# Patient Record
Sex: Male | Born: 1990 | Race: Black or African American | Hispanic: No | Marital: Single | Smoking: Current every day smoker
Health system: Southern US, Community
[De-identification: ages and names within clinical notes are randomized; demographics above are authoritative.]

## PROBLEM LIST (undated history)

## (undated) DIAGNOSIS — B019 Varicella without complication: Secondary | ICD-10-CM

## (undated) DIAGNOSIS — B20 Human immunodeficiency virus [HIV] disease: Secondary | ICD-10-CM

## (undated) DIAGNOSIS — A539 Syphilis, unspecified: Secondary | ICD-10-CM

## (undated) DIAGNOSIS — K603 Anal fistula, unspecified: Secondary | ICD-10-CM

## (undated) DIAGNOSIS — J45909 Unspecified asthma, uncomplicated: Secondary | ICD-10-CM

## (undated) DIAGNOSIS — K649 Unspecified hemorrhoids: Secondary | ICD-10-CM

## (undated) DIAGNOSIS — R040 Epistaxis: Secondary | ICD-10-CM

## (undated) DIAGNOSIS — Z21 Asymptomatic human immunodeficiency virus [HIV] infection status: Secondary | ICD-10-CM

## (undated) DIAGNOSIS — F411 Generalized anxiety disorder: Secondary | ICD-10-CM

## (undated) DIAGNOSIS — K602 Anal fissure, unspecified: Secondary | ICD-10-CM

## (undated) DIAGNOSIS — K219 Gastro-esophageal reflux disease without esophagitis: Secondary | ICD-10-CM

## (undated) DIAGNOSIS — I10 Essential (primary) hypertension: Secondary | ICD-10-CM

## (undated) DIAGNOSIS — K626 Ulcer of anus and rectum: Secondary | ICD-10-CM

## (undated) HISTORY — DX: Gastro-esophageal reflux disease without esophagitis: K21.9

## (undated) HISTORY — DX: Varicella without complication: B01.9

## (undated) HISTORY — DX: Epistaxis: R04.0

## (undated) HISTORY — DX: Human immunodeficiency virus (HIV) disease: B20

## (undated) HISTORY — DX: Essential (primary) hypertension: I10

## (undated) HISTORY — DX: Ulcer of anus and rectum: K62.6

## (undated) HISTORY — DX: Anal fistula, unspecified: K60.30

## (undated) HISTORY — DX: Unspecified asthma, uncomplicated: J45.909

## (undated) HISTORY — DX: Generalized anxiety disorder: F41.1

## (undated) HISTORY — DX: Anal fistula: K60.3

## (undated) HISTORY — DX: Asymptomatic human immunodeficiency virus (hiv) infection status: Z21

## (undated) HISTORY — DX: Anal fissure, unspecified: K60.2

## (undated) HISTORY — DX: Unspecified hemorrhoids: K64.9

## (undated) HISTORY — DX: Syphilis, unspecified: A53.9

---
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
## 2011-04-09 ENCOUNTER — Ambulatory Visit (INDEPENDENT_AMBULATORY_CARE_PROVIDER_SITE_OTHER): Payer: 59

## 2011-04-09 DIAGNOSIS — Z23 Encounter for immunization: Secondary | ICD-10-CM

## 2011-12-30 ENCOUNTER — Ambulatory Visit: Payer: 59 | Admitting: Internal Medicine

## 2011-12-30 DIAGNOSIS — Z0289 Encounter for other administrative examinations: Secondary | ICD-10-CM

## 2012-01-08 ENCOUNTER — Ambulatory Visit: Payer: 59 | Admitting: Internal Medicine

## 2012-01-08 DIAGNOSIS — Z0289 Encounter for other administrative examinations: Secondary | ICD-10-CM

## 2012-03-10 ENCOUNTER — Telehealth: Payer: Self-pay | Admitting: Internal Medicine

## 2012-03-10 ENCOUNTER — Encounter: Payer: Self-pay | Admitting: Internal Medicine

## 2012-03-10 ENCOUNTER — Ambulatory Visit (INDEPENDENT_AMBULATORY_CARE_PROVIDER_SITE_OTHER): Payer: 59 | Admitting: Internal Medicine

## 2012-03-10 VITALS — BP 170/100 | HR 155 | Temp 97.8°F | Ht 70.0 in | Wt 154.5 lb

## 2012-03-10 DIAGNOSIS — F411 Generalized anxiety disorder: Secondary | ICD-10-CM

## 2012-03-10 MED ORDER — SERTRALINE HCL 50 MG PO TABS: 50.0000 mg | ORAL_TABLET | Freq: Every day | ORAL | Status: DC

## 2012-03-10 NOTE — Telephone Encounter (Signed)
Caller Vernona Rieger for [employer]; DOB 15-Nov-1990;  PCP Illene Regulus;  Callback number [phone]; Onset of anxiety about 6-12 months ago and progressively getting worse. Patient states he gets physical symptoms of shortness of breath, chest pain, and dizziness.  Triggers include being in a car for a long time, in traffic, loud sounds, being late to something or rushed.  Is having some life stressors now.  States he is able to work his job and attend school.  Triaged using Anxiety: Panic with a disposition  to see provider within 24 hours due to experiencing multiple daily episodes of severe anxiety or panic.  Care advice given-appointment made with Dr. Debby Bud at 16:15 on 03/10/12.  Patient stated his understanding.

## 2012-03-10 NOTE — Patient Instructions (Addendum)
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 PLan Drugs - will start sertraline 50 mg once a day  Counseling - looking for triggers, causes and solutions for the long term. Call [phone] to see Dr. Dellia Cloud with whom you are established for some short term counseling.

## 2012-03-12 DIAGNOSIS — F411 Generalized anxiety disorder: Secondary | ICD-10-CM | POA: Insufficient documentation

## 2012-03-12 NOTE — Progress Notes (Signed)
  Subjective:    Patient ID: Harold Flores, male    DOB: January 18, 1991, 21 y.o.   MRN: [id number]  HPI [name] was last seen approximately two years ago. In the interval he has been in college and has been living independently. He presents today for progressive anxiety and progressively more frequent panic attacks- up to several times a day. He has classic symptoms: sweats and feeling hot, sense of impending doom. He can calm himself with deep breathing but not knowing when the next attack will occur is unnerving.  Past Med Hx, Fm Hx, Soc Hx reviewed in centricity.  Current Outpatient Prescriptions on File Prior to Visit  Medication Sig Dispense Refill  . sertraline (ZOLOFT) 50 MG tablet Take 1 tablet (50 mg total) by mouth daily.  30 tablet  3     Review of Systems System review is negative for any constitutional, cardiac, pulmonary, GI or neuro symptoms or complaints other than as described in the HPI.     Objective:   Physical Exam Filed Vitals:   03/10/12 1656  BP: 170/100  Pulse: 155  Temp: 97.8 F (36.6 C)   Gen'l- WNWD AA man in no distress but anxiou HEENT- C&S clear PERRLA Cor- RRR Pulm - normal respirations Psych- at exam he is oriented, calm, conversant.       Assessment & Plan:

## 2012-03-12 NOTE — Assessment & Plan Note (Signed)
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 PLan Drugs - will start sertraline 50 mg once a day  Counseling - looking for triggers, causes and solutions for the long term. Call [phone] to see Dr. Dellia Cloud with whom he is established for some short term counseling.   ROV 1 month for medication monitoring

## 2012-03-18 ENCOUNTER — Telehealth: Payer: Self-pay | Admitting: Internal Medicine

## 2012-03-18 NOTE — Telephone Encounter (Signed)
Caller: [name]/Patient; Patient Name: Harold Flores; PCP: Illene Regulus (Adults only); Best Callback Phone Number: [phone]; Reason for call: Patient states he came in last week 03/10/12 to see Dr. Debby Bud.   He was diagnosed with Generalized Anxiety and placed on Zoloft. He states in addition to being diagnosed , he has stopped smoking . No cigarette in the last 4 days.  He states he does feel better but the anxiety went from his arm pain a  "heart atttack" feeling and has now moved to his head . He feels like he has pressure in his head and fuzziness. Voice is clear, calm and purposeful. Emergent s/sx ruled out per Anxiety Protocol with excpetion to "Increasing physical Symptoms related to Anxiety" . See provider in 72 hours. Care advice and Guidelines reviewed with patient. Advised I would forward concerns to Dr. Debby Bud. Patient may need medication adjustment or change . Advised I would have office follow up . Understanding expressed. Encouraged to call back for questions, changes or concerns. DOES DR. Alvera Novel  WANT TO ADJUST MEDICATION OR SEE PATIENT???

## 2012-03-19 ENCOUNTER — Encounter: Payer: Self-pay | Admitting: Internal Medicine

## 2012-03-19 ENCOUNTER — Ambulatory Visit (INDEPENDENT_AMBULATORY_CARE_PROVIDER_SITE_OTHER): Payer: 59 | Admitting: Internal Medicine

## 2012-03-19 VITALS — BP 134/86 | HR 93 | Temp 98.9°F | Resp 16 | Wt 153.0 lb

## 2012-03-19 DIAGNOSIS — F411 Generalized anxiety disorder: Secondary | ICD-10-CM

## 2012-03-19 DIAGNOSIS — Z23 Encounter for immunization: Secondary | ICD-10-CM

## 2012-03-19 NOTE — Telephone Encounter (Signed)
Pt scheduled, thanks! 

## 2012-03-19 NOTE — Telephone Encounter (Signed)
May add on this PM 

## 2012-03-20 ENCOUNTER — Encounter: Payer: Self-pay | Admitting: *Deleted

## 2012-03-23 NOTE — Assessment & Plan Note (Signed)
Doing better on Zoloft. No evidence of any drug reaction.  Plan  Increase Zoloft to 100 mg daily.

## 2012-03-23 NOTE — Progress Notes (Signed)
  Subjective:    Patient ID: Harold Flores, male    DOB: 05/22/1991, 21 y.o.   MRN: [id number]  HPI [name] was recently seen for anxiety with panic and started on Zoloft 50 mg once a day. He reports that he has fewer panic attacks but does have a pressure feeling in his head associated with anxiety. He was asked to come in to be sure he was not having a drug reaction.  PMH, FamHx and SocHx reviewed for any changes and relevance.  Medication - reviewed   Review of Systems System review is negative for any constitutional, cardiac, pulmonary, GI or neuro symptoms or complaints other than as described in the HPI.     Objective:   Physical Exam Filed Vitals:   03/19/12 1713  BP: 134/86  Pulse: 93  Temp: 98.9 F (37.2 C)  Resp: 16   Cor- RRR Pulm - normal Neuro - non-focal       Assessment & Plan:

## 2012-03-30 ENCOUNTER — Other Ambulatory Visit: Payer: Self-pay | Admitting: *Deleted

## 2012-03-30 MED ORDER — SERTRALINE HCL 100 MG PO TABS: 100.0000 mg | ORAL_TABLET | Freq: Every day | ORAL | Status: DC

## 2012-03-30 NOTE — Telephone Encounter (Signed)
Medication change to Sertraline 100mg  daily sent to [facility]

## 2012-04-09 ENCOUNTER — Ambulatory Visit (INDEPENDENT_AMBULATORY_CARE_PROVIDER_SITE_OTHER): Payer: 59 | Admitting: Psychology

## 2012-04-09 DIAGNOSIS — F4320 Adjustment disorder, unspecified: Secondary | ICD-10-CM

## 2012-04-23 ENCOUNTER — Ambulatory Visit: Payer: Self-pay | Admitting: Psychology

## 2012-05-06 ENCOUNTER — Ambulatory Visit (INDEPENDENT_AMBULATORY_CARE_PROVIDER_SITE_OTHER): Payer: 59 | Admitting: Psychology

## 2012-05-06 DIAGNOSIS — F4320 Adjustment disorder, unspecified: Secondary | ICD-10-CM

## 2012-05-22 ENCOUNTER — Ambulatory Visit: Payer: Self-pay | Admitting: Psychology

## 2012-09-24 ENCOUNTER — Other Ambulatory Visit: Payer: Self-pay | Admitting: Internal Medicine

## 2012-09-24 NOTE — Telephone Encounter (Signed)
Patient states he has been using Prevadid Solutabs and was wondering if there is a RX that would be stronger and offer more relief.

## 2012-09-24 NOTE — Telephone Encounter (Signed)
OK Preacid Rx x 1 mo OV w/any provider next wk Thx

## 2012-09-25 NOTE — Telephone Encounter (Signed)
Patient advised that Rx for Prevacid 30mg  was sent to [facility].  He will call back next week for appointment.

## 2012-09-30 ENCOUNTER — Telehealth: Payer: Self-pay

## 2012-09-30 NOTE — Telephone Encounter (Signed)
Phone call to pt letting him know I spoke with [facility] giving the okay for the rx for Prevacid 30 mg capsules.

## 2012-10-08 ENCOUNTER — Encounter: Payer: Self-pay | Admitting: Internal Medicine

## 2012-10-08 ENCOUNTER — Other Ambulatory Visit (INDEPENDENT_AMBULATORY_CARE_PROVIDER_SITE_OTHER): Payer: 59

## 2012-10-08 ENCOUNTER — Ambulatory Visit (INDEPENDENT_AMBULATORY_CARE_PROVIDER_SITE_OTHER): Payer: 59 | Admitting: Internal Medicine

## 2012-10-08 VITALS — BP 150/86 | HR 98 | Temp 97.7°F | Resp 16 | Ht 70.0 in | Wt 165.0 lb

## 2012-10-08 DIAGNOSIS — R599 Enlarged lymph nodes, unspecified: Secondary | ICD-10-CM

## 2012-10-08 DIAGNOSIS — I10 Essential (primary) hypertension: Secondary | ICD-10-CM | POA: Insufficient documentation

## 2012-10-08 DIAGNOSIS — A630 Anogenital (venereal) warts: Secondary | ICD-10-CM

## 2012-10-08 DIAGNOSIS — Z23 Encounter for immunization: Secondary | ICD-10-CM

## 2012-10-08 DIAGNOSIS — F411 Generalized anxiety disorder: Secondary | ICD-10-CM

## 2012-10-08 DIAGNOSIS — A5139 Other secondary syphilis of skin: Secondary | ICD-10-CM

## 2012-10-08 DIAGNOSIS — J4530 Mild persistent asthma, uncomplicated: Secondary | ICD-10-CM

## 2012-10-08 DIAGNOSIS — Z0001 Encounter for general adult medical examination with abnormal findings: Secondary | ICD-10-CM | POA: Insufficient documentation

## 2012-10-08 DIAGNOSIS — Z0000 Encounter for general adult medical examination without abnormal findings: Secondary | ICD-10-CM | POA: Insufficient documentation

## 2012-10-08 DIAGNOSIS — R590 Localized enlarged lymph nodes: Secondary | ICD-10-CM

## 2012-10-08 DIAGNOSIS — J45909 Unspecified asthma, uncomplicated: Secondary | ICD-10-CM

## 2012-10-08 LAB — URINALYSIS, ROUTINE W REFLEX MICROSCOPIC
Bilirubin Urine: NEGATIVE
Hgb urine dipstick: NEGATIVE
Ketones, ur: NEGATIVE
Leukocytes, UA: NEGATIVE
Nitrite: NEGATIVE
Specific Gravity, Urine: 1.015 (ref 1.000–1.030)
Urine Glucose: NEGATIVE
Urobilinogen, UA: 1 (ref 0.0–1.0)
pH: 7.5 (ref 5.0–8.0)

## 2012-10-08 LAB — CBC WITH DIFFERENTIAL/PLATELET
Basophils Absolute: 0 10*3/uL (ref 0.0–0.1)
Basophils Relative: 0.5 % (ref 0.0–3.0)
Eosinophils Absolute: 0.2 10*3/uL (ref 0.0–0.7)
Eosinophils Relative: 2.7 % (ref 0.0–5.0)
HCT: 41.8 % (ref 39.0–52.0)
Hemoglobin: 14.1 g/dL (ref 13.0–17.0)
Lymphocytes Relative: 35.6 % (ref 12.0–46.0)
Lymphs Abs: 2.6 10*3/uL (ref 0.7–4.0)
MCHC: 33.6 g/dL (ref 30.0–36.0)
MCV: 77.8 fl — ABNORMAL LOW (ref 78.0–100.0)
Monocytes Absolute: 1.1 10*3/uL — ABNORMAL HIGH (ref 0.1–1.0)
Monocytes Relative: 14.7 % — ABNORMAL HIGH (ref 3.0–12.0)
Neutro Abs: 3.4 10*3/uL (ref 1.4–7.7)
Neutrophils Relative %: 46.5 % (ref 43.0–77.0)
Platelets: 295 10*3/uL (ref 150.0–400.0)
RBC: 5.37 Mil/uL (ref 4.22–5.81)
RDW: 14.6 % (ref 11.5–14.6)
WBC: 7.2 10*3/uL (ref 4.5–10.5)

## 2012-10-08 LAB — COMPREHENSIVE METABOLIC PANEL
ALT: 13 U/L (ref 0–53)
AST: 16 U/L (ref 0–37)
Albumin: 4.2 g/dL (ref 3.5–5.2)
Alkaline Phosphatase: 106 U/L (ref 39–117)
BUN: 8 mg/dL (ref 6–23)
CO2: 29 mEq/L (ref 19–32)
Calcium: 9.3 mg/dL (ref 8.4–10.5)
Chloride: 102 mEq/L (ref 96–112)
Creatinine, Ser: 0.8 mg/dL (ref 0.4–1.5)
GFR: 154.37 mL/min (ref >60.00)
Glucose, Bld: 96 mg/dL (ref 70–99)
Potassium: 3.9 mEq/L (ref 3.5–5.1)
Sodium: 136 mEq/L (ref 135–145)
Total Bilirubin: 1.1 mg/dL (ref 0.3–1.2)
Total Protein: 7.5 g/dL (ref 6.0–8.3)

## 2012-10-08 LAB — LIPID PANEL
Cholesterol: 116 mg/dL (ref 0–200)
HDL: 33.1 mg/dL — ABNORMAL LOW (ref >39.00)
LDL Cholesterol: 76 mg/dL (ref 0–99)
Total CHOL/HDL Ratio: 4
Triglycerides: 37 mg/dL (ref 0.0–149.0)
VLDL: 7.4 mg/dL (ref 0.0–40.0)

## 2012-10-08 LAB — RPR TITER: RPR Titer: 1:16 {titer} — AB

## 2012-10-08 LAB — TSH: TSH: 1.36 u[IU]/mL (ref 0.35–5.50)

## 2012-10-08 LAB — HIV ANTIBODY (ROUTINE TESTING W REFLEX): HIV: NONREACTIVE

## 2012-10-08 LAB — RPR: RPR Ser Ql: REACTIVE — AB

## 2012-10-08 MED ORDER — BECLOMETHASONE DIPROPIONATE 80 MCG/ACT IN AERS: 1.0000 | INHALATION_SPRAY | Freq: Two times a day (BID) | RESPIRATORY_TRACT | Status: DC

## 2012-10-08 MED ORDER — NEBIVOLOL HCL 5 MG PO TABS: 5.0000 mg | ORAL_TABLET | Freq: Every day | ORAL | Status: DC

## 2012-10-08 NOTE — Patient Instructions (Signed)
Hypertension As your heart beats, it forces blood through your arteries. This force is your blood pressure. If the pressure is too high, it is called hypertension (HTN) or high blood pressure. HTN is dangerous because you may have it and not know it. High blood pressure may mean that your heart has to work harder to pump blood. Your arteries may be narrow or stiff. The extra work puts you at risk for heart disease, stroke, and other problems.  Blood pressure consists of two numbers, a higher number over a lower, 110/72, for example. It is stated as "110 over 72." The ideal is below 120 for the top number (systolic) and under 80 for the bottom (diastolic). Write down your blood pressure today. You should pay close attention to your blood pressure if you have certain conditions such as:  Heart failure.  Prior heart attack.  Diabetes  Chronic kidney disease.  Prior stroke.  Multiple risk factors for heart disease. To see if you have HTN, your blood pressure should be measured while you are seated with your arm held at the level of the heart. It should be measured at least twice. A one-time elevated blood pressure reading (especially in the Emergency Department) does not mean that you need treatment. There may be conditions in which the blood pressure is different between your right and left arms. It is important to see your caregiver soon for a recheck. Most people have essential hypertension which means that there is not a specific cause. This type of high blood pressure may be lowered by changing lifestyle factors such as:  Stress.  Smoking.  Lack of exercise.  Excessive weight.  Drug/tobacco/alcohol use.  Eating less salt. Most people do not have symptoms from high blood pressure until it has caused damage to the body. Effective treatment can often prevent, delay or reduce that damage. TREATMENT  When a cause has been identified, treatment for high blood pressure is directed at the  cause. There are a large number of medications to treat HTN. These fall into several categories, and your caregiver will help you select the medicines that are best for you. Medications may have side effects. You should review side effects with your caregiver. If your blood pressure stays high after you have made lifestyle changes or started on medicines,   Your medication(s) may need to be changed.  Other problems may need to be addressed.  Be certain you understand your prescriptions, and know how and when to take your medicine.  Be sure to follow up with your caregiver within the time frame advised (usually within two weeks) to have your blood pressure rechecked and to review your medications.  If you are taking more than one medicine to lower your blood pressure, make sure you know how and at what times they should be taken. Taking two medicines at the same time can result in blood pressure that is too low. SEEK IMMEDIATE MEDICAL CARE IF:  You develop a severe headache, blurred or changing vision, or confusion.  You have unusual weakness or numbness, or a faint feeling.  You have severe chest or abdominal pain, vomiting, or breathing problems. MAKE SURE YOU:   Understand these instructions.  Will watch your condition.  Will get help right away if you are not doing well or get worse. Document Released: 05/27/2005 Document Revised: 08/19/2011 Document Reviewed: 01/15/2008 [employer] Patient Information 2013 [employer]. Health Maintenance, Males A healthy lifestyle and preventative care can promote health and wellness.  Maintain   regular health, dental, and eye exams.  Eat a healthy diet. Foods like vegetables, fruits, whole grains, low-fat dairy products, and lean protein foods contain the nutrients you need without too many calories. Decrease your intake of foods high in solid fats, added sugars, and salt. Get information about a proper diet from your caregiver, if  necessary.  Regular physical exercise is one of the most important things you can do for your health. Most adults should get at least 150 minutes of moderate-intensity exercise (any activity that increases your heart rate and causes you to sweat) each week. In addition, most adults need muscle-strengthening exercises on 2 or more days a week.   Maintain a healthy weight. The body mass index (BMI) is a screening tool to identify possible weight problems. It provides an estimate of body fat based on height and weight. Your caregiver can help determine your BMI, and can help you achieve or maintain a healthy weight. For adults 20 years and older:  A BMI below 18.5 is considered underweight.  A BMI of 18.5 to 24.9 is normal.  A BMI of 25 to 29.9 is considered overweight.  A BMI of 30 and above is considered obese.  Maintain normal blood lipids and cholesterol by exercising and minimizing your intake of saturated fat. Eat a balanced diet with plenty of fruits and vegetables. Blood tests for lipids and cholesterol should begin at age 20 and be repeated every 5 years. If your lipid or cholesterol levels are high, you are over 50, or you are a high risk for heart disease, you may need your cholesterol levels checked more frequently.Ongoing high lipid and cholesterol levels should be treated with medicines, if diet and exercise are not effective.  If you smoke, find out from your caregiver how to quit. If you do not use tobacco, do not start.  If you choose to drink alcohol, do not exceed 2 drinks per day. One drink is considered to be 12 ounces (355 mL) of beer, 5 ounces (148 mL) of wine, or 1.5 ounces (44 mL) of liquor.  Avoid use of street drugs. Do not share needles with anyone. Ask for help if you need support or instructions about stopping the use of drugs.  High blood pressure causes heart disease and increases the risk of stroke. Blood pressure should be checked at least every 1 to 2 years.  Ongoing high blood pressure should be treated with medicines if weight loss and exercise are not effective.  If you are 45 to 21 years old, ask your caregiver if you should take aspirin to prevent heart disease.  Diabetes screening involves taking a blood sample to check your fasting blood sugar level. This should be done once every 3 years, after age 45, if you are within normal weight and without risk factors for diabetes. Testing should be considered at a younger age or be carried out more frequently if you are overweight and have at least 1 risk factor for diabetes.  Colorectal cancer can be detected and often prevented. Most routine colorectal cancer screening begins at the age of 50 and continues through age 75. However, your caregiver may recommend screening at an earlier age if you have risk factors for colon cancer. On a yearly basis, your caregiver may provide home test kits to check for hidden blood in the stool. Use of a small camera at the end of a tube, to directly examine the colon (sigmoidoscopy or colonoscopy), can detect the earliest forms of colorectal   cancer. Talk to your caregiver about this at age 50, when routine screening begins. Direct examination of the colon should be repeated every 5 to 10 years through age 75, unless early forms of pre-cancerous polyps or small growths are found.  Hepatitis C blood testing is recommended for all people born from 1945 through 1965 and any individual with known risks for hepatitis C.  Healthy men should no longer receive prostate-specific antigen (PSA) blood tests as part of routine cancer screening. Consult with your caregiver about prostate cancer screening.  Testicular cancer screening is not recommended for adolescents or adult males who have no symptoms. Screening includes self-exam, caregiver exam, and other screening tests. Consult with your caregiver about any symptoms you have or any concerns you have about testicular  cancer.  Practice safe sex. Use condoms and avoid high-risk sexual practices to reduce the spread of sexually transmitted infections (STIs).  Use sunscreen with a sun protection factor (SPF) of 30 or greater. Apply sunscreen liberally and repeatedly throughout the day. You should seek shade when your shadow is shorter than you. Protect yourself by wearing long sleeves, pants, a wide-brimmed hat, and sunglasses year round, whenever you are outdoors.  Notify your caregiver of new moles or changes in moles, especially if there is a change in shape or color. Also notify your caregiver if a mole is larger than the size of a pencil eraser.  A one-time screening for abdominal aortic aneurysm (AAA) and surgical repair of large AAAs by sound wave imaging (ultrasonography) is recommended for ages 65 to 75 years who are current or former smokers.  Stay current with your immunizations. Document Released: 11/23/2007 Document Revised: 08/19/2011 Document Reviewed: 10/22/2010 [employer] Patient Information 2013 [employer].  

## 2012-10-08 NOTE — Progress Notes (Signed)
Subjective:    Patient ID: Harold Flores, male    DOB: 11/02/1990, 22 y.o.   MRN: [id number]  Hypertension This is a new problem. The problem is unchanged. Associated symptoms include anxiety. Pertinent negatives include no blurred vision, chest pain, headaches, malaise/fatigue, neck pain, orthopnea, palpitations, peripheral edema, PND, shortness of breath or sweats. There are no associated agents to hypertension. Past treatments include nothing.  Asthma He complains of chest tightness and wheezing. There is no cough, difficulty breathing, frequent throat clearing, hemoptysis, hoarse voice, shortness of breath or sputum production. This is a recurrent problem. The current episode started more than 1 year ago. The problem occurs intermittently. The problem has been unchanged. Associated symptoms include heartburn. Pertinent negatives include no appetite change, chest pain, dyspnea on exertion, ear congestion, ear pain, fever, headaches, malaise/fatigue, myalgias, nasal congestion, orthopnea, PND, postnasal drip, rhinorrhea, sneezing, sore throat, sweats, trouble swallowing or weight loss. His symptoms are alleviated by beta-agonist. He reports significant improvement on treatment. His past medical history is significant for asthma.      Review of Systems  Constitutional: Negative.  Negative for fever, chills, weight loss, malaise/fatigue, diaphoresis, activity change, appetite change, fatigue and unexpected weight change.  HENT: Negative.  Negative for ear pain, sore throat, hoarse voice, rhinorrhea, sneezing, trouble swallowing, neck pain and postnasal drip.   Eyes: Negative.  Negative for blurred vision.  Respiratory: Positive for wheezing. Negative for apnea, cough, hemoptysis, sputum production, choking, chest tightness, shortness of breath and stridor.   Cardiovascular: Negative for chest pain, dyspnea on exertion, palpitations, orthopnea, leg swelling and PND.  Gastrointestinal: Positive  for heartburn. Negative for nausea, vomiting, abdominal pain, diarrhea and constipation.  Endocrine: Negative.   Genitourinary: Negative.  Negative for dysuria, urgency, decreased urine volume, scrotal swelling, difficulty urinating and testicular pain.  Musculoskeletal: Negative.  Negative for myalgias, back pain, joint swelling, arthralgias and gait problem.  Skin: Negative.   Allergic/Immunologic: Negative.  Negative for environmental allergies, food allergies and immunocompromised state.  Neurological: Negative.  Negative for headaches.  Hematological: Negative.  Negative for adenopathy. Does not bruise/bleed easily.  Psychiatric/Behavioral: Negative for suicidal ideas, hallucinations, behavioral problems, confusion, sleep disturbance, self-injury, dysphoric mood, decreased concentration and agitation. The patient is nervous/anxious. The patient is not hyperactive.        Objective:   Physical Exam  Vitals reviewed. Constitutional: He is oriented to person, place, and time. He appears well-developed and well-nourished. No distress.  HENT:  Head: Normocephalic and atraumatic.  Mouth/Throat: Oropharynx is clear and moist. No oropharyngeal exudate.  Eyes: Conjunctivae are normal. Right eye exhibits no discharge. Left eye exhibits no discharge. No scleral icterus.  Neck: Normal range of motion. Neck supple. No JVD present. No tracheal deviation present. No thyromegaly present.  Cardiovascular: Normal rate, regular rhythm, normal heart sounds and intact distal pulses.  Exam reveals no gallop and no friction rub.   No murmur heard. Pulmonary/Chest: Effort normal and breath sounds normal. No stridor. No respiratory distress. He has no wheezes. He has no rales. He exhibits no tenderness.  Abdominal: Soft. Bowel sounds are normal. He exhibits no distension and no mass. There is no tenderness. There is no rebound and no guarding. Hernia confirmed negative in the right inguinal area and confirmed  negative in the left inguinal area.  Genitourinary: Penis normal. Right testis shows no mass, no swelling and no tenderness. Right testis is descended. Left testis shows no mass, no swelling and no tenderness. Left testis is descended. Circumcised. No penile  erythema or penile tenderness. No discharge found.  Musculoskeletal: Normal range of motion. He exhibits no edema and no tenderness.  Lymphadenopathy:    He has no cervical adenopathy.       Right: Inguinal (shoddy, insignificant) adenopathy present.       Left: Inguinal (shoddy,insignificant) adenopathy present.  Neurological: He is oriented to person, place, and time.  Skin: Skin is warm and dry. Rash (macules on his palms) noted. He is not diaphoretic. No erythema. No pallor.  Psychiatric: He has a normal mood and affect. His behavior is normal. Judgment and thought content normal.     Lab Results  Component Value Date   WBC 23.6 Repeated and verified X2. K/uL* 03/08/2010   HGB 12.8* 03/08/2010   HCT 38.2* 03/08/2010   PLT 347.0 03/08/2010   GLUCOSE 112* 03/08/2010   NA 138 03/08/2010   K 5.6* 03/08/2010   CL 97 03/08/2010   CREATININE 1.0 03/08/2010   BUN 9 03/08/2010   CO2 31 03/08/2010   HGBA1C 5.6 06/12/2009       Assessment & Plan:

## 2012-10-09 ENCOUNTER — Telehealth: Payer: Self-pay

## 2012-10-09 ENCOUNTER — Ambulatory Visit: Payer: 59

## 2012-10-09 ENCOUNTER — Ambulatory Visit (INDEPENDENT_AMBULATORY_CARE_PROVIDER_SITE_OTHER): Payer: 59 | Admitting: Internal Medicine

## 2012-10-09 ENCOUNTER — Encounter: Payer: Self-pay | Admitting: Internal Medicine

## 2012-10-09 VITALS — BP 120/80 | HR 64 | Temp 98.4°F | Resp 16 | Wt 165.0 lb

## 2012-10-09 DIAGNOSIS — A515 Early syphilis, latent: Secondary | ICD-10-CM

## 2012-10-09 DIAGNOSIS — A530 Latent syphilis, unspecified as early or late: Secondary | ICD-10-CM | POA: Insufficient documentation

## 2012-10-09 LAB — T.PALLIDUM AB, TOTAL: T pallidum Antibodies (TP-PA): >8 S/CO — ABNORMAL HIGH (ref <0.90)

## 2012-10-09 LAB — GC/CHLAMYDIA PROBE AMP, URINE
Chlamydia, Swab/Urine, PCR: NEGATIVE
GC Probe Amp, Urine: NEGATIVE

## 2012-10-09 MED ORDER — PENICILLIN G BENZATHINE 2400000 UNIT/4ML IM SUSP
2.4000 [IU] | Freq: Once | INTRAMUSCULAR | Status: AC
  Administered 2012-10-09: 2.4 [IU] via INTRAMUSCULAR

## 2012-10-09 NOTE — Assessment & Plan Note (Signed)
Exam done Vaccines were updated Labs ordered Pt ed material was given 

## 2012-10-09 NOTE — Assessment & Plan Note (Signed)
Labs ordered.

## 2012-10-09 NOTE — Assessment & Plan Note (Signed)
Start QVAR

## 2012-10-09 NOTE — Assessment & Plan Note (Signed)
I will check his labs today to look for secondary causes and end organ damage He will start bystolic to lower the BP

## 2012-10-09 NOTE — Progress Notes (Signed)
  Subjective:    Patient ID: Harold Flores, male    DOB: 12-12-1990, 22 y.o.   MRN: [id number]  Rash This is a new problem. The current episode started in the past 7 days. The problem is unchanged. Pain location: palms. The rash is characterized by redness. Pertinent negatives include no anorexia, congestion, cough, diarrhea, eye pain, facial edema, fatigue, fever, joint pain, nail changes, rhinorrhea, shortness of breath, sore throat or vomiting. Past treatments include nothing.      Review of Systems  Constitutional: Negative.  Negative for fever, chills and fatigue.  HENT: Negative.  Negative for congestion, sore throat and rhinorrhea.   Eyes: Negative.  Negative for pain.  Respiratory: Negative for cough, choking, shortness of breath, wheezing and stridor.   Cardiovascular: Negative.  Negative for chest pain, palpitations and leg swelling.  Gastrointestinal: Negative.  Negative for vomiting, diarrhea and anorexia.  Endocrine: Negative.   Genitourinary: Negative.   Musculoskeletal: Negative.  Negative for joint pain.  Skin: Positive for rash. Negative for nail changes.  Allergic/Immunologic: Negative.   Neurological: Negative.   Hematological: Negative for adenopathy. Does not bruise/bleed easily.  Psychiatric/Behavioral: Negative.        Objective:   Physical Exam  Vitals reviewed. Constitutional: He is oriented to person, place, and time. He appears well-developed and well-nourished. No distress.  HENT:  Head: Normocephalic and atraumatic.  Mouth/Throat: Oropharynx is clear and moist. No oropharyngeal exudate.  Eyes: Conjunctivae are normal. Right eye exhibits no discharge. Left eye exhibits no discharge. No scleral icterus.  Neck: Normal range of motion. Neck supple. No JVD present. No tracheal deviation present. No thyromegaly present.  Cardiovascular: Normal rate, regular rhythm, normal heart sounds and intact distal pulses.  Exam reveals no gallop and no friction rub.    No murmur heard. Pulmonary/Chest: Effort normal and breath sounds normal. No stridor. No respiratory distress. He has no wheezes. He has no rales. He exhibits no tenderness.  Abdominal: Soft. Bowel sounds are normal. He exhibits no distension and no mass. There is no tenderness. There is no rebound and no guarding.  Musculoskeletal: Normal range of motion. He exhibits no tenderness.  Lymphadenopathy:    He has no cervical adenopathy.  Neurological: He is oriented to person, place, and time.  Skin: Skin is warm, dry and intact. Rash noted. No purpura noted. Rash is macular. Rash is not papular, not maculopapular, not nodular, not pustular, not vesicular and not urticarial. He is not diaphoretic. No pallor.  He has hyperpigmented macules on his palms  Psychiatric: He has a normal mood and affect. His behavior is normal. Judgment and thought content normal.     Lab Results  Component Value Date   WBC 7.2 10/08/2012   HGB 14.1 10/08/2012   HCT 41.8 10/08/2012   PLT 295.0 10/08/2012   GLUCOSE 96 10/08/2012   CHOL 116 10/08/2012   TRIG 37.0 10/08/2012   HDL 33.10* 10/08/2012   LDLCALC 76 10/08/2012   ALT 13 10/08/2012   AST 16 10/08/2012   NA 136 10/08/2012   K 3.9 10/08/2012   CL 102 10/08/2012   CREATININE 0.8 10/08/2012   BUN 8 10/08/2012   CO2 29 10/08/2012   TSH 1.36 10/08/2012   HGBA1C 5.6 06/12/2009       Assessment & Plan:

## 2012-10-09 NOTE — Assessment & Plan Note (Signed)
Will start treating today with Bicillin LA He will receive 2.4 million units IM q week for 3 weeks

## 2012-10-09 NOTE — Telephone Encounter (Signed)
Opened in error

## 2012-10-09 NOTE — Assessment & Plan Note (Signed)
He was notified and will RTC to start Bicillin

## 2012-10-09 NOTE — Assessment & Plan Note (Signed)
-  Continue zoloft  

## 2012-10-09 NOTE — Patient Instructions (Signed)
Syphilis, Females and Males Syphilis is an infection. Syphilis can be treated with medicines that kill germs (antibiotics). It is necessary that all your sexual partners also be tested for infection and possibly be treated. CAUSES  Syphilis is caused by a germ (bacteria) called Treponema pallidum. This infection is most commonly spread by sexual contact. The bacteria from an infected person is passed to another person through minor cuts or scrapes in the skin or mucous membranes. This infection may also be passed to a fetus through the blood of the pregnant and infected mother. This is very serious. It can cause deformities and may be life-threatening to the newborn. SYMPTOMS  Syphilis has 3 symptom stages:  Primary syphilis. The first stage involves painless sores (chancres) in and around the genital organs and mouth. Lymph nodes near the sores may also be swollen. Both the sores and swollen lymph nodes can disappear even if you are not treated. However, you will still have the infection in your body and can pass it to another person.  Secondary syphilis. The second stage involves a rash or sores over any portion of the body, including the palms of the hands and soles of the feet. Other symptoms commonly occur, including fever, new sores in the mouth or on the genitals, feeling generally ill, and having pain in the joints. These symptoms can also disappear with no treatment. However, you will still have the infection and can pass it to another person.  Tertiary syphilis. The third stage of this illness involves major and severe damage to different organs in the body. This includes damage to the brain leading to dementia and damage to the spinal cord leading to difficulty walking. It also includes damage to the heart and aorta. This may cause heart failure, fainting, or development of an enlargement (aneurysm) of the aorta. At any time after infection, it is possible to have no symptoms or signs of this  disease. This is called latent syphilis. The only way to prove the presence of latent syphilis is by testing your blood for the infection. DIAGNOSIS  Depending on the stage of illness, an exam may show nothing or it may show the various symptoms associated with each stage.  During all stages of this infection, blood tests are used to confirm the diagnosis.  In the first and second stages, a fluid (drainage) sample from a sore or rash can be examined under a microscope to detect the bacteria.  Sometimes, fluid around the spine needs to be examined to detect brain damage or inflammation of the brain lining (meningitis).  In the third stage, X-rays, computerized X-ray scans (CT or CAT scans), computerized magnetic scans (MRIs), or ultrasounds may also be done to detect disease of the heart, aorta, or brain. TREATMENT   Antibiotics are used to treat all stages of syphilis. During the first day of your treatment you may experience fever, chills, headache, nausea, or aching all over your body. This is a normal reaction to the medicine.  Syphilis can be cured if the diagnosis is made early and the correct antibiotic is given.  Notify your recent sexual partners so they will go for testing as well as possible treatment.  Do not engage in sexual activity until your caregiver tells you the infection has been cured.  Testing and treatment for other sexually transmitted diseases (STDs) may be done when you are diagnosed with syphilis. Syphilis is an STD. You are also at risk for other STDs, which are often transmitted around  the same time as syphilis. These include:  Chlamydia.  Gonorrhea.  Trichomonas.  Human papilloma virus (HPV).  Human immunodeficiency virus (HIV).  If you are pregnant, have syphilis, and are not treated, it can cause deformities and can be life-threatening to the baby. HOME CARE INSTRUCTIONS   Finish all medicine as prescribed. Incomplete treatment will put you at risk  for continued infection and could be life-threatening.  Only take over-the-counter or prescription medicines for pain, discomfort, or fever as directed by your caregiver.  Do not have sex until treatment is completed, or as instructed by your caregiver.  Follow up with your caregiver as directed.  If your diagnosis of syphilis is confirmed, inform your recent sexual partners. They need to seek care and treatment, even if they have no symptoms. Finding out the results of your test Not all test results are available during your visit. If your test results are not back during the visit, make an appointment with your caregiver to find out the results. Do not assume everything is normal if you have not heard from your caregiver or the medical facility. It is important for you to follow up on all of your test results. SEEK MEDICAL CARE IF:   You develop any bad reaction to the medicine you were prescribed. This may include:  An oral temperature above 102 F (38.9 C).  Chills.  Headache.  Feeling lightheaded.  A new rash. MAKE SURE YOU:   Understand these instructions.  Will watch your condition.  Will get help right away if you are not doing well or get worse. Document Released: 11/20/2000 Document Revised: 08/19/2011 Document Reviewed: 09/27/2009 [facility] Patient Information 2013 [place], [place].

## 2012-10-15 ENCOUNTER — Telehealth: Payer: Self-pay | Admitting: *Deleted

## 2012-10-15 NOTE — Telephone Encounter (Signed)
Please work him in to my schedule tomorrow

## 2012-10-15 NOTE — Telephone Encounter (Signed)
Left msg on triage stating saw md last week. Was given injection and was told he need to have another one. Wanting to know when he need to come back in...Raechel Chute

## 2012-10-15 NOTE — Telephone Encounter (Signed)
Notified pt with md response. Transferred to schedulers to make appt for tomorrow,,,lmb

## 2012-10-16 ENCOUNTER — Ambulatory Visit (INDEPENDENT_AMBULATORY_CARE_PROVIDER_SITE_OTHER): Payer: 59 | Admitting: Internal Medicine

## 2012-10-16 ENCOUNTER — Other Ambulatory Visit: Payer: Self-pay | Admitting: Internal Medicine

## 2012-10-16 ENCOUNTER — Encounter: Payer: Self-pay | Admitting: Internal Medicine

## 2012-10-16 VITALS — BP 138/84 | HR 61 | Temp 98.9°F | Resp 16 | Wt 170.2 lb

## 2012-10-16 DIAGNOSIS — A5139 Other secondary syphilis of skin: Secondary | ICD-10-CM

## 2012-10-16 MED ORDER — PENICILLIN G BENZATHINE 1200000 UNIT/2ML IM SUSP
2.4000 10*6.[IU] | Freq: Once | INTRAMUSCULAR | Status: AC
  Administered 2012-10-16: 2.4 10*6.[IU] via INTRAMUSCULAR

## 2012-10-16 NOTE — Patient Instructions (Signed)
Syphilis, Females and Males Syphilis is an infection. Syphilis can be treated with medicines that kill germs (antibiotics). It is necessary that all your sexual partners also be tested for infection and possibly be treated. CAUSES  Syphilis is caused by a germ (bacteria) called Treponema pallidum. This infection is most commonly spread by sexual contact. The bacteria from an infected person is passed to another person through minor cuts or scrapes in the skin or mucous membranes. This infection may also be passed to a fetus through the blood of the pregnant and infected mother. This is very serious. It can cause deformities and may be life-threatening to the newborn. SYMPTOMS  Syphilis has 3 symptom stages:  Primary syphilis. The first stage involves painless sores (chancres) in and around the genital organs and mouth. Lymph nodes near the sores may also be swollen. Both the sores and swollen lymph nodes can disappear even if you are not treated. However, you will still have the infection in your body and can pass it to another person.  Secondary syphilis. The second stage involves a rash or sores over any portion of the body, including the palms of the hands and soles of the feet. Other symptoms commonly occur, including fever, new sores in the mouth or on the genitals, feeling generally ill, and having pain in the joints. These symptoms can also disappear with no treatment. However, you will still have the infection and can pass it to another person.  Tertiary syphilis. The third stage of this illness involves major and severe damage to different organs in the body. This includes damage to the brain leading to dementia and damage to the spinal cord leading to difficulty walking. It also includes damage to the heart and aorta. This may cause heart failure, fainting, or development of an enlargement (aneurysm) of the aorta. At any time after infection, it is possible to have no symptoms or signs of this  disease. This is called latent syphilis. The only way to prove the presence of latent syphilis is by testing your blood for the infection. DIAGNOSIS  Depending on the stage of illness, an exam may show nothing or it may show the various symptoms associated with each stage.  During all stages of this infection, blood tests are used to confirm the diagnosis.  In the first and second stages, a fluid (drainage) sample from a sore or rash can be examined under a microscope to detect the bacteria.  Sometimes, fluid around the spine needs to be examined to detect brain damage or inflammation of the brain lining (meningitis).  In the third stage, X-rays, computerized X-ray scans (CT or CAT scans), computerized magnetic scans (MRIs), or ultrasounds may also be done to detect disease of the heart, aorta, or brain. TREATMENT   Antibiotics are used to treat all stages of syphilis. During the first day of your treatment you may experience fever, chills, headache, nausea, or aching all over your body. This is a normal reaction to the medicine.  Syphilis can be cured if the diagnosis is made early and the correct antibiotic is given.  Notify your recent sexual partners so they will go for testing as well as possible treatment.  Do not engage in sexual activity until your caregiver tells you the infection has been cured.  Testing and treatment for other sexually transmitted diseases (STDs) may be done when you are diagnosed with syphilis. Syphilis is an STD. You are also at risk for other STDs, which are often transmitted around   the same time as syphilis. These include:  Chlamydia.  Gonorrhea.  Trichomonas.  Human papilloma virus (HPV).  Human immunodeficiency virus (HIV).  If you are pregnant, have syphilis, and are not treated, it can cause deformities and can be life-threatening to the baby. HOME CARE INSTRUCTIONS   Finish all medicine as prescribed. Incomplete treatment will put you at risk  for continued infection and could be life-threatening.  Only take over-the-counter or prescription medicines for pain, discomfort, or fever as directed by your caregiver.  Do not have sex until treatment is completed, or as instructed by your caregiver.  Follow up with your caregiver as directed.  If your diagnosis of syphilis is confirmed, inform your recent sexual partners. They need to seek care and treatment, even if they have no symptoms. Finding out the results of your test Not all test results are available during your visit. If your test results are not back during the visit, make an appointment with your caregiver to find out the results. Do not assume everything is normal if you have not heard from your caregiver or the medical facility. It is important for you to follow up on all of your test results. SEEK MEDICAL CARE IF:   You develop any bad reaction to the medicine you were prescribed. This may include:  An oral temperature above 102 F (38.9 C).  Chills.  Headache.  Feeling lightheaded.  A new rash. MAKE SURE YOU:   Understand these instructions.  Will watch your condition.  Will get help right away if you are not doing well or get worse. Document Released: 11/20/2000 Document Revised: 08/19/2011 Document Reviewed: 09/27/2009 Exit[employer] Patient Information 2013 [employer].  

## 2012-10-18 ENCOUNTER — Encounter: Payer: Self-pay | Admitting: Internal Medicine

## 2012-10-18 NOTE — Progress Notes (Signed)
  Subjective:    Patient ID: Harold Flores, male    DOB: Mar 05, 1991, 22 y.o.   MRN: [id number]  HPI  He returns for Bicillin injection #2, he feels well today and tells me that the rash on his hands is resolving.  Review of Systems  Constitutional: Negative.  Negative for fever, chills, diaphoresis, appetite change and fatigue.  HENT: Negative.   Eyes: Negative.   Respiratory: Negative.  Negative for cough.   Cardiovascular: Negative.   Gastrointestinal: Negative.  Negative for nausea, abdominal pain and diarrhea.  Endocrine: Negative.   Genitourinary: Negative.   Musculoskeletal: Negative.  Negative for myalgias and arthralgias.  Skin: Positive for rash. Negative for color change and wound.  Allergic/Immunologic: Negative.   Neurological: Negative.  Negative for dizziness, tremors, weakness and light-headedness.  Hematological: Negative.   Psychiatric/Behavioral: Negative.        Objective:   Physical Exam  Vitals reviewed. Constitutional: He is oriented to person, place, and time. He appears well-developed and well-nourished. No distress.  HENT:  Head: Normocephalic and atraumatic.  Mouth/Throat: Oropharynx is clear and moist. No oropharyngeal exudate.  Eyes: Conjunctivae are normal. Right eye exhibits no discharge. Left eye exhibits no discharge. No scleral icterus.  Neck: Normal range of motion. Neck supple. No JVD present. No tracheal deviation present. No thyromegaly present.  Cardiovascular: Normal rate, regular rhythm, normal heart sounds and intact distal pulses.  Exam reveals no gallop and no friction rub.   No murmur heard. Pulmonary/Chest: Effort normal and breath sounds normal. No stridor. No respiratory distress. He has no wheezes. He has no rales. He exhibits no tenderness.  Abdominal: Soft. Bowel sounds are normal. He exhibits no distension and no mass. There is no tenderness. There is no rebound and no guarding.  Lymphadenopathy:    He has no cervical  adenopathy.  Neurological: He is oriented to person, place, and time.  Skin: Skin is warm, dry and intact. Rash noted. No purpura noted. Rash is macular. Rash is not papular, not maculopapular, not nodular, not pustular, not vesicular and not urticarial. He is not diaphoretic. No pallor.  Hyperpigmented palmar lesions noted          Assessment & Plan:

## 2012-10-18 NOTE — Assessment & Plan Note (Signed)
RTC in one week for dose #3

## 2012-10-23 ENCOUNTER — Ambulatory Visit (INDEPENDENT_AMBULATORY_CARE_PROVIDER_SITE_OTHER): Payer: 59 | Admitting: *Deleted

## 2012-10-23 DIAGNOSIS — A5139 Other secondary syphilis of skin: Secondary | ICD-10-CM

## 2012-10-23 MED ORDER — PENICILLIN G BENZATHINE 1200000 UNIT/2ML IM SUSP
2.4000 10*6.[IU] | Freq: Once | INTRAMUSCULAR | Status: AC
  Administered 2012-10-23: 2.4 10*6.[IU] via INTRAMUSCULAR

## 2012-10-23 MED ORDER — PENICILLIN G BENZATHINE 1200000 UNIT/2ML IM SUSP: 2.4000 10*6.[IU] | Freq: Once | INTRAMUSCULAR | Status: DC

## 2012-11-04 ENCOUNTER — Other Ambulatory Visit: Payer: Self-pay | Admitting: Internal Medicine

## 2013-02-27 ENCOUNTER — Other Ambulatory Visit: Payer: Self-pay | Admitting: Internal Medicine

## 2013-03-02 NOTE — Telephone Encounter (Deleted)
Please forward to Dr. Roena Malady

## 2013-08-21 ENCOUNTER — Other Ambulatory Visit: Payer: Self-pay | Admitting: Internal Medicine

## 2014-03-05 ENCOUNTER — Encounter (HOSPITAL_COMMUNITY): Payer: Self-pay | Admitting: Emergency Medicine

## 2014-03-05 ENCOUNTER — Emergency Department (HOSPITAL_COMMUNITY)
Admission: EM | Admit: 2014-03-05 | Discharge: 2014-03-05 | Disposition: A | Discharge status: Home / Self Care | Payer: 59 | Attending: Emergency Medicine | Admitting: Emergency Medicine

## 2014-03-05 DIAGNOSIS — J45909 Unspecified asthma, uncomplicated: Secondary | ICD-10-CM | POA: Insufficient documentation

## 2014-03-05 DIAGNOSIS — R369 Urethral discharge, unspecified: Secondary | ICD-10-CM | POA: Insufficient documentation

## 2014-03-05 DIAGNOSIS — Z87891 Personal history of nicotine dependence: Secondary | ICD-10-CM | POA: Insufficient documentation

## 2014-03-05 DIAGNOSIS — K219 Gastro-esophageal reflux disease without esophagitis: Secondary | ICD-10-CM | POA: Insufficient documentation

## 2014-03-05 DIAGNOSIS — N390 Urinary tract infection, site not specified: Secondary | ICD-10-CM | POA: Insufficient documentation

## 2014-03-05 DIAGNOSIS — IMO0002 Reserved for concepts with insufficient information to code with codable children: Secondary | ICD-10-CM | POA: Insufficient documentation

## 2014-03-05 DIAGNOSIS — Z8619 Personal history of other infectious and parasitic diseases: Secondary | ICD-10-CM | POA: Insufficient documentation

## 2014-03-05 DIAGNOSIS — Z79899 Other long term (current) drug therapy: Secondary | ICD-10-CM | POA: Insufficient documentation

## 2014-03-05 LAB — URINALYSIS, ROUTINE W REFLEX MICROSCOPIC
Glucose, UA: NEGATIVE mg/dL
Ketones, ur: 40 mg/dL — AB
Nitrite: POSITIVE — AB
Protein, ur: >300 mg/dL — AB
Specific Gravity, Urine: 1.017 (ref 1.005–1.030)
Urobilinogen, UA: 1 mg/dL (ref 0.0–1.0)
pH: 5.5 (ref 5.0–8.0)

## 2014-03-05 LAB — I-STAT CHEM 8, ED
BUN: 3 mg/dL — ABNORMAL LOW (ref 6–23)
Calcium, Ion: 1.06 mmol/L — ABNORMAL LOW (ref 1.12–1.23)
Chloride: 103 mEq/L (ref 96–112)
Creatinine, Ser: 0.9 mg/dL (ref 0.50–1.35)
Glucose, Bld: 116 mg/dL — ABNORMAL HIGH (ref 70–99)
HCT: 45 % (ref 39.0–52.0)
Hemoglobin: 15.3 g/dL (ref 13.0–17.0)
Potassium: 3 mEq/L — ABNORMAL LOW (ref 3.7–5.3)
Sodium: 141 mEq/L (ref 137–147)
TCO2: 25 mmol/L (ref 0–100)

## 2014-03-05 LAB — URINE MICROSCOPIC-ADD ON

## 2014-03-05 MED ORDER — CEPHALEXIN 500 MG PO CAPS: 500.0000 mg | ORAL_CAPSULE | Freq: Four times a day (QID) | ORAL | Status: DC

## 2014-03-05 MED ORDER — POTASSIUM CHLORIDE CRYS ER 20 MEQ PO TBCR
60.0000 meq | EXTENDED_RELEASE_TABLET | Freq: Once | ORAL | Status: AC
  Administered 2014-03-05: 60 meq via ORAL
  Filled 2014-03-05: qty 3

## 2014-03-05 MED ORDER — SODIUM CHLORIDE 0.9 % IV BOLUS (SEPSIS)
1000.0000 mL | Freq: Once | INTRAVENOUS | Status: AC
  Administered 2014-03-05: 1000 mL via INTRAVENOUS

## 2014-03-05 MED ORDER — METRONIDAZOLE 500 MG PO TABS
2000.0000 mg | ORAL_TABLET | Freq: Once | ORAL | Status: AC
  Administered 2014-03-05: 2000 mg via ORAL
  Filled 2014-03-05: qty 4

## 2014-03-05 MED ORDER — CEFIXIME 400 MG PO CAPS
400.0000 mg | ORAL_CAPSULE | Freq: Once | ORAL | Status: AC
  Administered 2014-03-05: 400 mg via ORAL
  Filled 2014-03-05: qty 1

## 2014-03-05 MED ORDER — AZITHROMYCIN 250 MG PO TABS
1000.0000 mg | ORAL_TABLET | Freq: Once | ORAL | Status: AC
  Administered 2014-03-05: 1000 mg via ORAL
  Filled 2014-03-05: qty 4

## 2014-03-05 MED ORDER — CEFIXIME 400 MG PO TABS
400.0000 mg | ORAL_TABLET | Freq: Once | ORAL | Status: DC
  Filled 2014-03-05: qty 1

## 2014-03-05 MED ORDER — LORAZEPAM 1 MG PO TABS
1.0000 mg | ORAL_TABLET | Freq: Once | ORAL | Status: AC
Start: 2014-03-05 — End: 2014-03-05
  Administered 2014-03-05: 1 mg via ORAL
  Filled 2014-03-05: qty 1

## 2014-03-05 NOTE — Discharge Instructions (Signed)

## 2014-03-05 NOTE — ED Notes (Signed)
Removed Piv#20 from right forearm. Sight clean and no infiltrations.

## 2014-03-05 NOTE — ED Provider Notes (Signed)
Medical screening examination/treatment/procedure(s) were conducted as a shared visit with non-physician practitioner(s) and myself.  I personally evaluated the patient during the encounter.   EKG Interpretation None     23yM with penile discharge/hematuria. Treated for possible STD. UA consistent with UTI. Will tx for this as well. Significant sinus tachycardia. Pt says he does feel very anxious. Feels warm to touch. May potentially have fever and, if so, contributing to elevated HR as well. Rectal temp deferred. Will put in an IV. IVF. Dose NSAIDs. Nontoxic. No hypotension. Would like to see HR consistently a little lower prior to DC. Ultimately plan DC. Will need outpt re-check of BP.   Raeford Razor, MD 03/10/14 1259

## 2014-03-05 NOTE — ED Notes (Addendum)
Pt states he has been having pain, blood, and abnormal discharge for about a week. Pt stated the amount was minimal and urinehas been darkening more since then. Pt rates pain urinating at a 4/10. Pt denies taking any OTC medications for the complaints. Pt denies any injury/falls. Pt's HR on arrival was 138. Pt denies SOB and chest pain. Pt states he is very anxious and has an anxiety disorder. Pt is calm and mother is present at this time.

## 2014-03-05 NOTE — ED Provider Notes (Signed)
CSN: [id number]     Arrival date & time 03/05/14  1404 History  This chart was scribed for a non-physician practitioner, Carlyle Dolly, PA-C working with Mirian Mo, MD by [place] Peace, ED Scribe. The patient was seen in [facility]. The patient's care was started at 2:30 PM.    Chief Complaint  Patient presents with  . Hematuria  . Penile Discharge  . Dysuria      Patient is a 23 y.o. male presenting with hematuria, penile discharge, and dysuria. The history is provided by the patient. No language interpreter was used.  Hematuria This is a new problem. The current episode started more than 1 week ago. The problem has been gradually worsening. Pertinent negatives include no headaches and no shortness of breath. He has tried water for the symptoms.  Penile Discharge This is a new problem. The current episode started more than 1 week ago. Pertinent negatives include no headaches and no shortness of breath. He has tried water for the symptoms.  Dysuria This is a new problem. The current episode started more than 1 week ago. Pertinent negatives include no headaches and no shortness of breath. He has tried water for the symptoms.   HPI Comments: Harold Flores is a 23 y.o. male who presents to the Emergency Department complaining of hematuria accompanied by dysuria and "whitish" penile discharge onset 1 week ago. Pt describes his urine as having a very dark appearance and reports that it is progressively worsening. He adds that initially, if he was to drink a lot of water, then it would provide him with brief relief but currently, no matter what he drinks, he doesn't see any improvement in symptoms. Pt rates pain upon urinating as 4/10. He further denies taking any OTC medication or experiencing any injuries/falls that may be responsible for current complaints. Pt is former smoker (states he quit 5 days ago).   Past Medical History  Diagnosis Date  . Asthma   . Varicella   .  Epistaxis   . GERD (gastroesophageal reflux disease)    Past Surgical History  Procedure Laterality Date  . 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 Diabetes Mother   . Hyperlipidemia Mother   . Diabetes Maternal Grandmother   . Heart disease Paternal Grandfather   . Cancer Neg Hx    History  Substance Use Topics  . Smoking status: Former Games developer  . Smokeless tobacco: Never Used     Comment: QUIT 5 DAYS AGO  . Alcohol Use: Yes     Comment: Occassional     Review of Systems  Respiratory: Negative for shortness of breath.   Genitourinary: Positive for dysuria, hematuria, discharge and penile pain.  Neurological: Negative for headaches.  All other systems reviewed and are negative.     Allergies  Review of patient's allergies indicates no known allergies.  Home Medications   Prior to Admission medications   Medication Sig Start Date End Date Taking? Authorizing Provider  beclomethasone (QVAR) 80 MCG/ACT inhaler Inhale 1 puff into the lungs 2 (two) times daily. 10/08/12   Etta Grandchild, MD  lansoprazole (PREVACID) 30 MG capsule Take 30 mg by mouth daily.    Historical Provider, MD  lansoprazole (PREVACID) 30 MG capsule TAKE ONE CAPSULE BY MOUTH EVERY DAY 11/04/12   Etta Grandchild, MD  nebivolol (BYSTOLIC) 5 MG tablet Take 1 tablet (5 mg total) by mouth daily.  10/08/12   Etta Grandchild, MD  sertraline (ZOLOFT) 100 MG tablet TAKE 1 TABLET (100 MG TOTAL) BY MOUTH DAILY. 08/21/13   Jacques Navy, MD   BP 147/85  Pulse 138  Temp(Src) 99.4 F (37.4 C) (Oral)  Resp 20  Ht  (1.753 m)  Wt 160 lb (72.576 kg)  BMI 23.62 kg/m2  SpO2 100% Physical Exam  Nursing note and vitals reviewed. Constitutional: He is oriented to person, place, and time. He appears well-developed and well-nourished. No distress.  HENT:  Head: Normocephalic and atraumatic.  Eyes: Conjunctivae and EOM are normal.  Neck: Neck supple. No tracheal  deviation present.  Cardiovascular: Normal rate.   Pulmonary/Chest: Effort normal. No respiratory distress.  Genitourinary: Discharge found.  Positive penile discharge.   Musculoskeletal: Normal range of motion.  Neurological: He is alert and oriented to person, place, and time.  Skin: Skin is warm and dry.  Psychiatric: He has a normal mood and affect. His behavior is normal.    ED Course  Procedures (including critical care time) Labs Review Labs Reviewed - No data to display  Results for orders placed in visit on 10/08/12  TSH      Result Value Ref Range   TSH 1.36  0.35 - 5.50 uIU/mL  URINALYSIS, ROUTINE W REFLEX MICROSCOPIC      Result Value Ref Range   Color, Urine YELLOW  Yellow;Lt. Yellow   APPearance CLEAR  Clear   Specific Gravity, Urine 1.015  1.000-1.030   pH 7.5  5.0 - 8.0   Total Protein, Urine TRACE  Negative   Urine Glucose NEGATIVE  Negative   Ketones, ur NEGATIVE  Negative   Bilirubin Urine NEGATIVE  Negative   Hgb urine dipstick NEGATIVE  Negative   Urobilinogen, UA 1.0  0.0 - 1.0   Leukocytes, UA NEGATIVE  Negative   Nitrite NEGATIVE  Negative   WBC, UA 0-2/hpf  0-2/hpf   RBC / HPF 0-2/hpf  0-2/hpf   Mucus, UA Presence of  None   Renal Epithel, UA Rare(0-4/hpf)  None   Sperm, UA Presence of  None  LIPID PANEL      Result Value Ref Range   Cholesterol 116  0 - 200 mg/dL   Triglycerides 40.9  0.0 - 149.0 mg/dL   HDL 81.19 (*) >14.78 mg/dL   VLDL 7.4  0.0 - 29.5 mg/dL   LDL Cholesterol 76  0 - 99 mg/dL   Total CHOL/HDL Ratio 4    COMPREHENSIVE METABOLIC PANEL      Result Value Ref Range   Sodium 136  135 - 145 mEq/L   Potassium 3.9  3.5 - 5.1 mEq/L   Chloride 102  96 - 112 mEq/L   CO2 29  19 - 32 mEq/L   Glucose, Bld 96  70 - 99 mg/dL   BUN 8  6 - 23 mg/dL   Creatinine, Ser 0.8  0.4 - 1.5 mg/dL   Total Bilirubin 1.1  0.3 - 1.2 mg/dL   Alkaline Phosphatase 106  39 - 117 U/L   AST 16  0 - 37 U/L   ALT 13  0 - 53 U/L   Total Protein 7.5  6.0 -  8.3 g/dL   Albumin 4.2  3.5 - 5.2 g/dL   Calcium 9.3  8.4 - 62.1 mg/dL   GFR 308.65  >78.46 mL/min  CBC WITH DIFFERENTIAL      Result Value Ref Range   WBC 7.2  4.5 - 10.5  K/uL   RBC 5.37  4.22 - 5.81 Mil/uL   Hemoglobin 14.1  13.0 - 17.0 g/dL   HCT 04.5  40.9 - 81.1 %   MCV 77.8 (*) 78.0 - 100.0 fl   MCHC 33.6  30.0 - 36.0 g/dL   RDW 91.4  78.2 - 95.6 %   Platelets 295.0  150.0 - 400.0 K/uL   Neutrophils Relative % 46.5  43.0 - 77.0 %   Lymphocytes Relative 35.6  12.0 - 46.0 %   Monocytes Relative 14.7 (*) 3.0 - 12.0 %   Eosinophils Relative 2.7  0.0 - 5.0 %   Basophils Relative 0.5  0.0 - 3.0 %   Neutro Abs 3.4  1.4 - 7.7 K/uL   Lymphs Abs 2.6  0.7 - 4.0 K/uL   Monocytes Absolute 1.1 (*) 0.1 - 1.0 K/uL   Eosinophils Absolute 0.2  0.0 - 0.7 K/uL   Basophils Absolute 0.0  0.0 - 0.1 K/uL  GC/CHLAMYDIA PROBE AMP, URINE      Result Value Ref Range   Chlamydia, Swab/Urine, PCR NEGATIVE  NEGATIVE   GC Probe Amp, Urine NEGATIVE  NEGATIVE  RPR      Result Value Ref Range   RPR REACTIVE (*) NON REAC  HIV ANTIBODY (ROUTINE TESTING)      Result Value Ref Range   HIV NON REACTIVE  NON REACTIVE  RPR TITER      Result Value Ref Range   RPR Titer 1:16 (*)   T.PALLIDUM AB, TOTAL      Result Value Ref Range   T pallidum Antibodies (TP-PA) >8.00 (*) <0.90 S/CO     Patient appears very, anxious.  He'll be treated for STDs, based on the fact that he is active penile discharge.  Patient is thus a plan and all questions were answered   2:36 PM- Treatment plan was discussed with patient who verbalizes understanding and agrees.      I personally performed the services described in this documentation, which was scribed in my presence. The recorded information has been reviewed and is accurate.   Carlyle Dolly, PA-C 03/08/14 0128  Carlyle Dolly, PA-C 03/08/14 [phone]

## 2014-03-07 LAB — URINE CULTURE
Colony Count: NO GROWTH
Culture: NO GROWTH

## 2014-03-07 LAB — GC/CHLAMYDIA PROBE AMP
CT Probe RNA: NEGATIVE
GC Probe RNA: POSITIVE — AB

## 2014-03-08 ENCOUNTER — Telehealth (HOSPITAL_COMMUNITY): Payer: Self-pay

## 2014-03-10 NOTE — ED Provider Notes (Signed)
Medical screening examination/treatment/procedure(s) were performed by non-physician practitioner and as supervising physician I was immediately available for consultation/collaboration.   EKG Interpretation None       Shamus Desantis, MD 03/10/14 1343 

## 2014-05-22 ENCOUNTER — Encounter (HOSPITAL_COMMUNITY): Payer: Self-pay | Admitting: *Deleted

## 2014-05-22 ENCOUNTER — Emergency Department (HOSPITAL_COMMUNITY)
Admission: EM | Admit: 2014-05-22 | Discharge: 2014-05-22 | Disposition: A | Discharge status: Home / Self Care | Payer: 59 | Attending: Emergency Medicine | Admitting: Emergency Medicine

## 2014-05-22 DIAGNOSIS — Z87891 Personal history of nicotine dependence: Secondary | ICD-10-CM | POA: Insufficient documentation

## 2014-05-22 DIAGNOSIS — R000 Tachycardia, unspecified: Secondary | ICD-10-CM | POA: Insufficient documentation

## 2014-05-22 DIAGNOSIS — K625 Hemorrhage of anus and rectum: Secondary | ICD-10-CM | POA: Insufficient documentation

## 2014-05-22 DIAGNOSIS — Z79899 Other long term (current) drug therapy: Secondary | ICD-10-CM | POA: Insufficient documentation

## 2014-05-22 DIAGNOSIS — E876 Hypokalemia: Secondary | ICD-10-CM | POA: Insufficient documentation

## 2014-05-22 DIAGNOSIS — Z792 Long term (current) use of antibiotics: Secondary | ICD-10-CM | POA: Insufficient documentation

## 2014-05-22 DIAGNOSIS — A084 Viral intestinal infection, unspecified: Secondary | ICD-10-CM

## 2014-05-22 DIAGNOSIS — K219 Gastro-esophageal reflux disease without esophagitis: Secondary | ICD-10-CM | POA: Insufficient documentation

## 2014-05-22 DIAGNOSIS — J45909 Unspecified asthma, uncomplicated: Secondary | ICD-10-CM | POA: Insufficient documentation

## 2014-05-22 DIAGNOSIS — E860 Dehydration: Secondary | ICD-10-CM | POA: Insufficient documentation

## 2014-05-22 LAB — COMPREHENSIVE METABOLIC PANEL
ALT: 6 U/L (ref 0–53)
AST: 10 U/L (ref 0–37)
Albumin: 3.1 g/dL — ABNORMAL LOW (ref 3.5–5.2)
Alkaline Phosphatase: 68 U/L (ref 39–117)
Anion gap: 17 — ABNORMAL HIGH (ref 5–15)
BUN: 7 mg/dL (ref 6–23)
CO2: 24 mEq/L (ref 19–32)
Calcium: 8.6 mg/dL (ref 8.4–10.5)
Chloride: 93 mEq/L — ABNORMAL LOW (ref 96–112)
Creatinine, Ser: 0.89 mg/dL (ref 0.50–1.35)
GFR calc Af Amer: >90 mL/min (ref >90)
GFR calc non Af Amer: >90 mL/min (ref >90)
Glucose, Bld: 101 mg/dL — ABNORMAL HIGH (ref 70–99)
Potassium: 2.9 mEq/L — CL (ref 3.7–5.3)
Sodium: 134 mEq/L — ABNORMAL LOW (ref 137–147)
Total Bilirubin: 0.7 mg/dL (ref 0.3–1.2)
Total Protein: 6.9 g/dL (ref 6.0–8.3)

## 2014-05-22 LAB — CBC WITH DIFFERENTIAL/PLATELET
Basophils Absolute: 0 10*3/uL (ref 0.0–0.1)
Basophils Relative: 0 % (ref 0–1)
Eosinophils Absolute: 0 10*3/uL (ref 0.0–0.7)
Eosinophils Relative: 0 % (ref 0–5)
HCT: 38.7 % — ABNORMAL LOW (ref 39.0–52.0)
Hemoglobin: 13.4 g/dL (ref 13.0–17.0)
Lymphocytes Relative: 18 % (ref 12–46)
Lymphs Abs: 1.6 10*3/uL (ref 0.7–4.0)
MCH: 26.5 pg (ref 26.0–34.0)
MCHC: 34.6 g/dL (ref 30.0–36.0)
MCV: 76.6 fL — ABNORMAL LOW (ref 78.0–100.0)
Monocytes Absolute: 1.9 10*3/uL — ABNORMAL HIGH (ref 0.1–1.0)
Monocytes Relative: 21 % — ABNORMAL HIGH (ref 3–12)
Neutro Abs: 5.5 10*3/uL (ref 1.7–7.7)
Neutrophils Relative %: 61 % (ref 43–77)
Platelets: 175 10*3/uL (ref 150–400)
RBC: 5.05 MIL/uL (ref 4.22–5.81)
RDW: 13.8 % (ref 11.5–15.5)
WBC: 9 10*3/uL (ref 4.0–10.5)

## 2014-05-22 LAB — LIPASE, BLOOD: Lipase: 11 U/L (ref 11–59)

## 2014-05-22 MED ORDER — ONDANSETRON HCL 4 MG/2ML IJ SOLN
4.0000 mg | Freq: Once | INTRAMUSCULAR | Status: AC
  Administered 2014-05-22: 4 mg via INTRAVENOUS
  Filled 2014-05-22: qty 2

## 2014-05-22 MED ORDER — ACETAMINOPHEN 325 MG PO TABS
650.0000 mg | ORAL_TABLET | Freq: Once | ORAL | Status: AC
  Administered 2014-05-22: 650 mg via ORAL
  Filled 2014-05-22: qty 2

## 2014-05-22 MED ORDER — SODIUM CHLORIDE 0.9 % IV BOLUS (SEPSIS)
1000.0000 mL | Freq: Once | INTRAVENOUS | Status: AC
  Administered 2014-05-22: 1000 mL via INTRAVENOUS

## 2014-05-22 MED ORDER — KETOROLAC TROMETHAMINE 30 MG/ML IJ SOLN
30.0000 mg | Freq: Once | INTRAMUSCULAR | Status: AC
  Administered 2014-05-22: 30 mg via INTRAVENOUS
  Filled 2014-05-22: qty 1

## 2014-05-22 MED ORDER — POTASSIUM CHLORIDE CRYS ER 20 MEQ PO TBCR
40.0000 meq | EXTENDED_RELEASE_TABLET | Freq: Once | ORAL | Status: AC
  Administered 2014-05-22: 40 meq via ORAL
  Filled 2014-05-22: qty 2

## 2014-05-22 MED ORDER — ONDANSETRON HCL 8 MG PO TABS: 8.0000 mg | ORAL_TABLET | ORAL | Status: DC | PRN

## 2014-05-22 MED ORDER — LOPERAMIDE HCL 2 MG PO CAPS: 2.0000 mg | ORAL_CAPSULE | Freq: Four times a day (QID) | ORAL | Status: DC | PRN

## 2014-05-22 MED ORDER — IBUPROFEN 800 MG PO TABS: 800.0000 mg | ORAL_TABLET | Freq: Three times a day (TID) | ORAL | Status: DC

## 2014-05-22 NOTE — ED Provider Notes (Signed)
CSN: [id number]     Arrival date & time 05/22/14  1053 History   First MD Initiated Contact with Patient 05/22/14 1220     Chief Complaint  Patient presents with  . Emesis  . Diarrhea   HPI   Patient is a 23 year old male who presents emergency room for evaluation of diarrhea, nausea, and vomiting. Patient states that for the past 4 days he has had a fever with a maximum temperature of 103, diarrhea, nausea, and one episode of vomiting. He states that he is having many episodes of diarrhea per day. He describes them as loose and watery. He has noticed blood on his tissue only once. He feels that the skin around the rectum and is irritated, but denies any rectal pain. He has been unable to keep down much food other than Gatorade and half a banana. He has been taking Tylenol and ibuprofen with relief of his fevers. Denies any recent travel, suspicious food intake, or abdominal trauma. He does state that one of his coworkers have similar symptoms. He does work in a [employer]. He is not sure whether his customers have been ill. He has a history of GERD which he takes medication for.  Past Medical History  Diagnosis Date  . Asthma   . Varicella   . Epistaxis   . GERD (gastroesophageal reflux disease)    Past Surgical History  Procedure Laterality Date  . 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 Diabetes Mother   . Hyperlipidemia Mother   . Diabetes Maternal Grandmother   . Heart disease Paternal Grandfather   . Cancer Neg Hx    History  Substance Use Topics  . Smoking status: Former Games developermoker  . Smokeless tobacco: Current User     Comment: QUIT 5 DAYS AGO  . Alcohol Use: Yes     Comment: Occassional     Review of Systems  Constitutional: Positive for fever and fatigue. Negative for chills.  Respiratory: Negative for chest tightness and shortness of breath.   Cardiovascular: Negative for chest pain, palpitations and leg  swelling.  Gastrointestinal: Positive for nausea, vomiting, diarrhea and anal bleeding. Negative for abdominal pain, constipation and blood in stool.  Genitourinary: Negative for dysuria, urgency, frequency, hematuria and difficulty urinating.  Skin: Negative for rash.  All other systems reviewed and are negative.     Allergies  Review of patient's allergies indicates no known allergies.  Home Medications   Prior to Admission medications   Medication Sig Start Date End Date Taking? Authorizing Provider  ranitidine (ZANTAC) 150 MG tablet Take 300 mg by mouth daily.   Yes Historical Provider, MD  cephALEXin (KEFLEX) 500 MG capsule Take 1 capsule (500 mg total) by mouth 4 (four) times daily. Patient not taking: Reported on 05/22/2014 03/05/14   Raeford RazorStephen Kohut, MD  ibuprofen (ADVIL,MOTRIN) 800 MG tablet Take 1 tablet (800 mg total) by mouth 3 (three) times daily. 05/22/14   Deriyah Kunath A Forcucci, PA-C  loperamide (IMODIUM) 2 MG capsule Take 1 capsule (2 mg total) by mouth 4 (four) times daily as needed for diarrhea or loose stools. 05/22/14   [place] Silveria A Forcucci, PA-C  ondansetron (ZOFRAN) 8 MG tablet Take 1 tablet (8 mg total) by mouth every 4 (four) hours as needed for nausea. 05/22/14   Myranda Pavone A Forcucci, PA-C   BP 145/81 mmHg  Pulse 91  Temp(Src) 98.5 F (36.9 C) (Oral)  Resp 18  Ht 5\' 10"  (1.778 m)  Wt 170 lb (77.111 kg)  BMI 24.39 kg/m2  SpO2 100% Physical Exam  Constitutional: He is oriented to person, place, and time. He appears well-developed and well-nourished. No distress.  HENT:  Head: Normocephalic and atraumatic.  Mouth/Throat: Oropharynx is clear and moist. No oropharyngeal exudate.  Eyes: Conjunctivae and EOM are normal. Pupils are equal, round, and reactive to light. No scleral icterus.  Neck: Normal range of motion. Neck supple. No JVD present. No thyromegaly present.  Cardiovascular: Regular rhythm, normal heart sounds and intact distal pulses.  Exam reveals no  gallop and no friction rub.   No murmur heard. Tachycardic   Pulmonary/Chest: Effort normal and breath sounds normal. No respiratory distress. He has no wheezes. He has no rales. He exhibits no tenderness.  Abdominal: Soft. Bowel sounds are normal. He exhibits no distension and no mass. There is no tenderness. There is no rebound and no guarding.  Musculoskeletal: Normal range of motion.  Lymphadenopathy:    He has no cervical adenopathy.  Neurological: He is alert and oriented to person, place, and time.  Skin: Skin is warm and dry. He is not diaphoretic.  Psychiatric: He has a normal mood and affect. His behavior is normal. Judgment and thought content normal.  Nursing note and vitals reviewed.   ED Course  Procedures (including critical care time) Labs Review Labs Reviewed  CBC WITH DIFFERENTIAL - Abnormal; Notable for the following:    HCT 38.7 (*)    MCV 76.6 (*)    Monocytes Relative 21 (*)    Monocytes Absolute 1.9 (*)    All other components within normal limits  COMPREHENSIVE METABOLIC PANEL - Abnormal; Notable for the following:    Sodium 134 (*)    Potassium 2.9 (*)    Chloride 93 (*)    Glucose, Bld 101 (*)    Albumin 3.1 (*)    Anion gap 17 (*)    All other components within normal limits  LIPASE, BLOOD  URINALYSIS, ROUTINE W REFLEX MICROSCOPIC    Imaging Review No results found.   EKG Interpretation None      MDM   Final diagnoses:  Viral gastroenteritis  Hypokalemia  Dehydration   Patient is a 23 year old male who presents emergency room for evaluation of diarrhea, nausea, vomiting and fever. Physical exam reveals an alert and nontoxic-appearing male who is febrile with no tenderness to the abdomen. There are no peritoneal signs on exam. CBC is unremarkable. CMP reveals dehydration with a small anion gap. Suspect anion gap is likely due to dehydration. She also has hypokalemia. Patient given 40 mEq of potassium here. Patient has had good relief of  symptoms with 2 L normal saline bolus, Zofran, Tylenol and Toradol. Patient was tachycardic on arrival. Tachycardia has resolved with fluids. Suspect that this is likely a viral gastrointestinal illness. Will treat symptomatically with Zofran, and alternating Tylenol or Motrin as needed, and Imodium for diarrhea. Patient to return for intractable fevers, dehydration signs which we discussed, or intractable abdominal pain. Patient states understanding and agreement at this time. Patient to follow-up with his PCP office this week.    Eben Burowourtney A Forcucci, PA-C 05/22/14 1601  Linwood DibblesJon Knapp, MD 05/23/14 [phone]

## 2014-05-22 NOTE — ED Notes (Signed)
Pt report N/V/D since thursday wit some high fevers  (up to 103).  Mom gave pt phenergan 25 mg tab and that seemed to help.   Pt is tired and run down.

## 2014-05-22 NOTE — Discharge Instructions (Signed)
Viral Gastroenteritis Viral gastroenteritis is also known as stomach flu. This condition affects the stomach and intestinal tract. It can cause sudden diarrhea and vomiting. The illness typically lasts 3 to 8 days. Most people develop an immune response that eventually gets rid of the virus. While this natural response develops, the virus can make you quite ill. CAUSES  Many different viruses can cause gastroenteritis, such as rotavirus or noroviruses. You can catch one of these viruses by consuming contaminated food or water. You may also catch a virus by sharing utensils or other personal items with an infected person or by touching a contaminated surface. SYMPTOMS  The most common symptoms are diarrhea and vomiting. These problems can cause a severe loss of body fluids (dehydration) and a body salt (electrolyte) imbalance. Other symptoms may include:  Fever.  Headache.  Fatigue.  Abdominal pain. DIAGNOSIS  Your caregiver can usually diagnose viral gastroenteritis based on your symptoms and a physical exam. A stool sample may also be taken to test for the presence of viruses or other infections. TREATMENT  This illness typically goes away on its own. Treatments are aimed at rehydration. The most serious cases of viral gastroenteritis involve vomiting so severely that you are not able to keep fluids down. In these cases, fluids must be given through an intravenous line (IV). HOME CARE INSTRUCTIONS   Drink enough fluids to keep your urine clear or pale yellow. Drink small amounts of fluids frequently and increase the amounts as tolerated.  Ask your caregiver for specific rehydration instructions.  Avoid:  Foods high in sugar.  Alcohol.  Carbonated drinks.  Tobacco.  Juice.  Caffeine drinks.  Extremely hot or cold fluids.  Fatty, greasy foods.  Too much intake of anything at one time.  Dairy products until 24 to 48 hours after diarrhea stops.  You may consume probiotics.  Probiotics are active cultures of beneficial bacteria. They may lessen the amount and number of diarrheal stools in adults. Probiotics can be found in yogurt with active cultures and in supplements.  Wash your hands well to avoid spreading the virus.  Only take over-the-counter or prescription medicines for pain, discomfort, or fever as directed by your caregiver. Do not give aspirin to children. Antidiarrheal medicines are not recommended.  Ask your caregiver if you should continue to take your regular prescribed and over-the-counter medicines.  Keep all follow-up appointments as directed by your caregiver. SEEK IMMEDIATE MEDICAL CARE IF:   You are unable to keep fluids down.  You do not urinate at least once every 6 to 8 hours.  You develop shortness of breath.  You notice blood in your stool or vomit. This may look like coffee grounds.  You have abdominal pain that increases or is concentrated in one small area (localized).  You have persistent vomiting or diarrhea.  You have a fever.  The patient is a child younger than 3 months, and he or she has a fever.  The patient is a child older than 3 months, and he or she has a fever and persistent symptoms.  The patient is a child older than 3 months, and he or she has a fever and symptoms suddenly get worse.  The patient is a baby, and he or she has no tears when crying. MAKE SURE YOU:   Understand these instructions.  Will watch your condition.  Will get help right away if you are not doing well or get worse. Document Released: 05/27/2005 Document Revised: 08/19/2011 Document Reviewed: 03/13/2011   ExitCare Patient Information 2015 [employer]. This information is not intended to replace advice given to you by your health care provider. Make sure you discuss any questions you have with your health care provider.   Food Choices to Help Relieve Diarrhea When you have diarrhea, the foods you eat and your eating habits are  very important. Choosing the right foods and drinks can help relieve diarrhea. Also, because diarrhea can last up to 7 days, you need to replace lost fluids and electrolytes (such as sodium, potassium, and chloride) in order to help prevent dehydration.  WHAT GENERAL GUIDELINES DO I NEED TO FOLLOW?  Slowly drink 1 cup (8 oz) of fluid for each episode of diarrhea. If you are getting enough fluid, your urine will be clear or pale yellow.  Eat starchy foods. Some good choices include white rice, white toast, pasta, low-fiber cereal, baked potatoes (without the skin), saltine crackers, and bagels.  Avoid large servings of any cooked vegetables.  Limit fruit to two servings per day. A serving is  cup or 1 small piece.  Choose foods with less than 2 g of fiber per serving.  Limit fats to less than 8 tsp (38 g) per day.  Avoid fried foods.  Eat foods that have probiotics in them. Probiotics can be found in certain dairy products.  Avoid foods and beverages that may increase the speed at which food moves through the stomach and intestines (gastrointestinal tract). Things to avoid include:  High-fiber foods, such as dried fruit, raw fruits and vegetables, nuts, seeds, and whole grain foods.  Spicy foods and high-fat foods.  Foods and beverages sweetened with high-fructose corn syrup, honey, or sugar alcohols such as xylitol, sorbitol, and mannitol. WHAT FOODS ARE RECOMMENDED? Grains White rice. White, French, or pita breads (fresh or toasted), including plain rolls, buns, or bagels. White pasta. Saltine, soda, or graham crackers. Pretzels. Low-fiber cereal. Cooked cereals made with water (such as cornmeal, farina, or cream cereals). Plain muffins. Matzo. Melba toast. Zwieback.  Vegetables Potatoes (without the skin). Strained tomato and vegetable juices. Most well-cooked and canned vegetables without seeds. Tender lettuce. Fruits Cooked or canned applesauce, apricots, cherries, fruit  cocktail, grapefruit, peaches, pears, or plums. Fresh bananas, apples without skin, cherries, grapes, cantaloupe, grapefruit, peaches, oranges, or plums.  Meat and Other Protein Products Baked or boiled chicken. Eggs. Tofu. Fish. Seafood. Smooth peanut butter. Ground or well-cooked tender beef, ham, veal, lamb, pork, or poultry.  Dairy Plain yogurt, kefir, and unsweetened liquid yogurt. Lactose-free milk, buttermilk, or soy milk. Plain hard cheese. Beverages Sport drinks. Clear broths. Diluted fruit juices (except prune). Regular, caffeine-free sodas such as ginger ale. Water. Decaffeinated teas. Oral rehydration solutions. Sugar-free beverages not sweetened with sugar alcohols. Other Bouillon, broth, or soups made from recommended foods.  The items listed above may not be a complete list of recommended foods or beverages. Contact your dietitian for more options. WHAT FOODS ARE NOT RECOMMENDED? Grains Whole grain, whole wheat, bran, or rye breads, rolls, pastas, crackers, and cereals. Wild or brown rice. Cereals that contain more than 2 g of fiber per serving. Corn tortillas or taco shells. Cooked or dry oatmeal. Granola. Popcorn. Vegetables Raw vegetables. Cabbage, broccoli, Brussels sprouts, artichokes, baked beans, beet greens, corn, kale, legumes, peas, sweet potatoes, and yams. Potato skins. Cooked spinach and cabbage. Fruits Dried fruit, including raisins and dates. Raw fruits. Stewed or dried prunes. Fresh apples with skin, apricots, mangoes, pears, raspberries, and strawberries.  Meat and Other Protein Products Chunky   peanut butter. Nuts and seeds. Beans and lentils. Bacon.  Dairy High-fat cheeses. Milk, chocolate milk, and beverages made with milk, such as milk shakes. Cream. Ice cream. Sweets and Desserts Sweet rolls, doughnuts, and sweet breads. Pancakes and waffles. Fats and Oils Butter. Cream sauces. Margarine. Salad oils. Plain salad dressings. Olives. Avocados.   Beverages Caffeinated beverages (such as coffee, tea, soda, or energy drinks). Alcoholic beverages. Fruit juices with pulp. Prune juice. Soft drinks sweetened with high-fructose corn syrup or sugar alcohols. Other Coconut. Hot sauce. Chili powder. Mayonnaise. Gravy. Cream-based or milk-based soups.  The items listed above may not be a complete list of foods and beverages to avoid. Contact your dietitian for more information. WHAT SHOULD I DO IF I BECOME DEHYDRATED? Diarrhea can sometimes lead to dehydration. Signs of dehydration include dark urine and dry mouth and skin. If you think you are dehydrated, you should rehydrate with an oral rehydration solution. These solutions can be purchased at pharmacies, retail stores, or online.  Drink -1 cup (120-240 mL) of oral rehydration solution each time you have an episode of diarrhea. If drinking this amount makes your diarrhea worse, try drinking smaller amounts more often. For example, drink 1-3 tsp (5-15 mL) every 5-10 minutes.  A general rule for staying hydrated is to drink 1-2 L of fluid per day. Talk to your health care provider about the specific amount you should be drinking each day. Drink enough fluids to keep your urine clear or pale yellow. Document Released: 08/17/2003 Document Revised: 06/01/2013 Document Reviewed: 04/19/2013 Exit[employer] Patient Information 2015 [employer]. This information is not intended to replace advice given to you by your health care provider. Make sure you discuss any questions you have with your health care provider.  

## 2014-05-22 NOTE — ED Notes (Signed)
Patient states he has had N/V/D, lethargy and fever (as high as 103 F) since Thursday past.  Patient also c/o lower abdominal cramping.  Patient denies SOB and chest pain.  Patient has been taking Gatorade and Tylenol to treat sx with some relief.  Patient's symptoms have primarily been diarrhea, he has vomited once and that was last night after drinking room temperature water.  Patient has been able to tolerate Gatorade.

## 2014-05-22 NOTE — ED Notes (Signed)
Pt resting conformably with family at bedside.  Courtney PA states we dont need the urine sample.

## 2014-08-25 ENCOUNTER — Encounter: Payer: Self-pay | Admitting: Family

## 2014-08-25 ENCOUNTER — Ambulatory Visit (INDEPENDENT_AMBULATORY_CARE_PROVIDER_SITE_OTHER): Payer: No Typology Code available for payment source | Admitting: Family

## 2014-08-25 VITALS — BP 180/100 | HR 157 | Temp 99.1°F | Resp 18 | Ht 70.0 in | Wt 166.0 lb

## 2014-08-25 DIAGNOSIS — S31829D Unspecified open wound of left buttock, subsequent encounter: Secondary | ICD-10-CM

## 2014-08-25 DIAGNOSIS — S31809A Unspecified open wound of unspecified buttock, initial encounter: Secondary | ICD-10-CM | POA: Insufficient documentation

## 2014-08-25 DIAGNOSIS — Z23 Encounter for immunization: Secondary | ICD-10-CM

## 2014-08-25 MED ORDER — SULFAMETHOXAZOLE-TRIMETHOPRIM 800-160 MG PO TABS: 1.0000 | ORAL_TABLET | Freq: Two times a day (BID) | ORAL | Status: DC

## 2014-08-25 NOTE — Patient Instructions (Signed)
Thank you for choosing [facility].  Summary/Instructions:  Your prescription(s) have been submitted to your pharmacy or been printed and provided for you. Please take as directed and contact our office if you believe you are having problem(s) with the medication(s) or have any questions.  If your symptoms worsen or fail to improve, please contact our office for further instruction, or in case of emergency go directly to the emergency room at the closest medical facility.     Recommend Silver sulfadiazine application and keeping it covered to protect it from the stool.

## 2014-08-25 NOTE — Assessment & Plan Note (Signed)
Open wound appears as ulceration question possible rupture of previous boil. Discussed wound care with patient and dressing to protect from stool. Given location, start Bactrim to cover for any infections. Follow-up in 2 weeks or sooner if needed.

## 2014-08-25 NOTE — Progress Notes (Signed)
   Subjective:    Patient ID: Harold Flores, male    DOB: 02/26/1991, 24 y.o.   MRN: [id number]  Chief Complaint  Patient presents with  . Establish Care    had a hemerroid in january and said that it drained and he has still had pain going to the bathroom    HPI:  Harold Flores is a 24 y.o. male who presents today to establish care with this provider and discuss a hemorrhoid.  This is worsening problem. Associated symptom of a hemorrhoid located on his rectum has been going on for . Notes that he had diarrhea and noted hemorrhoid development and states that it "popped". Contextually worse with bowel movements. Notes there is some bright red blood occasionally. Describes symptoms of burning and itching following a bowel movement. Modifying factors include hemorrhoid wipes.  No Known Allergies  Current Outpatient Prescriptions on File Prior to Visit  Medication Sig Dispense Refill  . ranitidine (ZANTAC) 150 MG tablet Take 300 mg by mouth daily.     No current facility-administered medications on file prior to visit.    Review of Systems  Constitutional: Negative for fever and chills.  Gastrointestinal: Positive for anal bleeding. Negative for nausea, vomiting, abdominal pain and constipation.      Objective:    BP 180/100 mmHg  Pulse 157  Temp(Src) 99.1 F (37.3 C) (Oral)  Resp 18  Ht 5\' 10"  (1.778 m)  Wt 166 lb (75.297 kg)  BMI 23.82 kg/m2  SpO2 99% Nursing note and vital signs reviewed.  Physical Exam  Constitutional: He is oriented to person, place, and time. He appears well-developed and well-nourished. No distress.  Cardiovascular: Normal rate, regular rhythm, normal heart sounds and intact distal pulses.   Pulmonary/Chest: Effort normal and breath sounds normal.  Genitourinary: Rectal exam shows no external hemorrhoid and no fissure.     Neurological: He is alert and oriented to person, place, and time.  Skin: Skin is warm and dry.  Psychiatric: He  has a normal mood and affect. His behavior is normal. Judgment and thought content normal.       Assessment & Plan:

## 2014-08-25 NOTE — Progress Notes (Signed)
Pre visit review using our clinic review tool, if applicable. No additional management support is needed unless otherwise documented below in the visit note. 

## 2014-08-26 ENCOUNTER — Telehealth: Payer: Self-pay | Admitting: Internal Medicine

## 2014-08-26 NOTE — Telephone Encounter (Signed)
emmi mailed  °

## 2014-09-08 ENCOUNTER — Ambulatory Visit: Payer: Self-pay | Admitting: Family

## 2014-09-09 ENCOUNTER — Encounter: Payer: Self-pay | Admitting: Family

## 2014-09-09 ENCOUNTER — Ambulatory Visit (INDEPENDENT_AMBULATORY_CARE_PROVIDER_SITE_OTHER): Payer: No Typology Code available for payment source | Admitting: Family

## 2014-09-09 VITALS — BP 170/100 | HR 120 | Temp 98.0°F | Ht 70.0 in | Wt 170.8 lb

## 2014-09-09 DIAGNOSIS — S31829D Unspecified open wound of left buttock, subsequent encounter: Secondary | ICD-10-CM

## 2014-09-09 NOTE — Patient Instructions (Signed)
Thank you for choosing [facility].  Summary/Instructions:  Your prescription(s) have been submitted to your pharmacy or been printed and provided for you. Please take as directed and contact our office if you believe you are having problem(s) with the medication(s) or have any questions.  If your symptoms worsen or fail to improve, please contact our office for further instruction, or in case of emergency go directly to the emergency room at the closest medical facility.   Try the silver sufazidine. Try non-stick gauze.   If you see no improvement we can consult wound therapy.

## 2014-09-09 NOTE — Assessment & Plan Note (Signed)
Wound appears to be healing slowly. Start silver sulfazadine cream and non-stick gauze. If no improvement is noted in about 2 weeks will consult wound management for further evaluation.

## 2014-09-09 NOTE — Progress Notes (Signed)
Pre visit review using our clinic review tool, if applicable. No additional management support is needed unless otherwise documented below in the visit note. 

## 2014-09-09 NOTE — Progress Notes (Signed)
   Subjective:    Patient ID: Harold Flores, male    DOB: 07/05/1990, 24 y.o.   MRN: [id number]  Chief Complaint  Patient presents with  . Follow-up    hemorrhoid that ruptured    HPI:  Harold Pocheaylor A Velis is a 24 y.o. male who presents today for follow up.   Previously noted to have an open wound on his left gluteal. He reports some improvement since last visit and completion of the antibiotic. Indicates he tries to keep it covered and has been using vasoline and neosporin.  Continues to experience the associated symptom of some pain with bowel movements.    No Known Allergies  Current Outpatient Prescriptions on File Prior to Visit  Medication Sig Dispense Refill  . ranitidine (ZANTAC) 150 MG tablet Take 300 mg by mouth daily.     No current facility-administered medications on file prior to visit.    Review of Systems  Constitutional: Negative for fever and chills.  Skin: Positive for wound.      Objective:    BP 170/100 mmHg  Pulse 120  Temp(Src) 98 F (36.7 C) (Oral)  Ht 5\' 10"  (1.778 m)  Wt 170 lb 12 oz (77.452 kg)  BMI 24.50 kg/m2  SpO2 99% Nursing note and vital signs reviewed.  Physical Exam  Constitutional: He is oriented to person, place, and time. He appears well-developed and well-nourished. No distress.  Cardiovascular: Normal rate, regular rhythm, normal heart sounds and intact distal pulses.   Pulmonary/Chest: Effort normal and breath sounds normal.  Genitourinary:  Wound appears slightly smaller with mild clear discharge. No evidence of infection noted.   Neurological: He is alert and oriented to person, place, and time.  Skin: Skin is warm and dry.  Psychiatric: He has a normal mood and affect. His behavior is normal. Judgment and thought content normal.       Assessment & Plan:

## 2014-12-05 ENCOUNTER — Telehealth: Payer: Self-pay | Admitting: Family

## 2014-12-05 NOTE — Telephone Encounter (Signed)
   I reviewed Harold Flores's notes; the gentleman has a slowly healing wound of the gluteus maximus. Appropriate would be referral to the wound center; this is not my area of expertise.  I'll be happy to place that referral. I will also order some tramadol if he wanted.

## 2014-12-05 NOTE — Telephone Encounter (Signed)
Patient states he is no better.  Was dissatisfied that he was only given something over the counter.  Wants to see another provider.  Will you see him?

## 2014-12-06 ENCOUNTER — Other Ambulatory Visit: Payer: Self-pay | Admitting: Emergency Medicine

## 2014-12-06 MED ORDER — TRAMADOL HCL 50 MG PO TABS: 50.0000 mg | ORAL_TABLET | Freq: Three times a day (TID) | ORAL | Status: DC | PRN

## 2014-12-06 NOTE — Telephone Encounter (Signed)
RX for Tramadol was sent into pharm.

## 2014-12-06 NOTE — Telephone Encounter (Signed)
i called the patient. He understood. He does wish to have a referral to wound care and also receive the RX for tramadol. I verified that pharmacy on file is correct. Mobile # is the best # as well.

## 2014-12-07 ENCOUNTER — Other Ambulatory Visit: Payer: Self-pay | Admitting: Emergency Medicine

## 2014-12-07 ENCOUNTER — Other Ambulatory Visit: Payer: Self-pay | Admitting: Internal Medicine

## 2014-12-07 DIAGNOSIS — S31829D Unspecified open wound of left buttock, subsequent encounter: Secondary | ICD-10-CM

## 2014-12-07 MED ORDER — TRAMADOL HCL 50 MG PO TABS: 50.0000 mg | ORAL_TABLET | Freq: Three times a day (TID) | ORAL | Status: DC | PRN

## 2014-12-07 NOTE — Telephone Encounter (Signed)
Spoke with pt, informed him that Tramadol was sent to pharm and that a referral for wound care will be put in place.

## 2014-12-15 ENCOUNTER — Encounter: Payer: Self-pay | Admitting: Internal Medicine

## 2014-12-15 ENCOUNTER — Other Ambulatory Visit: Payer: Self-pay | Admitting: Family

## 2014-12-15 DIAGNOSIS — K603 Anal fistula: Secondary | ICD-10-CM

## 2014-12-21 ENCOUNTER — Ambulatory Visit (INDEPENDENT_AMBULATORY_CARE_PROVIDER_SITE_OTHER): Payer: No Typology Code available for payment source | Admitting: Internal Medicine

## 2014-12-21 ENCOUNTER — Other Ambulatory Visit (INDEPENDENT_AMBULATORY_CARE_PROVIDER_SITE_OTHER): Payer: No Typology Code available for payment source

## 2014-12-21 ENCOUNTER — Encounter: Payer: Self-pay | Admitting: Internal Medicine

## 2014-12-21 VITALS — BP 160/88 | HR 100 | Temp 98.7°F | Resp 16 | Ht 70.0 in | Wt 166.0 lb

## 2014-12-21 DIAGNOSIS — R000 Tachycardia, unspecified: Secondary | ICD-10-CM

## 2014-12-21 DIAGNOSIS — I10 Essential (primary) hypertension: Secondary | ICD-10-CM | POA: Diagnosis not present

## 2014-12-21 DIAGNOSIS — K602 Anal fissure, unspecified: Secondary | ICD-10-CM | POA: Diagnosis not present

## 2014-12-21 DIAGNOSIS — K626 Ulcer of anus and rectum: Secondary | ICD-10-CM

## 2014-12-21 DIAGNOSIS — F411 Generalized anxiety disorder: Secondary | ICD-10-CM

## 2014-12-21 DIAGNOSIS — A5139 Other secondary syphilis of skin: Secondary | ICD-10-CM

## 2014-12-21 LAB — COMPREHENSIVE METABOLIC PANEL
ALT: 8 U/L (ref 0–53)
AST: 13 U/L (ref 0–37)
Albumin: 4.4 g/dL (ref 3.5–5.2)
Alkaline Phosphatase: 94 U/L (ref 39–117)
BUN: 7 mg/dL (ref 6–23)
CO2: 29 mEq/L (ref 19–32)
Calcium: 9.6 mg/dL (ref 8.4–10.5)
Chloride: 104 mEq/L (ref 96–112)
Creatinine, Ser: 0.89 mg/dL (ref 0.40–1.50)
GFR: 135.72 mL/min (ref >60.00)
Glucose, Bld: 114 mg/dL — ABNORMAL HIGH (ref 70–99)
Potassium: 3.4 mEq/L — ABNORMAL LOW (ref 3.5–5.1)
Sodium: 141 mEq/L (ref 135–145)
Total Bilirubin: 1.1 mg/dL (ref 0.2–1.2)
Total Protein: 7.9 g/dL (ref 6.0–8.3)

## 2014-12-21 LAB — CBC WITH DIFFERENTIAL/PLATELET
Basophils Absolute: 0 10*3/uL (ref 0.0–0.1)
Basophils Relative: 0.5 % (ref 0.0–3.0)
Eosinophils Absolute: 0.2 10*3/uL (ref 0.0–0.7)
Eosinophils Relative: 2.8 % (ref 0.0–5.0)
HCT: 42.4 % (ref 39.0–52.0)
Hemoglobin: 13.8 g/dL (ref 13.0–17.0)
Lymphocytes Relative: 40.8 % (ref 12.0–46.0)
Lymphs Abs: 2.8 10*3/uL (ref 0.7–4.0)
MCHC: 32.6 g/dL (ref 30.0–36.0)
MCV: 78.5 fl (ref 78.0–100.0)
Monocytes Absolute: 1 10*3/uL (ref 0.1–1.0)
Monocytes Relative: 14.2 % — ABNORMAL HIGH (ref 3.0–12.0)
Neutro Abs: 2.9 10*3/uL (ref 1.4–7.7)
Neutrophils Relative %: 41.7 % — ABNORMAL LOW (ref 43.0–77.0)
Platelets: 226 10*3/uL (ref 150.0–400.0)
RBC: 5.41 Mil/uL (ref 4.22–5.81)
RDW: 15.5 % (ref 11.5–15.5)
WBC: 6.9 10*3/uL (ref 4.0–10.5)

## 2014-12-21 LAB — URINALYSIS, ROUTINE W REFLEX MICROSCOPIC
Bilirubin Urine: NEGATIVE
Hgb urine dipstick: NEGATIVE
Leukocytes, UA: NEGATIVE
Nitrite: NEGATIVE
Specific Gravity, Urine: 1.025 (ref 1.000–1.030)
Total Protein, Urine: NEGATIVE
Urine Glucose: NEGATIVE
Urobilinogen, UA: 0.2 (ref 0.0–1.0)
pH: 6 (ref 5.0–8.0)

## 2014-12-21 LAB — BASIC METABOLIC PANEL
BUN: 7 mg/dL (ref 6–23)
CO2: 29 mEq/L (ref 19–32)
Calcium: 9.6 mg/dL (ref 8.4–10.5)
Chloride: 104 mEq/L (ref 96–112)
Creatinine, Ser: 0.89 mg/dL (ref 0.40–1.50)
GFR: 135.72 mL/min (ref >60.00)
Glucose, Bld: 114 mg/dL — ABNORMAL HIGH (ref 70–99)
Potassium: 3.4 mEq/L — ABNORMAL LOW (ref 3.5–5.1)
Sodium: 141 mEq/L (ref 135–145)

## 2014-12-21 LAB — RPR: RPR Ser Ql: REACTIVE — AB

## 2014-12-21 LAB — RPR TITER: RPR Titer: 1:1 {titer}

## 2014-12-21 MED ORDER — SERTRALINE HCL 50 MG PO TABS: 50.0000 mg | ORAL_TABLET | Freq: Every day | ORAL | Status: DC

## 2014-12-21 MED ORDER — CARVEDILOL 6.25 MG PO TABS: 6.2500 mg | ORAL_TABLET | Freq: Two times a day (BID) | ORAL | Status: DC

## 2014-12-21 NOTE — Progress Notes (Signed)
Subjective:  Patient ID: Harold Flores, male    DOB: 1991-03-08  Age: 24 y.o. MRN: [id number]  CC: Hypertension   HPI Harold Flores presents for concerns about rectal discomfort. For several months now he has been having painful bowel movements and feels like he has an intermittent discharge from his anus. The discharge is mostly clear but it is occasionally bloody. He has pain with bowel movements. He also has sores and lesions around his perianal region. He has an area on his right buttocks over the initial tuberosity that is being treated as a chronic ulcer.  He also complains of anxiety and panic and wants to restart taking sertraline. He has a history of high blood pressure but is not currently treating it.  Outpatient Prescriptions Prior to Visit  Medication Sig Dispense Refill  . Multiple Vitamins-Minerals (MENS MULTIVITAMIN PLUS) TABS Take by mouth.    . ranitidine (ZANTAC) 150 MG tablet Take 300 mg by mouth daily.    . traMADol (ULTRAM) 50 MG tablet Take 1 tablet (50 mg total) by mouth every 8 (eight) hours as needed. 30 tablet 0   No facility-administered medications prior to visit.    ROS Review of Systems  Constitutional: Negative.  Negative for fever, chills, diaphoresis, activity change, appetite change, fatigue and unexpected weight change.  HENT: Negative.   Eyes: Negative.   Respiratory: Negative.  Negative for cough, choking, chest tightness, shortness of breath and stridor.   Cardiovascular: Negative.  Negative for chest pain, palpitations and leg swelling.  Gastrointestinal: Positive for blood in stool, anal bleeding and rectal pain. Negative for nausea, abdominal pain, diarrhea and constipation.  Endocrine: Negative.   Genitourinary: Negative.  Negative for dysuria, urgency, frequency, hematuria, flank pain, decreased urine volume, discharge, penile swelling, scrotal swelling, enuresis, difficulty urinating, genital sores, penile pain and testicular pain.    Musculoskeletal: Negative.   Skin: Positive for rash.  Allergic/Immunologic: Negative.   Neurological: Negative.  Negative for dizziness.  Hematological: Negative.  Negative for adenopathy. Does not bruise/bleed easily.  Psychiatric/Behavioral: Positive for sleep disturbance. The patient is nervous/anxious.     Objective:  BP 160/88 mmHg  Pulse 100  Temp(Src) 98.7 F (37.1 C) (Oral)  Resp 16  Ht  (1.778 m)  Wt 166 lb (75.297 kg)  BMI 23.82 kg/m2  SpO2 98%  BP Readings from Last 3 Encounters:  12/21/14 160/88  09/09/14 170/100  08/25/14 180/100    Wt Readings from Last 3 Encounters:  12/21/14 166 lb (75.297 kg)  09/09/14 170 lb 12 oz (77.452 kg)  08/25/14 166 lb (75.297 kg)    Physical Exam  Constitutional: He is oriented to person, place, and time. He appears well-developed and well-nourished. No distress.  HENT:  Mouth/Throat: Oropharynx is clear and moist. No oropharyngeal exudate.  Eyes: Conjunctivae are normal. Right eye exhibits no discharge. Left eye exhibits no discharge. No scleral icterus.  Neck: Normal range of motion. Neck supple. No JVD present. No tracheal deviation present. No thyromegaly present.  Cardiovascular: Regular rhythm, S1 normal, S2 normal and normal heart sounds.  Tachycardia present.  Exam reveals no gallop.   No murmur heard. His heart is pounding  Pulmonary/Chest: Effort normal and breath sounds normal. No respiratory distress. He has no wheezes. He has no rales. He exhibits no tenderness.  Abdominal: Soft. Bowel sounds are normal. He exhibits no distension. There is no tenderness. There is no guarding. Hernia confirmed negative in the right inguinal area and confirmed negative in the  left inguinal area.  Genitourinary: Prostate normal, testes normal and penis normal. Rectal exam shows fissure and tenderness. Rectal exam shows no external hemorrhoid, no internal hemorrhoid, no mass and anal tone normal. Guaiac negative stool. Prostate is  not enlarged and not tender. Right testis shows no mass, no swelling and no tenderness. Right testis is descended. Left testis shows no mass, no swelling and no tenderness. Left testis is descended. Circumcised. No penile erythema or penile tenderness. No discharge found.     The perianal region is diffusely tender to palpation. Over the left lateral margin there is a linear indurated tract. I do not detect any masses, internal and external anal hemorrhoids. I suspect that there is a fish fissure.  Musculoskeletal: Normal range of motion. He exhibits no edema or tenderness.  Lymphadenopathy:    He has no cervical adenopathy.       Right: No inguinal adenopathy present.       Left: No inguinal adenopathy present.  Neurological: He is oriented to person, place, and time.  Skin: Skin is warm and dry. No rash noted. He is not diaphoretic. No erythema. No pallor.  Psychiatric: His behavior is normal. Judgment and thought content normal. His mood appears anxious. His affect is not angry, not blunt, not labile and not inappropriate. His speech is not rapid and/or pressured. He is not agitated. Thought content is not paranoid. Cognition and memory are normal. He does not exhibit a depressed mood. He expresses no homicidal and no suicidal ideation. He expresses no suicidal plans and no homicidal plans.  Vitals reviewed.   Lab Results  Component Value Date   WBC 6.9 12/21/2014   HGB 13.8 12/21/2014   HCT 42.4 12/21/2014   PLT 226.0 12/21/2014   GLUCOSE 114* 12/21/2014   GLUCOSE 114* 12/21/2014   CHOL 116 10/08/2012   TRIG 37.0 10/08/2012   HDL 33.10* 10/08/2012   LDLCALC 76 10/08/2012   ALT 8 12/21/2014   AST 13 12/21/2014   NA 141 12/21/2014   NA 141 12/21/2014   K 3.4* 12/21/2014   K 3.4* 12/21/2014   CL 104 12/21/2014   CL 104 12/21/2014   CREATININE 0.89 12/21/2014   CREATININE 0.89 12/21/2014   BUN 7 12/21/2014   BUN 7 12/21/2014   CO2 29 12/21/2014   CO2 29 12/21/2014   TSH 1.36  10/08/2012   HGBA1C 5.6 06/12/2009    No results found.  Assessment & Plan:   Ladona Ridgelaylor was seen today for hypertension.  Diagnoses and all orders for this visit:  Essential hypertension, benign- his blood pressure is not well controlled. Will start carvedilol. Will check his labs to screen for secondary causes and end organ damage. Orders: -     Basic metabolic panel; Future -     CBC with Differential/Platelet; Future -     Comprehensive metabolic panel; Future -     Cancel: CBC with Differential/Platelet; Future -     Urinalysis, Routine w reflex microscopic (not at [facility]); Future -     carvedilol (COREG) 6.25 MG tablet; Take 1 tablet (6.25 mg total) by mouth 2 (two) times daily with a meal.  Tachycardia- EKG shows sinus tachycardia with no acute changes. This may be related to anxiety. Will check a urine drug screen to see if there is substance abuse. Will also check his labs for other secondary causes as well. Orders: -     Drugs of abuse screen w/o alc, rtn urine-sln; Future -  EKG 12-Lead  Anal fissure Orders: -     Ambulatory referral to General Surgery  Anal ulcer- we'll screen for herpes, HIV and syphilis. Orders: -     HSV(herpes smplx)abs-1+2(IgG+IgM)-bld; Future -     HIV antibody; Future  Syphilis, secondary, skin- will recheck his RPR titer and if it is not going down will consider retreatment. Orders: -     Cancel: CBC with Differential/Platelet; Future -     HIV antibody; Future -     RPR; Future  GAD (generalized anxiety disorder) Orders: -     Drugs of abuse screen w/o alc, rtn urine-sln; Future -     sertraline (ZOLOFT) 50 MG tablet; Take 1 tablet (50 mg total) by mouth daily.  I have discontinued Mr. Kerner traMADol. I am also having him start on carvedilol and sertraline. Additionally, I am having him maintain his ranitidine and MENS MULTIVITAMIN PLUS.  Meds ordered this encounter  Medications  . carvedilol (COREG) 6.25 MG tablet    Sig:  Take 1 tablet (6.25 mg total) by mouth 2 (two) times daily with a meal.    Dispense:  60 tablet    Refill:  3  . sertraline (ZOLOFT) 50 MG tablet    Sig: Take 1 tablet (50 mg total) by mouth daily.    Dispense:  30 tablet    Refill:  5     Follow-up: Return in about 3 weeks (around 01/11/2015).  Sanda Linger, MD

## 2014-12-21 NOTE — Progress Notes (Signed)
Pre visit review using our clinic review tool, if applicable. No additional management support is needed unless otherwise documented below in the visit note. 

## 2014-12-21 NOTE — Patient Instructions (Signed)

## 2014-12-22 LAB — DRUGS OF ABUSE SCREEN W/O ALC, ROUTINE URINE
Amphetamine Screen, Ur: NEGATIVE
Barbiturate Quant, Ur: NEGATIVE
Benzodiazepines.: POSITIVE — AB
Cocaine Metabolites: NEGATIVE
Creatinine,U: 412.4 mg/dL
Marijuana Metabolite: POSITIVE — AB
Methadone: NEGATIVE
Opiate Screen, Urine: NEGATIVE
Phencyclidine (PCP): NEGATIVE
Propoxyphene: NEGATIVE

## 2014-12-22 LAB — FLUORESCENT TREPONEMAL AB(FTA)-IGG-BLD: Fluorescent Treponemal ABS: REACTIVE — AB

## 2014-12-22 LAB — HIV ANTIBODY (ROUTINE TESTING W REFLEX)

## 2014-12-23 ENCOUNTER — Encounter: Payer: Self-pay | Admitting: Internal Medicine

## 2014-12-23 LAB — HSV(HERPES SMPLX)ABS-I+II(IGG+IGM)-BLD
HSV 1 Glycoprotein G Ab, IgG: 0.13 IV
HSV 2 Glycoprotein G Ab, IgG: 1.8 IV — ABNORMAL HIGH
Herpes Simplex Vrs I&II-IgM Ab (EIA): 0.38 INDEX

## 2014-12-26 LAB — BENZODIAZEPINES (GC/LC/MS), URINE
Alprazolam metabolite (GC/LC/MS), ur confirm: NEGATIVE ng/mL (ref <25)
Clonazepam metabolite (GC/LC/MS), ur confirm: NEGATIVE ng/mL (ref <25)
Flurazepam metabolite (GC/LC/MS), ur confirm: NEGATIVE ng/mL (ref <50)
Lorazepam (GC/LC/MS), ur confirm: NEGATIVE ng/mL (ref <50)
Midazolam (GC/LC/MS), ur confirm: NEGATIVE ng/mL (ref <50)
Nordiazepam (GC/LC/MS), ur confirm: 323 ng/mL — ABNORMAL HIGH (ref <50)
Oxazepam (GC/LC/MS), ur confirm: 983 ng/mL — ABNORMAL HIGH (ref <50)
Temazepam (GC/LC/MS), ur confirm: 620 ng/mL — ABNORMAL HIGH (ref <50)
Triazolam metabolite (GC/LC/MS), ur confirm: NEGATIVE ng/mL (ref <50)

## 2014-12-26 LAB — CANNABANOIDS (GC/LC/MS), URINE: THC-COOH (GC/LC/MS), ur confirm: 397 ng/mL — ABNORMAL HIGH (ref <5)

## 2014-12-28 ENCOUNTER — Ambulatory Visit: Payer: No Typology Code available for payment source | Admitting: Internal Medicine

## 2015-01-04 ENCOUNTER — Ambulatory Visit (INDEPENDENT_AMBULATORY_CARE_PROVIDER_SITE_OTHER): Payer: No Typology Code available for payment source | Admitting: Internal Medicine

## 2015-01-04 ENCOUNTER — Other Ambulatory Visit: Payer: No Typology Code available for payment source

## 2015-01-04 ENCOUNTER — Encounter: Payer: Self-pay | Admitting: Internal Medicine

## 2015-01-04 VITALS — BP 138/86 | HR 74 | Temp 98.4°F | Resp 16 | Ht 70.0 in | Wt 167.0 lb

## 2015-01-04 DIAGNOSIS — A609 Anogenital herpesviral infection, unspecified: Secondary | ICD-10-CM | POA: Diagnosis not present

## 2015-01-04 DIAGNOSIS — A6002 Herpesviral infection of other male genital organs: Secondary | ICD-10-CM

## 2015-01-04 DIAGNOSIS — A530 Latent syphilis, unspecified as early or late: Secondary | ICD-10-CM | POA: Diagnosis not present

## 2015-01-04 DIAGNOSIS — I10 Essential (primary) hypertension: Secondary | ICD-10-CM | POA: Diagnosis not present

## 2015-01-04 DIAGNOSIS — K219 Gastro-esophageal reflux disease without esophagitis: Secondary | ICD-10-CM | POA: Insufficient documentation

## 2015-01-04 MED ORDER — LANSOPRAZOLE 30 MG PO CPDR: 30.0000 mg | DELAYED_RELEASE_CAPSULE | Freq: Every day | ORAL | Status: DC

## 2015-01-04 MED ORDER — VALACYCLOVIR HCL 1 G PO TABS: 1000.0000 mg | ORAL_TABLET | Freq: Two times a day (BID) | ORAL | Status: DC

## 2015-01-04 NOTE — Progress Notes (Signed)
Subjective:  Patient ID: Harold Flores, male    DOB: 08/26/1990  Age: 24 y.o. MRN: [id number]  CC: Rash; Hypertension; and Gastrophageal Reflux   HPI Harold Flores presents for a follow up on persistent rash/"sores" on his butt and persistent heartburn that is not controlled by zantac, he wants to start prevacid again. His BP has been well controlled and he feels much better today. His recent labs shows that his RPR titer is down quite a bit. 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 The HIV test was not done because there wasn't enough specimen for it.  Outpatient Prescriptions Prior to Visit  Medication Sig Dispense Refill  . carvedilol (COREG) 6.25 MG tablet Take 1 tablet (6.25 mg total) by mouth 2 (two) times daily with a meal. 60 tablet 3  . Multiple Vitamins-Minerals (MENS MULTIVITAMIN PLUS) TABS Take by mouth.    . sertraline (ZOLOFT) 50 MG tablet Take 1 tablet (50 mg total) by mouth daily. 30 tablet 5  . ranitidine (ZANTAC) 150 MG tablet Take 300 mg by mouth daily.     No facility-administered medications prior to visit.    ROS Review of Systems  Constitutional: Negative.  Negative for fever, chills, diaphoresis, appetite change and fatigue.  HENT: Negative.  Negative for sore throat and voice change.   Eyes: Negative.   Respiratory: Negative.   Cardiovascular: Negative.  Negative for chest pain, palpitations and leg swelling.  Gastrointestinal: Negative.  Negative for nausea, vomiting, abdominal pain, diarrhea, constipation and blood in stool.  Endocrine: Negative.   Genitourinary: Negative.  Negative for decreased urine volume, difficulty urinating and genital sores.  Musculoskeletal: Negative.   Skin: Positive for rash.  Allergic/Immunologic: Negative.   Neurological: Negative.   Hematological: Negative.  Negative for adenopathy. Does not bruise/bleed easily.  Psychiatric/Behavioral: Negative.  Negative for sleep disturbance, dysphoric mood, decreased concentration  and agitation. The patient is not nervous/anxious and is not hyperactive.     Objective:  BP 138/86 mmHg  Pulse 74  Temp(Src) 98.4 F (36.9 C) (Oral)  Resp 16  Ht  (1.778 m)  Wt 167 lb (75.751 kg)  BMI 23.96 kg/m2  SpO2 99%  BP Readings from Last 3 Encounters:  01/04/15 138/86  12/21/14 160/88  09/09/14 170/100    Wt Readings from Last 3 Encounters:  01/04/15 167 lb (75.751 kg)  12/21/14 166 lb (75.297 kg)  09/09/14 170 lb 12 oz (77.452 kg)    Physical Exam  Constitutional: He is oriented to person, place, and time. No distress.  HENT:  Mouth/Throat: Oropharynx is clear and moist. No oropharyngeal exudate.  Eyes: Conjunctivae are normal. Right eye exhibits no discharge. Left eye exhibits no discharge. No scleral icterus.  Neck: Normal range of motion. Neck supple. No JVD present. No tracheal deviation present. No thyromegaly present.  Cardiovascular: Normal rate, regular rhythm, normal heart sounds and intact distal pulses.  Exam reveals no gallop and no friction rub.   No murmur heard. Pulmonary/Chest: Effort normal and breath sounds normal. No stridor. No respiratory distress. He has no wheezes. He has no rales. He exhibits no tenderness.  Abdominal: Soft. Bowel sounds are normal. He exhibits no distension and no mass. There is no tenderness. There is no rebound and no guarding.  Musculoskeletal: Normal range of motion. He exhibits no edema or tenderness.  Lymphadenopathy:    He has no cervical adenopathy.  Neurological: He is oriented to person, place, and time.  Skin: Skin is warm and dry. Rash  noted. He is not diaphoretic. No erythema. No pallor.  Psychiatric: He has a normal mood and affect. His behavior is normal. Judgment and thought content normal.    Lab Results  Component Value Date   WBC 6.9 12/21/2014   HGB 13.8 12/21/2014   HCT 42.4 12/21/2014   PLT 226.0 12/21/2014   GLUCOSE 114* 12/21/2014   GLUCOSE 114* 12/21/2014   CHOL 116 10/08/2012    TRIG 37.0 10/08/2012   HDL 33.10* 10/08/2012   LDLCALC 76 10/08/2012   ALT 8 12/21/2014   AST 13 12/21/2014   NA 141 12/21/2014   NA 141 12/21/2014   K 3.4* 12/21/2014   K 3.4* 12/21/2014   CL 104 12/21/2014   CL 104 12/21/2014   CREATININE 0.89 12/21/2014   CREATININE 0.89 12/21/2014   BUN 7 12/21/2014   BUN 7 12/21/2014   CO2 29 12/21/2014   CO2 29 12/21/2014   TSH 1.36 10/08/2012   HGBA1C 5.6 06/12/2009                                                                                                                                                 Ref Range 2wk ago  66yr ago  40yr ago     RPR Ser Ql NON REAC  REACTIVE (A) REACTIVE (A)CM NON REAC   Comments: ** CONFIRMATORY TESTING TO FOLLOW **   Resulting Agency  [facility]    Narrative     Performed at: [facility]        [address]        [place], [place] [address]       Specimen Collected: 12/21/14 4:11 PM Last Resulted: 12/21/14 10:42 PM             CM=Additional comments                                                                                                                         Frequency Duration Priority Order Class    None None Routine [facility]      Acc#    [id number]  Collected: 12/21/2014 4:11 PM   Resulting Agency: [facility]                 No results found.  Assessment & Plan:   Juan was seen today for rash, hypertension and gastrophageal reflux.  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 Orders: -     HIV  antibody; Future -     valACYclovir (VALTREX) 1000 MG tablet; Take 1 tablet (1,000 mg total) by mouth 2 (two) times daily.  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 Orders: -     HIV antibody; Future  Essential hypertension, benign- there is marked improvement in his blood pressure with the initiation of carvedilol  Gastroesophageal reflux disease without esophagitis- we'll change to Prevacid at his request. Orders: -     lansoprazole (PREVACID) 30 MG capsule; Take 1 capsule (30 mg total) by mouth daily at 12 noon.  I have discontinued Mr. Matton ranitidine. I am also having him start on lansoprazole and valACYclovir. Additionally, I am having him maintain his MENS MULTIVITAMIN PLUS, carvedilol, and sertraline.  Meds ordered this encounter  Medications  . lansoprazole (PREVACID) 30 MG capsule    Sig: Take 1 capsule (30 mg total) by mouth daily at 12 noon.    Dispense:  90 capsule    Refill:  3  . valACYclovir (VALTREX) 1000 MG tablet    Sig: Take 1 tablet (1,000 mg total) by mouth 2 (two) times daily.    Dispense:  20 tablet    Refill:  2     Follow-up: Return in about 4 months (around 05/07/2015).  Sanda Linger, MD

## 2015-01-04 NOTE — Progress Notes (Signed)
Pre visit review using our clinic review tool, if applicable. No additional management support is needed unless otherwise documented below in the visit note. 

## 2015-01-04 NOTE — Patient Instructions (Signed)
Genital Herpes °Genital herpes is a sexually transmitted disease. This means that it is a disease passed by having sex with an infected person. There is no cure for genital herpes. The time between attacks can be months to years. The virus may live in a person but produce no problems (symptoms). This infection can be passed to a baby as it travels down the birth canal (vagina). In a newborn, this can cause central nervous system damage, eye damage, or even death. The virus that causes genital herpes is usually HSV-2 virus. The virus that causes oral herpes is usually HSV-1. The diagnosis (learning what is wrong) is made through culture results. °SYMPTOMS  °Usually symptoms of pain and itching begin a few days to a week after contact. It first appears as small blisters that progress to small painful ulcers which then scab over and heal after several days. It affects the outer genitalia, birth canal, cervix, penis, anal area, buttocks, and thighs. °HOME CARE INSTRUCTIONS  °· Keep ulcerated areas dry and clean. °· Take medications as directed. Antiviral medications can speed up healing. They will not prevent recurrences or cure this infection. These medications can also be taken for suppression if there are frequent recurrences. °· While the infection is active, it is contagious. Avoid all sexual contact during active infections. °· Condoms may help prevent spread of the herpes virus. °· Practice safe sex. °· Wash your hands thoroughly after touching the genital area. °· Avoid touching your eyes after touching your genital area. °· Inform your caregiver if you have had genital herpes and become pregnant. It is your responsibility to insure a safe outcome for your baby in this pregnancy. °· Only take over-the-counter or prescription medicines for pain, discomfort, or fever as directed by your caregiver. °SEEK MEDICAL CARE IF:  °· You have a recurrence of this infection. °· You do not respond to medications and are not  improving. °· You have new sources of pain or discharge which have changed from the original infection. °· You have an oral temperature above 102° F (38.9° C). °· You develop abdominal pain. °· You develop eye pain or signs of eye infection. °Document Released: 05/24/2000 Document Revised: 08/19/2011 Document Reviewed: 06/14/2009 °ExitCare® Patient Information ©2015 [employer]. This information is not intended to replace advice given to you by your health care provider. Make sure you discuss any questions you have with your health care provider. ° °

## 2015-01-05 ENCOUNTER — Encounter: Payer: Self-pay | Admitting: Internal Medicine

## 2015-01-05 LAB — HIV ANTIBODY (ROUTINE TESTING W REFLEX): HIV 1&2 Ab, 4th Generation: REACTIVE — AB

## 2015-01-05 LAB — HIV 1/2 CONFIRMATION
HIV-1 antibody: POSITIVE — AB
HIV-2 Ab: NEGATIVE

## 2015-01-06 ENCOUNTER — Telehealth: Payer: Self-pay | Admitting: Family

## 2015-01-06 NOTE — Telephone Encounter (Signed)
Most certainly.

## 2015-01-06 NOTE — Telephone Encounter (Signed)
Pt would like to know if can transfer from [facility] to Dr Yetta Barre?    Is this ok, please advise?

## 2015-01-07 NOTE — Telephone Encounter (Signed)
yes

## 2015-01-09 ENCOUNTER — Encounter (HOSPITAL_BASED_OUTPATIENT_CLINIC_OR_DEPARTMENT_OTHER): Payer: No Typology Code available for payment source

## 2015-01-10 ENCOUNTER — Encounter (HOSPITAL_BASED_OUTPATIENT_CLINIC_OR_DEPARTMENT_OTHER): Payer: No Typology Code available for payment source

## 2015-01-10 NOTE — Telephone Encounter (Signed)
Pt aware. Appt made

## 2015-01-11 ENCOUNTER — Telehealth: Payer: Self-pay | Admitting: *Deleted

## 2015-01-11 NOTE — Telephone Encounter (Signed)
Received call from Aletha Halim w/ [facility] health Dept he stated that they received a positive HIV test on pt. Wanting to confirm lab. Also has pt been notified. Inform Denyse Amass pt saw md on 01/03/15 c/o of rash and sores. MD sent mychart msg to pt stating had abnormal need appt soon. Pt has made appt for tomorrow 01/12/15...Raechel Chute

## 2015-01-12 ENCOUNTER — Other Ambulatory Visit: Payer: Self-pay | Admitting: General Surgery

## 2015-01-12 ENCOUNTER — Encounter: Payer: Self-pay | Admitting: Internal Medicine

## 2015-01-12 ENCOUNTER — Ambulatory Visit (INDEPENDENT_AMBULATORY_CARE_PROVIDER_SITE_OTHER): Payer: No Typology Code available for payment source | Admitting: Internal Medicine

## 2015-01-12 ENCOUNTER — Other Ambulatory Visit: Payer: No Typology Code available for payment source

## 2015-01-12 VITALS — BP 142/92 | HR 83 | Temp 99.7°F | Resp 16 | Ht 70.0 in

## 2015-01-12 DIAGNOSIS — I10 Essential (primary) hypertension: Secondary | ICD-10-CM

## 2015-01-12 DIAGNOSIS — R75 Inconclusive laboratory evidence of human immunodeficiency virus [HIV]: Secondary | ICD-10-CM

## 2015-01-12 NOTE — Patient Instructions (Signed)
HIV Antibody Test °HIV is a virus which destroys our body's ability to fight illness. It does this by causing defects in our immune system. This is the system that protects our body against infections. This virus is the cause of an illness called AIDS. HIV antibodies are made by the infected person's body when a person becomes infected with HIV. °WHAT IS THE HIV ANTIBODY TEST? °This is a test for HIV antibodies that are found in the blood of an infected person. This test is not a test for AIDS. It only means that you have been infected with HIV and may eventually develop AIDS. °WHO IS AT RISK OF BEING INFECTED WITH HIV? °· People who have unsafe sex (unsafe sex means having sex without a condom (or other protective barrier) with a person who has the virus. °· People who share IV needles or syringes with a person who has the virus. °· Anyone who got blood, blood products, or organ transplants before 1985. °· Babies born to mothers who have HIV. °· Coming in contact with blood or other body fluids of someone infected with HIV. °WHAT DOES A NEGATIVE HIV TEST RESULT MEAN? °HIV antibodies were not found in your blood. Most of the time it takes our bodies between 6 weeks and 6 months to develop antibodies to HIV. It may take up to one year to develop. During this time, infected people can have a negative result even if they have the virus and will therefore not know if they are putting other people at risk. They should take all necessary precautions to protect others from becoming infected. °WHAT DOES A POSITIVE HIV TEST RESULT MEAN? °· A positive HIV test means a person has been infected with the HIV virus. This does NOT mean that a person has AIDS, but they may eventually develop it. °· A person can give HIV infection to other people through unsafe sex. Sharing IV needles or syringes can also spread HIV. °· A woman who has HIV can give the virus to her baby during pregnancy or at birth or possibly from breastfeeding.  Get counseling prior to considering pregnancy. One third to one half of women with HIV infection will pass this infection on to their baby. °· Anyone with a positive test for HIV should not donate blood, plasma, blood products, organs or tissues. °WHERE CAN I GO TO BE TESTED? °· Most county health departments offer HIV Antibody counseling and testing. °· Many doctors and other caregivers offer HIV Antibody counseling and testing. °If you received a blood test from your caregiver, call for your results as instructed. Remember it is your responsibility to get the results of your test. Do not assume everything is fine if you do not hear from your caregiver. If you get a positive test result, talk to your caregiver to find out what steps to take to assure you receive the best of care. Numerous medications are now available which improve the course of this infection. °Document Released: 05/24/2000 Document Revised: 08/19/2011 Document Reviewed: 08/27/2013 °ExitCare® Patient Information ©2015 [employer]. This information is not intended to replace advice given to you by your health care provider. Make sure you discuss any questions you have with your health care provider. ° °

## 2015-01-12 NOTE — Progress Notes (Signed)
Pre visit review using our clinic review tool, if applicable. No additional management support is needed unless otherwise documented below in the visit note. 

## 2015-01-12 NOTE — H&P (Signed)
Harold Flores 01/12/2015 10:23 AM Location: [facility] Patient #: [id number] DOB: [birth year] Single / Language: [place] / Race: Black or African American Male History of Present Illness Harold Levee MD; 01/12/2015 10:50 AM) Patient words: anal fissure.  The patient is a 24 year old male who presents with anal fistula. 24 year old male who states he had a large mass on his left buttocks approximately 8 months ago. This ruptured and drained purulence. Since that time, he has continued to have drainage in this area. He was treated for HSV as well. The patient states he has irregular bowel movements with some loose stools noted. Of note he was most recently diagnosed with possible HIV with an HIV antibody test being reactive. Other Problems Harold Bickers, LPN; 0/09/5407 81:19 AM) Asthma Gastroesophageal Reflux Disease High blood pressure HIV-positive  Past Surgical History Harold Bickers, LPN; 06/14/7827 56:21 AM) No pertinent past surgical history  Diagnostic Studies History Harold Bickers, LPN; 3/0/8657 84:69 AM) Colonoscopy never  Allergies Harold Bickers, LPN; 11/10/9526 41:32 AM) No Known Drug Allergies 01/12/2015  Medication History Harold Bickers, LPN; 09/11/100 72:53 AM) Carvedilol (6.25MG  Tablet, Oral) Active. Lansoprazole (  Capsule DR, Oral) Active. Sertraline HCl (  Tablet, Oral) Active. ValACYclovir HCl (1GM Tablet, Oral) Active. Medications Reconciled  Social History Harold Bickers, LPN; 11/13/4401 47:42 AM) Alcohol use Occasional alcohol use. No caffeine use No drug use  Family History Harold Bickers, LPN; 10/16/5636 75:64 AM) Arthritis Mother. Depression Mother. Diabetes Mellitus Father, Mother. Hypertension Mother. Ischemic Bowel Disease Mother. Migraine Headache Mother.     Review of Systems Harold Endo Dockery LPN; 08/10/2949 88:41 AM) General Not Present- Appetite Loss, Chills, Fatigue, Fever, Night Sweats, Weight Gain  and Weight Loss. Skin Present- Ulcer. Not Present- Change in Wart/Mole, Dryness, Hives, Jaundice, New Lesions, Non-Healing Wounds and Rash. HEENT Not Present- Earache, Hearing Loss, Hoarseness, Nose Bleed, Oral Ulcers, Ringing in the Ears, Seasonal Allergies, Sinus Pain, Sore Throat, Visual Disturbances, Wears glasses/contact lenses and Yellow Eyes. Respiratory Not Present- Bloody sputum, Chronic Cough, Difficulty Breathing, Snoring and Wheezing. Breast Not Present- Breast Mass, Breast Pain, Nipple Discharge and Skin Changes. Cardiovascular Not Present- Chest Pain, Difficulty Breathing Lying Down, Leg Cramps, Palpitations, Rapid Heart Rate, Shortness of Breath and Swelling of Extremities. Gastrointestinal Present- Rectal Pain. Not Present- Abdominal Pain, Bloating, Bloody Stool, Change in Bowel Habits, Chronic diarrhea, Constipation, Difficulty Swallowing, Excessive gas, Gets full quickly at meals, Hemorrhoids, Indigestion, Nausea and Vomiting. Musculoskeletal Not Present- Back Pain, Joint Pain, Joint Stiffness, Muscle Pain, Muscle Weakness and Swelling of Extremities. Psychiatric Present- Anxiety. Not Present- Bipolar, Change in Sleep Pattern, Depression, Fearful and Frequent crying. Endocrine Not Present- Cold Intolerance, Excessive Hunger, Hair Changes, Heat Intolerance, Hot flashes and New Diabetes. Hematology Present- HIV. Not Present- Easy Bruising, Excessive bleeding, Gland problems and Persistent Infections.  Vitals Harold Endo Dockery LPN; 11/13/628 16:01 AM) 01/12/2015 10:30 AM Weight: 167 lb Height: 70in Body Surface Area: 1.93 m Body Mass Index: 23.96 kg/m Temp.: 99.52F(Oral)  Pulse: 88 (Regular)  BP: 136/84 (Sitting, Left Arm, Standard)     Physical Exam Harold Levee MD; 01/12/2015 10:51 AM)  General Mental Status-Alert. General Appearance-Consistent with stated age. Hydration-Well hydrated. Voice-Normal.  Head and Neck Head-normocephalic, atraumatic with  no lesions or palpable masses. Trachea-midline. Thyroid Gland Characteristics - normal size and consistency.  Eye Eyeball - Bilateral-Extraocular movements intact. Sclera/Conjunctiva - Bilateral-No scleral icterus.  Chest and Lung Exam Chest and lung exam reveals -quiet, even and easy respiratory effort with no use of accessory muscles and on auscultation, normal breath  sounds, no adventitious sounds and normal vocal resonance. Inspection Chest Wall - Normal. Back - normal.  Cardiovascular Cardiovascular examination reveals -normal heart sounds, regular rate and rhythm with no murmurs and normal pedal pulses bilaterally.  Abdomen Inspection Inspection of the abdomen reveals - No Hernias. Palpation/Percussion Palpation and Percussion of the abdomen reveal - Soft, Non Tender, No Rebound tenderness, No Rigidity (guarding) and No hepatosplenomegaly. Auscultation Auscultation of the abdomen reveals - Bowel sounds normal.  Rectal Anorectal Exam External - Note: Left anterior external lesion with purulent secretions and palpable cord noted radially to the anal canal. A few small anal condyloma noted as well.  Neurologic Neurologic evaluation reveals -alert and oriented x 3 with no impairment of recent or remote memory. Mental Status-Normal.  Musculoskeletal Global Assessment -Note:no gross deformities.  Normal Exam - Left-Upper Extremity Strength Normal and Lower Extremity Strength Normal. Normal Exam - Right-Upper Extremity Strength Normal and Lower Extremity Strength Normal.    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 I have recommended an exam under anesthesia with possible seton placement versus fistulotomy. Given his recent diagnosis of possible HIV, I would most likely perform a seton placement until his T-cell titers are  known. Risk of surgery including bleeding and prolonged healing. If the fistulotomy is performed, there is a small risk of incontinence as well.  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 He is agreeable to undergo laser ablation at the same time.

## 2015-01-13 LAB — T-HELPER CELLS (CD4) COUNT (NOT AT ARMC)
Absolute CD4: 215 /uL — ABNORMAL LOW (ref 381–1469)
CD4 T Helper %: 8 % — ABNORMAL LOW (ref 32–62)
Total Lymphocyte: 48 % — ABNORMAL HIGH (ref 12–46)
Total lymphocyte count: 2688 /uL (ref 700–3300)
WBC, lymph enumeration: 5.6 10*3/uL (ref 4.0–10.5)

## 2015-01-13 NOTE — Assessment & Plan Note (Signed)
His blood pressures adequately well controlled.

## 2015-01-13 NOTE — Progress Notes (Signed)
Subjective:  Patient ID: Harold Flores, male    DOB: May 15, 1991  Age: 24 y.o. MRN: [id number]  CC: Hypertension   HPI BO TEICHER presents for a blood pressure check and also to discuss his recent HIV test. HIV-1 antibodies were positive. He admits that a year ago he had a partner that was very promiscuous. He recalls what sounds like was a conversion illness in December 2015. He experienced severe sore throat, lymphadenopathy, fever, and fatigue. He was so ill he missed 2 weeks of work and felt ill for about a month.  Outpatient Prescriptions Prior to Visit  Medication Sig Dispense Refill  . carvedilol (COREG) 6.25 MG tablet Take 1 tablet (6.25 mg total) by mouth 2 (two) times daily with a meal. 60 tablet 3  . lansoprazole (PREVACID) 30 MG capsule Take 1 capsule (30 mg total) by mouth daily at 12 noon. 90 capsule 3  . Multiple Vitamins-Minerals (MENS MULTIVITAMIN PLUS) TABS Take by mouth.    . sertraline (ZOLOFT) 50 MG tablet Take 1 tablet (50 mg total) by mouth daily. 30 tablet 5  . valACYclovir (VALTREX) 1000 MG tablet Take 1 tablet (1,000 mg total) by mouth 2 (two) times daily. 20 tablet 2   No facility-administered medications prior to visit.    ROS Review of Systems  Constitutional: Negative.  Negative for fever, chills, diaphoresis, appetite change and fatigue.  HENT: Negative.   Eyes: Negative.   Respiratory: Negative.  Negative for cough, choking, chest tightness and stridor.   Cardiovascular: Negative.  Negative for chest pain, palpitations and leg swelling.  Gastrointestinal: Negative.  Negative for nausea, vomiting, abdominal pain, diarrhea, constipation and blood in stool.  Endocrine: Negative.   Genitourinary: Negative.   Musculoskeletal: Negative.   Skin: Negative.  Negative for rash.  Allergic/Immunologic: Negative.   Neurological: Negative.   Hematological: Negative.  Negative for adenopathy. Does not bruise/bleed easily.  Psychiatric/Behavioral:  Negative.     Objective:  BP 142/92 mmHg  Pulse 83  Temp(Src) 99.7 F (37.6 C) (Oral)  Ht  (1.778 m)  SpO2 99%  BP Readings from Last 3 Encounters:  01/12/15 142/92  01/04/15 138/86  12/21/14 160/88    Wt Readings from Last 3 Encounters:  01/04/15 167 lb (75.751 kg)  12/21/14 166 lb (75.297 kg)  09/09/14 170 lb 12 oz (77.452 kg)    Physical Exam  Constitutional: He is oriented to person, place, and time. No distress.  HENT:  Mouth/Throat: Oropharynx is clear and moist. No oropharyngeal exudate.  Eyes: Conjunctivae are normal. Right eye exhibits no discharge. Left eye exhibits no discharge. No scleral icterus.  Neck: Normal range of motion. Neck supple. No JVD present. No tracheal deviation present. No thyromegaly present.  Cardiovascular: Normal rate, regular rhythm, normal heart sounds and intact distal pulses.  Exam reveals no gallop and no friction rub.   No murmur heard. Pulmonary/Chest: Effort normal and breath sounds normal. No stridor. No respiratory distress. He has no wheezes. He has no rales. He exhibits no tenderness.  Abdominal: Soft. Bowel sounds are normal. He exhibits no distension and no mass. There is no tenderness. There is no rebound and no guarding.  Musculoskeletal: Normal range of motion. He exhibits no edema or tenderness.  Lymphadenopathy:    He has no cervical adenopathy.  Neurological: He is oriented to person, place, and time.  Skin: Skin is warm and dry. No rash noted. He is not diaphoretic. No erythema. No pallor.    Lab Results  Component Value Date   WBC 6.9 12/21/2014   HGB 13.8 12/21/2014   HCT 42.4 12/21/2014   PLT 226.0 12/21/2014   GLUCOSE 114* 12/21/2014   GLUCOSE 114* 12/21/2014   CHOL 116 10/08/2012   TRIG 37.0 10/08/2012   HDL 33.10* 10/08/2012   LDLCALC 76 10/08/2012   ALT 8 12/21/2014   AST 13 12/21/2014   NA 141 12/21/2014   NA 141 12/21/2014   K 3.4* 12/21/2014   K 3.4* 12/21/2014   CL 104 12/21/2014   CL 104  12/21/2014   CREATININE 0.89 12/21/2014   CREATININE 0.89 12/21/2014   BUN 7 12/21/2014   BUN 7 12/21/2014   CO2 29 12/21/2014   CO2 29 12/21/2014   TSH 1.36 10/08/2012   HGBA1C 5.6 06/12/2009    No results found.  Assessment & Plan:   Arael was seen today for hypertension.  Diagnoses and all orders for this visit:  Nonspecific serologic evidence of human immunodeficiency virus (HIV)- I will confirm the HIV infection with an HIV-1 RNA viral load. I will also check his T-cell count. I have asked him to be seen by infectious disease to start anti-retroviral therapy. Orders: -     Cancel: HIV-1 RNA, Qualitative, TMA; Future -     Cancel: T-helper cells (CD4) count; Future -     Ambulatory referral to Infectious Disease -     T-helper cells (CD4) count; Future -     HIV-1 RNA, Qualitative, TMA; Future   I am having Mr. Gadd maintain his MENS MULTIVITAMIN PLUS, carvedilol, sertraline, lansoprazole, and valACYclovir.  No orders of the defined types were placed in this encounter.     Follow-up: Return in about 4 months (around 05/14/2015).  Sanda Linger, MD

## 2015-01-19 ENCOUNTER — Encounter: Payer: Self-pay | Admitting: Internal Medicine

## 2015-01-19 LAB — HIV-1 RNA, QUALITATIVE, TMA: HIV-1 RNA, Qualitative, TMA: DETECTED — AB

## 2015-02-01 ENCOUNTER — Telehealth: Payer: Self-pay

## 2015-02-01 NOTE — Telephone Encounter (Signed)
Patient contacted regarding new intake appointment. Date and time given. Information given regarding documents needed to qualify for financial eligibility.  Tammy K King, RN  

## 2015-02-02 ENCOUNTER — Ambulatory Visit (INDEPENDENT_AMBULATORY_CARE_PROVIDER_SITE_OTHER): Payer: No Typology Code available for payment source

## 2015-02-02 DIAGNOSIS — R634 Abnormal weight loss: Secondary | ICD-10-CM

## 2015-02-02 DIAGNOSIS — E785 Hyperlipidemia, unspecified: Secondary | ICD-10-CM

## 2015-02-02 DIAGNOSIS — Z23 Encounter for immunization: Secondary | ICD-10-CM | POA: Diagnosis not present

## 2015-02-02 DIAGNOSIS — B20 Human immunodeficiency virus [HIV] disease: Secondary | ICD-10-CM | POA: Diagnosis not present

## 2015-02-02 DIAGNOSIS — Z113 Encounter for screening for infections with a predominantly sexual mode of transmission: Secondary | ICD-10-CM

## 2015-02-02 LAB — CBC WITH DIFFERENTIAL/PLATELET
Basophils Absolute: 0 10*3/uL (ref 0.0–0.1)
Basophils Relative: 0 % (ref 0–1)
Eosinophils Absolute: 0.2 10*3/uL (ref 0.0–0.7)
Eosinophils Relative: 4 % (ref 0–5)
HCT: 38.3 % — ABNORMAL LOW (ref 39.0–52.0)
Hemoglobin: 12.9 g/dL — ABNORMAL LOW (ref 13.0–17.0)
Lymphocytes Relative: 49 % — ABNORMAL HIGH (ref 12–46)
Lymphs Abs: 2.5 10*3/uL (ref 0.7–4.0)
MCH: 26.3 pg (ref 26.0–34.0)
MCHC: 33.7 g/dL (ref 30.0–36.0)
MCV: 78.2 fL (ref 78.0–100.0)
MPV: 10.1 fL (ref 8.6–12.4)
Monocytes Absolute: 0.7 10*3/uL (ref 0.1–1.0)
Monocytes Relative: 13 % — ABNORMAL HIGH (ref 3–12)
Neutro Abs: 1.7 10*3/uL (ref 1.7–7.7)
Neutrophils Relative %: 34 % — ABNORMAL LOW (ref 43–77)
Platelets: 246 10*3/uL (ref 150–400)
RBC: 4.9 MIL/uL (ref 4.22–5.81)
RDW: 16.7 % — ABNORMAL HIGH (ref 11.5–15.5)
WBC: 5 10*3/uL (ref 4.0–10.5)

## 2015-02-02 LAB — COMPLETE METABOLIC PANEL WITH GFR
ALT: 7 U/L — ABNORMAL LOW (ref 9–46)
AST: 15 U/L (ref 10–40)
Albumin: 4.2 g/dL (ref 3.6–5.1)
Alkaline Phosphatase: 72 U/L (ref 40–115)
BUN: 8 mg/dL (ref 7–25)
CO2: 28 mmol/L (ref 20–31)
Calcium: 9 mg/dL (ref 8.6–10.3)
Chloride: 104 mmol/L (ref 98–110)
Creat: 0.74 mg/dL (ref 0.60–1.35)
GFR, Est African American: >89 mL/min (ref >=60)
GFR, Est Non African American: >89 mL/min (ref >=60)
Glucose, Bld: 75 mg/dL (ref 65–99)
Potassium: 3.6 mmol/L (ref 3.5–5.3)
Sodium: 142 mmol/L (ref 135–146)
Total Bilirubin: 0.8 mg/dL (ref 0.2–1.2)
Total Protein: 6.8 g/dL (ref 6.1–8.1)

## 2015-02-02 LAB — LIPID PANEL
Cholesterol: 114 mg/dL — ABNORMAL LOW (ref 125–200)
HDL: 40 mg/dL (ref >=40)
LDL Cholesterol: 64 mg/dL (ref <130)
Total CHOL/HDL Ratio: 2.9 Ratio (ref <=5.0)
Triglycerides: 49 mg/dL (ref <150)
VLDL: 10 mg/dL (ref <30)

## 2015-02-03 LAB — HIV-1 RNA ULTRAQUANT REFLEX TO GENTYP+
HIV 1 RNA Quant: 181055 copies/mL — ABNORMAL HIGH (ref <20)
HIV-1 RNA Quant, Log: 5.26 {Log} — ABNORMAL HIGH (ref <1.30)

## 2015-02-03 LAB — URINALYSIS
Bilirubin Urine: NEGATIVE
Glucose, UA: NEGATIVE
Ketones, ur: NEGATIVE
Leukocytes, UA: NEGATIVE
Nitrite: NEGATIVE
Protein, ur: NEGATIVE
Specific Gravity, Urine: 1.025 (ref 1.001–1.035)
pH: 6 (ref 5.0–8.0)

## 2015-02-03 LAB — RPR TITER: RPR Titer: 1:1 {titer}

## 2015-02-03 LAB — T-HELPER CELL (CD4) - (RCID CLINIC ONLY)
CD4 % Helper T Cell: 10 % — ABNORMAL LOW (ref 33–55)
CD4 T Cell Abs: 230 /uL — ABNORMAL LOW (ref 400–2700)

## 2015-02-03 LAB — RPR: RPR Ser Ql: REACTIVE — AB

## 2015-02-03 LAB — HEPATITIS B CORE ANTIBODY, TOTAL: Hep B Core Total Ab: NONREACTIVE

## 2015-02-03 LAB — URINE CYTOLOGY ANCILLARY ONLY
Chlamydia: NEGATIVE
Neisseria Gonorrhea: NEGATIVE

## 2015-02-03 LAB — HEPATITIS B SURFACE ANTIGEN: Hepatitis B Surface Ag: NEGATIVE

## 2015-02-03 LAB — HEPATITIS B SURFACE ANTIBODY,QUALITATIVE: Hep B S Ab: POSITIVE — AB

## 2015-02-03 LAB — HEPATITIS C ANTIBODY: HCV Ab: NEGATIVE

## 2015-02-03 LAB — FLUORESCENT TREPONEMAL AB(FTA)-IGG-BLD: Fluorescent Treponemal ABS: REACTIVE — AB

## 2015-02-03 LAB — HEPATITIS A ANTIBODY, TOTAL: Hep A Total Ab: NONREACTIVE

## 2015-02-04 LAB — QUANTIFERON TB GOLD ASSAY (BLOOD)
Interferon Gamma Release Assay: NEGATIVE
Mitogen value: 1.16 IU/mL
Quantiferon Nil Value: 0.11 IU/mL
Quantiferon Tb Ag Minus Nil Value: -0.03 IU/mL
TB Ag value: 0.08 IU/mL

## 2015-02-06 DIAGNOSIS — B20 Human immunodeficiency virus [HIV] disease: Secondary | ICD-10-CM

## 2015-02-08 DIAGNOSIS — Z8619 Personal history of other infectious and parasitic diseases: Secondary | ICD-10-CM | POA: Insufficient documentation

## 2015-02-08 NOTE — Progress Notes (Signed)
Patient was referred by Dr. Sanda Linger after testing positive for HIV. He gives history of last negative HIV test in June, 2014. He is currently having issues with anal fistula and hopes to see surgeon soon.  History of unintentional weight loss of 20 pounds, rash on upper and lower body at times , nausea and diarrhea within the last few months.    Condom use reported as sometimes and has no  specific way of deciding when to use a condom.  Last unprotected intercourse was with a known acquaintance whose HIV status was unknown. Patient has shared HIV diagnosis with his Mother and has good support.  She will accompany patient at next schedule visit with Dr. Drue Second.  He is eager to start medications.  No tattoos or piercings. Vaccines updated.   Laurell Josephs, RN

## 2015-02-10 LAB — HLA B*5701: HLA-B*5701 w/rflx HLA-B High: NEGATIVE

## 2015-02-14 LAB — HIV-1 GENOTYPR PLUS

## 2015-02-17 ENCOUNTER — Ambulatory Visit: Payer: Self-pay | Admitting: Internal Medicine

## 2015-02-21 ENCOUNTER — Ambulatory Visit: Payer: Self-pay | Admitting: Internal Medicine

## 2015-02-22 ENCOUNTER — Ambulatory Visit: Payer: Self-pay | Admitting: Internal Medicine

## 2015-02-22 ENCOUNTER — Ambulatory Visit (INDEPENDENT_AMBULATORY_CARE_PROVIDER_SITE_OTHER): Payer: No Typology Code available for payment source | Admitting: Internal Medicine

## 2015-02-22 ENCOUNTER — Encounter: Payer: Self-pay | Admitting: Internal Medicine

## 2015-02-22 VITALS — BP 184/116 | HR 80 | Temp 98.9°F | Ht 70.0 in | Wt 169.0 lb

## 2015-02-22 DIAGNOSIS — Z21 Asymptomatic human immunodeficiency virus [HIV] infection status: Secondary | ICD-10-CM | POA: Insufficient documentation

## 2015-02-22 DIAGNOSIS — A609 Anogenital herpesviral infection, unspecified: Secondary | ICD-10-CM | POA: Diagnosis not present

## 2015-02-22 DIAGNOSIS — A6002 Herpesviral infection of other male genital organs: Secondary | ICD-10-CM

## 2015-02-22 DIAGNOSIS — B20 Human immunodeficiency virus [HIV] disease: Secondary | ICD-10-CM | POA: Diagnosis not present

## 2015-02-22 MED ORDER — ELVITEG-COBIC-EMTRICIT-TENOFAF 150-150-200-10 MG PO TABS: 1.0000 | ORAL_TABLET | Freq: Every day | ORAL | Status: DC

## 2015-02-22 MED ORDER — VALACYCLOVIR HCL 1 G PO TABS: 1000.0000 mg | ORAL_TABLET | Freq: Two times a day (BID) | ORAL | Status: DC

## 2015-02-22 NOTE — Progress Notes (Signed)
Patient ID: Harold Flores, male    DOB: 02/25/1991, 24 y.o.   MRN: [id number]  HPI:   He is here to establish care for hIV.  Recently diagnosed.  Endorses MSM, though also women.  Asks about having a family in the future.  Interested in starting medication.  On medication for HTN.  Does smoke.  Takes medication daily and no missing doses.  Has rectal fistula he is hoping to get surgically repaired but on hold until he gets on medication.  CD4 of 230.  Asks about having children in the future.   Past Medical History  Diagnosis Date  . Asthma   . Varicella   . Epistaxis   . GERD (gastroesophageal reflux disease)   . Hemorrhoid   . Hypertension   . Anal fissure   . Anal ulcer   . Anal fistula   . Syphilis   . GAD (generalized anxiety disorder)   . HIV (human immunodeficiency virus infection)     Prior to Admission medications   Medication Sig Start Date End Date Taking? Authorizing Provider  carvedilol (COREG) 6.25 MG tablet Take 1 tablet (6.25 mg total) by mouth 2 (two) times daily with a meal. 12/21/14  Yes Etta Grandchild, MD  lansoprazole (PREVACID) 30 MG capsule Take 1 capsule (30 mg total) by mouth daily at 12 noon. 01/04/15  Yes Etta Grandchild, MD  Multiple Vitamins-Minerals (MENS MULTIVITAMIN PLUS) TABS Take by mouth.   Yes Historical Provider, MD  sertraline (ZOLOFT) 50 MG tablet Take 1 tablet (50 mg total) by mouth daily. 12/21/14  Yes Etta Grandchild, MD  elvitegravir-cobicistat-emtricitabine-tenofovir (GENVOYA) 150-150-200-10 MG TABS tablet Take 1 tablet by mouth daily. 02/22/15   Gardiner Barefoot, MD  valACYclovir (VALTREX) 1000 MG tablet Take 1 tablet (1,000 mg total) by mouth 2 (two) times daily. Patient not taking: Reported on 02/02/2015 01/04/15   Etta Grandchild, MD    No Known Allergies  Social History  Substance Use Topics  . Smoking status: Current Every Day Smoker -- 0.50 packs/day for 5 years    Types: E-cigarettes, Cigarettes  . Smokeless tobacco: Never Used  .  Alcohol Use: 0.0 oz/week    0 Cans of beer per week     Comment: Rarely    Family History  Problem Relation Age of Onset  . Diabetes Mother   . Hyperlipidemia Mother   . Diabetes Maternal Grandmother   . Heart disease Paternal Grandfather   . Cancer Neg Hx     Review of Systems A comprehensive review of systems was negative except for: Genitourinary: positive for pain at area of fistula and HSV, resolving now though   Filed Vitals:   02/22/15 1420  BP: 184/116  Pulse: 80  Temp: 98.9 F (37.2 C)   GEN:in no apparent distress and alert HEENT: anicteric CARD:Cor RRR and No murmurs LUNG:clear WU:JWJXB sounds are normal, liver is not enlarged, spleen is not enlarged JYN:WGNFAOZHYQ pulses normal, no pedal edema, no clubbing or cyanosis SKIN: negative for - jaundice, spider hemangioma, telangiectasia, palmar erythema, ecchymosis and atrophy MS: no joint swelling  Lab Results  Component Value Date   HIV1RNAQUANT 181055* 02/02/2015   No components found for: HIV1GENOTYPRPLUS No components found for: THELPERCELL  Assessment: new HIV patient.  Discussed with patient treatment options and side effects, benefits of treatment, long term outcomes.  Discussed needing to use condoms, partner disclosure, necessary vaccines, blood monitoring.  All questions answered.    Plan: 1) Start Panola, labs  in 4 weeks.Discussed nausea, renal and bone density effects long term 2) condoms 3) discussed PrEP for any future partner, both male and male, and that a male partner can get pregnant.   4) stop smoking

## 2015-03-08 ENCOUNTER — Telehealth: Payer: Self-pay | Admitting: *Deleted

## 2015-03-08 ENCOUNTER — Other Ambulatory Visit: Payer: Self-pay | Admitting: Internal Medicine

## 2015-03-08 ENCOUNTER — Telehealth: Payer: Self-pay | Admitting: Internal Medicine

## 2015-03-08 MED ORDER — ELVITEG-COBIC-EMTRICIT-TENOFDF 150-150-200-300 MG PO TABS: 1.0000 | ORAL_TABLET | Freq: Every day | ORAL | Status: DC

## 2015-03-08 NOTE — Telephone Encounter (Signed)
Patient's insurance will not cover Genvoya even without prior authorization. Insurance wants a cheaper alternative. Please advise

## 2015-03-08 NOTE — Telephone Encounter (Signed)
PA received for Stribild, completed and faxed for approval.

## 2015-03-08 NOTE — Telephone Encounter (Signed)
Maybe Kathie Rhodes can help with this and get it through [facility]?  Stribild, Tivicay + Truvada or triumeq?

## 2015-03-09 ENCOUNTER — Other Ambulatory Visit: Payer: Self-pay | Admitting: Internal Medicine

## 2015-03-09 MED ORDER — ABACAVIR-DOLUTEGRAVIR-LAMIVUD 600-50-300 MG PO TABS: 1.0000 | ORAL_TABLET | Freq: Every day | ORAL | Status: DC

## 2015-03-13 NOTE — Telephone Encounter (Signed)
He was able to get Triumeq

## 2015-03-27 ENCOUNTER — Other Ambulatory Visit: Payer: No Typology Code available for payment source

## 2015-03-27 DIAGNOSIS — B20 Human immunodeficiency virus [HIV] disease: Secondary | ICD-10-CM

## 2015-03-27 LAB — COMPLETE METABOLIC PANEL WITH GFR
ALT: 7 U/L — ABNORMAL LOW (ref 9–46)
AST: 13 U/L (ref 10–40)
Albumin: 4.2 g/dL (ref 3.6–5.1)
Alkaline Phosphatase: 65 U/L (ref 40–115)
BUN: 7 mg/dL (ref 7–25)
CO2: 27 mmol/L (ref 20–31)
Calcium: 9.1 mg/dL (ref 8.6–10.3)
Chloride: 105 mmol/L (ref 98–110)
Creat: 0.89 mg/dL (ref 0.60–1.35)
GFR, Est African American: >89 mL/min (ref >=60)
GFR, Est Non African American: >89 mL/min (ref >=60)
Glucose, Bld: 89 mg/dL (ref 65–99)
Potassium: 4.2 mmol/L (ref 3.5–5.3)
Sodium: 142 mmol/L (ref 135–146)
Total Bilirubin: 0.6 mg/dL (ref 0.2–1.2)
Total Protein: 7.3 g/dL (ref 6.1–8.1)

## 2015-03-27 LAB — CBC WITH DIFFERENTIAL/PLATELET
Basophils Absolute: 0 10*3/uL (ref 0.0–0.1)
Basophils Relative: 0 % (ref 0–1)
Eosinophils Absolute: 0.3 10*3/uL (ref 0.0–0.7)
Eosinophils Relative: 5 % (ref 0–5)
HCT: 41.2 % (ref 39.0–52.0)
Hemoglobin: 14.1 g/dL (ref 13.0–17.0)
Lymphocytes Relative: 46 % (ref 12–46)
Lymphs Abs: 3 10*3/uL (ref 0.7–4.0)
MCH: 27.1 pg (ref 26.0–34.0)
MCHC: 34.2 g/dL (ref 30.0–36.0)
MCV: 79.2 fL (ref 78.0–100.0)
MPV: 10.5 fL (ref 8.6–12.4)
Monocytes Absolute: 1 10*3/uL (ref 0.1–1.0)
Monocytes Relative: 15 % — ABNORMAL HIGH (ref 3–12)
Neutro Abs: 2.2 10*3/uL (ref 1.7–7.7)
Neutrophils Relative %: 34 % — ABNORMAL LOW (ref 43–77)
Platelets: 253 10*3/uL (ref 150–400)
RBC: 5.2 MIL/uL (ref 4.22–5.81)
RDW: 16 % — ABNORMAL HIGH (ref 11.5–15.5)
WBC: 6.6 10*3/uL (ref 4.0–10.5)

## 2015-03-28 LAB — T-HELPER CELL (CD4) - (RCID CLINIC ONLY)
CD4 % Helper T Cell: 11 % — ABNORMAL LOW (ref 33–55)
CD4 T Cell Abs: 320 /uL — ABNORMAL LOW (ref 400–2700)

## 2015-03-30 LAB — HIV-1 RNA QUANT-NO REFLEX-BLD
HIV 1 RNA Quant: 115 copies/mL — ABNORMAL HIGH (ref <20)
HIV-1 RNA Quant, Log: 2.06 Log copies/mL — ABNORMAL HIGH (ref <1.30)

## 2015-04-03 ENCOUNTER — Telehealth: Payer: Self-pay | Admitting: *Deleted

## 2015-04-03 NOTE — Telephone Encounter (Signed)
Patient called c/o blood in urine and dysuria. He took Azo otc which turns his urine orange, so he wasn't sure if it was just discolored. He said he also has a fistula which makes urination painful also. Advised will send a note to MD and call him back if a urine sample is needed and with further advice. Wendall MolaJacqueline Cockerham

## 2015-04-03 NOTE — Telephone Encounter (Signed)
Patient notified

## 2015-04-03 NOTE — Telephone Encounter (Signed)
Probably ok.  We can do a urine sample if it persists or he can see his PCP. thanks

## 2015-05-01 ENCOUNTER — Ambulatory Visit (INDEPENDENT_AMBULATORY_CARE_PROVIDER_SITE_OTHER): Payer: No Typology Code available for payment source | Admitting: Internal Medicine

## 2015-05-01 ENCOUNTER — Encounter: Payer: Self-pay | Admitting: Internal Medicine

## 2015-05-01 VITALS — BP 174/117 | HR 71 | Temp 99.1°F | Wt 174.0 lb

## 2015-05-01 DIAGNOSIS — Z23 Encounter for immunization: Secondary | ICD-10-CM

## 2015-05-01 DIAGNOSIS — Z8619 Personal history of other infectious and parasitic diseases: Secondary | ICD-10-CM | POA: Diagnosis not present

## 2015-05-01 DIAGNOSIS — B20 Human immunodeficiency virus [HIV] disease: Secondary | ICD-10-CM | POA: Diagnosis not present

## 2015-05-01 DIAGNOSIS — K602 Anal fissure, unspecified: Secondary | ICD-10-CM | POA: Diagnosis not present

## 2015-05-01 NOTE — Assessment & Plan Note (Signed)
Doing well. Will recheck in 3 months.   

## 2015-05-01 NOTE — Progress Notes (Signed)
   Subjective:    Patient ID: Harold Flores, male    DOB: 12/25/1990, 24 y.o.   MRN: [id number]  HPI Here for follow up of HIV.  He was seen last as a new patient with a CD4 of 230 and viral load of 181,055.  I prescribed him Genvoya but ended up with Triumeq based on his insurance plan.  He started this over 1 month ago and pleased with the medication.  No n/v/d.  Asymptomatic.  Takes daily with no missed doses.  CD4 up to 320 and viral load now just 115 copies.  No issues.  Has an anal fistula and was seeing Dr. Clovis PuA Thomas at [facility] and waiting for him to get his virus under control.     Review of Systems  Constitutional: Negative for fatigue.  Gastrointestinal: Negative for nausea and diarrhea.  Skin: Negative for rash.  Neurological: Negative for dizziness and light-headedness.       Objective:   Physical Exam  Constitutional: He appears well-developed and well-nourished. No distress.  HENT:  Mouth/Throat: No oropharyngeal exudate.  Eyes: No scleral icterus.  Cardiovascular: Normal rate, regular rhythm and normal heart sounds.   No murmur heard. Pulmonary/Chest: Effort normal and breath sounds normal. No respiratory distress.  Lymphadenopathy:    He has no cervical adenopathy.  Skin: No rash noted.   Social History   Social History  . Marital Status: Single    Spouse Name: N/A  . Number of Children: 0  . Years of Education: 13   Occupational History  . [employer]    Social History Main Topics  . Smoking status: Current Every Day Smoker -- 0.50 packs/day for 5 years    Types: E-cigarettes, Cigarettes  . Smokeless tobacco: Never Used  . Alcohol Use: 0.0 oz/week    0 Cans of beer per week     Comment: Rarely  . Drug Use: Yes    Special: Marijuana  . Sexual Activity:    Partners: Male    Birth Control/ Protection: Condom     Comment: pt. declined condoms   Other Topics Concern  . Not on file   Social History Narrative   Lives at home   A student   architectural games on computer   Fun: Music   Denies religious beliefs that would effect health care.          Assessment & Plan:

## 2015-05-01 NOTE — Assessment & Plan Note (Signed)
Ok from an ID standpoint for surgery.  I will let Dr. Maisie Fushomas know.

## 2015-05-01 NOTE — Assessment & Plan Note (Signed)
Will recheck his rpr next visit.

## 2015-05-06 ENCOUNTER — Other Ambulatory Visit: Payer: Self-pay

## 2015-05-06 DIAGNOSIS — I10 Essential (primary) hypertension: Secondary | ICD-10-CM

## 2015-05-06 MED ORDER — CARVEDILOL 6.25 MG PO TABS: 6.2500 mg | ORAL_TABLET | Freq: Two times a day (BID) | ORAL | Status: DC

## 2015-06-20 ENCOUNTER — Telehealth: Payer: Self-pay | Admitting: *Deleted

## 2015-06-20 DIAGNOSIS — B20 Human immunodeficiency virus [HIV] disease: Secondary | ICD-10-CM

## 2015-06-20 MED ORDER — ABACAVIR-DOLUTEGRAVIR-LAMIVUD 600-50-300 MG PO TABS: 1.0000 | ORAL_TABLET | Freq: Every day | ORAL | Status: DC

## 2015-06-20 NOTE — Telephone Encounter (Signed)
Sent prescription to mail order pharmacy, [phone].  Left pt a detailed message about calling [facility] to set up an account and check to see whether his co-pay card will work through [facility].  Let the patient know that if there is a problem with using the co-pay he can call [facility] to find out about reimbursement from the pharmaceutical company.

## 2015-06-21 ENCOUNTER — Ambulatory Visit: Payer: Self-pay

## 2015-07-07 ENCOUNTER — Other Ambulatory Visit: Payer: Self-pay | Admitting: Internal Medicine

## 2015-07-26 ENCOUNTER — Other Ambulatory Visit: Payer: 59

## 2015-07-26 ENCOUNTER — Other Ambulatory Visit: Payer: Self-pay

## 2015-07-26 DIAGNOSIS — B20 Human immunodeficiency virus [HIV] disease: Secondary | ICD-10-CM

## 2015-07-26 DIAGNOSIS — Z8619 Personal history of other infectious and parasitic diseases: Secondary | ICD-10-CM

## 2015-07-27 LAB — HIV-1 RNA QUANT-NO REFLEX-BLD
HIV 1 RNA Quant: <20 copies/mL (ref <20)
HIV-1 RNA Quant, Log: <1.3 Log copies/mL (ref <1.30)

## 2015-07-27 LAB — RPR: RPR Ser Ql: REACTIVE — AB

## 2015-07-27 LAB — T-HELPER CELL (CD4) - (RCID CLINIC ONLY)
CD4 % Helper T Cell: 14 % — ABNORMAL LOW (ref 33–55)
CD4 T Cell Abs: 400 /uL (ref 400–2700)

## 2015-07-27 LAB — RPR TITER: RPR Titer: 1:1 {titer}

## 2015-07-28 LAB — FLUORESCENT TREPONEMAL AB(FTA)-IGG-BLD: Fluorescent Treponemal ABS: REACTIVE — AB

## 2015-08-10 ENCOUNTER — Encounter: Payer: Self-pay | Admitting: Internal Medicine

## 2015-08-10 ENCOUNTER — Ambulatory Visit (INDEPENDENT_AMBULATORY_CARE_PROVIDER_SITE_OTHER): Payer: 59 | Admitting: Internal Medicine

## 2015-08-10 VITALS — BP 171/117 | HR 81 | Temp 98.1°F | Wt 173.0 lb

## 2015-08-10 DIAGNOSIS — K602 Anal fissure, unspecified: Secondary | ICD-10-CM

## 2015-08-10 DIAGNOSIS — B20 Human immunodeficiency virus [HIV] disease: Secondary | ICD-10-CM | POA: Diagnosis not present

## 2015-08-11 NOTE — Progress Notes (Signed)
   Subjective:    Patient ID: Harold Flores A Mednick, male    DOB: 07/12/1990, 25 y.o.   MRN: [id number]  HPI Here for follow up of HIV.  he is on Triumeq based on his insurance plan and doing well.  He is pleased with the regimen and now is undetectable with a CD4 up to 400.  Asymptomatic.  Takes daily with no missed doses.   Went back to see Dr. Maisie Fushomas and she felt he no longer needed surgery and he is happy about that.  Has one stable partner who is negative and interested inPrEP for him.    Review of Systems  Constitutional: Negative for fatigue.  Gastrointestinal: Negative for nausea and diarrhea.  Skin: Negative for rash.  Neurological: Negative for dizziness and light-headedness.       Objective:   Physical Exam  Constitutional: He appears well-developed and well-nourished. No distress.  HENT:  Mouth/Throat: No oropharyngeal exudate.  Eyes: No scleral icterus.  Cardiovascular: Normal rate, regular rhythm and normal heart sounds.   No murmur heard. Pulmonary/Chest: Effort normal and breath sounds normal. No respiratory distress.  Lymphadenopathy:    He has no cervical adenopathy.  Skin: No rash noted.   Social History   Social History  . Marital Status: Single    Spouse Name: N/A  . Number of Children: 0  . Years of Education: 13   Occupational History  . [employer]    Social History Main Topics  . Smoking status: Current Every Day Smoker -- 0.50 packs/day for 5 years    Types: E-cigarettes, Cigarettes  . Smokeless tobacco: Never Used  . Alcohol Use: 0.0 oz/week    0 Cans of beer per week     Comment: Rarely  . Drug Use: Yes    Special: Marijuana  . Sexual Activity:    Partners: Male    Birth Control/ Protection: Condom     Comment: pt. declined condoms   Other Topics Concern  . Not on file   Social History Narrative   Lives at home   A student   architectural games on computer   Fun: Music   Denies religious beliefs that would effect health care.           Assessment & Plan:

## 2015-08-11 NOTE — Assessment & Plan Note (Signed)
No surgery needed at this time by his report.

## 2015-08-11 NOTE — Assessment & Plan Note (Signed)
Doing great.  RTC 4 months.   

## 2015-09-08 ENCOUNTER — Other Ambulatory Visit: Payer: Self-pay | Admitting: Internal Medicine

## 2015-12-08 ENCOUNTER — Other Ambulatory Visit: Payer: Self-pay | Admitting: Internal Medicine

## 2016-01-07 ENCOUNTER — Other Ambulatory Visit: Payer: Self-pay | Admitting: Internal Medicine

## 2016-02-14 ENCOUNTER — Telehealth: Payer: Self-pay | Admitting: *Deleted

## 2016-02-26 MED ORDER — LANSOPRAZOLE 30 MG PO CPDR: DELAYED_RELEASE_CAPSULE | ORAL | 0 refills | Status: DC

## 2016-02-26 MED ORDER — CARVEDILOL 6.25 MG PO TABS: ORAL_TABLET | ORAL | 0 refills | Status: DC

## 2016-02-26 MED ORDER — SERTRALINE HCL 50 MG PO TABS: 50.0000 mg | ORAL_TABLET | Freq: Every day | ORAL | 0 refills | Status: DC

## 2016-02-26 NOTE — Telephone Encounter (Signed)
Pt was wondering if we can send in Rx for all 3 meds that he request. Pt is out of these med and he has an appt with Dr. Yetta BarreJones on 03/11/16. Please help

## 2016-02-26 NOTE — Telephone Encounter (Signed)
Can only send 30 day to local pharmacy until appt. rx's was sent to [facility].../[facility]

## 2016-02-26 NOTE — Addendum Note (Signed)
Addended by: Deatra JamesBRAND, Britiney Blahnik M on: 02/26/2016 10:57 AM   Modules accepted: Orders

## 2016-02-27 ENCOUNTER — Telehealth: Payer: Self-pay | Admitting: Internal Medicine

## 2016-02-27 MED ORDER — LANSOPRAZOLE 30 MG PO CPDR: 30.0000 mg | DELAYED_RELEASE_CAPSULE | Freq: Every day | ORAL | 0 refills | Status: DC

## 2016-02-27 MED ORDER — CARVEDILOL 6.25 MG PO TABS
6.2500 mg | ORAL_TABLET | Freq: Two times a day (BID) | ORAL | 0 refills | Status: DC
Start: 2016-02-27 — End: 2016-04-23

## 2016-02-27 MED ORDER — SERTRALINE HCL 50 MG PO TABS: 50.0000 mg | ORAL_TABLET | Freq: Every day | ORAL | 0 refills | Status: DC

## 2016-02-27 NOTE — Telephone Encounter (Signed)
erx done

## 2016-02-27 NOTE — Telephone Encounter (Signed)
Patient is requesting medications to be sent to [facility].

## 2016-02-27 NOTE — Telephone Encounter (Signed)
FYI:  Patient may want to redirect his prescriptions.  Patient will call back in regard.

## 2016-03-02 ENCOUNTER — Other Ambulatory Visit: Payer: Self-pay | Admitting: Internal Medicine

## 2016-03-11 ENCOUNTER — Ambulatory Visit: Payer: 59 | Admitting: Internal Medicine

## 2016-03-11 ENCOUNTER — Telehealth: Payer: Self-pay | Admitting: Internal Medicine

## 2016-03-11 NOTE — Telephone Encounter (Signed)
Patient no showed for medication fu on /.  Please advise.

## 2016-03-11 NOTE — Telephone Encounter (Signed)
Call him 

## 2016-03-14 NOTE — Telephone Encounter (Signed)
Mom is getting patient to call back

## 2016-04-03 ENCOUNTER — Other Ambulatory Visit: Payer: Self-pay | Admitting: *Deleted

## 2016-04-03 DIAGNOSIS — B20 Human immunodeficiency virus [HIV] disease: Secondary | ICD-10-CM

## 2016-04-03 MED ORDER — ABACAVIR-DOLUTEGRAVIR-LAMIVUD 600-50-300 MG PO TABS: 1.0000 | ORAL_TABLET | Freq: Every day | ORAL | 6 refills | Status: DC

## 2016-04-03 NOTE — Telephone Encounter (Signed)
RN asked the pharmacy to send the patient a message.  "Please ask the patient to call [facility] to schedule return lab and MD office visits [phone]). Thank you."

## 2016-04-07 ENCOUNTER — Other Ambulatory Visit: Payer: Self-pay | Admitting: Internal Medicine

## 2016-04-23 ENCOUNTER — Other Ambulatory Visit (INDEPENDENT_AMBULATORY_CARE_PROVIDER_SITE_OTHER): Payer: 59

## 2016-04-23 ENCOUNTER — Ambulatory Visit (INDEPENDENT_AMBULATORY_CARE_PROVIDER_SITE_OTHER): Payer: 59 | Admitting: Internal Medicine

## 2016-04-23 ENCOUNTER — Encounter: Payer: Self-pay | Admitting: Internal Medicine

## 2016-04-23 VITALS — BP 158/100 | HR 83 | Temp 98.5°F | Resp 16 | Ht 70.0 in | Wt 182.2 lb

## 2016-04-23 DIAGNOSIS — B20 Human immunodeficiency virus [HIV] disease: Secondary | ICD-10-CM

## 2016-04-23 DIAGNOSIS — I10 Essential (primary) hypertension: Secondary | ICD-10-CM | POA: Diagnosis not present

## 2016-04-23 DIAGNOSIS — A530 Latent syphilis, unspecified as early or late: Secondary | ICD-10-CM

## 2016-04-23 DIAGNOSIS — Z23 Encounter for immunization: Secondary | ICD-10-CM

## 2016-04-23 DIAGNOSIS — Z0000 Encounter for general adult medical examination without abnormal findings: Secondary | ICD-10-CM | POA: Diagnosis not present

## 2016-04-23 DIAGNOSIS — R739 Hyperglycemia, unspecified: Secondary | ICD-10-CM

## 2016-04-23 DIAGNOSIS — F411 Generalized anxiety disorder: Secondary | ICD-10-CM

## 2016-04-23 DIAGNOSIS — K219 Gastro-esophageal reflux disease without esophagitis: Secondary | ICD-10-CM

## 2016-04-23 DIAGNOSIS — A5601 Chlamydial cystitis and urethritis: Secondary | ICD-10-CM

## 2016-04-23 LAB — COMPREHENSIVE METABOLIC PANEL
ALT: 8 U/L (ref 0–53)
AST: 14 U/L (ref 0–37)
Albumin: 4.7 g/dL (ref 3.5–5.2)
Alkaline Phosphatase: 87 U/L (ref 39–117)
BUN: 11 mg/dL (ref 6–23)
CO2: 28 mEq/L (ref 19–32)
Calcium: 9.7 mg/dL (ref 8.4–10.5)
Chloride: 103 mEq/L (ref 96–112)
Creatinine, Ser: 1.07 mg/dL (ref 0.40–1.50)
GFR: 108.51 mL/min (ref >60.00)
Glucose, Bld: 68 mg/dL — ABNORMAL LOW (ref 70–99)
Potassium: 3.7 mEq/L (ref 3.5–5.1)
Sodium: 139 mEq/L (ref 135–145)
Total Bilirubin: 1.1 mg/dL (ref 0.2–1.2)
Total Protein: 7.5 g/dL (ref 6.0–8.3)

## 2016-04-23 LAB — CBC WITH DIFFERENTIAL/PLATELET
Basophils Absolute: 0 10*3/uL (ref 0.0–0.1)
Basophils Relative: 0.4 % (ref 0.0–3.0)
Eosinophils Absolute: 0.2 10*3/uL (ref 0.0–0.7)
Eosinophils Relative: 1.9 % (ref 0.0–5.0)
HCT: 43.7 % (ref 39.0–52.0)
Hemoglobin: 14.3 g/dL (ref 13.0–17.0)
Lymphocytes Relative: 36.4 % (ref 12.0–46.0)
Lymphs Abs: 3.2 10*3/uL (ref 0.7–4.0)
MCHC: 32.7 g/dL (ref 30.0–36.0)
MCV: 82.7 fl (ref 78.0–100.0)
Monocytes Absolute: 1 10*3/uL (ref 0.1–1.0)
Monocytes Relative: 11.7 % (ref 3.0–12.0)
Neutro Abs: 4.4 10*3/uL (ref 1.4–7.7)
Neutrophils Relative %: 49.6 % (ref 43.0–77.0)
Platelets: 249 10*3/uL (ref 150.0–400.0)
RBC: 5.28 Mil/uL (ref 4.22–5.81)
RDW: 14.9 % (ref 11.5–15.5)
WBC: 8.8 10*3/uL (ref 4.0–10.5)

## 2016-04-23 LAB — LIPID PANEL
Cholesterol: 136 mg/dL (ref 0–200)
HDL: 40.6 mg/dL (ref >39.00)
LDL Cholesterol: 83 mg/dL (ref 0–99)
NonHDL: 95.77
Total CHOL/HDL Ratio: 3
Triglycerides: 63 mg/dL (ref 0.0–149.0)
VLDL: 12.6 mg/dL (ref 0.0–40.0)

## 2016-04-23 LAB — URINALYSIS, ROUTINE W REFLEX MICROSCOPIC
Hgb urine dipstick: NEGATIVE
Nitrite: NEGATIVE
RBC / HPF: NONE SEEN (ref 0–?)
Specific Gravity, Urine: 1.025 (ref 1.000–1.030)
Total Protein, Urine: NEGATIVE
Urine Glucose: NEGATIVE
Urobilinogen, UA: 0.2 (ref 0.0–1.0)
pH: 6 (ref 5.0–8.0)

## 2016-04-23 LAB — HEMOGLOBIN A1C: Hgb A1c MFr Bld: 5.5 % (ref 4.6–6.5)

## 2016-04-23 LAB — TSH: TSH: 0.42 u[IU]/mL (ref 0.35–4.50)

## 2016-04-23 MED ORDER — SERTRALINE HCL 50 MG PO TABS: 50.0000 mg | ORAL_TABLET | Freq: Every day | ORAL | 3 refills | Status: DC

## 2016-04-23 MED ORDER — CARVEDILOL 12.5 MG PO TABS: 12.5000 mg | ORAL_TABLET | Freq: Two times a day (BID) | ORAL | 3 refills | Status: DC

## 2016-04-23 MED ORDER — CHLORTHALIDONE 25 MG PO TABS: 25.0000 mg | ORAL_TABLET | Freq: Every day | ORAL | 3 refills | Status: DC

## 2016-04-23 MED ORDER — CHLORTHALIDONE 25 MG PO TABS: 25.0000 mg | ORAL_TABLET | Freq: Every day | ORAL | 1 refills | Status: DC

## 2016-04-23 MED ORDER — LANSOPRAZOLE 30 MG PO CPDR: 30.0000 mg | DELAYED_RELEASE_CAPSULE | Freq: Every day | ORAL | 3 refills | Status: DC

## 2016-04-23 NOTE — Progress Notes (Signed)
Subjective:  Patient ID: Harold Flores, male    DOB: 08/21/1990  Age: 25 y.o. MRN: [id number]  CC: Annual Exam; Hypertension; Depression; and Gastroesophageal Reflux   HPI Harold Pocheaylor A Beckstrom presents for a CPX.  He complains that his blood pressure is not well controlled and he occasionally has headaches. He reports compliance with carvedilol and denies blurred vision, chest pain, shortness of breath, palpitations, edema, or fatigue.  He is doing well on his meds for HIV but wants to be screened for STDs today. He denies weight loss, sore throat, lymphadenopathy, rash, fever, chills, dysuria, hematuria, penile discharge, or penile lesions.  Outpatient Medications Prior to Visit  Medication Sig Dispense Refill  . abacavir-dolutegravir-lamiVUDine (TRIUMEQ) 600-50-300 MG tablet Take 1 tablet by mouth daily. 30 tablet 6  . valACYclovir (VALTREX) 1000 MG tablet Take 1 tablet (1,000 mg total) by mouth 2 (two) times daily. 20 tablet 2  . carvedilol (COREG) 6.25 MG tablet Take 1 tablet (6.25 mg total) by mouth 2 (two) times daily with a meal. 180 tablet 0  . lansoprazole (PREVACID) 30 MG capsule Take 1 capsule (30 mg total) by mouth daily at 12 noon. 90 capsule 0  . Multiple Vitamins-Minerals (MENS MULTIVITAMIN PLUS) TABS Take by mouth.    . sertraline (ZOLOFT) 50 MG tablet TAKE 1 TABLET BY MOUTH  DAILY. 30 tablet 0   No facility-administered medications prior to visit.     ROS Review of Systems  Constitutional: Negative for activity change, appetite change, chills, fatigue, fever and unexpected weight change.  HENT: Negative.  Negative for facial swelling, sinus pressure, sore throat and trouble swallowing.   Eyes: Negative.  Negative for visual disturbance.  Respiratory: Negative for cough, choking, chest tightness, shortness of breath and stridor.   Cardiovascular: Negative for chest pain, palpitations and leg swelling.  Gastrointestinal: Negative.  Negative for abdominal pain, blood  in stool, constipation, diarrhea, nausea, rectal pain and vomiting.  Endocrine: Negative.   Genitourinary: Negative.  Negative for decreased urine volume, difficulty urinating, discharge, dysuria, flank pain, genital sores, hematuria, penile pain, penile swelling, scrotal swelling, testicular pain and urgency.  Musculoskeletal: Negative.   Skin: Negative.   Allergic/Immunologic: Negative.   Neurological: Positive for headaches. Negative for dizziness, tremors, seizures, syncope, speech difficulty, weakness and light-headedness.  Hematological: Negative.  Negative for adenopathy. Does not bruise/bleed easily.  Psychiatric/Behavioral: Negative.     Objective:  BP (!) 158/100 (BP Location: Left Arm, Patient Position: Sitting, Cuff Size: Normal)   Pulse 83   Temp 98.5 F (36.9 C) (Oral)   Resp 16   Ht 5\' 10"  (1.778 m)   Wt 182 lb 4 oz (82.7 kg)   SpO2 98%   BMI 26.15 kg/m   BP Readings from Last 3 Encounters:  04/23/16 (!) 158/100  08/10/15 (!) 171/117  05/01/15 (!) 174/117    Wt Readings from Last 3 Encounters:  04/23/16 182 lb 4 oz (82.7 kg)  08/10/15 173 lb (78.5 kg)  05/01/15 174 lb (78.9 kg)    Physical Exam  Constitutional: He appears well-developed and well-nourished. No distress.  HENT:  Head: Normocephalic and atraumatic.  Mouth/Throat: Oropharynx is clear and moist. No oropharyngeal exudate.  Eyes: Conjunctivae are normal. Right eye exhibits no discharge. Left eye exhibits no discharge. No scleral icterus.  Neck: Normal range of motion. Neck supple. No JVD present. No tracheal deviation present. No thyromegaly present.  Cardiovascular: Normal rate, regular rhythm, normal heart sounds and intact distal pulses.  Exam reveals no gallop  and no friction rub.   No murmur heard. Pulmonary/Chest: Effort normal and breath sounds normal. No stridor. No respiratory distress. He has no wheezes. He has no rales. He exhibits no tenderness.  Abdominal: Soft. Bowel sounds are  normal. He exhibits no distension and no mass. There is no tenderness. There is no rebound and no guarding. Hernia confirmed negative in the right inguinal area and confirmed negative in the left inguinal area.  Genitourinary: Prostate normal, testes normal and penis normal. Rectal exam shows internal hemorrhoid. Rectal exam shows no external hemorrhoid, no fissure, no mass, no tenderness, anal tone normal and guaiac negative stool. Prostate is not enlarged and not tender. Right testis shows no mass, no swelling and no tenderness. Right testis is descended. Left testis shows no mass, no swelling and no tenderness. Left testis is descended. Circumcised. No penile erythema or penile tenderness. No discharge found.  Lymphadenopathy:    He has no cervical adenopathy.       Right: No inguinal adenopathy present.       Left: No inguinal adenopathy present.  Skin: He is not diaphoretic.  Vitals reviewed.   Lab Results  Component Value Date   WBC 8.8 04/23/2016   HGB 14.3 04/23/2016   HCT 43.7 04/23/2016   PLT 249.0 04/23/2016   GLUCOSE 68 (L) 04/23/2016   CHOL 136 04/23/2016   TRIG 63.0 04/23/2016   HDL 40.60 04/23/2016   LDLCALC 83 04/23/2016   ALT 8 04/23/2016   AST 14 04/23/2016   NA 139 04/23/2016   K 3.7 04/23/2016   CL 103 04/23/2016   CREATININE 1.07 04/23/2016   BUN 11 04/23/2016   CO2 28 04/23/2016   TSH 0.42 04/23/2016   HGBA1C 5.5 04/23/2016    No results found.  Assessment & Plan:   Ladona Ridgelaylor was seen today for annual exam and hypertension.  Diagnoses and all orders for this visit:  Essential hypertension, benign- his blood pressure is not well controlled, I will increase the dose of carvedilol and will add a thiazide diuretic. His labs today are negative for any end organ damage or secondary causes for hypertension. -     Discontinue: chlorthalidone (HYGROTON) 25 MG tablet; Take 1 tablet (25 mg total) by mouth daily. -     chlorthalidone (HYGROTON) 25 MG tablet; Take 1  tablet (25 mg total) by mouth daily. -     carvedilol (COREG) 12.5 MG tablet; Take 1 tablet (12.5 mg total) by mouth 2 (two) times daily with a meal.  Routine general medical examination at a health care facility- exam completed, labs ordered and reviewed, vaccines reviewed and updated, patient education material was given. -     Lipid panel; Future -     Comprehensive metabolic panel; Future -     CBC with Differential/Platelet; Future -     TSH; Future -     Urinalysis, Routine w reflex microscopic (not [facility]); Future  Syphili, latent- his RPR is negative now, this has resolved -     RPR; Future -     GC/Chlamydia Probe Amp; Future  Hyperglycemia- improvement noted, his A1c is down to 5.5%. -     Hemoglobin A1c; Future  Human immunodeficiency virus (HIV) disease (HCC)- he tells me that he is doing well on his ART, I encouraged him to follow-up with infectious disease as directed. His urine is positive for chlamydia today, I have treated this with Zithromax. -     GC/Chlamydia Probe Amp; Future  Gastroesophageal  reflux disease without esophagitis -     lansoprazole (PREVACID) 30 MG capsule; Take 1 capsule (30 mg total) by mouth daily at 12 noon.  GAD (generalized anxiety disorder)- he tells me that the current dose of sertraline is controlling his symptoms. -     sertraline (ZOLOFT) 50 MG tablet; Take 1 tablet (50 mg total) by mouth daily.   I have discontinued Mr. Schicker MENS MULTIVITAMIN PLUS and carvedilol. I have also changed his sertraline. Additionally, I am having him start on carvedilol and azithromycin. Lastly, I am having him maintain his valACYclovir, abacavir-dolutegravir-lamiVUDine, chlorthalidone, and lansoprazole.  Meds ordered this encounter  Medications  . DISCONTD: chlorthalidone (HYGROTON) 25 MG tablet    Sig: Take 1 tablet (25 mg total) by mouth daily.    Dispense:  30 tablet    Refill:  3  . chlorthalidone (HYGROTON) 25 MG tablet    Sig: Take 1 tablet  (25 mg total) by mouth daily.    Dispense:  90 tablet    Refill:  1  . sertraline (ZOLOFT) 50 MG tablet    Sig: Take 1 tablet (50 mg total) by mouth daily.    Dispense:  90 tablet    Refill:  3  . lansoprazole (PREVACID) 30 MG capsule    Sig: Take 1 capsule (30 mg total) by mouth daily at 12 noon.    Dispense:  90 capsule    Refill:  3  . carvedilol (COREG) 12.5 MG tablet    Sig: Take 1 tablet (12.5 mg total) by mouth 2 (two) times daily with a meal.    Dispense:  180 tablet    Refill:  3  . azithromycin (ZITHROMAX) 1 g powder    Sig: Take 2 packets (2 g total) by mouth once.    Dispense:  2 each    Refill:  0     Follow-up: Return in about 6 weeks (around 06/04/2016).  Sanda Linger, MD

## 2016-04-23 NOTE — Patient Instructions (Signed)
Hypertension Hypertension, commonly called high blood pressure, is when the force of blood pumping through your arteries is too strong. Your arteries are the blood vessels that carry blood from your heart throughout your body. A blood pressure reading consists of a higher number over a lower number, such as 110/72. The higher number (systolic) is the pressure inside your arteries when your heart pumps. The lower number (diastolic) is the pressure inside your arteries when your heart relaxes. Ideally you want your blood pressure below 120/80. Hypertension forces your heart to work harder to pump blood. Your arteries may become narrow or stiff. Having untreated or uncontrolled hypertension can cause heart attack, stroke, kidney disease, and other problems. What increases the risk? Some risk factors for high blood pressure are controllable. Others are not. Risk factors you cannot control include:  Race. You may be at higher risk if you are African American.  Age. Risk increases with age.  Gender. Men are at higher risk than women before age 45 years. After age 65, women are at higher risk than men. Risk factors you can control include:  Not getting enough exercise or physical activity.  Being overweight.  Getting too much fat, sugar, calories, or salt in your diet.  Drinking too much alcohol. What are the signs or symptoms? Hypertension does not usually cause signs or symptoms. Extremely high blood pressure (hypertensive crisis) may cause headache, anxiety, shortness of breath, and nosebleed. How is this diagnosed? To check if you have hypertension, your health care provider will measure your blood pressure while you are seated, with your arm held at the level of your heart. It should be measured at least twice using the same arm. Certain conditions can cause a difference in blood pressure between your right and left arms. A blood pressure reading that is higher than normal on one occasion does  not mean that you need treatment. If it is not clear whether you have high blood pressure, you may be asked to return on a different day to have your blood pressure checked again. Or, you may be asked to monitor your blood pressure at home for 1 or more weeks. How is this treated? Treating high blood pressure includes making lifestyle changes and possibly taking medicine. Living a healthy lifestyle can help lower high blood pressure. You may need to change some of your habits. Lifestyle changes may include:  Following the DASH diet. This diet is high in fruits, vegetables, and whole grains. It is low in salt, red meat, and added sugars.  Keep your sodium intake below 2,300 mg per day.  Getting at least 30-45 minutes of aerobic exercise at least 4 times per week.  Losing weight if necessary.  Not smoking.  Limiting alcoholic beverages.  Learning ways to reduce stress. Your health care provider may prescribe medicine if lifestyle changes are not enough to get your blood pressure under control, and if one of the following is true:  You are 18-59 years of age and your systolic blood pressure is above 140.  You are 60 years of age or older, and your systolic blood pressure is above 150.  Your diastolic blood pressure is above 90.  You have diabetes, and your systolic blood pressure is over 140 or your diastolic blood pressure is over 90.  You have kidney disease and your blood pressure is above 140/90.  You have heart disease and your blood pressure is above 140/90. Your personal target blood pressure may vary depending on your medical   conditions, your age, and other factors. Follow these instructions at home:  Have your blood pressure rechecked as directed by your health care provider.  Take medicines only as directed by your health care provider. Follow the directions carefully. Blood pressure medicines must be taken as prescribed. The medicine does not work as well when you skip  doses. Skipping doses also puts you at risk for problems.  Do not smoke.  Monitor your blood pressure at home as directed by your health care provider. Contact a health care provider if:  You think you are having a reaction to medicines taken.  You have recurrent headaches or feel dizzy.  You have swelling in your ankles.  You have trouble with your vision. Get help right away if:  You develop a severe headache or confusion.  You have unusual weakness, numbness, or feel faint.  You have severe chest or abdominal pain.  You vomit repeatedly.  You have trouble breathing. This information is not intended to replace advice given to you by your health care provider. Make sure you discuss any questions you have with your health care provider. Document Released: 05/27/2005 Document Revised: 11/02/2015 Document Reviewed: 03/19/2013 Elsevier Interactive Patient Education  2017 [employer]  

## 2016-04-23 NOTE — Progress Notes (Signed)
Pre visit review using our clinic review tool, if applicable. No additional management support is needed unless otherwise documented below in the visit note. 

## 2016-04-24 ENCOUNTER — Encounter: Payer: Self-pay | Admitting: Internal Medicine

## 2016-04-24 ENCOUNTER — Telehealth: Payer: Self-pay | Admitting: Internal Medicine

## 2016-04-24 DIAGNOSIS — A5601 Chlamydial cystitis and urethritis: Secondary | ICD-10-CM | POA: Insufficient documentation

## 2016-04-24 LAB — RPR

## 2016-04-24 LAB — GC/CHLAMYDIA PROBE AMP
CT Probe RNA: DETECTED — AB
GC Probe RNA: NOT DETECTED

## 2016-04-24 MED ORDER — AZITHROMYCIN 1 G PO PACK: 2.0000 g | PACK | Freq: Once | ORAL | 0 refills | Status: AC

## 2016-04-24 NOTE — Telephone Encounter (Signed)
Patient states Dr. Yetta BarreJones prescribed him an antibiotic and water pills.  States he took both at the same time yesterday and he threw up.  Patient states he took them today but that he believes his mom is allergic to the antibiotic.  Patient is requesting a call in regard.

## 2016-04-25 ENCOUNTER — Other Ambulatory Visit: Payer: Self-pay | Admitting: Internal Medicine

## 2016-04-25 DIAGNOSIS — A5601 Chlamydial cystitis and urethritis: Secondary | ICD-10-CM

## 2016-04-25 MED ORDER — DOXYCYCLINE MONOHYDRATE 100 MG PO CAPS: 100.0000 mg | ORAL_CAPSULE | Freq: Two times a day (BID) | ORAL | 0 refills | Status: AC

## 2016-04-25 NOTE — Telephone Encounter (Signed)
Called pt and he stated he ate and took the abx and hygroton. Shortly after pt vomitted.  1. Was abx effective after pt vomited. 2. Pt mother is also allergic to same abx - would pt be allergic?

## 2016-04-25 NOTE — Telephone Encounter (Signed)
Pt informed of new rx for abx and the intolorance was explained as well. Also informed patient that a much smaller meal instead would be helpful in keeping abx down.

## 2016-04-25 NOTE — Telephone Encounter (Signed)
Antibiotic was not effective if he vomited This is not an allergic reaction but intolerance I have sent an alternative antibiotic to his pharmacy to treat this infection

## 2016-05-09 ENCOUNTER — Other Ambulatory Visit: Payer: Self-pay | Admitting: *Deleted

## 2016-05-09 DIAGNOSIS — B20 Human immunodeficiency virus [HIV] disease: Secondary | ICD-10-CM

## 2016-05-09 MED ORDER — ABACAVIR-DOLUTEGRAVIR-LAMIVUD 600-50-300 MG PO TABS: 1.0000 | ORAL_TABLET | Freq: Every day | ORAL | 6 refills | Status: DC

## 2016-05-10 ENCOUNTER — Telehealth: Payer: Self-pay | Admitting: Infectious Diseases

## 2016-05-10 NOTE — Telephone Encounter (Signed)
Pt called as his insurance lapsed.  He took last triumeq yesterday.  I let him know that the pharmacy staff is out of the clinic after noon on Friday.  I asked that he call back on Monday and speak with the pharmacy in the clinic.  He may need ADAP or pharm assistance program.  Explained to pt and his mom (in background) that his risk of developing resistance is low.

## 2016-05-14 NOTE — Telephone Encounter (Signed)
Spoke to patient and he said he was having trouble getting his medication, but he has now gotten it from the pharmacy. Reminded him of his follow up appointment with Dr. Luciana Axeomer on 06/18/16. Wendall MolaJacqueline Brynn Mulgrew

## 2016-05-29 ENCOUNTER — Ambulatory Visit: Payer: 59 | Admitting: Internal Medicine

## 2016-06-05 ENCOUNTER — Ambulatory Visit: Payer: Self-pay | Admitting: Internal Medicine

## 2016-06-05 ENCOUNTER — Telehealth: Payer: Self-pay | Admitting: *Deleted

## 2016-06-05 DIAGNOSIS — F411 Generalized anxiety disorder: Secondary | ICD-10-CM

## 2016-06-05 MED ORDER — SERTRALINE HCL 50 MG PO TABS: 50.0000 mg | ORAL_TABLET | Freq: Every day | ORAL | 0 refills | Status: DC

## 2016-06-05 NOTE — Telephone Encounter (Signed)
Pt left msg on triage stating he had to reschedule his appt until 06/18/16. He has a week left on his sertraline requesting refill until he come in. Called pt back no answer LMOM will send 2 week supply [facility].../lmb

## 2016-06-18 ENCOUNTER — Encounter: Payer: Self-pay | Admitting: Internal Medicine

## 2016-06-18 ENCOUNTER — Ambulatory Visit (INDEPENDENT_AMBULATORY_CARE_PROVIDER_SITE_OTHER): Payer: BLUE CROSS/BLUE SHIELD | Admitting: Internal Medicine

## 2016-06-18 ENCOUNTER — Other Ambulatory Visit (HOSPITAL_COMMUNITY)
Admission: RE | Admit: 2016-06-18 | Discharge: 2016-06-18 | Disposition: A | Discharge status: Home / Self Care | Payer: BLUE CROSS/BLUE SHIELD | Source: Ambulatory Visit | Attending: Internal Medicine | Admitting: Internal Medicine

## 2016-06-18 VITALS — BP 138/88 | HR 77 | Temp 98.3°F | Resp 16 | Ht 70.0 in | Wt 181.8 lb

## 2016-06-18 VITALS — BP 138/93 | HR 89 | Temp 98.3°F | Wt 182.0 lb

## 2016-06-18 DIAGNOSIS — F411 Generalized anxiety disorder: Secondary | ICD-10-CM

## 2016-06-18 DIAGNOSIS — B20 Human immunodeficiency virus [HIV] disease: Secondary | ICD-10-CM | POA: Diagnosis not present

## 2016-06-18 DIAGNOSIS — Z113 Encounter for screening for infections with a predominantly sexual mode of transmission: Secondary | ICD-10-CM | POA: Diagnosis not present

## 2016-06-18 DIAGNOSIS — I10 Essential (primary) hypertension: Secondary | ICD-10-CM | POA: Diagnosis not present

## 2016-06-18 MED ORDER — SERTRALINE HCL 100 MG PO TABS
100.0000 mg | ORAL_TABLET | Freq: Every day | ORAL | 1 refills | Status: DC
Start: 2016-06-18 — End: 2016-12-17

## 2016-06-18 NOTE — Assessment & Plan Note (Addendum)
Doing well.  Labs today and rtc 6 months unless concerns.  Gets lipid panel, cmp from PCP, last in November.

## 2016-06-18 NOTE — Patient Instructions (Signed)
Hypertension Hypertension, commonly called high blood pressure, is when the force of blood pumping through your arteries is too strong. Your arteries are the blood vessels that carry blood from your heart throughout your body. A blood pressure reading consists of a higher number over a lower number, such as 110/72. The higher number (systolic) is the pressure inside your arteries when your heart pumps. The lower number (diastolic) is the pressure inside your arteries when your heart relaxes. Ideally you want your blood pressure below 120/80. Hypertension forces your heart to work harder to pump blood. Your arteries may become narrow or stiff. Having untreated or uncontrolled hypertension can cause heart attack, stroke, kidney disease, and other problems. What increases the risk? Some risk factors for high blood pressure are controllable. Others are not. Risk factors you cannot control include:  Race. You may be at higher risk if you are African American.  Age. Risk increases with age.  Gender. Men are at higher risk than women before age 45 years. After age 65, women are at higher risk than men. Risk factors you can control include:  Not getting enough exercise or physical activity.  Being overweight.  Getting too much fat, sugar, calories, or salt in your diet.  Drinking too much alcohol. What are the signs or symptoms? Hypertension does not usually cause signs or symptoms. Extremely high blood pressure (hypertensive crisis) may cause headache, anxiety, shortness of breath, and nosebleed. How is this diagnosed? To check if you have hypertension, your health care provider will measure your blood pressure while you are seated, with your arm held at the level of your heart. It should be measured at least twice using the same arm. Certain conditions can cause a difference in blood pressure between your right and left arms. A blood pressure reading that is higher than normal on one occasion does  not mean that you need treatment. If it is not clear whether you have high blood pressure, you may be asked to return on a different day to have your blood pressure checked again. Or, you may be asked to monitor your blood pressure at home for 1 or more weeks. How is this treated? Treating high blood pressure includes making lifestyle changes and possibly taking medicine. Living a healthy lifestyle can help lower high blood pressure. You may need to change some of your habits. Lifestyle changes may include:  Following the DASH diet. This diet is high in fruits, vegetables, and whole grains. It is low in salt, red meat, and added sugars.  Keep your sodium intake below 2,300 mg per day.  Getting at least 30-45 minutes of aerobic exercise at least 4 times per week.  Losing weight if necessary.  Not smoking.  Limiting alcoholic beverages.  Learning ways to reduce stress. Your health care provider may prescribe medicine if lifestyle changes are not enough to get your blood pressure under control, and if one of the following is true:  You are 18-59 years of age and your systolic blood pressure is above 140.  You are 60 years of age or older, and your systolic blood pressure is above 150.  Your diastolic blood pressure is above 90.  You have diabetes, and your systolic blood pressure is over 140 or your diastolic blood pressure is over 90.  You have kidney disease and your blood pressure is above 140/90.  You have heart disease and your blood pressure is above 140/90. Your personal target blood pressure may vary depending on your medical   conditions, your age, and other factors. Follow these instructions at home:  Have your blood pressure rechecked as directed by your health care provider.  Take medicines only as directed by your health care provider. Follow the directions carefully. Blood pressure medicines must be taken as prescribed. The medicine does not work as well when you skip  doses. Skipping doses also puts you at risk for problems.  Do not smoke.  Monitor your blood pressure at home as directed by your health care provider. Contact a health care provider if:  You think you are having a reaction to medicines taken.  You have recurrent headaches or feel dizzy.  You have swelling in your ankles.  You have trouble with your vision. Get help right away if:  You develop a severe headache or confusion.  You have unusual weakness, numbness, or feel faint.  You have severe chest or abdominal pain.  You vomit repeatedly.  You have trouble breathing. This information is not intended to replace advice given to you by your health care provider. Make sure you discuss any questions you have with your health care provider. Document Released: 05/27/2005 Document Revised: 11/02/2015 Document Reviewed: 03/19/2013 Elsevier Interactive Patient Education  2017 [employer]  

## 2016-06-18 NOTE — Progress Notes (Signed)
CC: Follow up for HIV  Interval history: Currently is asymptomatic and well-controlled on Triumeq.  Did miss about 4 days due to insurance issues but now back on and no concerns.   Since last visit no weight loss, no diarrhea.  Was treated for chlamydia.  No current penile discharge, no warts.  Has no associated n/v.  Denies any missed doses other than that described.     Also has recurrent genital herpes but no current issues.    Prior to Admission medications   Medication Sig Start Date End Date Taking? Authorizing Provider  abacavir-dolutegravir-lamiVUDine (TRIUMEQ) 600-50-300 MG tablet Take 1 tablet by mouth daily. 05/09/16  Yes Gardiner Barefootobert W Comer, MD  carvedilol (COREG) 12.5 MG tablet Take 1 tablet (12.5 mg total) by mouth 2 (two) times daily with a meal. 04/23/16  Yes Etta Grandchildhomas L Jones, MD  chlorthalidone (HYGROTON) 25 MG tablet Take 1 tablet (25 mg total) by mouth daily. 04/23/16  Yes Etta Grandchildhomas L Jones, MD  lansoprazole (PREVACID) 30 MG capsule Take 1 capsule (30 mg total) by mouth daily at 12 noon. 04/23/16  Yes Etta Grandchildhomas L Jones, MD  sertraline (ZOLOFT) 50 MG tablet Take 1 tablet (50 mg total) by mouth daily. Must keep 06/18/16 appt for future refills 06/05/16  Yes Etta Grandchildhomas L Jones, MD  valACYclovir (VALTREX) 1000 MG tablet Take 1 tablet (1,000 mg total) by mouth 2 (two) times daily. 02/22/15  Yes Gardiner Barefootobert W Comer, MD    Review of Systems Constitutional: negative for anorexia and weight loss Genitourinary: negative for genital lesions and penile discharge All other systems reviewed and are negative    Physical Exam: CONSTITUTIONAL:in no apparent distress and alert  Vitals:   06/18/16 0930  BP: (!) 138/93  Pulse: 89  Temp: 98.3 F (36.8 C)   Eyes: anicteric HENT: no thrush, no cervical lymphadenopathy Respiratory: Normal respiratory effort; CTA B Cardiovascular: RRR  Lab Results  Component Value Date   HIV1RNAQUANT <20 07/26/2015   HIV1RNAQUANT 115 (H) 03/27/2015   HIV1RNAQUANT 181,055 (H)  02/02/2015   No components found for: HIV1GENOTYPRPLUS No components found for: THELPERCELL

## 2016-06-18 NOTE — Assessment & Plan Note (Signed)
Will check today, recent treatment for chlamydia.

## 2016-06-18 NOTE — Progress Notes (Signed)
Subjective:  Patient ID: Harold Flores, male    DOB: 10/25/1990  Age: 26 y.o. MRN: [id number]  CC: Hypertension   HPI Harold Pocheaylor A Manigo presents for a f/up on HTN. He tells me his blood pressure has been well controlled. He complains of persistent symptoms of anxiety and panic and wants to increase his sertraline dose. He saw infectious disease earlier today.  Outpatient Medications Prior to Visit  Medication Sig Dispense Refill  . abacavir-dolutegravir-lamiVUDine (TRIUMEQ) 600-50-300 MG tablet Take 1 tablet by mouth daily. 30 tablet 6  . carvedilol (COREG) 12.5 MG tablet Take 1 tablet (12.5 mg total) by mouth 2 (two) times daily with a meal. 180 tablet 3  . chlorthalidone (HYGROTON) 25 MG tablet Take 1 tablet (25 mg total) by mouth daily. 90 tablet 1  . lansoprazole (PREVACID) 30 MG capsule Take 1 capsule (30 mg total) by mouth daily at 12 noon. 90 capsule 3  . valACYclovir (VALTREX) 1000 MG tablet Take 1 tablet (1,000 mg total) by mouth 2 (two) times daily. 20 tablet 2  . sertraline (ZOLOFT) 50 MG tablet Take 1 tablet (50 mg total) by mouth daily. Must keep 06/18/16 appt for future refills 14 tablet 0   No facility-administered medications prior to visit.     ROS Review of Systems  Constitutional: Negative.  Negative for appetite change, chills, diaphoresis, fatigue and fever.  HENT: Negative.  Negative for sinus pressure, sore throat and trouble swallowing.   Eyes: Negative for visual disturbance.  Respiratory: Negative for cough, choking, chest tightness, shortness of breath, wheezing and stridor.   Cardiovascular: Negative for chest pain, palpitations and leg swelling.  Endocrine: Negative.  Negative for polyuria.  Genitourinary: Negative.  Negative for decreased urine volume, difficulty urinating, discharge, dysuria, genital sores, scrotal swelling and testicular pain.  Musculoskeletal: Negative.  Negative for back pain and myalgias.  Skin: Negative.  Negative for color  change, pallor and rash.  Allergic/Immunologic: Negative.   Neurological: Negative.  Negative for dizziness, weakness, light-headedness and numbness.  Hematological: Negative for adenopathy. Does not bruise/bleed easily.  Psychiatric/Behavioral: Positive for dysphoric mood. Negative for agitation, behavioral problems, confusion, hallucinations, self-injury, sleep disturbance and suicidal ideas. The patient is nervous/anxious. The patient is not hyperactive.     Objective:  BP 138/88 (BP Location: Left Arm, Patient Position: Sitting, Cuff Size: Normal)   Pulse 77   Temp 98.3 F (36.8 C) (Oral)   Resp 16   Ht 5\' 10"  (1.778 m)   Wt 181 lb 12 oz (82.4 kg)   SpO2 99%   BMI 26.08 kg/m   BP Readings from Last 3 Encounters:  06/18/16 (!) 138/93  04/23/16 (!) 158/100  08/10/15 (!) 171/117    Wt Readings from Last 3 Encounters:  06/18/16 182 lb (82.6 kg)  04/23/16 182 lb 4 oz (82.7 kg)  08/10/15 173 lb (78.5 kg)    Physical Exam  Constitutional: He is oriented to person, place, and time. He appears well-developed and well-nourished. No distress.  HENT:  Head: Normocephalic and atraumatic.  Mouth/Throat: Oropharynx is clear and moist. No oropharyngeal exudate.  Eyes: Conjunctivae are normal. Right eye exhibits no discharge. Left eye exhibits no discharge. No scleral icterus.  Neck: Normal range of motion. Neck supple. No JVD present. No tracheal deviation present. No thyromegaly present.  Cardiovascular: Normal rate, regular rhythm, normal heart sounds and intact distal pulses.  Exam reveals no gallop and no friction rub.   No murmur heard. Pulmonary/Chest: Effort normal and breath sounds normal.  No stridor. No respiratory distress. He has no wheezes. He has no rales. He exhibits no tenderness.  Abdominal: Soft. Bowel sounds are normal. He exhibits no distension and no mass. There is no tenderness. There is no rebound and no guarding.  Musculoskeletal: Normal range of motion. He  exhibits no edema, tenderness or deformity.  Lymphadenopathy:    He has no cervical adenopathy.  Neurological: He is oriented to person, place, and time.  Skin: Skin is warm and dry. No rash noted. He is not diaphoretic. No erythema. No pallor.  Psychiatric: He has a normal mood and affect. His behavior is normal. Judgment and thought content normal.  Vitals reviewed.   Lab Results  Component Value Date   WBC 8.8 04/23/2016   HGB 14.3 04/23/2016   HCT 43.7 04/23/2016   PLT 249.0 04/23/2016   GLUCOSE 68 (L) 04/23/2016   CHOL 136 04/23/2016   TRIG 63.0 04/23/2016   HDL 40.60 04/23/2016   LDLCALC 83 04/23/2016   ALT 8 04/23/2016   AST 14 04/23/2016   NA 139 04/23/2016   K 3.7 04/23/2016   CL 103 04/23/2016   CREATININE 1.07 04/23/2016   BUN 11 04/23/2016   CO2 28 04/23/2016   TSH 0.42 04/23/2016   HGBA1C 5.5 04/23/2016    No results found.  Assessment & Plan:   Longino was seen today for hypertension.  Diagnoses and all orders for this visit:  Essential hypertension, benign- his blood pressures adequately well-controlled, I again asked him to stop smoking cigarettes. I will monitor his electrolytes and renal function today. -     Basic metabolic panel; Future  GAD (generalized anxiety disorder)- will increase his sertraline dose from 50 mg a day to 100 mg a day. Will increase the dose in the next few months as indicated. -     sertraline (ZOLOFT) 100 MG tablet; Take 1 tablet (100 mg total) by mouth daily.   I have discontinued Mr. Tienda sertraline. I am also having him start on sertraline. Additionally, I am having him maintain his valACYclovir, chlorthalidone, lansoprazole, carvedilol, abacavir-dolutegravir-lamiVUDine, and multivitamin.  Meds ordered this encounter  Medications  . Multiple Vitamin (MULTIVITAMIN) tablet    Sig: Take 1 tablet by mouth daily.  . sertraline (ZOLOFT) 100 MG tablet    Sig: Take 1 tablet (100 mg total) by mouth daily.    Dispense:   90 tablet    Refill:  1     Follow-up: Return in about 6 months (around 12/16/2016).  Sanda Linger, MD

## 2016-06-18 NOTE — Progress Notes (Signed)
Pre visit review using our clinic review tool, if applicable. No additional management support is needed unless otherwise documented below in the visit note. 

## 2016-06-19 LAB — T-HELPER CELL (CD4) - (RCID CLINIC ONLY)
CD4 % Helper T Cell: 16 % — ABNORMAL LOW (ref 33–55)
CD4 T Cell Abs: 440 /uL (ref 400–2700)

## 2016-06-19 LAB — URINE CYTOLOGY ANCILLARY ONLY
Chlamydia: NEGATIVE
Neisseria Gonorrhea: NEGATIVE

## 2016-06-19 LAB — RPR

## 2016-06-21 LAB — HIV-1 RNA QUANT-NO REFLEX-BLD
HIV 1 RNA Quant: <20 copies/mL (ref <20)
HIV-1 RNA Quant, Log: <1.3 Log copies/mL (ref <1.30)

## 2016-06-29 ENCOUNTER — Other Ambulatory Visit: Payer: Self-pay | Admitting: Internal Medicine

## 2016-06-29 DIAGNOSIS — B20 Human immunodeficiency virus [HIV] disease: Secondary | ICD-10-CM

## 2016-06-29 MED ORDER — ABACAVIR-DOLUTEGRAVIR-LAMIVUD 600-50-300 MG PO TABS: 1.0000 | ORAL_TABLET | Freq: Every day | ORAL | 6 refills | Status: DC

## 2016-06-30 ENCOUNTER — Other Ambulatory Visit: Payer: Self-pay | Admitting: Internal Medicine

## 2016-08-20 ENCOUNTER — Other Ambulatory Visit: Payer: Self-pay | Admitting: Internal Medicine

## 2016-08-20 DIAGNOSIS — I10 Essential (primary) hypertension: Secondary | ICD-10-CM

## 2016-08-25 ENCOUNTER — Other Ambulatory Visit: Payer: Self-pay | Admitting: Internal Medicine

## 2016-08-25 DIAGNOSIS — I10 Essential (primary) hypertension: Secondary | ICD-10-CM

## 2016-09-04 ENCOUNTER — Other Ambulatory Visit: Payer: Self-pay | Admitting: Internal Medicine

## 2016-10-07 ENCOUNTER — Other Ambulatory Visit: Payer: Self-pay

## 2016-10-07 MED ORDER — LANSOPRAZOLE 30 MG PO CPDR
30.0000 mg | DELAYED_RELEASE_CAPSULE | Freq: Every day | ORAL | 1 refills | Status: DC
Start: 2016-10-07 — End: 2016-10-15

## 2016-10-15 ENCOUNTER — Telehealth: Payer: Self-pay

## 2016-10-15 MED ORDER — LANSOPRAZOLE 30 MG PO CPDR
30.0000 mg | DELAYED_RELEASE_CAPSULE | Freq: Every day | ORAL | 1 refills | Status: DC
Start: 2016-10-15 — End: 2016-10-18

## 2016-10-15 NOTE — Telephone Encounter (Signed)
[facility] mail order rq rf of lansoprazole.   ERX sent on 10/07/2016. Resending erx as requested.

## 2016-10-18 ENCOUNTER — Telehealth: Payer: Self-pay | Admitting: Internal Medicine

## 2016-10-18 MED ORDER — LANSOPRAZOLE 30 MG PO CPDR: 30.0000 mg | DELAYED_RELEASE_CAPSULE | Freq: Every day | ORAL | 1 refills | Status: DC

## 2016-10-18 NOTE — Telephone Encounter (Signed)
Called pharmacy and I had the specialty pharmacy entered.   Corrected the pharmacy and updated that information. Resent erx

## 2016-10-18 NOTE — Telephone Encounter (Signed)
Would like to verify that the Rx for lansoprazole (PREVACID) 30 MG capsule  was sent to the correct pharmacy, he has never used them before.

## 2016-11-14 ENCOUNTER — Encounter: Payer: Self-pay | Admitting: *Deleted

## 2016-11-14 ENCOUNTER — Telehealth: Payer: Self-pay | Admitting: *Deleted

## 2016-11-14 NOTE — Telephone Encounter (Signed)
Pharmacy needs to schedule delivery of RX.  Pharmacy has not been able to contact the patient.  RN gave the pharmacy the patient's home telephone number.  RN sent the patient a MyChart message to contact the pharmacy.

## 2016-12-17 ENCOUNTER — Other Ambulatory Visit: Payer: Self-pay | Admitting: Internal Medicine

## 2016-12-17 DIAGNOSIS — F411 Generalized anxiety disorder: Secondary | ICD-10-CM

## 2016-12-18 ENCOUNTER — Ambulatory Visit: Payer: BLUE CROSS/BLUE SHIELD | Admitting: Internal Medicine

## 2016-12-18 ENCOUNTER — Ambulatory Visit: Payer: Self-pay | Admitting: Internal Medicine

## 2017-01-03 ENCOUNTER — Other Ambulatory Visit: Payer: Self-pay | Admitting: Internal Medicine

## 2017-02-21 ENCOUNTER — Telehealth: Payer: Self-pay | Admitting: Internal Medicine

## 2017-02-21 DIAGNOSIS — I10 Essential (primary) hypertension: Secondary | ICD-10-CM

## 2017-02-21 NOTE — Telephone Encounter (Signed)
Can you call pt at your convenience?  Rx refused until a follow up appt is made.

## 2017-02-21 NOTE — Telephone Encounter (Signed)
Got in touch with patient.  He states he was currently busy and would have to call back to scheduled an appt .

## 2017-04-22 ENCOUNTER — Telehealth: Payer: Self-pay | Admitting: *Deleted

## 2017-04-22 DIAGNOSIS — Z79899 Other long term (current) drug therapy: Secondary | ICD-10-CM

## 2017-04-22 DIAGNOSIS — Z113 Encounter for screening for infections with a predominantly sexual mode of transmission: Secondary | ICD-10-CM

## 2017-04-22 DIAGNOSIS — B20 Human immunodeficiency virus [HIV] disease: Secondary | ICD-10-CM

## 2017-04-22 NOTE — Telephone Encounter (Signed)
Received fax from [facility] stating they could not get in touch with patient to confirm medication delivery. RN called patient, he answered and confirmed identity. RN advised him pharmacy could not reach him for medication delivery - he stated he still had medicine at home. RN asked to schedule an appointment, as he is overdue. Patient will come Thursday for labs, 11/28 with Dr Luciana Axeomer. Lab orders placed.  Andree CossHowell, Seham Gardenhire M, RN

## 2017-04-24 ENCOUNTER — Other Ambulatory Visit: Payer: BLUE CROSS/BLUE SHIELD

## 2017-04-24 ENCOUNTER — Other Ambulatory Visit (HOSPITAL_COMMUNITY)
Admission: RE | Admit: 2017-04-24 | Discharge: 2017-04-24 | Disposition: A | Discharge status: Home / Self Care | Payer: BLUE CROSS/BLUE SHIELD | Source: Ambulatory Visit | Attending: Internal Medicine | Admitting: Internal Medicine

## 2017-04-24 DIAGNOSIS — Z113 Encounter for screening for infections with a predominantly sexual mode of transmission: Secondary | ICD-10-CM | POA: Insufficient documentation

## 2017-04-24 DIAGNOSIS — B20 Human immunodeficiency virus [HIV] disease: Secondary | ICD-10-CM | POA: Insufficient documentation

## 2017-04-24 DIAGNOSIS — Z79899 Other long term (current) drug therapy: Secondary | ICD-10-CM

## 2017-04-25 LAB — CBC WITH DIFFERENTIAL/PLATELET
Basophils Absolute: 40 cells/uL (ref 0–200)
Basophils Relative: 0.4 %
Eosinophils Absolute: 141 cells/uL (ref 15–500)
Eosinophils Relative: 1.4 %
HCT: 38.9 % (ref 38.5–50.0)
Hemoglobin: 13 g/dL — ABNORMAL LOW (ref 13.2–17.1)
Lymphs Abs: 2757 cells/uL (ref 850–3900)
MCH: 27.3 pg (ref 27.0–33.0)
MCHC: 33.4 g/dL (ref 32.0–36.0)
MCV: 81.6 fL (ref 80.0–100.0)
MPV: 9.8 fL (ref 7.5–12.5)
Monocytes Relative: 7.3 %
Neutro Abs: 6424 cells/uL (ref 1500–7800)
Neutrophils Relative %: 63.6 %
Platelets: 349 10*3/uL (ref 140–400)
RBC: 4.77 10*6/uL (ref 4.20–5.80)
RDW: 14.4 % (ref 11.0–15.0)
Total Lymphocyte: 27.3 %
WBC mixed population: 737 cells/uL (ref 200–950)
WBC: 10.1 10*3/uL (ref 3.8–10.8)

## 2017-04-25 LAB — URINE CYTOLOGY ANCILLARY ONLY
Chlamydia: NEGATIVE
Neisseria Gonorrhea: NEGATIVE

## 2017-04-25 LAB — COMPLETE METABOLIC PANEL WITH GFR
AG Ratio: 1.7 (calc) (ref 1.0–2.5)
ALT: 7 U/L — ABNORMAL LOW (ref 9–46)
AST: 12 U/L (ref 10–40)
Albumin: 4 g/dL (ref 3.6–5.1)
Alkaline phosphatase (APISO): 73 U/L (ref 40–115)
BUN/Creatinine Ratio: 4 (calc) — ABNORMAL LOW (ref 6–22)
BUN: 4 mg/dL — ABNORMAL LOW (ref 7–25)
CO2: 31 mmol/L (ref 20–32)
Calcium: 9.1 mg/dL (ref 8.6–10.3)
Chloride: 104 mmol/L (ref 98–110)
Creat: 1 mg/dL (ref 0.60–1.35)
GFR, Est African American: 121 mL/min/{1.73_m2} (ref >=60)
GFR, Est Non African American: 104 mL/min/{1.73_m2} (ref >=60)
Globulin: 2.3 g/dL (calc) (ref 1.9–3.7)
Glucose, Bld: 86 mg/dL (ref 65–99)
Potassium: 4.2 mmol/L (ref 3.5–5.3)
Sodium: 143 mmol/L (ref 135–146)
Total Bilirubin: 0.4 mg/dL (ref 0.2–1.2)
Total Protein: 6.3 g/dL (ref 6.1–8.1)

## 2017-04-25 LAB — RPR TITER: RPR Titer: 1:2 {titer} — ABNORMAL HIGH

## 2017-04-25 LAB — LIPID PANEL
Cholesterol: 128 mg/dL (ref <200)
HDL: 40 mg/dL — ABNORMAL LOW (ref >40)
LDL Cholesterol (Calc): 74 mg/dL (calc)
Non-HDL Cholesterol (Calc): 88 mg/dL (calc) (ref <130)
Total CHOL/HDL Ratio: 3.2 (calc) (ref <5.0)
Triglycerides: 55 mg/dL (ref <150)

## 2017-04-25 LAB — T-HELPER CELL (CD4) - (RCID CLINIC ONLY)
CD4 % Helper T Cell: 18 % — ABNORMAL LOW (ref 33–55)
CD4 T Cell Abs: 550 /uL (ref 400–2700)

## 2017-04-25 LAB — RPR: RPR Ser Ql: REACTIVE — AB

## 2017-04-25 LAB — FLUORESCENT TREPONEMAL AB(FTA)-IGG-BLD: Fluorescent Treponemal ABS: REACTIVE — AB

## 2017-04-28 LAB — HIV-1 RNA QUANT-NO REFLEX-BLD
HIV 1 RNA Quant: 28 copies/mL — ABNORMAL HIGH
HIV-1 RNA Quant, Log: 1.45 Log copies/mL — ABNORMAL HIGH

## 2017-05-07 ENCOUNTER — Ambulatory Visit: Payer: BLUE CROSS/BLUE SHIELD | Admitting: Internal Medicine

## 2017-05-07 ENCOUNTER — Encounter: Payer: Self-pay | Admitting: Internal Medicine

## 2017-05-07 VITALS — BP 154/94 | HR 84 | Temp 98.4°F

## 2017-05-07 DIAGNOSIS — Z23 Encounter for immunization: Secondary | ICD-10-CM | POA: Diagnosis not present

## 2017-05-07 DIAGNOSIS — A530 Latent syphilis, unspecified as early or late: Secondary | ICD-10-CM | POA: Diagnosis not present

## 2017-05-07 DIAGNOSIS — Z7189 Other specified counseling: Secondary | ICD-10-CM | POA: Diagnosis not present

## 2017-05-07 DIAGNOSIS — B20 Human immunodeficiency virus [HIV] disease: Secondary | ICD-10-CM

## 2017-05-07 DIAGNOSIS — Z7185 Encounter for immunization safety counseling: Secondary | ICD-10-CM | POA: Insufficient documentation

## 2017-05-07 NOTE — Assessment & Plan Note (Signed)
Doing well.  Counseled on complete compliance.  rtc 6 months

## 2017-05-07 NOTE — Progress Notes (Signed)
   Subjective:    Patient ID: Harold Flores, male    DOB: 07/03/1990, 26 y.o.   MRN: [id number]  HPI Here for follow up of HIV Taking Triumeq and endorses 2 missed doses in the summer.  No associated n/v/d.  CD4 of 550 and viral load just 28.  LFTs ok.     Review of Systems  Constitutional: Negative for fatigue.  Gastrointestinal: Negative for diarrhea.  Skin: Negative for rash.       Objective:   Physical Exam  Constitutional: He appears well-developed and well-nourished. No distress.  HENT:  Mouth/Throat: No oropharyngeal exudate.  Eyes: No scleral icterus.  Cardiovascular: Normal rate, regular rhythm and normal heart sounds.  No murmur heard. Pulmonary/Chest: Effort normal and breath sounds normal. No respiratory distress.  Lymphadenopathy:    He has no cervical adenopathy.  Skin: No rash noted.    SH: + tobacco, + sexually active with condoms      Assessment & Plan:

## 2017-05-07 NOTE — Assessment & Plan Note (Signed)
Counseled on flu shot and given today 

## 2017-05-07 NOTE — Assessment & Plan Note (Signed)
1:1 and a history of treated syphilis.

## 2017-05-08 NOTE — Progress Notes (Signed)
HPI: Harold Flores is a 26 y.o. male who presents to the [facility] clinic today to follow-up with Dr. Luciana Axeomer for his HIV infection.  Allergies: No Known Allergies  Past Medical History: Past Medical History:  Diagnosis Date  . Anal fissure   . Anal fistula   . Anal ulcer   . Asthma   . Epistaxis   . GAD (generalized anxiety disorder)   . GERD (gastroesophageal reflux disease)   . Hemorrhoid   . HIV (human immunodeficiency virus infection) (HCC)   . Hypertension   . Syphilis   . Varicella     Social History: Social History   Socioeconomic History  . Marital status: Single    Spouse name: None  . Number of children: 0  . Years of education: 3313  . Highest education level: None  Social Needs  . Financial resource strain: None  . Food insecurity - worry: None  . Food insecurity - inability: None  . Transportation needs - medical: None  . Transportation needs - non-medical: None  Occupational History  . Occupation: [employer]  Tobacco Use  . Smoking status: Current Every Day Smoker    Packs/day: 0.50    Years: 5.00    Pack years: 2.50    Types: E-cigarettes, Cigarettes  . Smokeless tobacco: Never Used  Substance and Sexual Activity  . Alcohol use: Yes    Alcohol/week: 0.0 oz    Comment: Rarely  . Drug use: Yes    Types: Marijuana  . Sexual activity: Yes    Partners: Male    Birth control/protection: Condom    Comment: pt. declined condoms  Other Topics Concern  . None  Social History Narrative   Lives at home   A student   architectural games on computer   Fun: Music   Denies religious beliefs that would effect health care.     Current Regimen: Triumeq  Labs: HIV 1 RNA Quant (copies/mL)  Date Value  04/24/2017 28 (H)  06/18/2016 <20  07/26/2015 <20   CD4 T Cell Abs (/uL)  Date Value  04/24/2017 550  06/18/2016 440  07/26/2015 400   Hep B S Ab (no units)  Date Value  02/02/2015 POS (A)   Hepatitis B Surface Ag (no units)  Date Value   02/02/2015 NEGATIVE   HCV Ab (no units)  Date Value  02/02/2015 NEGATIVE    CrCl: CrCl cannot be calculated (Unknown ideal weight.).  Lipids:    Component Value Date/Time   CHOL 128 04/24/2017 1136   TRIG 55 04/24/2017 1136   HDL 40 (L) 04/24/2017 1136   CHOLHDL 3.2 04/24/2017 1136   VLDL 12.6 04/23/2016 1434   LDLCALC 83 04/23/2016 1434    Assessment: Ladona Ridgelaylor is here today to see Dr. Luciana Axeomer for his HIV infection.  I was asked to see him regarding PrEP for his partner(s). He has a partner that is uninsured and he was asking about any programs for PrEP.  I spent time discussing our uninsured PrEP program here at the clinic including when we see patients and the expectations.  I gave him my card and told him or his partner to call me and we can get him on the schedule whenever he is free.  Plans: - Call me to schedule PrEP visit for your partners  Cassie L. Kuppelweiser, PharmD, AAHIVP, CPP Infectious Diseases Clinical Pharmacist [facility] 05/08/2017, 11:02 AM

## 2017-05-24 ENCOUNTER — Other Ambulatory Visit: Payer: Self-pay | Admitting: Internal Medicine

## 2017-05-24 DIAGNOSIS — B20 Human immunodeficiency virus [HIV] disease: Secondary | ICD-10-CM

## 2017-06-18 ENCOUNTER — Encounter: Payer: Self-pay | Admitting: *Deleted

## 2017-06-18 ENCOUNTER — Telehealth: Payer: Self-pay | Admitting: *Deleted

## 2017-06-18 NOTE — Telephone Encounter (Signed)
[facility] unable to reach patient to schedule a rx delivery.  Patient's telephone mailbox is full.  Will MyChart message the patient.

## 2017-07-08 ENCOUNTER — Other Ambulatory Visit: Payer: Self-pay | Admitting: Internal Medicine

## 2017-07-08 ENCOUNTER — Telehealth: Payer: Self-pay | Admitting: Internal Medicine

## 2017-07-08 DIAGNOSIS — F411 Generalized anxiety disorder: Secondary | ICD-10-CM

## 2017-07-08 NOTE — Telephone Encounter (Signed)
Copied from CRM [id number]. Topic: Appointment Scheduling - Scheduling Inquiry for Clinic >> Jul 08, 2017 10:20 AM Cipriano BunkerLambe, Annette S wrote: Reason for CRM:   pt had labs done on 11/18 with Dr Luciana Axeomer and is asking if will need more labs done at CPE appt?   Please call and let pt. Know, incase he needs to fast

## 2017-07-08 NOTE — Telephone Encounter (Signed)
Pt will need to fast for CPE appt.

## 2017-07-08 NOTE — Telephone Encounter (Signed)
LVM to inform patient. To call back if he had any questions.

## 2017-07-15 ENCOUNTER — Ambulatory Visit (INDEPENDENT_AMBULATORY_CARE_PROVIDER_SITE_OTHER): Payer: BLUE CROSS/BLUE SHIELD | Admitting: Internal Medicine

## 2017-07-15 ENCOUNTER — Other Ambulatory Visit: Payer: Self-pay | Admitting: Internal Medicine

## 2017-07-15 ENCOUNTER — Encounter: Payer: Self-pay | Admitting: Internal Medicine

## 2017-07-15 VITALS — BP 154/100 | HR 73 | Temp 98.9°F | Resp 16 | Ht 70.0 in | Wt 190.1 lb

## 2017-07-15 DIAGNOSIS — F411 Generalized anxiety disorder: Secondary | ICD-10-CM

## 2017-07-15 DIAGNOSIS — K219 Gastro-esophageal reflux disease without esophagitis: Secondary | ICD-10-CM | POA: Diagnosis not present

## 2017-07-15 DIAGNOSIS — I10 Essential (primary) hypertension: Secondary | ICD-10-CM | POA: Diagnosis not present

## 2017-07-15 MED ORDER — CARVEDILOL 6.25 MG PO TABS: 6.2500 mg | ORAL_TABLET | Freq: Two times a day (BID) | ORAL | 0 refills | Status: DC

## 2017-07-15 MED ORDER — LANSOPRAZOLE 30 MG PO CPDR: 30.0000 mg | DELAYED_RELEASE_CAPSULE | Freq: Every day | ORAL | 1 refills | Status: DC

## 2017-07-15 MED ORDER — CHLORTHALIDONE 25 MG PO TABS: 25.0000 mg | ORAL_TABLET | Freq: Every day | ORAL | 0 refills | Status: DC

## 2017-07-15 NOTE — Patient Instructions (Signed)

## 2017-07-15 NOTE — Progress Notes (Signed)
Subjective:  Patient ID: Harold Flores, male    DOB: July 07, 1990  Age: 27 y.o. MRN: [id number]  CC: Hypertension   HPI Harold Flores presents for a BP check- His blood pressure has not been well controlled.  He ran out of his antihypertensives a month or 2 ago.  Fortunately, he has had no symptoms related to this such as headache, blurred vision, chest pain, or shortness of breath.  He also tells me his heartburn symptoms are well controlled with the PPI.  He feels well today and offers no complaints.  Outpatient Medications Prior to Visit  Medication Sig Dispense Refill  . Multiple Vitamin (MULTIVITAMIN) tablet Take 1 tablet by mouth daily.    . sertraline (ZOLOFT) 100 MG tablet TAKE 1 TABLET (100 MG TOTAL) BY MOUTH DAILY. 90 tablet 1  . TRIUMEQ 600-50-300 MG tablet TAKE 1 TABLET BY MOUTH DAILY. 30 tablet 5  . valACYclovir (VALTREX) 1000 MG tablet Take 1 tablet (1,000 mg total) by mouth 2 (two) times daily. 20 tablet 2  . carvedilol (COREG) 6.25 MG tablet TAKE 1 TABLET BY MOUTH TWICE A DAY 60 tablet 3  . chlorthalidone (HYGROTON) 25 MG tablet TAKE 1 TABLET (25 MG TOTAL) BY MOUTH DAILY. 30 tablet 3  . lansoprazole (PREVACID) 30 MG capsule Take 1 capsule (30 mg total) by mouth daily at 12 noon. 90 capsule 1   No facility-administered medications prior to visit.     ROS Review of Systems  Constitutional: Negative for diaphoresis, fatigue and unexpected weight change.  HENT: Negative for trouble swallowing and voice change.   Eyes: Negative.  Negative for visual disturbance.  Respiratory: Negative for cough, chest tightness, shortness of breath and wheezing.   Cardiovascular: Negative.  Negative for chest pain, palpitations and leg swelling.  Gastrointestinal: Negative for abdominal pain, constipation, diarrhea, nausea and vomiting.  Endocrine: Negative.   Genitourinary: Negative.  Negative for difficulty urinating.  Musculoskeletal: Negative.  Negative for back pain and neck  pain.  Skin: Negative.  Negative for color change.  Neurological: Negative.  Negative for dizziness and weakness.  Hematological: Negative for adenopathy. Does not bruise/bleed easily.  Psychiatric/Behavioral: Negative.     Objective:  BP (!) 154/100 (BP Location: Right Arm, Patient Position: Sitting, Cuff Size: Normal)   Pulse 73   Temp 98.9 F (37.2 C) (Oral)   Resp 16   Ht 5\' 10"  (1.778 m)   Wt 190 lb 1.3 oz (86.2 kg)   SpO2 98%   BMI 27.27 kg/m   BP Readings from Last 3 Encounters:  07/15/17 (!) 154/100  05/07/17 (!) 154/94  06/18/16 138/88    Wt Readings from Last 3 Encounters:  07/15/17 190 lb 1.3 oz (86.2 kg)  06/18/16 181 lb 12 oz (82.4 kg)  06/18/16 182 lb (82.6 kg)    Physical Exam  Constitutional: He is oriented to person, place, and time. No distress.  HENT:  Mouth/Throat: Oropharynx is clear and moist. No oropharyngeal exudate.  Eyes: Conjunctivae are normal. Left eye exhibits no discharge. No scleral icterus.  Neck: Normal range of motion. Neck supple. No JVD present. No thyromegaly present.  Cardiovascular: Normal rate and normal heart sounds. Exam reveals no gallop.  No murmur heard. Pulmonary/Chest: Effort normal and breath sounds normal. No respiratory distress. He has no wheezes. He has no rales.  Abdominal: Soft. Bowel sounds are normal. He exhibits no distension and no mass. There is no tenderness. There is no guarding.  Musculoskeletal: Normal range of motion. He  exhibits no edema or tenderness.  Lymphadenopathy:    He has no cervical adenopathy.  Neurological: He is alert and oriented to person, place, and time.  Skin: Skin is warm and dry. No rash noted. He is not diaphoretic. No erythema. No pallor.  Vitals reviewed.   Lab Results  Component Value Date   WBC 10.1 04/24/2017   HGB 13.0 (L) 04/24/2017   HCT 38.9 04/24/2017   PLT 349 04/24/2017   GLUCOSE 86 04/24/2017   CHOL 128 04/24/2017   TRIG 55 04/24/2017   HDL 40 (L) 04/24/2017    LDLCALC 83 04/23/2016   ALT 7 (L) 04/24/2017   AST 12 04/24/2017   NA 143 04/24/2017   K 4.2 04/24/2017   CL 104 04/24/2017   CREATININE 1.00 04/24/2017   BUN 4 (L) 04/24/2017   CO2 31 04/24/2017   TSH 0.42 04/23/2016   HGBA1C 5.5 04/23/2016    No results found.  Assessment & Plan:   Harold Flores was seen today for hypertension.  Diagnoses and all orders for this visit:  Essential hypertension, benign- His blood pressure is not well controlled due to noncompliance.  His recent electrolytes and renal function were normal.  Will restart chlorthalidone and carvedilol. -     chlorthalidone (HYGROTON) 25 MG tablet; Take 1 tablet (25 mg total) by mouth daily. -     carvedilol (COREG) 6.25 MG tablet; Take 1 tablet (6.25 mg total) by mouth 2 (two) times daily.  GAD (generalized anxiety disorder)- He is doing well on the current dose of sertraline.  Gastroesophageal reflux disease without esophagitis- He is doing well on the PPI.  Will continue. -     lansoprazole (PREVACID) 30 MG capsule; Take 1 capsule (30 mg total) by mouth daily at 12 noon.   I have changed Harold Flores's carvedilol. I am also having him maintain his valACYclovir, multivitamin, sertraline, TRIUMEQ, chlorthalidone, and lansoprazole.  Meds ordered this encounter  Medications  . chlorthalidone (HYGROTON) 25 MG tablet    Sig: Take 1 tablet (25 mg total) by mouth daily.    Dispense:  90 tablet    Refill:  0  . carvedilol (COREG) 6.25 MG tablet    Sig: Take 1 tablet (6.25 mg total) by mouth 2 (two) times daily.    Dispense:  180 tablet    Refill:  0  . lansoprazole (PREVACID) 30 MG capsule    Sig: Take 1 capsule (30 mg total) by mouth daily at 12 noon.    Dispense:  90 capsule    Refill:  1     Follow-up: Return in about 3 months (around 10/12/2017).  Sanda Lingerhomas Deaysia Grigoryan, MD

## 2017-07-31 ENCOUNTER — Ambulatory Visit (INDEPENDENT_AMBULATORY_CARE_PROVIDER_SITE_OTHER)
Admission: RE | Admit: 2017-07-31 | Discharge: 2017-07-31 | Disposition: A | Discharge status: Home / Self Care | Payer: BLUE CROSS/BLUE SHIELD | Source: Ambulatory Visit | Attending: Internal Medicine | Admitting: Internal Medicine

## 2017-07-31 ENCOUNTER — Encounter: Payer: Self-pay | Admitting: Internal Medicine

## 2017-07-31 ENCOUNTER — Ambulatory Visit (INDEPENDENT_AMBULATORY_CARE_PROVIDER_SITE_OTHER): Payer: BLUE CROSS/BLUE SHIELD | Admitting: Internal Medicine

## 2017-07-31 VITALS — BP 142/82 | HR 81 | Temp 99.4°F | Resp 16 | Ht 70.0 in | Wt 182.0 lb

## 2017-07-31 DIAGNOSIS — R05 Cough: Secondary | ICD-10-CM

## 2017-07-31 DIAGNOSIS — J181 Lobar pneumonia, unspecified organism: Secondary | ICD-10-CM | POA: Diagnosis not present

## 2017-07-31 DIAGNOSIS — R059 Cough, unspecified: Secondary | ICD-10-CM | POA: Insufficient documentation

## 2017-07-31 DIAGNOSIS — J189 Pneumonia, unspecified organism: Secondary | ICD-10-CM

## 2017-07-31 MED ORDER — AMOXICILLIN-POT CLAVULANATE 875-125 MG PO TABS: 1.0000 | ORAL_TABLET | Freq: Two times a day (BID) | ORAL | 0 refills | Status: DC

## 2017-07-31 MED ORDER — HYDROCODONE-HOMATROPINE 5-1.5 MG/5ML PO SYRP: 5.0000 mL | ORAL_SOLUTION | Freq: Three times a day (TID) | ORAL | 0 refills | Status: DC | PRN

## 2017-07-31 NOTE — Progress Notes (Signed)
Subjective:  Patient ID: Harold Flores, male    DOB: 01/31/1991  Age: 27 y.o. MRN: [id number]  CC: Cough   HPI Harold Flores presents for a 6-day history of severe cough productive of green/brown phlegm with low-grade fever and chills.  Outpatient Medications Prior to Visit  Medication Sig Dispense Refill  . carvedilol (COREG) 6.25 MG tablet Take 1 tablet (6.25 mg total) by mouth 2 (two) times daily. 180 tablet 0  . chlorthalidone (HYGROTON) 25 MG tablet Take 1 tablet (25 mg total) by mouth daily. 90 tablet 0  . lansoprazole (PREVACID) 30 MG capsule Take 1 capsule (30 mg total) by mouth daily at 12 noon. 90 capsule 1  . Multiple Vitamin (MULTIVITAMIN) tablet Take 1 tablet by mouth daily.    . sertraline (ZOLOFT) 100 MG tablet TAKE 1 TABLET (100 MG TOTAL) BY MOUTH DAILY. 90 tablet 1  . TRIUMEQ 600-50-300 MG tablet TAKE 1 TABLET BY MOUTH DAILY. 30 tablet 5  . valACYclovir (VALTREX) 1000 MG tablet Take 1 tablet (1,000 mg total) by mouth 2 (two) times daily. 20 tablet 2   No facility-administered medications prior to visit.     ROS Review of Systems  Constitutional: Positive for chills and fever. Negative for appetite change, diaphoresis and fatigue.  HENT: Negative.  Negative for facial swelling, sinus pressure, sore throat, trouble swallowing and voice change.   Eyes: Negative for visual disturbance.  Respiratory: Positive for cough. Negative for chest tightness, shortness of breath and wheezing.   Cardiovascular: Negative for chest pain, palpitations and leg swelling.  Gastrointestinal: Negative.  Negative for abdominal pain, diarrhea, nausea and vomiting.  Endocrine: Negative.   Genitourinary: Negative.  Negative for difficulty urinating and urgency.  Musculoskeletal: Negative.  Negative for back pain and myalgias.  Skin: Negative for color change, pallor and rash.  Allergic/Immunologic: Negative.   Neurological: Negative.  Negative for dizziness, weakness and  light-headedness.  Hematological: Negative for adenopathy. Does not bruise/bleed easily.  Psychiatric/Behavioral: Negative.     Objective:  BP (!) 142/82 (BP Location: Left Arm, Patient Position: Sitting, Cuff Size: Normal)   Pulse 81   Temp 99.4 F (37.4 C) (Oral)   Resp 16   Ht 5\' 10"  (1.778 m)   Wt 182 lb 0.6 oz (82.6 kg)   SpO2 100%   BMI 26.12 kg/m   BP Readings from Last 3 Encounters:  07/31/17 (!) 142/82  07/15/17 (!) 154/100  05/07/17 (!) 154/94    Wt Readings from Last 3 Encounters:  07/31/17 182 lb 0.6 oz (82.6 kg)  07/15/17 190 lb 1.3 oz (86.2 kg)  06/18/16 181 lb 12 oz (82.4 kg)    Physical Exam  Constitutional: He is oriented to person, place, and time. No distress.  HENT:  Mouth/Throat: Oropharynx is clear and moist. No oropharyngeal exudate.  Eyes: Conjunctivae are normal. Left eye exhibits no discharge. No scleral icterus.  Neck: Normal range of motion. Neck supple. No JVD present. No thyromegaly present.  Cardiovascular: Normal rate, regular rhythm and normal heart sounds. Exam reveals no gallop.  No murmur heard. Pulmonary/Chest: Effort normal and breath sounds normal. No respiratory distress. He has no wheezes. He has no rales.  Abdominal: Soft. Bowel sounds are normal. He exhibits no distension and no mass. There is no tenderness. There is no guarding.  Musculoskeletal: Normal range of motion. He exhibits no edema, tenderness or deformity.  Lymphadenopathy:    He has no cervical adenopathy.  Neurological: He is alert and oriented to person,  place, and time.  Skin: Skin is warm and dry. No rash noted. He is not diaphoretic. No erythema. No pallor.  Vitals reviewed.   Lab Results  Component Value Date   WBC 10.1 04/24/2017   HGB 13.0 (L) 04/24/2017   HCT 38.9 04/24/2017   PLT 349 04/24/2017   GLUCOSE 86 04/24/2017   CHOL 128 04/24/2017   TRIG 55 04/24/2017   HDL 40 (L) 04/24/2017   LDLCALC 83 04/23/2016   ALT 7 (L) 04/24/2017   AST 12  04/24/2017   NA 143 04/24/2017   K 4.2 04/24/2017   CL 104 04/24/2017   CREATININE 1.00 04/24/2017   BUN 4 (L) 04/24/2017   CO2 31 04/24/2017   TSH 0.42 04/23/2016   HGBA1C 5.5 04/23/2016    No results found.  Assessment & Plan:   Aashish was seen today for cough.  Diagnoses and all orders for this visit:  Cough- His chest x-ray is negative and on exam he is not having an asthma exacerbation, will treat the cough with Hycodan. -     HYDROcodone-homatropine (HYCODAN) 5-1.5 MG/5ML syrup; Take 5 mLs by mouth every 8 (eight) hours as needed for cough. -     DG Chest 2 View; Future  Pneumonia of both upper lobes due to infectious organism [facility])- I will treat the infection with Augmentin.  Will control the cough with Hycodan. -     HYDROcodone-homatropine (HYCODAN) 5-1.5 MG/5ML syrup; Take 5 mLs by mouth every 8 (eight) hours as needed for cough. -     amoxicillin-clavulanate (AUGMENTIN) 875-125 MG tablet; Take 1 tablet by mouth 2 (two) times daily. -     DG Chest 2 View; Future   I am having Harold Flores start on HYDROcodone-homatropine and amoxicillin-clavulanate. I am also having him maintain his valACYclovir, multivitamin, sertraline, TRIUMEQ, chlorthalidone, carvedilol, and lansoprazole.  Meds ordered this encounter  Medications  . HYDROcodone-homatropine (HYCODAN) 5-1.5 MG/5ML syrup    Sig: Take 5 mLs by mouth every 8 (eight) hours as needed for cough.    Dispense:  120 mL    Refill:  0  . amoxicillin-clavulanate (AUGMENTIN) 875-125 MG tablet    Sig: Take 1 tablet by mouth 2 (two) times daily.    Dispense:  20 tablet    Refill:  0     Follow-up: Return in about 3 weeks (around 08/21/2017).  Sanda Linger, MD

## 2017-07-31 NOTE — Patient Instructions (Signed)
Cough, Adult  Coughing is a reflex that clears your throat and your airways. Coughing helps to heal and protect your lungs. It is normal to cough occasionally, but a cough that happens with other symptoms or lasts a long time may be a sign of a condition that needs treatment. A cough may last only 2-3 weeks (acute), or it may last longer than 8 weeks (chronic).  What are the causes?  Coughing is commonly caused by:   Breathing in substances that irritate your lungs.   A viral or bacterial respiratory infection.   Allergies.   Asthma.   Postnasal drip.   Smoking.   Acid backing up from the stomach into the esophagus (gastroesophageal reflux).   Certain medicines.   Chronic lung problems, including COPD (or rarely, lung cancer).   Other medical conditions such as heart failure.    Follow these instructions at home:  Pay attention to any changes in your symptoms. Take these actions to help with your discomfort:   Take medicines only as told by your health care provider.  ? If you were prescribed an antibiotic medicine, take it as told by your health care provider. Do not stop taking the antibiotic even if you start to feel better.  ? Talk with your health care provider before you take a cough suppressant medicine.   Drink enough fluid to keep your urine clear or pale yellow.   If the air is dry, use a cold steam vaporizer or humidifier in your bedroom or your home to help loosen secretions.   Avoid anything that causes you to cough at work or at home.   If your cough is worse at night, try sleeping in a semi-upright position.   Avoid cigarette smoke. If you smoke, quit smoking. If you need help quitting, ask your health care provider.   Avoid caffeine.   Avoid alcohol.   Rest as needed.    Contact a health care provider if:   You have new symptoms.   You cough up pus.   Your cough does not get better after 2-3 weeks, or your cough gets worse.   You cannot control your cough with suppressant  medicines and you are losing sleep.   You develop pain that is getting worse or pain that is not controlled with pain medicines.   You have a fever.   You have unexplained weight loss.   You have night sweats.  Get help right away if:   You cough up blood.   You have difficulty breathing.   Your heartbeat is very fast.  This information is not intended to replace advice given to you by your health care provider. Make sure you discuss any questions you have with your health care provider.  Document Released: 11/23/2010 Document Revised: 11/02/2015 Document Reviewed: 08/03/2014  Elsevier Interactive Patient Education  2018 [employer]

## 2017-08-04 ENCOUNTER — Other Ambulatory Visit: Payer: Self-pay | Admitting: Internal Medicine

## 2017-08-04 DIAGNOSIS — F411 Generalized anxiety disorder: Secondary | ICD-10-CM

## 2017-09-29 ENCOUNTER — Encounter: Payer: Self-pay | Admitting: *Deleted

## 2017-10-10 ENCOUNTER — Other Ambulatory Visit: Payer: Self-pay | Admitting: Internal Medicine

## 2017-10-10 DIAGNOSIS — I10 Essential (primary) hypertension: Secondary | ICD-10-CM

## 2017-10-13 ENCOUNTER — Ambulatory Visit: Payer: Self-pay | Admitting: Internal Medicine

## 2017-10-13 DIAGNOSIS — Z0289 Encounter for other administrative examinations: Secondary | ICD-10-CM

## 2017-10-21 ENCOUNTER — Other Ambulatory Visit: Payer: Self-pay

## 2017-11-04 ENCOUNTER — Ambulatory Visit: Payer: Self-pay | Admitting: Internal Medicine

## 2018-01-10 ENCOUNTER — Other Ambulatory Visit: Payer: Self-pay | Admitting: Internal Medicine

## 2018-01-10 DIAGNOSIS — K219 Gastro-esophageal reflux disease without esophagitis: Secondary | ICD-10-CM

## 2018-01-11 ENCOUNTER — Other Ambulatory Visit: Payer: Self-pay | Admitting: Internal Medicine

## 2018-01-11 DIAGNOSIS — I10 Essential (primary) hypertension: Secondary | ICD-10-CM

## 2018-02-01 ENCOUNTER — Other Ambulatory Visit: Payer: Self-pay | Admitting: Internal Medicine

## 2018-02-01 DIAGNOSIS — I10 Essential (primary) hypertension: Secondary | ICD-10-CM

## 2018-02-16 ENCOUNTER — Other Ambulatory Visit: Payer: Self-pay | Admitting: Internal Medicine

## 2018-02-16 DIAGNOSIS — F411 Generalized anxiety disorder: Secondary | ICD-10-CM

## 2018-02-25 ENCOUNTER — Other Ambulatory Visit: Payer: Self-pay | Admitting: Internal Medicine

## 2018-02-25 ENCOUNTER — Telehealth: Payer: Self-pay | Admitting: Internal Medicine

## 2018-02-25 DIAGNOSIS — I10 Essential (primary) hypertension: Secondary | ICD-10-CM

## 2018-02-25 MED ORDER — CHLORTHALIDONE 25 MG PO TABS: 25.0000 mg | ORAL_TABLET | Freq: Every day | ORAL | 0 refills | Status: DC

## 2018-02-25 MED ORDER — CARVEDILOL 6.25 MG PO TABS: 6.2500 mg | ORAL_TABLET | Freq: Two times a day (BID) | ORAL | 0 refills | Status: DC

## 2018-02-25 NOTE — Telephone Encounter (Signed)
Copied from CRM [id number]. Topic: Quick Communication - Rx Refill/Question >> Feb 25, 2018  8:28 AM Alexander BergeronBarksdale, Harvey B wrote: Pt has scheduled the med f/u in Oct due to pcp's availability; pt states the medications will run out soon; contact pt if needed  Medication: carvedilol (COREG) 6.25 MG tablet [[id number]][[id number]]  chlorthalidone (HYGROTON) 25 MG tablet [[id number]][[id number]]   Has the patient contacted their pharmacy? Yes.   (Agent: If no, request that the patient contact the pharmacy for the refill.) (Agent: If yes, when and what did the pharmacy advise?)  Preferred Pharmacy (with phone number or street name): [facility] on [address]  Agent: Please be advised that RX refills may take up to 3 business days. We ask that you follow-up with your pharmacy.

## 2018-03-19 ENCOUNTER — Ambulatory Visit: Payer: Self-pay | Admitting: Internal Medicine

## 2018-03-19 DIAGNOSIS — Z0289 Encounter for other administrative examinations: Secondary | ICD-10-CM

## 2018-03-30 ENCOUNTER — Telehealth: Payer: Self-pay

## 2018-03-30 NOTE — Telephone Encounter (Signed)
Patient is overdue for an appointment for lab and to Dr. Luciana Axe. 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 Patient will come into office on 10/23 to see Olegario Messier, Financial assistance and have labs done same day. Patient will follow- up with Dr. Luciana Axe on 11/19. Lorenso Courier, [place]

## 2018-03-31 ENCOUNTER — Other Ambulatory Visit: Payer: Self-pay | Admitting: *Deleted

## 2018-03-31 ENCOUNTER — Other Ambulatory Visit: Payer: Self-pay

## 2018-03-31 DIAGNOSIS — B20 Human immunodeficiency virus [HIV] disease: Secondary | ICD-10-CM

## 2018-03-31 MED ORDER — ABACAVIR-DOLUTEGRAVIR-LAMIVUD 600-50-300 MG PO TABS: 1.0000 | ORAL_TABLET | Freq: Every day | ORAL | 0 refills | Status: DC

## 2018-04-01 ENCOUNTER — Other Ambulatory Visit: Payer: Self-pay

## 2018-04-01 ENCOUNTER — Ambulatory Visit: Payer: Self-pay

## 2018-04-01 DIAGNOSIS — B20 Human immunodeficiency virus [HIV] disease: Secondary | ICD-10-CM

## 2018-04-03 ENCOUNTER — Other Ambulatory Visit: Payer: Self-pay

## 2018-04-03 ENCOUNTER — Telehealth: Payer: Self-pay

## 2018-04-03 NOTE — Telephone Encounter (Signed)
Pharmacist from [facility] called today for clarification on prescription Triumeq. Was able to read back prescription to pharmacist to confirm script. Harold Flores, [place]

## 2018-04-12 ENCOUNTER — Other Ambulatory Visit: Payer: Self-pay | Admitting: Internal Medicine

## 2018-04-12 DIAGNOSIS — I10 Essential (primary) hypertension: Secondary | ICD-10-CM

## 2018-04-13 ENCOUNTER — Telehealth: Payer: Self-pay | Admitting: Internal Medicine

## 2018-04-13 DIAGNOSIS — I10 Essential (primary) hypertension: Secondary | ICD-10-CM

## 2018-04-16 MED ORDER — CHLORTHALIDONE 25 MG PO TABS: 25.0000 mg | ORAL_TABLET | Freq: Every day | ORAL | 0 refills | Status: DC

## 2018-04-16 MED ORDER — CARVEDILOL 6.25 MG PO TABS: 6.2500 mg | ORAL_TABLET | Freq: Two times a day (BID) | ORAL | 0 refills | Status: DC

## 2018-04-16 MED ORDER — CARVEDILOL 6.25 MG PO TABS
6.2500 mg | ORAL_TABLET | Freq: Two times a day (BID) | ORAL | 0 refills | Status: DC
Start: 2018-04-16 — End: 2018-04-29

## 2018-04-16 NOTE — Addendum Note (Signed)
Addended by: Radford Pax M on: 04/16/2018 09:20 AM   Modules accepted: Orders

## 2018-04-16 NOTE — Telephone Encounter (Signed)
Pt informed 30 day supply has been sent.

## 2018-04-16 NOTE — Telephone Encounter (Signed)
Pt called to schedule OV with Dr. Yetta Barre for 04/29/18 8:00am. He is asking if medication can be sent in to get him to his appt. He is needing carvedilol and chlorthalidone.   [facility] [address]

## 2018-04-16 NOTE — Addendum Note (Signed)
Addended by: Verlan Friends on: 04/16/2018 08:33 AM   Modules accepted: Orders

## 2018-04-17 ENCOUNTER — Ambulatory Visit: Payer: BLUE CROSS/BLUE SHIELD

## 2018-04-17 ENCOUNTER — Other Ambulatory Visit (HOSPITAL_COMMUNITY)
Admission: RE | Admit: 2018-04-17 | Discharge: 2018-04-17 | Disposition: A | Discharge status: Home / Self Care | Payer: BLUE CROSS/BLUE SHIELD | Source: Ambulatory Visit | Attending: Internal Medicine | Admitting: Internal Medicine

## 2018-04-17 ENCOUNTER — Other Ambulatory Visit: Payer: Self-pay | Admitting: *Deleted

## 2018-04-17 ENCOUNTER — Ambulatory Visit (INDEPENDENT_AMBULATORY_CARE_PROVIDER_SITE_OTHER): Payer: BLUE CROSS/BLUE SHIELD | Admitting: Pharmacist

## 2018-04-17 ENCOUNTER — Other Ambulatory Visit: Payer: BLUE CROSS/BLUE SHIELD

## 2018-04-17 DIAGNOSIS — Z79899 Other long term (current) drug therapy: Secondary | ICD-10-CM

## 2018-04-17 DIAGNOSIS — Z113 Encounter for screening for infections with a predominantly sexual mode of transmission: Secondary | ICD-10-CM

## 2018-04-17 DIAGNOSIS — B20 Human immunodeficiency virus [HIV] disease: Secondary | ICD-10-CM

## 2018-04-17 LAB — T-HELPER CELL (CD4) - (RCID CLINIC ONLY)
CD4 % Helper T Cell: 18 % — ABNORMAL LOW (ref 33–55)
CD4 T Cell Abs: 440 /uL (ref 400–2700)

## 2018-04-17 MED ORDER — ABACAVIR-DOLUTEGRAVIR-LAMIVUD 600-50-300 MG PO TABS: 1.0000 | ORAL_TABLET | Freq: Every day | ORAL | 1 refills | Status: DC

## 2018-04-17 NOTE — Progress Notes (Signed)
HPI: Harold Flores is a 27 y.o. male presenting for HIV follow up.  Patient Active Problem List   Diagnosis Date Noted  . Cough 07/31/2017  . Pneumonia of both upper lobes due to infectious organism (HCC) 07/31/2017  . Vaccine counseling 05/07/2017  . Screening examination for venereal disease 06/18/2016  . Human immunodeficiency virus (HIV) disease (HCC) 02/22/2015  . Genital herpes in men 01/04/2015  . Gastroesophageal reflux disease without esophagitis 01/04/2015  . Syphili, latent 10/09/2012  . Routine general medical examination at a health care facility 10/08/2012  . Essential hypertension, benign 10/08/2012  . GAD (generalized anxiety disorder) 03/12/2012  . Mild persistent asthma 05/06/2007    Patient's Medications  New Prescriptions   No medications on file  Previous Medications   AMOXICILLIN-CLAVULANATE (AUGMENTIN) 875-125 MG TABLET    Take 1 tablet by mouth 2 (two) times daily.   CARVEDILOL (COREG) 6.25 MG TABLET    Take 1 tablet (6.25 mg total) by mouth 2 (two) times daily.   CHLORTHALIDONE (HYGROTON) 25 MG TABLET    Take 1 tablet (25 mg total) by mouth daily.   HYDROCODONE-HOMATROPINE (HYCODAN) 5-1.5 MG/5ML SYRUP    Take 5 mLs by mouth every 8 (eight) hours as needed for cough.   LANSOPRAZOLE (PREVACID) 30 MG CAPSULE    TAKE 1 CAPSULE (30 MG TOTAL) BY MOUTH DAILY AT 12 NOON.   MULTIPLE VITAMIN (MULTIVITAMIN) TABLET    Take 1 tablet by mouth daily.   SERTRALINE (ZOLOFT) 100 MG TABLET    TAKE 1 TABLET BY MOUTH EVERY DAY   VALACYCLOVIR (VALTREX) 1000 MG TABLET    Take 1 tablet (1,000 mg total) by mouth 2 (two) times daily.  Modified Medications   Modified Medication Previous Medication   ABACAVIR-DOLUTEGRAVIR-LAMIVUDINE (TRIUMEQ) 600-50-300 MG TABLET abacavir-dolutegravir-lamiVUDine (TRIUMEQ) 600-50-300 MG tablet      Take 1 tablet by mouth daily.    Take 1 tablet by mouth daily.  Discontinued Medications   No medications on file    Allergies: No Known  Allergies  Past Medical History: Past Medical History:  Diagnosis Date  . Anal fissure   . Anal fistula   . Anal ulcer   . Asthma   . Epistaxis   . GAD (generalized anxiety disorder)   . GERD (gastroesophageal reflux disease)   . Hemorrhoid   . HIV (human immunodeficiency virus infection) (HCC)   . Hypertension   . Syphilis   . Varicella     Social History: Social History   Socioeconomic History  . Marital status: Single    Spouse name: Not on file  . Number of children: 0  . Years of education: 55  . Highest education level: Not on file  Occupational History  . Occupation: [employer]  Social Needs  . Financial resource strain: Not on file  . Food insecurity:    Worry: Not on file    Inability: Not on file  . Transportation needs:    Medical: Not on file    Non-medical: Not on file  Tobacco Use  . Smoking status: Current Every Day Smoker    Packs/day: 0.50    Years: 5.00    Pack years: 2.50    Types: E-cigarettes, Cigarettes  . Smokeless tobacco: Never Used  Substance and Sexual Activity  . Alcohol use: Yes    Alcohol/week: 0.0 standard drinks    Comment: Rarely  . Drug use: Yes    Types: Marijuana  . Sexual activity: Yes    Partners: Male  Birth control/protection: Condom    Comment: pt. declined condoms  Lifestyle  . Physical activity:    Days per week: Not on file    Minutes per session: Not on file  . Stress: Not on file  Relationships  . Social connections:    Talks on phone: Not on file    Gets together: Not on file    Attends religious service: Not on file    Active member of club or organization: Not on file    Attends meetings of clubs or organizations: Not on file    Relationship status: Not on file  Other Topics Concern  . Not on file  Social History Narrative   Lives at home   A student   architectural games on computer   Fun: Music   Denies religious beliefs that would effect health care.     Labs: Lab Results  Component  Value Date   HIV1RNAQUANT 28 (H) 04/24/2017   HIV1RNAQUANT <20 06/18/2016   HIV1RNAQUANT <20 07/26/2015   CD4TABS 550 04/24/2017   CD4TABS 440 06/18/2016   CD4TABS 400 07/26/2015    RPR and STI Lab Results  Component Value Date   LABRPR REACTIVE (A) 04/24/2017   LABRPR NON REAC 06/18/2016   LABRPR NON REAC 04/23/2016   LABRPR REACTIVE (A) 07/26/2015   LABRPR REACTIVE (A) 02/02/2015   RPRTITER 1:2 (H) 04/24/2017   RPRTITER 1:1 07/26/2015   RPRTITER 1:1 02/02/2015   RPRTITER 1:1 12/21/2014   RPRTITER 1:16 (A) 10/08/2012    STI Results GC GC CT CT  04/24/2017 Negative - Negative -  06/18/2016 Negative - Negative -  04/23/2016 - NOT DETECTED - DETECTED(A)  02/02/2015 Negative - Negative -  03/05/2014 - POSITIVE(A) - NEGATIVE    Hepatitis B Lab Results  Component Value Date   HEPBSAB POS (A) 02/02/2015   HEPBSAG NEGATIVE 02/02/2015   HEPBCAB NON REACTIVE 02/02/2015   Hepatitis C No results found for: HEPCAB, HCVRNAPCRQN Hepatitis A Lab Results  Component Value Date   HAV NON REACTIVE 02/02/2015   Lipids: Lab Results  Component Value Date   CHOL 128 04/24/2017   TRIG 55 04/24/2017   HDL 40 (L) 04/24/2017   CHOLHDL 3.2 04/24/2017   VLDL 16.1 04/23/2016   LDLCALC 74 04/24/2017    Current HIV Regimen: Triumeq  Assessment/Plan: Harold Flores was here today for labs prior to his HIV follow up appointment with Dr. Luciana Axe. He has new insurance and will not require patient assistance. He says that he has not taken his Triumeq for ~1 week now and only has one tablet left. We sent a refill so that he has enough medication to last until his appointment with Dr. Luciana Axe on 04/28/18. Stressed the importance of coming to this appointment. Encouraged to call with any questions or concerns.  Roderic Scarce Zigmund Daniel, PharmD PGY2 Infectious Diseases Pharmacy Resident Phone: [phone] 04/17/2018, 11:05 AM

## 2018-04-20 LAB — RPR TITER: RPR Titer: 1:2 {titer} — ABNORMAL HIGH

## 2018-04-20 LAB — CBC WITH DIFFERENTIAL/PLATELET
Basophils Absolute: 41 cells/uL (ref 0–200)
Basophils Relative: 0.8 %
Eosinophils Absolute: 143 cells/uL (ref 15–500)
Eosinophils Relative: 2.8 %
HCT: 43 % (ref 38.5–50.0)
Hemoglobin: 14.4 g/dL (ref 13.2–17.1)
Lymphs Abs: 2423 cells/uL (ref 850–3900)
MCH: 28.1 pg (ref 27.0–33.0)
MCHC: 33.5 g/dL (ref 32.0–36.0)
MCV: 84 fL (ref 80.0–100.0)
MPV: 10 fL (ref 7.5–12.5)
Monocytes Relative: 12.2 %
Neutro Abs: 1872 cells/uL (ref 1500–7800)
Neutrophils Relative %: 36.7 %
Platelets: 296 10*3/uL (ref 140–400)
RBC: 5.12 10*6/uL (ref 4.20–5.80)
RDW: 14.6 % (ref 11.0–15.0)
Total Lymphocyte: 47.5 %
WBC mixed population: 622 cells/uL (ref 200–950)
WBC: 5.1 10*3/uL (ref 3.8–10.8)

## 2018-04-20 LAB — COMPLETE METABOLIC PANEL WITH GFR
AG Ratio: 1.6 (calc) (ref 1.0–2.5)
ALT: 14 U/L (ref 9–46)
AST: 22 U/L (ref 10–40)
Albumin: 4.3 g/dL (ref 3.6–5.1)
Alkaline phosphatase (APISO): 78 U/L (ref 40–115)
BUN: 7 mg/dL (ref 7–25)
CO2: 30 mmol/L (ref 20–32)
Calcium: 9.3 mg/dL (ref 8.6–10.3)
Chloride: 100 mmol/L (ref 98–110)
Creat: 1.05 mg/dL (ref 0.60–1.35)
GFR, Est African American: 113 mL/min/{1.73_m2} (ref >=60)
GFR, Est Non African American: 97 mL/min/{1.73_m2} (ref >=60)
Globulin: 2.7 g/dL (calc) (ref 1.9–3.7)
Glucose, Bld: 98 mg/dL (ref 65–99)
Potassium: 4.2 mmol/L (ref 3.5–5.3)
Sodium: 138 mmol/L (ref 135–146)
Total Bilirubin: 0.8 mg/dL (ref 0.2–1.2)
Total Protein: 7 g/dL (ref 6.1–8.1)

## 2018-04-20 LAB — URINE CYTOLOGY ANCILLARY ONLY
Chlamydia: NEGATIVE
Neisseria Gonorrhea: NEGATIVE

## 2018-04-20 LAB — HIV-1 RNA QUANT-NO REFLEX-BLD
HIV 1 RNA Quant: 105000 copies/mL — ABNORMAL HIGH
HIV-1 RNA Quant, Log: 5.02 Log copies/mL — ABNORMAL HIGH

## 2018-04-20 LAB — RPR: RPR Ser Ql: REACTIVE — AB

## 2018-04-20 LAB — LIPID PANEL
Cholesterol: 105 mg/dL (ref <200)
HDL: 43 mg/dL (ref >40)
LDL Cholesterol (Calc): 47 mg/dL (calc)
Non-HDL Cholesterol (Calc): 62 mg/dL (calc) (ref <130)
Total CHOL/HDL Ratio: 2.4 (calc) (ref <5.0)
Triglycerides: 69 mg/dL (ref <150)

## 2018-04-20 LAB — FLUORESCENT TREPONEMAL AB(FTA)-IGG-BLD: Fluorescent Treponemal ABS: REACTIVE — AB

## 2018-04-21 ENCOUNTER — Other Ambulatory Visit: Payer: Self-pay | Admitting: Internal Medicine

## 2018-04-21 ENCOUNTER — Ambulatory Visit: Payer: Self-pay | Admitting: Internal Medicine

## 2018-04-21 DIAGNOSIS — K219 Gastro-esophageal reflux disease without esophagitis: Secondary | ICD-10-CM

## 2018-04-22 ENCOUNTER — Telehealth: Payer: Self-pay | Admitting: *Deleted

## 2018-04-22 DIAGNOSIS — B20 Human immunodeficiency virus [HIV] disease: Secondary | ICD-10-CM

## 2018-04-22 NOTE — Telephone Encounter (Signed)
Clydie BraunKaren in lab notified. Order confirmed. Andree CossHowell, Gage Treiber M, RN

## 2018-04-22 NOTE — Telephone Encounter (Signed)
-----   Message from Gardiner Barefootobert W Comer, MD sent at 04/21/2018 12:08 PM EST ----- Please add a regular genotype to his sample. thanks

## 2018-04-28 ENCOUNTER — Ambulatory Visit: Payer: BLUE CROSS/BLUE SHIELD | Admitting: Internal Medicine

## 2018-04-29 ENCOUNTER — Encounter: Payer: Self-pay | Admitting: Internal Medicine

## 2018-04-29 ENCOUNTER — Ambulatory Visit (INDEPENDENT_AMBULATORY_CARE_PROVIDER_SITE_OTHER): Payer: BLUE CROSS/BLUE SHIELD | Admitting: Internal Medicine

## 2018-04-29 ENCOUNTER — Other Ambulatory Visit (INDEPENDENT_AMBULATORY_CARE_PROVIDER_SITE_OTHER): Payer: BLUE CROSS/BLUE SHIELD

## 2018-04-29 VITALS — BP 144/100 | HR 83 | Temp 98.9°F | Resp 16 | Wt 191.4 lb

## 2018-04-29 DIAGNOSIS — I10 Essential (primary) hypertension: Secondary | ICD-10-CM

## 2018-04-29 DIAGNOSIS — Z23 Encounter for immunization: Secondary | ICD-10-CM | POA: Diagnosis not present

## 2018-04-29 DIAGNOSIS — E559 Vitamin D deficiency, unspecified: Secondary | ICD-10-CM | POA: Diagnosis not present

## 2018-04-29 LAB — VITAMIN D 25 HYDROXY (VIT D DEFICIENCY, FRACTURES): VITD: 12.35 ng/mL — ABNORMAL LOW (ref 30.00–100.00)

## 2018-04-29 MED ORDER — CHOLECALCIFEROL 1.25 MG (50000 UT) PO CAPS: 50000.0000 [IU] | ORAL_CAPSULE | ORAL | 1 refills | Status: DC

## 2018-04-29 MED ORDER — CHLORTHALIDONE 25 MG PO TABS: 25.0000 mg | ORAL_TABLET | Freq: Every day | ORAL | 0 refills | Status: DC

## 2018-04-29 MED ORDER — NEBIVOLOL HCL 10 MG PO TABS: 10.0000 mg | ORAL_TABLET | Freq: Every day | ORAL | 0 refills | Status: DC

## 2018-04-29 NOTE — Progress Notes (Signed)
Subjective:  Patient ID: Harold Flores, male    DOB: 04/03/1991  Age: 27 y.o. MRN: [id number]  CC: Hypertension   HPI Harold Pocheaylor A Oleski presents for a BP check.  He complains that he frequently forgets to take the second dose of carvedilol each day.  He says he has been taking the chlorthalidone some recently but has not taken it today.  He does not monitor his blood pressure at home.  He denies any recent episodes of headache, blurred vision, CP, DOE, edema, or fatigue.  Outpatient Medications Prior to Visit  Medication Sig Dispense Refill  . abacavir-dolutegravir-lamiVUDine (TRIUMEQ) 600-50-300 MG tablet Take 1 tablet by mouth daily. 30 tablet 1  . lansoprazole (PREVACID) 30 MG capsule TAKE 1 CAPSULE (30 MG TOTAL) BY MOUTH DAILY AT 12 NOON. 90 capsule 1  . Multiple Vitamin (MULTIVITAMIN) tablet Take 1 tablet by mouth daily.    . sertraline (ZOLOFT) 100 MG tablet TAKE 1 TABLET BY MOUTH EVERY DAY 90 tablet 1  . valACYclovir (VALTREX) 1000 MG tablet Take 1 tablet (1,000 mg total) by mouth 2 (two) times daily. 20 tablet 2  . carvedilol (COREG) 6.25 MG tablet Take 1 tablet (6.25 mg total) by mouth 2 (two) times daily. 60 tablet 0  . chlorthalidone (HYGROTON) 25 MG tablet Take 1 tablet (25 mg total) by mouth daily. 30 tablet 0  . amoxicillin-clavulanate (AUGMENTIN) 875-125 MG tablet Take 1 tablet by mouth 2 (two) times daily. 20 tablet 0  . HYDROcodone-homatropine (HYCODAN) 5-1.5 MG/5ML syrup Take 5 mLs by mouth every 8 (eight) hours as needed for cough. 120 mL 0  . lansoprazole (PREVACID) 30 MG capsule TAKE 1 CAPSULE (30 MG TOTAL) BY MOUTH DAILY AT 12 NOON. 90 capsule 1   No facility-administered medications prior to visit.     ROS Review of Systems  Constitutional: Negative.  Negative for diaphoresis, fatigue and unexpected weight change.  HENT: Negative.   Eyes: Negative for visual disturbance.  Respiratory: Negative for cough, chest tightness, shortness of breath and wheezing.     Cardiovascular: Negative for chest pain, palpitations and leg swelling.  Gastrointestinal: Negative for abdominal pain, constipation, diarrhea, nausea and vomiting.  Genitourinary: Negative.  Negative for difficulty urinating.  Musculoskeletal: Negative.  Negative for arthralgias, myalgias and neck pain.  Skin: Negative.   Neurological: Negative.  Negative for dizziness, weakness, light-headedness and headaches.  Hematological: Negative for adenopathy. Does not bruise/bleed easily.  Psychiatric/Behavioral: Negative.     Objective:  BP (!) 144/100 (BP Location: Left Arm, Patient Position: Sitting, Cuff Size: Normal)   Pulse 83   Temp 98.9 F (37.2 C) (Oral)   Resp 16   Wt 191 lb 6.4 oz (86.8 kg)   SpO2 98%   BMI 27.46 kg/m   BP Readings from Last 3 Encounters:  04/29/18 (!) 144/100  07/31/17 (!) 142/82  07/15/17 (!) 154/100    Wt Readings from Last 3 Encounters:  04/29/18 191 lb 6.4 oz (86.8 kg)  07/31/17 182 lb 0.6 oz (82.6 kg)  07/15/17 190 lb 1.3 oz (86.2 kg)    Physical Exam  Constitutional: He is oriented to person, place, and time. No distress.  HENT:  Mouth/Throat: Oropharynx is clear and moist. No oropharyngeal exudate.  Eyes: Conjunctivae are normal. No scleral icterus.  Neck: Normal range of motion. Neck supple. No JVD present. No thyromegaly present.  Cardiovascular: Normal rate, regular rhythm and normal heart sounds.  No murmur heard. Pulmonary/Chest: Effort normal and breath sounds normal. No respiratory distress.  He has no wheezes. He has no rales.  Abdominal: Soft. Bowel sounds are normal. He exhibits no mass. There is no tenderness.  Musculoskeletal: Normal range of motion. He exhibits no edema.  Lymphadenopathy:    He has no cervical adenopathy.  Neurological: He is alert and oriented to person, place, and time.  Skin: Skin is warm and dry. No rash noted. He is not diaphoretic.  Vitals reviewed.   Lab Results  Component Value Date   WBC 5.1  04/17/2018   HGB 14.4 04/17/2018   HCT 43.0 04/17/2018   PLT 296 04/17/2018   GLUCOSE 98 04/17/2018   CHOL 105 04/17/2018   TRIG 69 04/17/2018   HDL 43 04/17/2018   LDLCALC 47 04/17/2018   ALT 14 04/17/2018   AST 22 04/17/2018   NA 138 04/17/2018   K 4.2 04/17/2018   CL 100 04/17/2018   CREATININE 1.05 04/17/2018   BUN 7 04/17/2018   CO2 30 04/17/2018   TSH 0.42 04/23/2016   HGBA1C 5.5 04/23/2016    No results found.  Assessment & Plan:   Duy was seen today for hypertension.  Diagnoses and all orders for this visit:  Essential hypertension, benign- His blood pressure is not adequately well controlled.  I will treat the vitamin D deficiency.  I will change carvedilol to the once daily nebivolol for better compliance.  I have also asked him to be more compliant with the thiazide diuretic.  He agrees to monitor his blood pressure at home. -     nebivolol (BYSTOLIC) 10 MG tablet; Take 1 tablet (10 mg total) by mouth daily. -     chlorthalidone (HYGROTON) 25 MG tablet; Take 1 tablet (25 mg total) by mouth daily. -     VITAMIN D 25 Hydroxy (Vit-D Deficiency, Fractures); Future  Vitamin D deficiency disease -     Cholecalciferol 1.25 MG (50000 UT) capsule; Take 1 capsule (50,000 Units total) by mouth once a week.  Other orders -     Flu Vaccine QUAD 6+ mos PF IM (Fluarix Quad PF)   I have discontinued Therisa Doyne Mancebo's HYDROcodone-homatropine, amoxicillin-clavulanate, and carvedilol. I am also having him start on nebivolol and Cholecalciferol. Additionally, I am having him maintain his valACYclovir, multivitamin, sertraline, abacavir-dolutegravir-lamiVUDine, lansoprazole, and chlorthalidone.  Meds ordered this encounter  Medications  . nebivolol (BYSTOLIC) 10 MG tablet    Sig: Take 1 tablet (10 mg total) by mouth daily.    Dispense:  63 tablet    Refill:  0  . chlorthalidone (HYGROTON) 25 MG tablet    Sig: Take 1 tablet (25 mg total) by mouth daily.    Dispense:  90  tablet    Refill:  0  . Cholecalciferol 1.25 MG (50000 UT) capsule    Sig: Take 1 capsule (50,000 Units total) by mouth once a week.    Dispense:  12 capsule    Refill:  1     Follow-up: Return in about 2 months (around 06/29/2018).  Sanda Linger, MD

## 2018-04-29 NOTE — Patient Instructions (Signed)

## 2018-04-30 LAB — HIV-1 GENOTYPE: HIV-1 Genotype: DETECTED — AB

## 2018-05-12 ENCOUNTER — Other Ambulatory Visit: Payer: Self-pay | Admitting: Internal Medicine

## 2018-05-12 DIAGNOSIS — I10 Essential (primary) hypertension: Secondary | ICD-10-CM

## 2018-05-12 DIAGNOSIS — F411 Generalized anxiety disorder: Secondary | ICD-10-CM

## 2018-05-22 ENCOUNTER — Other Ambulatory Visit: Payer: Self-pay | Admitting: Internal Medicine

## 2018-05-22 DIAGNOSIS — B20 Human immunodeficiency virus [HIV] disease: Secondary | ICD-10-CM

## 2018-06-08 ENCOUNTER — Telehealth: Payer: Self-pay

## 2018-06-08 ENCOUNTER — Other Ambulatory Visit: Payer: Self-pay | Admitting: Internal Medicine

## 2018-06-08 DIAGNOSIS — B20 Human immunodeficiency virus [HIV] disease: Secondary | ICD-10-CM

## 2018-06-08 NOTE — Telephone Encounter (Signed)
Called patient after receiving refill request for Triumeq. Patient last appointment was  04/17/18 with pharmacy, no showed appointment with Dr. Luciana Axeomer. Left message for patient to call office to schedule an appointment with Dr. Luciana Axeomer and lab work before. Will send in one refill for patient. Lorenso CourierJose L Dezaray [place], [place]

## 2018-08-03 ENCOUNTER — Telehealth: Payer: Self-pay | Admitting: Internal Medicine

## 2018-08-03 ENCOUNTER — Other Ambulatory Visit: Payer: Self-pay | Admitting: Internal Medicine

## 2018-08-03 DIAGNOSIS — I10 Essential (primary) hypertension: Secondary | ICD-10-CM

## 2018-08-03 MED ORDER — NEBIVOLOL HCL 10 MG PO TABS: 10.0000 mg | ORAL_TABLET | Freq: Every day | ORAL | 0 refills | Status: DC

## 2018-08-03 MED ORDER — CHLORTHALIDONE 25 MG PO TABS: 25.0000 mg | ORAL_TABLET | Freq: Every day | ORAL | 0 refills | Status: DC

## 2018-08-03 NOTE — Telephone Encounter (Signed)
Attempted to call patient regarding request to check on symptoms he is having from new medication and request to go back to old Rx. Left message to call back- call forwarded for PCP review of request.

## 2018-08-03 NOTE — Telephone Encounter (Signed)
Called pt and informed that he was to follow up on 06/29/2018.   Pt has been scheduled for this Thursday and erx for some bp meds has been sent in.

## 2018-08-03 NOTE — Telephone Encounter (Signed)
Copied from CRM [id number]. Topic: Quick Communication - Rx Refill/Question >> Aug 03, 2018  8:57 AM Burchel, Abbi R wrote: Medication: nebivolol (BYSTOLIC) 10 MG tablet// carvedilol (COREG) 12.5 MG tablet & chlorthalidone (HYGROTON) 25 MG   Pt states that he tried the Bystolic, but feels that it was causing some fatigue and he would like to go back to taking his previous meds (Carvedilol and Chlorthalidone).  Please advise.    [facility]erred Pharmacy: [facility] [address] [place] - [address] [place] [address] Phone: [phone] Fax: [phone] Not a 24 hour pharmacy; exact hours not known.    Pt was advised that RX refills may take up to 3 business days. We ask that you follow-up with your pharmacy.

## 2018-08-03 NOTE — Telephone Encounter (Signed)
Patient states he was given samples of Bystolic- he states he felt fatigued and tired- he feels it did lower the BP- but he does not like the way it made him feel. Patient states he would rather try to remember to take his 2 pills daily at this point. Patient states he will needs refills on his chlorthalidone and carvedilol. Patient states he can come in for appointment if needed or he can respond to MyChart message if needed.

## 2018-08-04 NOTE — Telephone Encounter (Signed)
Not able to change medications until he is seen in office.

## 2018-08-06 ENCOUNTER — Other Ambulatory Visit (INDEPENDENT_AMBULATORY_CARE_PROVIDER_SITE_OTHER): Payer: BLUE CROSS/BLUE SHIELD

## 2018-08-06 ENCOUNTER — Ambulatory Visit: Payer: BLUE CROSS/BLUE SHIELD | Admitting: Internal Medicine

## 2018-08-06 ENCOUNTER — Encounter: Payer: Self-pay | Admitting: Internal Medicine

## 2018-08-06 VITALS — BP 148/104 | HR 87 | Temp 98.9°F | Resp 16 | Ht 70.0 in | Wt 197.5 lb

## 2018-08-06 DIAGNOSIS — I10 Essential (primary) hypertension: Secondary | ICD-10-CM | POA: Diagnosis not present

## 2018-08-06 DIAGNOSIS — E559 Vitamin D deficiency, unspecified: Secondary | ICD-10-CM | POA: Diagnosis not present

## 2018-08-06 LAB — BASIC METABOLIC PANEL
BUN: 8 mg/dL (ref 6–23)
CO2: 28 mEq/L (ref 19–32)
Calcium: 9.2 mg/dL (ref 8.4–10.5)
Chloride: 104 mEq/L (ref 96–112)
Creatinine, Ser: 0.98 mg/dL (ref 0.40–1.50)
GFR: 110.97 mL/min (ref >60.00)
Glucose, Bld: 85 mg/dL (ref 70–99)
Potassium: 3.6 mEq/L (ref 3.5–5.1)
Sodium: 140 mEq/L (ref 135–145)

## 2018-08-06 LAB — VITAMIN D 25 HYDROXY (VIT D DEFICIENCY, FRACTURES): VITD: 12.09 ng/mL — ABNORMAL LOW (ref 30.00–100.00)

## 2018-08-06 MED ORDER — CARVEDILOL 12.5 MG PO TABS: 12.5000 mg | ORAL_TABLET | Freq: Two times a day (BID) | ORAL | 1 refills | Status: DC

## 2018-08-06 NOTE — Patient Instructions (Signed)

## 2018-08-06 NOTE — Progress Notes (Signed)
Subjective:  Patient ID: Harold Flores, male    DOB: 1991-04-21  Age: 28 y.o. MRN: [id number]  CC: Hypertension   HPI TOLULOPE LUCKY presents for f/up - He has not been taking the vitamin D supplement.  He has not been taking nebivolol because he did not like the way it made him feel.  Instead, he has been taking carvedilol 6.25 mg twice a day.  He has been taking chlorthalidone.  His blood pressures at home have not been adequately well controlled, usually around 140/90.  Outpatient Medications Prior to Visit  Medication Sig Dispense Refill  . chlorthalidone (HYGROTON) 25 MG tablet TAKE 1 TABLET BY MOUTH EVERY DAY 90 tablet 0  . lansoprazole (PREVACID) 30 MG capsule TAKE 1 CAPSULE (30 MG TOTAL) BY MOUTH DAILY AT 12 NOON. 90 capsule 1  . Multiple Vitamin (MULTIVITAMIN) tablet Take 1 tablet by mouth daily.    . sertraline (ZOLOFT) 100 MG tablet TAKE 1 TABLET BY MOUTH EVERY DAY 90 tablet 2  . TRIUMEQ 600-50-300 MG tablet TAKE 1 TABLET BY MOUTH EVERY DAY 30 tablet 0  . valACYclovir (VALTREX) 1000 MG tablet Take 1 tablet (1,000 mg total) by mouth 2 (two) times daily. 20 tablet 2  . Cholecalciferol 1.25 MG (50000 UT) capsule Take 1 capsule (50,000 Units total) by mouth once a week. 12 capsule 1  . nebivolol (BYSTOLIC) 10 MG tablet Take 1 tablet (10 mg total) by mouth daily. 30 tablet 0   No facility-administered medications prior to visit.     ROS Review of Systems  Constitutional: Negative.  Negative for diaphoresis, fatigue and unexpected weight change.  HENT: Negative.   Eyes: Negative for visual disturbance.  Respiratory: Negative for cough, chest tightness, shortness of breath and wheezing.   Cardiovascular: Negative for chest pain, palpitations and leg swelling.  Gastrointestinal: Negative for abdominal pain, constipation, diarrhea, nausea and vomiting.  Genitourinary: Negative.  Negative for difficulty urinating.  Musculoskeletal: Negative.  Negative for arthralgias and  myalgias.  Skin: Negative.  Negative for color change, pallor and rash.  Neurological: Negative for dizziness, weakness and numbness.  Hematological: Negative for adenopathy. Does not bruise/bleed easily.  Psychiatric/Behavioral: Negative.     Objective:  BP (!) 148/104 (BP Location: Left Arm, Patient Position: Sitting, Cuff Size: Normal)   Pulse 87   Temp 98.9 F (37.2 C) (Oral)   Resp 16   Ht 5\' 10"  (1.778 m)   Wt 197 lb 8 oz (89.6 kg)   SpO2 99%   BMI 28.34 kg/m   BP Readings from Last 3 Encounters:  08/06/18 (!) 148/104  04/29/18 (!) 144/100  07/31/17 (!) 142/82    Wt Readings from Last 3 Encounters:  08/06/18 197 lb 8 oz (89.6 kg)  04/29/18 191 lb 6.4 oz (86.8 kg)  07/31/17 182 lb 0.6 oz (82.6 kg)    Physical Exam Vitals signs reviewed.  Constitutional:      Appearance: He is not ill-appearing.  HENT:     Nose: Nose normal. No congestion or rhinorrhea.     Mouth/Throat:     Mouth: Mucous membranes are moist.     Pharynx: Oropharynx is clear. No oropharyngeal exudate or posterior oropharyngeal erythema.  Eyes:     General: No scleral icterus.    Conjunctiva/sclera: Conjunctivae normal.  Neck:     Musculoskeletal: Normal range of motion and neck supple. No muscular tenderness.  Cardiovascular:     Rate and Rhythm: Normal rate and regular rhythm.  Heart sounds: No murmur. No gallop.   Pulmonary:     Effort: Pulmonary effort is normal. No respiratory distress.     Breath sounds: No stridor. No wheezing, rhonchi or rales.  Abdominal:     General: Bowel sounds are normal.     Palpations: There is no hepatomegaly or splenomegaly.     Tenderness: There is no abdominal tenderness. There is no guarding.  Musculoskeletal: Normal range of motion.        General: No swelling.     Right lower leg: No edema.     Left lower leg: No edema.  Skin:    General: Skin is warm and dry.  Neurological:     General: No focal deficit present.     Mental Status: He is  oriented to person, place, and time. Mental status is at baseline.     Lab Results  Component Value Date   WBC 5.1 04/17/2018   HGB 14.4 04/17/2018   HCT 43.0 04/17/2018   PLT 296 04/17/2018   GLUCOSE 85 08/06/2018   CHOL 105 04/17/2018   TRIG 69 04/17/2018   HDL 43 04/17/2018   LDLCALC 47 04/17/2018   ALT 14 04/17/2018   AST 22 04/17/2018   NA 140 08/06/2018   K 3.6 08/06/2018   CL 104 08/06/2018   CREATININE 0.98 08/06/2018   BUN 8 08/06/2018   CO2 28 08/06/2018   TSH 0.42 04/23/2016   HGBA1C 5.5 04/23/2016    No results found.  Assessment & Plan:   Kumari was seen today for hypertension.  Diagnoses and all orders for this visit:  Essential hypertension, benign- His blood pressure is not adequately well controlled.  His electrolytes and renal function are normal.  Will continue the current dose of chlorthalidone but will increase the dose of carvedilol. -     Cancel: Basic metabolic panel; Future -     carvedilol (COREG) 12.5 MG tablet; Take 1 tablet (12.5 mg total) by mouth 2 (two) times daily with a meal. -     Basic metabolic panel; Future  Vitamin D deficiency disease- His vitamin D level remains low.  I have asked him to start taking the vitamin D supplement as directed. -     VITAMIN D 25 Hydroxy (Vit-D Deficiency, Fractures); Future -     Cholecalciferol 1.25 MG (50000 UT) capsule; Take 1 capsule (50,000 Units total) by mouth once a week.   I have discontinued Ladona Ridgel A. Swindle's nebivolol. I am also having him start on carvedilol. Additionally, I am having him maintain his valACYclovir, multivitamin, lansoprazole, sertraline, TRIUMEQ, chlorthalidone, and Cholecalciferol.  Meds ordered this encounter  Medications  . carvedilol (COREG) 12.5 MG tablet    Sig: Take 1 tablet (12.5 mg total) by mouth 2 (two) times daily with a meal.    Dispense:  180 tablet    Refill:  1  . Cholecalciferol 1.25 MG (50000 UT) capsule    Sig: Take 1 capsule (50,000 Units  total) by mouth once a week.    Dispense:  12 capsule    Refill:  0     Follow-up: Return in about 3 months (around 11/04/2018).  Sanda Linger, MD

## 2018-08-07 ENCOUNTER — Encounter: Payer: Self-pay | Admitting: Internal Medicine

## 2018-08-07 MED ORDER — CHOLECALCIFEROL 1.25 MG (50000 UT) PO CAPS: 50000.0000 [IU] | ORAL_CAPSULE | ORAL | 0 refills | Status: DC

## 2018-08-20 ENCOUNTER — Telehealth: Payer: Self-pay

## 2018-08-20 NOTE — Telephone Encounter (Signed)
Copied from CRM [id number]. Topic: General - Other >> Aug 19, 2018  4:05 PM Richarda Blade wrote: Reason for CRM: Patient has spoken with Dr. Yetta Barre about his emotional support dog for his anxiety and needs a letter from Dr. Yetta Barre to get the dog certified. Patient already has the dog and stated that it is already helping with his anxiety and his blood pressure is down. Patient also stated that he is active on My Chart and is ok with the Dr. To put updates on there.

## 2018-08-21 ENCOUNTER — Other Ambulatory Visit: Payer: Self-pay | Admitting: *Deleted

## 2018-08-21 DIAGNOSIS — B20 Human immunodeficiency virus [HIV] disease: Secondary | ICD-10-CM

## 2018-08-21 NOTE — Telephone Encounter (Signed)
Patient states that his finance Elayne Guerin will pick up the letter. Thank you

## 2018-08-25 ENCOUNTER — Other Ambulatory Visit: Payer: Self-pay

## 2018-08-25 ENCOUNTER — Other Ambulatory Visit: Payer: BLUE CROSS/BLUE SHIELD

## 2018-08-25 DIAGNOSIS — B20 Human immunodeficiency virus [HIV] disease: Secondary | ICD-10-CM | POA: Diagnosis not present

## 2018-08-26 LAB — T-HELPER CELL (CD4) - (RCID CLINIC ONLY)
CD4 % Helper T Cell: 13 % — ABNORMAL LOW (ref 33–55)
CD4 T Cell Abs: 420 /uL (ref 400–2700)

## 2018-08-27 LAB — COMPLETE METABOLIC PANEL WITH GFR
AG Ratio: 1.6 (calc) (ref 1.0–2.5)
ALT: 18 U/L (ref 9–46)
AST: 22 U/L (ref 10–40)
Albumin: 4.4 g/dL (ref 3.6–5.1)
Alkaline phosphatase (APISO): 69 U/L (ref 36–130)
BUN: 10 mg/dL (ref 7–25)
CO2: 30 mmol/L (ref 20–32)
Calcium: 10.1 mg/dL (ref 8.6–10.3)
Chloride: 98 mmol/L (ref 98–110)
Creat: 1.06 mg/dL (ref 0.60–1.35)
GFR, Est African American: 111 mL/min/{1.73_m2} (ref >=60)
GFR, Est Non African American: 96 mL/min/{1.73_m2} (ref >=60)
Globulin: 2.7 g/dL (calc) (ref 1.9–3.7)
Glucose, Bld: 94 mg/dL (ref 65–99)
Potassium: 4 mmol/L (ref 3.5–5.3)
Sodium: 139 mmol/L (ref 135–146)
Total Bilirubin: 1.2 mg/dL (ref 0.2–1.2)
Total Protein: 7.1 g/dL (ref 6.1–8.1)

## 2018-08-27 LAB — CBC WITH DIFFERENTIAL/PLATELET
Absolute Monocytes: 767 cells/uL (ref 200–950)
Basophils Absolute: 29 cells/uL (ref 0–200)
Basophils Relative: 0.4 %
Eosinophils Absolute: 139 cells/uL (ref 15–500)
Eosinophils Relative: 1.9 %
HCT: 44.3 % (ref 38.5–50.0)
Hemoglobin: 15.3 g/dL (ref 13.2–17.1)
Lymphs Abs: 3336 cells/uL (ref 850–3900)
MCH: 28.6 pg (ref 27.0–33.0)
MCHC: 34.5 g/dL (ref 32.0–36.0)
MCV: 82.8 fL (ref 80.0–100.0)
MPV: 10.5 fL (ref 7.5–12.5)
Monocytes Relative: 10.5 %
Neutro Abs: 3030 cells/uL (ref 1500–7800)
Neutrophils Relative %: 41.5 %
Platelets: 295 10*3/uL (ref 140–400)
RBC: 5.35 10*6/uL (ref 4.20–5.80)
RDW: 13.5 % (ref 11.0–15.0)
Total Lymphocyte: 45.7 %
WBC: 7.3 10*3/uL (ref 3.8–10.8)

## 2018-08-27 LAB — HIV-1 RNA QUANT-NO REFLEX-BLD
HIV 1 RNA Quant: 141 copies/mL — ABNORMAL HIGH
HIV-1 RNA Quant, Log: 2.15 Log copies/mL — ABNORMAL HIGH

## 2018-09-08 ENCOUNTER — Encounter: Payer: Self-pay | Admitting: Internal Medicine

## 2018-09-08 ENCOUNTER — Ambulatory Visit (INDEPENDENT_AMBULATORY_CARE_PROVIDER_SITE_OTHER): Payer: BLUE CROSS/BLUE SHIELD | Admitting: Internal Medicine

## 2018-09-08 ENCOUNTER — Other Ambulatory Visit: Payer: Self-pay

## 2018-09-08 VITALS — BP 146/100 | Temp 97.7°F | Wt 191.0 lb

## 2018-09-08 DIAGNOSIS — Z5181 Encounter for therapeutic drug level monitoring: Secondary | ICD-10-CM

## 2018-09-08 DIAGNOSIS — F17200 Nicotine dependence, unspecified, uncomplicated: Secondary | ICD-10-CM

## 2018-09-08 DIAGNOSIS — Z113 Encounter for screening for infections with a predominantly sexual mode of transmission: Secondary | ICD-10-CM

## 2018-09-08 DIAGNOSIS — I10 Essential (primary) hypertension: Secondary | ICD-10-CM

## 2018-09-08 DIAGNOSIS — B20 Human immunodeficiency virus [HIV] disease: Secondary | ICD-10-CM | POA: Diagnosis not present

## 2018-09-08 NOTE — Assessment & Plan Note (Signed)
I encouraged him to stop vaping.  Discussed the side effects.

## 2018-09-08 NOTE — Progress Notes (Signed)
   Subjective:    Patient ID: Harold Flores, male    DOB: September 19, 1990, 28 y.o.   MRN: [id number]  HPI Here for follow up of HIV Continues on Triumeq and does endorse some missed doses.  CD4 of 420, viral load of 141 copies. He has had some personal issues.  He continues to vape and his BP has remained up.  No associated headache.  No associated n/v/d.  No weight loss.     Review of Systems  Constitutional: Negative for fatigue and unexpected weight change.  Gastrointestinal: Negative for diarrhea and nausea.  Skin: Negative for rash.       Objective:   Physical Exam Constitutional:      Appearance: Normal appearance.  HENT:     Mouth/Throat:     Pharynx: No posterior oropharyngeal erythema.  Eyes:     General: No scleral icterus. Cardiovascular:     Rate and Rhythm: Normal rate and regular rhythm.     Heart sounds: No murmur.  Pulmonary:     Effort: Pulmonary effort is normal.     Breath sounds: Normal breath sounds.  Skin:    Findings: No rash.  Neurological:     Mental Status: He is alert.   SH: + vape        Assessment & Plan:

## 2018-09-08 NOTE — Assessment & Plan Note (Signed)
I discussed that his BP may be in better control if he stops vaping.  He is stongly considering that.

## 2018-09-08 NOTE — Assessment & Plan Note (Signed)
Good creat, LFTs

## 2018-09-08 NOTE — Assessment & Plan Note (Signed)
He does have some sporadic misses and I emphasized complete compliance.

## 2018-10-14 ENCOUNTER — Other Ambulatory Visit: Payer: Self-pay | Admitting: Internal Medicine

## 2018-10-14 DIAGNOSIS — B20 Human immunodeficiency virus [HIV] disease: Secondary | ICD-10-CM

## 2018-11-11 ENCOUNTER — Other Ambulatory Visit: Payer: Self-pay | Admitting: Internal Medicine

## 2018-11-11 DIAGNOSIS — K219 Gastro-esophageal reflux disease without esophagitis: Secondary | ICD-10-CM

## 2018-11-18 ENCOUNTER — Telehealth: Payer: Self-pay

## 2018-11-18 NOTE — Telephone Encounter (Signed)
Received a call today from [place], Pharmacist at [facility]; stating they have been unable to contact patient to set up delivery. Pharmacy will continue to reach out to patient to schedule delivery. Will send Mychart message requesting patient to contact pharmacy.  [place]

## 2019-01-08 ENCOUNTER — Other Ambulatory Visit: Payer: BLUE CROSS/BLUE SHIELD

## 2019-01-26 ENCOUNTER — Encounter: Payer: BLUE CROSS/BLUE SHIELD | Admitting: Internal Medicine

## 2019-02-17 ENCOUNTER — Ambulatory Visit: Payer: Self-pay

## 2019-02-25 ENCOUNTER — Other Ambulatory Visit: Payer: Self-pay | Admitting: Internal Medicine

## 2019-02-25 DIAGNOSIS — I10 Essential (primary) hypertension: Secondary | ICD-10-CM

## 2019-08-25 ENCOUNTER — Telehealth: Payer: Self-pay | Admitting: *Deleted

## 2019-08-25 NOTE — Telephone Encounter (Signed)
Patient is calling for assistance. He is signing up for [employer], but the plan he is reviewing does not cover Triumeq.  He would like to discuss acceptable alternatives. Will forward to Cassie for assistance. He is also interested in discussing RW coverage with Kathy/Don to help with the cost of [employer]. He is overdue for an appointment, would like to schedule while working with Kathy/Don. Andree Coss, RN

## 2019-08-27 NOTE — Telephone Encounter (Signed)
Spoke with patient concerning signing up for [employer].  Also made him appts for labs and to see his provider.  Insurance will be effective April 1 and he will meet with Roe Coombs for [facility] application at his lab visit.

## 2019-09-09 ENCOUNTER — Encounter: Payer: Self-pay | Admitting: Internal Medicine

## 2019-09-09 ENCOUNTER — Ambulatory Visit (INDEPENDENT_AMBULATORY_CARE_PROVIDER_SITE_OTHER): Payer: 59 | Admitting: Internal Medicine

## 2019-09-09 ENCOUNTER — Other Ambulatory Visit: Payer: Self-pay

## 2019-09-09 VITALS — BP 144/94 | HR 69 | Temp 98.3°F | Resp 16 | Ht 70.0 in | Wt 185.0 lb

## 2019-09-09 DIAGNOSIS — F411 Generalized anxiety disorder: Secondary | ICD-10-CM

## 2019-09-09 DIAGNOSIS — I10 Essential (primary) hypertension: Secondary | ICD-10-CM | POA: Diagnosis not present

## 2019-09-09 DIAGNOSIS — B20 Human immunodeficiency virus [HIV] disease: Secondary | ICD-10-CM

## 2019-09-09 DIAGNOSIS — Z0000 Encounter for general adult medical examination without abnormal findings: Secondary | ICD-10-CM | POA: Diagnosis not present

## 2019-09-09 DIAGNOSIS — Z113 Encounter for screening for infections with a predominantly sexual mode of transmission: Secondary | ICD-10-CM

## 2019-09-09 DIAGNOSIS — Z23 Encounter for immunization: Secondary | ICD-10-CM | POA: Diagnosis not present

## 2019-09-09 DIAGNOSIS — E559 Vitamin D deficiency, unspecified: Secondary | ICD-10-CM

## 2019-09-09 LAB — LIPID PANEL
Cholesterol: 104 mg/dL (ref 0–200)
HDL: 39.6 mg/dL (ref >39.00)
LDL Cholesterol: 43 mg/dL (ref 0–99)
NonHDL: 64.51
Total CHOL/HDL Ratio: 3
Triglycerides: 107 mg/dL (ref 0.0–149.0)
VLDL: 21.4 mg/dL (ref 0.0–40.0)

## 2019-09-09 LAB — URINALYSIS, ROUTINE W REFLEX MICROSCOPIC
Bilirubin Urine: NEGATIVE
Hgb urine dipstick: NEGATIVE
Leukocytes,Ua: NEGATIVE
Nitrite: NEGATIVE
Specific Gravity, Urine: 1.025 (ref 1.000–1.030)
Total Protein, Urine: NEGATIVE
Urine Glucose: NEGATIVE
Urobilinogen, UA: 1 (ref 0.0–1.0)
WBC, UA: NONE SEEN — AB (ref 0–?)
pH: 6.5 (ref 5.0–8.0)

## 2019-09-09 LAB — CBC WITH DIFFERENTIAL/PLATELET
Basophils Absolute: 0 10*3/uL (ref 0.0–0.1)
Basophils Relative: 0.3 % (ref 0.0–3.0)
Eosinophils Absolute: 0.1 10*3/uL (ref 0.0–0.7)
Eosinophils Relative: 2.8 % (ref 0.0–5.0)
HCT: 45 % (ref 39.0–52.0)
Hemoglobin: 15 g/dL (ref 13.0–17.0)
Lymphocytes Relative: 40.8 % (ref 12.0–46.0)
Lymphs Abs: 2 10*3/uL (ref 0.7–4.0)
MCHC: 33.4 g/dL (ref 30.0–36.0)
MCV: 84 fl (ref 78.0–100.0)
Monocytes Absolute: 0.9 10*3/uL (ref 0.1–1.0)
Monocytes Relative: 18.4 % — ABNORMAL HIGH (ref 3.0–12.0)
Neutro Abs: 1.8 10*3/uL (ref 1.4–7.7)
Neutrophils Relative %: 37.7 % — ABNORMAL LOW (ref 43.0–77.0)
Platelets: 243 10*3/uL (ref 150.0–400.0)
RBC: 5.35 Mil/uL (ref 4.22–5.81)
RDW: 14.4 % (ref 11.5–15.5)
WBC: 4.8 10*3/uL (ref 4.0–10.5)

## 2019-09-09 LAB — BASIC METABOLIC PANEL
BUN: 7 mg/dL (ref 6–23)
CO2: 31 mEq/L (ref 19–32)
Calcium: 9.4 mg/dL (ref 8.4–10.5)
Chloride: 101 mEq/L (ref 96–112)
Creatinine, Ser: 0.82 mg/dL (ref 0.40–1.50)
GFR: 135.23 mL/min (ref >60.00)
Glucose, Bld: 98 mg/dL (ref 70–99)
Potassium: 3.7 mEq/L (ref 3.5–5.1)
Sodium: 140 mEq/L (ref 135–145)

## 2019-09-09 LAB — VITAMIN D 25 HYDROXY (VIT D DEFICIENCY, FRACTURES): VITD: 7.92 ng/mL — ABNORMAL LOW (ref 30.00–100.00)

## 2019-09-09 MED ORDER — SERTRALINE HCL 100 MG PO TABS: 100.0000 mg | ORAL_TABLET | Freq: Every day | ORAL | 1 refills | Status: DC

## 2019-09-09 MED ORDER — CHLORTHALIDONE 25 MG PO TABS: 25.0000 mg | ORAL_TABLET | Freq: Every day | ORAL | 0 refills | Status: DC

## 2019-09-09 MED ORDER — CARVEDILOL 12.5 MG PO TABS: ORAL_TABLET | ORAL | 0 refills | Status: DC

## 2019-09-09 MED ORDER — CHOLECALCIFEROL 1.25 MG (50000 UT) PO CAPS: 50000.0000 [IU] | ORAL_CAPSULE | ORAL | 0 refills | Status: DC

## 2019-09-09 NOTE — Patient Instructions (Signed)

## 2019-09-09 NOTE — Progress Notes (Signed)
Subjective:  Patient ID: Harold Flores, male    DOB: 1990/10/12  Age: 29 y.o. MRN: [id number]  CC: Annual Exam and Hypertension  This visit occurred during the SARS-CoV-2 public health emergency.  Safety protocols were in place, including screening questions prior to the visit, additional usage of staff PPE, and extensive cleaning of exam room while observing appropriate contact time as indicated for disinfecting solutions.    HPI Harold Flores presents for a CPX.  His BP has not been well controlled. He thinks he has been taking carvedilol but not the thiazide diuretic.  He denies any recent episodes of headache, blurred vision, chest pain, shortness of breath, palpitations, edema, fatigue.  He continues to struggle with anxiety and anhedonia.  He restarted sertraline about a month or 2 ago and he has started to feel better.  He needs a refill.  He is not currently taking a vitamin D supplement.  He is also not currently taking antiretroviral therapy.  Outpatient Medications Prior to Visit  Medication Sig Dispense Refill  . Multiple Vitamin (MULTIVITAMIN) tablet Take 1 tablet by mouth daily.    . carvedilol (COREG) 12.5 MG tablet TAKE 1 TABLET BY MOUTH 2 TIMES DAILY WITH A MEAL. 60 tablet 5  . lansoprazole (PREVACID) 30 MG capsule TAKE 1 CAPSULE (30 MG TOTAL) BY MOUTH DAILY AT 12 NOON. 30 capsule 5  . sertraline (ZOLOFT) 100 MG tablet TAKE 1 TABLET BY MOUTH EVERY DAY 90 tablet 2  . TRIUMEQ 600-50-300 MG tablet TAKE 1 TABLET BY MOUTH EVERY DAY (Patient not taking: Reported on 09/09/2019) 30 tablet 2  . valACYclovir (VALTREX) 1000 MG tablet Take 1 tablet (1,000 mg total) by mouth 2 (two) times daily. (Patient not taking: Reported on 09/09/2019) 20 tablet 2  . chlorthalidone (HYGROTON) 25 MG tablet TAKE 1 TABLET BY MOUTH EVERY DAY (Patient not taking: Reported on 09/09/2019) 90 tablet 0  . Cholecalciferol 1.25 MG (50000 UT) capsule Take 1 capsule (50,000 Units total) by mouth once a  week. (Patient not taking: Reported on 09/09/2019) 12 capsule 0   No facility-administered medications prior to visit.    ROS Review of Systems  Constitutional: Negative.  Negative for appetite change, chills, diaphoresis, fatigue and fever.  HENT: Negative.   Eyes: Negative for visual disturbance.  Respiratory: Negative for cough, chest tightness, shortness of breath and wheezing.   Cardiovascular: Negative for chest pain, palpitations and leg swelling.  Gastrointestinal: Negative for abdominal pain, constipation, diarrhea, nausea and vomiting.  Endocrine: Negative.   Genitourinary: Negative.  Negative for difficulty urinating, discharge, dysuria, genital sores, penile pain, scrotal swelling, testicular pain and urgency.  Musculoskeletal: Negative for arthralgias and myalgias.  Skin: Negative.  Negative for color change, pallor and rash.  Neurological: Negative.  Negative for dizziness, weakness, light-headedness and headaches.  Hematological: Negative for adenopathy. Does not bruise/bleed easily.  Psychiatric/Behavioral: Negative.     Objective:  BP (!) 144/94 (BP Location: Left Arm, Patient Position: Sitting, Cuff Size: Large)   Pulse 69   Temp 98.3 F (36.8 C) (Oral)   Resp 16   Ht 5\' 10"  (1.778 m)   Wt 185 lb (83.9 kg)   SpO2 98%   BMI 26.54 kg/m   BP Readings from Last 3 Encounters:  09/09/19 (!) 144/94  09/08/18 (!) 146/100  08/06/18 (!) 148/104    Wt Readings from Last 3 Encounters:  09/09/19 185 lb (83.9 kg)  09/08/18 191 lb (86.6 kg)  08/06/18 197 lb 8 oz (89.6 kg)  Physical Exam Vitals reviewed.  Constitutional:      Appearance: Normal appearance.  HENT:     Nose: Nose normal.     Mouth/Throat:     Mouth: Mucous membranes are moist.  Eyes:     General: No scleral icterus.    Conjunctiva/sclera: Conjunctivae normal.  Cardiovascular:     Rate and Rhythm: Normal rate and regular rhythm.     Heart sounds: No murmur.  Pulmonary:     Effort: Pulmonary  effort is normal.     Breath sounds: No stridor. No wheezing, rhonchi or rales.  Abdominal:     General: Abdomen is flat. Bowel sounds are normal. There is no distension.     Palpations: Abdomen is soft. There is no hepatomegaly, splenomegaly or mass.     Tenderness: There is no abdominal tenderness.     Hernia: No hernia is present.  Musculoskeletal:        General: Normal range of motion.     Cervical back: Neck supple.     Right lower leg: No edema.     Left lower leg: No edema.  Lymphadenopathy:     Cervical: No cervical adenopathy.  Skin:    General: Skin is warm and dry.     Coloration: Skin is not pale.     Findings: No rash.  Neurological:     General: No focal deficit present.     Mental Status: He is alert.  Psychiatric:        Mood and Affect: Mood normal.        Behavior: Behavior normal.        Thought Content: Thought content normal.        Judgment: Judgment normal.     Lab Results  Component Value Date   WBC 4.8 09/09/2019   HGB 15.0 09/09/2019   HCT 45.0 09/09/2019   PLT 243.0 09/09/2019   GLUCOSE 98 09/09/2019   CHOL 104 09/09/2019   TRIG 107.0 09/09/2019   HDL 39.60 09/09/2019   LDLCALC 43 09/09/2019   ALT 18 08/25/2018   AST 22 08/25/2018   NA 140 09/09/2019   K 3.7 09/09/2019   CL 101 09/09/2019   CREATININE 0.82 09/09/2019   BUN 7 09/09/2019   CO2 31 09/09/2019   TSH 0.42 04/23/2016   HGBA1C 5.5 04/23/2016    No results found.  Assessment & Plan:   [place] was seen today for annual exam and hypertension.  Diagnoses and all orders for this visit:  Essential hypertension, benign- His BP is not adequately well controlled. Will continue the current dose of carvedilol and will restart the thiazide diuretic. His labs are negative secondary causes or end organ damage. I will treat the Vit D defic. -     Basic metabolic panel; Future -     CBC with Differential/Platelet; Future -     Urinalysis, Routine w reflex microscopic; Future -      chlorthalidone (HYGROTON) 25 MG tablet; Take 1 tablet (25 mg total) by mouth daily. -     carvedilol (COREG) 12.5 MG tablet; TAKE 1 TABLET BY MOUTH 2 TIMES DAILY WITH A MEAL. -     Urinalysis, Routine w reflex microscopic -     CBC with Differential/Platelet -     Basic metabolic panel  Vitamin D deficiency disease -     VITAMIN D 25 Hydroxy (Vit-D Deficiency, Fractures); Future -     VITAMIN D 25 Hydroxy (Vit-D Deficiency, Fractures) -  Cholecalciferol 1.25 MG (50000 UT) capsule; Take 1 capsule (50,000 Units total) by mouth once a week.  Routine general medical examination at a health care facility- Exam completed, labs reviewed, vaccines reviewed, pt ed material was given. -     Lipid panel; Future -     Lipid panel  Human immunodeficiency virus (HIV) disease ([place])- He has an upcoming appt to see ID. -     T-helper cells (CD4) count (not at [facility]); Future -     HIV-1 RNA quant-no reflex-bld; Future -     RPR; Future -     RPR -     HIV-1 RNA quant-no reflex-bld -     T-helper cells (CD4) count (not at [facility])  Screening examination for venereal disease -     RPR; Future -     RPR  GAD (generalized anxiety disorder) -     sertraline (ZOLOFT) 100 MG tablet; Take 1 tablet (100 mg total) by mouth daily.  Need for Tdap vaccination -     Tdap vaccine greater than or equal to 7yo IM   I have discontinued Lovena Le A. Pratt's lansoprazole. I have also changed his chlorthalidone and sertraline. Additionally, I am having him maintain his valACYclovir, multivitamin, Triumeq, carvedilol, and Cholecalciferol.  Meds ordered this encounter  Medications  . chlorthalidone (HYGROTON) 25 MG tablet    Sig: Take 1 tablet (25 mg total) by mouth daily.    Dispense:  90 tablet    Refill:  0  . sertraline (ZOLOFT) 100 MG tablet    Sig: Take 1 tablet (100 mg total) by mouth daily.    Dispense:  90 tablet    Refill:  1  . carvedilol (COREG) 12.5 MG tablet    Sig: TAKE 1 TABLET BY MOUTH 2 TIMES  DAILY WITH A MEAL.    Dispense:  180 tablet    Refill:  0  . Cholecalciferol 1.25 MG (50000 UT) capsule    Sig: Take 1 capsule (50,000 Units total) by mouth once a week.    Dispense:  12 capsule    Refill:  0     Follow-up: Return in about 3 months (around 12/09/2019).  Scarlette Calico, MD

## 2019-09-14 ENCOUNTER — Encounter: Payer: Self-pay | Admitting: Internal Medicine

## 2019-09-14 ENCOUNTER — Ambulatory Visit: Payer: Self-pay | Admitting: Internal Medicine

## 2019-09-14 LAB — HIV-1 RNA QUANT-NO REFLEX-BLD

## 2019-09-14 LAB — RPR TITER: RPR Titer: 1:2 {titer} — ABNORMAL HIGH

## 2019-09-14 LAB — RPR: RPR Ser Ql: REACTIVE — AB

## 2019-09-14 LAB — FLUORESCENT TREPONEMAL AB(FTA)-IGG-BLD: Fluorescent Treponemal ABS: REACTIVE — AB

## 2019-09-20 ENCOUNTER — Ambulatory Visit: Payer: Self-pay

## 2019-09-20 ENCOUNTER — Other Ambulatory Visit: Payer: Self-pay

## 2019-10-04 ENCOUNTER — Ambulatory Visit: Payer: Self-pay | Admitting: Internal Medicine

## 2019-12-03 ENCOUNTER — Telehealth: Payer: Self-pay | Admitting: *Deleted

## 2019-12-03 DIAGNOSIS — B20 Human immunodeficiency virus [HIV] disease: Secondary | ICD-10-CM

## 2019-12-03 DIAGNOSIS — Z113 Encounter for screening for infections with a predominantly sexual mode of transmission: Secondary | ICD-10-CM

## 2019-12-03 DIAGNOSIS — Z79899 Other long term (current) drug therapy: Secondary | ICD-10-CM

## 2019-12-03 NOTE — Telephone Encounter (Signed)
Off medication 3-4 months (triumeq).  He has questions regarding covid shot and his numbers. He has not had labs in more than a year, is not sure what his insurance status is. RN scheduled patient for financial counseling and labs 6/28, will follow up with Dr Ninetta Lights on 7/13 (Dr Luciana Axe unavailable). Labs ordered. Should patient restart Triumeq if he has coverage? Please advise. Andree Coss, RN

## 2019-12-04 ENCOUNTER — Other Ambulatory Visit: Payer: Self-pay | Admitting: Internal Medicine

## 2019-12-04 DIAGNOSIS — E559 Vitamin D deficiency, unspecified: Secondary | ICD-10-CM

## 2019-12-06 ENCOUNTER — Ambulatory Visit: Payer: 59

## 2019-12-06 ENCOUNTER — Other Ambulatory Visit: Payer: 59

## 2019-12-07 ENCOUNTER — Other Ambulatory Visit: Payer: Self-pay | Admitting: *Deleted

## 2019-12-07 DIAGNOSIS — B20 Human immunodeficiency virus [HIV] disease: Secondary | ICD-10-CM

## 2019-12-07 NOTE — Telephone Encounter (Signed)
Placed order for genotype.  He did not come for labs before his appointment.

## 2019-12-07 NOTE — Telephone Encounter (Signed)
I'm glad to see him, thanks Can he have genotype with his pre-visit labs? I would restart triumeq if he is willing, at his earliest convenience.  thanks

## 2019-12-08 NOTE — Telephone Encounter (Signed)
Now worries Thanks!

## 2019-12-09 ENCOUNTER — Ambulatory Visit: Payer: 59 | Admitting: Internal Medicine

## 2019-12-09 ENCOUNTER — Encounter: Payer: Self-pay | Admitting: Internal Medicine

## 2019-12-09 DIAGNOSIS — Z0289 Encounter for other administrative examinations: Secondary | ICD-10-CM

## 2019-12-10 ENCOUNTER — Other Ambulatory Visit: Payer: Self-pay | Admitting: Internal Medicine

## 2019-12-10 DIAGNOSIS — I10 Essential (primary) hypertension: Secondary | ICD-10-CM

## 2019-12-14 ENCOUNTER — Other Ambulatory Visit: Payer: Self-pay | Admitting: Internal Medicine

## 2019-12-14 DIAGNOSIS — K219 Gastro-esophageal reflux disease without esophagitis: Secondary | ICD-10-CM

## 2019-12-21 ENCOUNTER — Ambulatory Visit: Payer: 59 | Admitting: Infectious Diseases

## 2020-02-06 ENCOUNTER — Other Ambulatory Visit: Payer: Self-pay | Admitting: Internal Medicine

## 2020-02-06 DIAGNOSIS — I10 Essential (primary) hypertension: Secondary | ICD-10-CM

## 2020-02-07 ENCOUNTER — Other Ambulatory Visit: Payer: Self-pay | Admitting: Internal Medicine

## 2020-02-07 ENCOUNTER — Telehealth: Payer: Self-pay | Admitting: Internal Medicine

## 2020-02-07 DIAGNOSIS — E559 Vitamin D deficiency, unspecified: Secondary | ICD-10-CM

## 2020-02-07 DIAGNOSIS — F411 Generalized anxiety disorder: Secondary | ICD-10-CM

## 2020-02-07 DIAGNOSIS — I10 Essential (primary) hypertension: Secondary | ICD-10-CM

## 2020-02-07 MED ORDER — VITAMIN D3 1.25 MG (50000 UT) PO CAPS: ORAL_CAPSULE | ORAL | 0 refills | Status: DC

## 2020-02-07 MED ORDER — CHLORTHALIDONE 25 MG PO TABS: 25.0000 mg | ORAL_TABLET | Freq: Every day | ORAL | 0 refills | Status: DC

## 2020-02-07 MED ORDER — SERTRALINE HCL 100 MG PO TABS: 100.0000 mg | ORAL_TABLET | Freq: Every day | ORAL | 1 refills | Status: DC

## 2020-02-07 NOTE — Telephone Encounter (Signed)
    1.Medication Requested: chlorthalidone (HYGROTON) 25 MG tablet Cholecalciferol (VITAMIN D3) 1.25 MG (50000 UT) CAPS sertraline (ZOLOFT) 100 MG tablet   2. Pharmacy (Name, Street, City):[id number] - , [place] - [address]  3. On Med List: yes  4. Last Visit with PCP: 09/09/2019  5. Next visit date with GYF:[id number] states he does not have insurance right now, requesting medications be filled for 30 days until he can obtain insurance   Agent: Please be advised that RX refills may take up to 3 business days. We ask that you follow-up with your pharmacy.

## 2020-02-09 ENCOUNTER — Ambulatory Visit: Payer: Self-pay | Admitting: Psychology

## 2020-04-24 ENCOUNTER — Other Ambulatory Visit: Payer: Self-pay | Admitting: Internal Medicine

## 2020-04-24 DIAGNOSIS — I10 Essential (primary) hypertension: Secondary | ICD-10-CM

## 2020-06-18 ENCOUNTER — Other Ambulatory Visit: Payer: Self-pay | Admitting: Internal Medicine

## 2020-06-18 DIAGNOSIS — I10 Essential (primary) hypertension: Secondary | ICD-10-CM

## 2020-07-20 ENCOUNTER — Telehealth: Payer: Self-pay

## 2020-07-20 ENCOUNTER — Other Ambulatory Visit: Payer: Self-pay

## 2020-07-20 ENCOUNTER — Ambulatory Visit (INDEPENDENT_AMBULATORY_CARE_PROVIDER_SITE_OTHER): Payer: Medicaid Other | Admitting: Internal Medicine

## 2020-07-20 ENCOUNTER — Encounter: Payer: Self-pay | Admitting: Internal Medicine

## 2020-07-20 ENCOUNTER — Other Ambulatory Visit: Payer: Self-pay | Admitting: Internal Medicine

## 2020-07-20 VITALS — BP 142/78 | HR 125 | Temp 98.5°F | Ht 70.0 in

## 2020-07-20 DIAGNOSIS — R202 Paresthesia of skin: Secondary | ICD-10-CM

## 2020-07-20 DIAGNOSIS — B20 Human immunodeficiency virus [HIV] disease: Secondary | ICD-10-CM

## 2020-07-20 DIAGNOSIS — F17200 Nicotine dependence, unspecified, uncomplicated: Secondary | ICD-10-CM

## 2020-07-20 DIAGNOSIS — R000 Tachycardia, unspecified: Secondary | ICD-10-CM | POA: Diagnosis not present

## 2020-07-20 DIAGNOSIS — I10 Essential (primary) hypertension: Secondary | ICD-10-CM

## 2020-07-20 DIAGNOSIS — R269 Unspecified abnormalities of gait and mobility: Secondary | ICD-10-CM

## 2020-07-20 DIAGNOSIS — R600 Localized edema: Secondary | ICD-10-CM

## 2020-07-20 DIAGNOSIS — R609 Edema, unspecified: Secondary | ICD-10-CM

## 2020-07-20 DIAGNOSIS — E559 Vitamin D deficiency, unspecified: Secondary | ICD-10-CM

## 2020-07-20 DIAGNOSIS — R9431 Abnormal electrocardiogram [ECG] [EKG]: Secondary | ICD-10-CM

## 2020-07-20 MED ORDER — CARVEDILOL 12.5 MG PO TABS
ORAL_TABLET | ORAL | 0 refills | Status: DC
Start: 2020-07-20 — End: 2020-08-31

## 2020-07-20 MED ORDER — FUROSEMIDE 20 MG PO TABS: ORAL_TABLET | ORAL | 1 refills | Status: DC

## 2020-07-20 NOTE — Patient Instructions (Addendum)
Your EKG showed a fast heart rate today  Please take all new medication as prescribed - the lasix   Please continue all other medications as before.  Please have the pharmacy call with any other refills you may need.  Please keep your appointments with your specialists as you may have planned  You will be contacted regarding the referral for: Cardiology, Neurology, and Infectious Disease  You will be contacted regarding the referral for: Echocardiogram  Please go to the XRAY Department in the first floor for the x-ray testing at the [address] site  Please go to the LAB at the blood drawing area for the tests to be done at the [address] site  Please see Dr Yetta Barre in 1 week or next available  .

## 2020-07-20 NOTE — Telephone Encounter (Signed)
   Patient currently at the pharmacy upset Carvedilol not approved by DR Jonny Ruiz. Patient saw Dr Jonny Ruiz today.  Please call

## 2020-07-20 NOTE — Progress Notes (Signed)
Patient ID: Harold Flores, male   DOB: 1990/12/05, 30 y.o.   MRN: [id number]        Chief Complaint: bilateral leg edema, gait difficulty/change, bilateral LE paresthesias, HTN and tachycardia       HPI:  Harold Flores is a 30 y.o. male who currently is without health insurance, admits to noncompliance with medication health care follow up, and is adamant not going to UC or ED, here with his male spouse with above, all gradually worsening in the past 2-3 wks for unclear reasons, but without falls or injury or back pain.   Pt denies fever, wt loss, night sweats, loss of appetite, or other constitutional symptoms  Has ongoing anxiety and he wants to attribute the tachycardia to this.  Pt denies chest pain, increased sob or doe, wheezing, orthopnea, PND, palpitations, or syncope.  Has chronic HIV+ without medication use or recent ID follow up.  Covid neg tested 2 days ago. .        Wt Readings from Last 3 Encounters:  09/09/19 185 lb (83.9 kg)  09/08/18 191 lb (86.6 kg)  08/06/18 197 lb 8 oz (89.6 kg)   BP Readings from Last 3 Encounters:  07/20/20 (!) 142/78  09/09/19 (!) 144/94  09/08/18 (!) 146/100         Past Medical History:  Diagnosis Date  . Anal fissure   . Anal fistula   . Anal ulcer   . Asthma   . Epistaxis   . GAD (generalized anxiety disorder)   . GERD (gastroesophageal reflux disease)   . Hemorrhoid   . HIV (human immunodeficiency virus infection) (HCC)   . Hypertension   . Syphilis   . Varicella    Past Surgical History:  Procedure Laterality Date  . EYE SURGERY  1994   blephroplasty right eye    reports that he has been smoking e-cigarettes and cigarettes. He has a 2.50 pack-year smoking history. He has never used smokeless tobacco. He reports current alcohol use of about 7.0 standard drinks of alcohol per week. He reports current drug use. Drug: Marijuana. family history includes Diabetes in his maternal grandmother and mother; Heart disease in his  paternal grandfather; Hyperlipidemia in his mother. No Known Allergies Current Outpatient Medications on File Prior to Visit  Medication Sig Dispense Refill  . chlorthalidone (HYGROTON) 25 MG tablet Take 1 tablet (25 mg total) by mouth daily. 30 tablet 0  . Cholecalciferol (VITAMIN D3) 1.25 MG (50000 UT) CAPS TAKE 1 CAPSULE BY MOUTH ONE TIME PER WEEK 4 capsule 0  . Multiple Vitamin (MULTIVITAMIN) tablet Take 1 tablet by mouth daily.    . sertraline (ZOLOFT) 100 MG tablet Take 1 tablet (100 mg total) by mouth daily. 90 tablet 1  . TRIUMEQ 600-50-300 MG tablet TAKE 1 TABLET BY MOUTH EVERY DAY 30 tablet 2  . valACYclovir (VALTREX) 1000 MG tablet Take 1 tablet (1,000 mg total) by mouth 2 (two) times daily. 20 tablet 2   No current facility-administered medications on file prior to visit.        ROS:  All others reviewed and negative.  Objective        PE:  BP (!) 142/78   Pulse (!) 125   Temp 98.5 F (36.9 C) (Oral)   Ht 5\' 10"  (1.778 m)   SpO2 99%   BMI 26.54 kg/m                 Constitutional: Pt appears in NAD but  fatigued               HENT: Head: NCAT.                Right Ear: External ear normal.                 Left Ear: External ear normal.                Eyes: . Pupils are equal, round, and reactive to light. Conjunctivae and EOM are normal               Nose: without d/c or deformity               Neck: Neck supple. Gross normal ROM               Cardiovascular: tachy rate and regular rhythm.                 Pulmonary/Chest: Effort normal and breath sounds without rales or wheezing.                Abd:  Soft, NT, ND, + BS, no organomegaly               Neurological: Pt is alert. At baseline orientation, motor grossly intact but gait is abnormal with awkward stuttering steps               Skin: Skin is warm. No rashes, no other new lesions, LE edema - 1-2+ to knees               Psychiatric: Pt behavior is normal without agitation , 2+ nervous  Micro: none  Cardiac  tracings I have personally interpreted today:  Sinus tachycardia with non specific changes  Pertinent Radiological findings (summarize): none   Lab Results  Component Value Date   WBC 4.8 09/09/2019   HGB 15.0 09/09/2019   HCT 45.0 09/09/2019   PLT 243.0 09/09/2019   GLUCOSE 98 09/09/2019   CHOL 104 09/09/2019   TRIG 107.0 09/09/2019   HDL 39.60 09/09/2019   LDLCALC 43 09/09/2019   ALT 18 08/25/2018   AST 22 08/25/2018   NA 140 09/09/2019   K 3.7 09/09/2019   CL 101 09/09/2019   CREATININE 0.82 09/09/2019   BUN 7 09/09/2019   CO2 31 09/09/2019   TSH 0.42 04/23/2016   HGBA1C 5.5 04/23/2016   Assessment/Plan:  Harold Flores is a 30 y.o. Black or African American [2] male with  has a past medical history of Anal fissure, Anal fistula, Anal ulcer, Asthma, Epistaxis, GAD (generalized anxiety disorder), GERD (gastroesophageal reflux disease), Hemorrhoid, HIV (human immunodeficiency virus infection) (HCC), Hypertension, Syphilis, and Varicella.  Human immunodeficiency virus (HIV) disease (HCC) For cd4 and hiv rna counts, refer ID, encouraged complaince with meds  Essential hypertension, benign Uncontrolled, encouraged compliance with tx, and restart meds  Tachycardia Does not appear low volume related, could be anxiety but I suspect other more physical cause, will need evaluation as ordered, though it is not all clear he will do this as has no insurance  Smoker Counseled to quit  Vitamin D deficiency disease Encouraged vit D3 2000 u qd otc  Gait disorder With bilateral LE paresthesias as well without back pain; I suspect a significant neurological disorder that needs urgent evaluation but he adamant declines ED or UC, so will try to refer to neurology  Paresthesia of both lower extremities Also for B12 with labs  Abnormal ECG Sinus  tach with non specific changes, cont to follow  Peripheral edema etiollogy unclear, for lasix 20 qam with 20 pm prn persistent  swelling, and will order cxr, echo and labs as ordered, as well as cardiology referral but again not clear he will do this due to cost, anxiety and non compliance with recommended medical treatments  Followup: Return in about 1 week (around 07/27/2020), or if symptoms worsen or fail to improve.  Oliver Barre, MD 07/23/2020 9:05 PM [facility]

## 2020-07-20 NOTE — Telephone Encounter (Signed)
Patient calling back yelling and doesn't understand why the medicine was not sent in yet.

## 2020-07-20 NOTE — Telephone Encounter (Signed)
I have not seen him recently enough to prescribe

## 2020-07-20 NOTE — Telephone Encounter (Signed)
Spoke to the patient  Harold Flores sent in a refill Ive already let pt know to have a schedule ov before it runs out.

## 2020-07-23 ENCOUNTER — Encounter: Payer: Self-pay | Admitting: Internal Medicine

## 2020-07-23 DIAGNOSIS — R202 Paresthesia of skin: Secondary | ICD-10-CM | POA: Insufficient documentation

## 2020-07-23 DIAGNOSIS — R9431 Abnormal electrocardiogram [ECG] [EKG]: Secondary | ICD-10-CM | POA: Insufficient documentation

## 2020-07-23 DIAGNOSIS — R609 Edema, unspecified: Secondary | ICD-10-CM | POA: Insufficient documentation

## 2020-07-23 NOTE — Assessment & Plan Note (Signed)
Also for B12 with labs 

## 2020-07-23 NOTE — Assessment & Plan Note (Signed)
Sinus tach with non specific changes, cont to follow

## 2020-07-23 NOTE — Assessment & Plan Note (Signed)
Encouraged vit D3 2000 u qd otc

## 2020-07-23 NOTE — Assessment & Plan Note (Signed)
Counseled to quit 

## 2020-07-23 NOTE — Assessment & Plan Note (Signed)
For cd4 and hiv rna counts, refer ID, encouraged complaince with meds

## 2020-07-23 NOTE — Assessment & Plan Note (Addendum)
etiollogy unclear, for lasix 20 qam with 20 pm prn persistent swelling, and will order cxr, echo and labs as ordered, as well as cardiology referral but again not clear he will do this due to cost, anxiety and non compliance with recommended medical treatments

## 2020-07-23 NOTE — Assessment & Plan Note (Signed)
With bilateral LE paresthesias as well without back pain; I suspect a significant neurological disorder that needs urgent evaluation but he adamant declines ED or UC, so will try to refer to neurology

## 2020-07-23 NOTE — Assessment & Plan Note (Signed)
Uncontrolled, encouraged compliance with tx, and restart meds

## 2020-07-23 NOTE — Assessment & Plan Note (Signed)
Does not appear low volume related, could be anxiety but I suspect other more physical cause, will need evaluation as ordered, though it is not all clear he will do this as has no insurance

## 2020-07-26 ENCOUNTER — Other Ambulatory Visit: Payer: Self-pay

## 2020-07-26 ENCOUNTER — Ambulatory Visit: Payer: Self-pay

## 2020-08-03 ENCOUNTER — Other Ambulatory Visit: Payer: Self-pay

## 2020-08-03 ENCOUNTER — Telehealth: Payer: Self-pay | Admitting: Internal Medicine

## 2020-08-03 ENCOUNTER — Emergency Department (HOSPITAL_COMMUNITY): Payer: Medicaid Other

## 2020-08-03 ENCOUNTER — Inpatient Hospital Stay (HOSPITAL_COMMUNITY)
Admission: EM | Admit: 2020-08-03 | Discharge: 2020-08-15 | DRG: 975 | Disposition: A | Discharge status: Rehabilitation Facility | Payer: Medicaid Other | Attending: Internal Medicine | Admitting: Internal Medicine

## 2020-08-03 ENCOUNTER — Encounter (HOSPITAL_COMMUNITY): Payer: Self-pay

## 2020-08-03 DIAGNOSIS — G610 Guillain-Barre syndrome: Secondary | ICD-10-CM | POA: Diagnosis not present

## 2020-08-03 DIAGNOSIS — E876 Hypokalemia: Secondary | ICD-10-CM | POA: Diagnosis present

## 2020-08-03 DIAGNOSIS — F411 Generalized anxiety disorder: Secondary | ICD-10-CM | POA: Diagnosis present

## 2020-08-03 DIAGNOSIS — F1010 Alcohol abuse, uncomplicated: Secondary | ICD-10-CM

## 2020-08-03 DIAGNOSIS — Z79899 Other long term (current) drug therapy: Secondary | ICD-10-CM

## 2020-08-03 DIAGNOSIS — R197 Diarrhea, unspecified: Secondary | ICD-10-CM | POA: Diagnosis not present

## 2020-08-03 DIAGNOSIS — E538 Deficiency of other specified B group vitamins: Secondary | ICD-10-CM | POA: Diagnosis present

## 2020-08-03 DIAGNOSIS — R778 Other specified abnormalities of plasma proteins: Secondary | ICD-10-CM

## 2020-08-03 DIAGNOSIS — Z9114 Patient's other noncompliance with medication regimen: Secondary | ICD-10-CM | POA: Diagnosis not present

## 2020-08-03 DIAGNOSIS — D649 Anemia, unspecified: Secondary | ICD-10-CM | POA: Diagnosis not present

## 2020-08-03 DIAGNOSIS — E871 Hypo-osmolality and hyponatremia: Secondary | ICD-10-CM | POA: Diagnosis present

## 2020-08-03 DIAGNOSIS — F10239 Alcohol dependence with withdrawal, unspecified: Secondary | ICD-10-CM | POA: Diagnosis present

## 2020-08-03 DIAGNOSIS — B20 Human immunodeficiency virus [HIV] disease: Secondary | ICD-10-CM | POA: Diagnosis present

## 2020-08-03 DIAGNOSIS — K219 Gastro-esophageal reflux disease without esophagitis: Secondary | ICD-10-CM | POA: Diagnosis present

## 2020-08-03 DIAGNOSIS — E860 Dehydration: Secondary | ICD-10-CM

## 2020-08-03 DIAGNOSIS — I10 Essential (primary) hypertension: Secondary | ICD-10-CM | POA: Diagnosis present

## 2020-08-03 DIAGNOSIS — R64 Cachexia: Secondary | ICD-10-CM | POA: Diagnosis present

## 2020-08-03 DIAGNOSIS — Z8249 Family history of ischemic heart disease and other diseases of the circulatory system: Secondary | ICD-10-CM | POA: Diagnosis not present

## 2020-08-03 DIAGNOSIS — R40244 Other coma, without documented Glasgow coma scale score, or with partial score reported, unspecified time: Secondary | ICD-10-CM | POA: Diagnosis not present

## 2020-08-03 DIAGNOSIS — R202 Paresthesia of skin: Secondary | ICD-10-CM

## 2020-08-03 DIAGNOSIS — Z6826 Body mass index (BMI) 26.0-26.9, adult: Secondary | ICD-10-CM

## 2020-08-03 DIAGNOSIS — F17290 Nicotine dependence, other tobacco product, uncomplicated: Secondary | ICD-10-CM | POA: Diagnosis present

## 2020-08-03 DIAGNOSIS — D638 Anemia in other chronic diseases classified elsewhere: Secondary | ICD-10-CM | POA: Diagnosis present

## 2020-08-03 DIAGNOSIS — G629 Polyneuropathy, unspecified: Secondary | ICD-10-CM | POA: Diagnosis present

## 2020-08-03 DIAGNOSIS — R531 Weakness: Secondary | ICD-10-CM | POA: Diagnosis not present

## 2020-08-03 DIAGNOSIS — J45909 Unspecified asthma, uncomplicated: Secondary | ICD-10-CM | POA: Diagnosis present

## 2020-08-03 DIAGNOSIS — R5381 Other malaise: Secondary | ICD-10-CM | POA: Diagnosis not present

## 2020-08-03 DIAGNOSIS — R443 Hallucinations, unspecified: Secondary | ICD-10-CM | POA: Diagnosis present

## 2020-08-03 DIAGNOSIS — G9340 Encephalopathy, unspecified: Secondary | ICD-10-CM

## 2020-08-03 DIAGNOSIS — R29898 Other symptoms and signs involving the musculoskeletal system: Secondary | ICD-10-CM | POA: Diagnosis not present

## 2020-08-03 DIAGNOSIS — I959 Hypotension, unspecified: Secondary | ICD-10-CM | POA: Diagnosis present

## 2020-08-03 DIAGNOSIS — Z21 Asymptomatic human immunodeficiency virus [HIV] infection status: Secondary | ICD-10-CM

## 2020-08-03 DIAGNOSIS — R569 Unspecified convulsions: Secondary | ICD-10-CM | POA: Diagnosis present

## 2020-08-03 DIAGNOSIS — R4182 Altered mental status, unspecified: Secondary | ICD-10-CM

## 2020-08-03 DIAGNOSIS — Z20822 Contact with and (suspected) exposure to covid-19: Secondary | ICD-10-CM | POA: Diagnosis present

## 2020-08-03 DIAGNOSIS — R7989 Other specified abnormal findings of blood chemistry: Secondary | ICD-10-CM

## 2020-08-03 DIAGNOSIS — R7401 Elevation of levels of liver transaminase levels: Secondary | ICD-10-CM | POA: Diagnosis not present

## 2020-08-03 DIAGNOSIS — R000 Tachycardia, unspecified: Secondary | ICD-10-CM | POA: Diagnosis not present

## 2020-08-03 DIAGNOSIS — I248 Other forms of acute ischemic heart disease: Secondary | ICD-10-CM | POA: Diagnosis present

## 2020-08-03 DIAGNOSIS — G9341 Metabolic encephalopathy: Secondary | ICD-10-CM | POA: Diagnosis present

## 2020-08-03 DIAGNOSIS — R600 Localized edema: Secondary | ICD-10-CM | POA: Diagnosis not present

## 2020-08-03 DIAGNOSIS — D72819 Decreased white blood cell count, unspecified: Secondary | ICD-10-CM | POA: Diagnosis not present

## 2020-08-03 LAB — CBC WITH DIFFERENTIAL/PLATELET
Abs Immature Granulocytes: 0.28 10*3/uL — ABNORMAL HIGH (ref 0.00–0.07)
Basophils Absolute: 0 10*3/uL (ref 0.0–0.1)
Basophils Relative: 0 %
Eosinophils Absolute: 0 10*3/uL (ref 0.0–0.5)
Eosinophils Relative: 0 %
HCT: 25.3 % — ABNORMAL LOW (ref 39.0–52.0)
Hemoglobin: 9.1 g/dL — ABNORMAL LOW (ref 13.0–17.0)
Immature Granulocytes: 4 %
Lymphocytes Relative: 14 %
Lymphs Abs: 0.9 10*3/uL (ref 0.7–4.0)
MCH: 32.7 pg (ref 26.0–34.0)
MCHC: 36 g/dL (ref 30.0–36.0)
MCV: 91 fL (ref 80.0–100.0)
Monocytes Absolute: 0.9 10*3/uL (ref 0.1–1.0)
Monocytes Relative: 14 %
Neutro Abs: 4.3 10*3/uL (ref 1.7–7.7)
Neutrophils Relative %: 68 %
Platelets: 452 10*3/uL — ABNORMAL HIGH (ref 150–400)
RBC: 2.78 MIL/uL — ABNORMAL LOW (ref 4.22–5.81)
RDW: 18.1 % — ABNORMAL HIGH (ref 11.5–15.5)
WBC: 6.4 10*3/uL (ref 4.0–10.5)
nRBC: 0.9 % — ABNORMAL HIGH (ref 0.0–0.2)

## 2020-08-03 LAB — PHOSPHORUS: Phosphorus: 4.5 mg/dL (ref 2.5–4.6)

## 2020-08-03 LAB — TROPONIN I (HIGH SENSITIVITY): Troponin I (High Sensitivity): 339 ng/L (ref <18)

## 2020-08-03 LAB — COMPREHENSIVE METABOLIC PANEL
ALT: 19 U/L (ref 0–44)
AST: 51 U/L — ABNORMAL HIGH (ref 15–41)
Albumin: 2.3 g/dL — ABNORMAL LOW (ref 3.5–5.0)
Alkaline Phosphatase: 86 U/L (ref 38–126)
Anion gap: 23 — ABNORMAL HIGH (ref 5–15)
BUN: 36 mg/dL — ABNORMAL HIGH (ref 6–20)
CO2: 30 mmol/L (ref 22–32)
Calcium: 7.3 mg/dL — ABNORMAL LOW (ref 8.9–10.3)
Chloride: 79 mmol/L — ABNORMAL LOW (ref 98–111)
Creatinine, Ser: 1.05 mg/dL (ref 0.61–1.24)
GFR, Estimated: >60 mL/min (ref >60)
Glucose, Bld: 93 mg/dL (ref 70–99)
Potassium: 2 mmol/L — CL (ref 3.5–5.1)
Sodium: 132 mmol/L — ABNORMAL LOW (ref 135–145)
Total Bilirubin: 1.9 mg/dL — ABNORMAL HIGH (ref 0.3–1.2)
Total Protein: 6 g/dL — ABNORMAL LOW (ref 6.5–8.1)

## 2020-08-03 LAB — FOLATE: Folate: 3.4 ng/mL — ABNORMAL LOW (ref >5.9)

## 2020-08-03 LAB — MAGNESIUM: Magnesium: 1.4 mg/dL — ABNORMAL LOW (ref 1.7–2.4)

## 2020-08-03 MED ORDER — LORAZEPAM 2 MG/ML IJ SOLN
1.0000 mg | INTRAMUSCULAR | Status: AC | PRN
  Administered 2020-08-06: 1 mg via INTRAVENOUS
  Filled 2020-08-03 (×2): qty 1

## 2020-08-03 MED ORDER — FOLIC ACID 1 MG PO TABS
1.0000 mg | ORAL_TABLET | Freq: Every day | ORAL | Status: DC
  Administered 2020-08-04 – 2020-08-09 (×6): 1 mg via ORAL
  Filled 2020-08-03 (×6): qty 1

## 2020-08-03 MED ORDER — ADULT MULTIVITAMIN W/MINERALS CH: 1.0000 | ORAL_TABLET | Freq: Every day | ORAL | Status: DC

## 2020-08-03 MED ORDER — LORAZEPAM 1 MG PO TABS: 1.0000 mg | ORAL_TABLET | ORAL | Status: DC | PRN

## 2020-08-03 MED ORDER — POTASSIUM CHLORIDE 10 MEQ/100ML IV SOLN
10.0000 meq | INTRAVENOUS | Status: AC
Start: 2020-08-03 — End: 2020-08-04
  Administered 2020-08-03 – 2020-08-04 (×5): 10 meq via INTRAVENOUS
  Filled 2020-08-03 (×5): qty 100

## 2020-08-03 MED ORDER — FOLIC ACID 1 MG PO TABS
1.0000 mg | ORAL_TABLET | Freq: Once | ORAL | Status: AC
  Administered 2020-08-03: 1 mg via ORAL
  Filled 2020-08-03: qty 1

## 2020-08-03 MED ORDER — LORAZEPAM 2 MG/ML IJ SOLN
1.0000 mg | INTRAMUSCULAR | Status: DC | PRN
  Administered 2020-08-04: 1 mg via INTRAVENOUS

## 2020-08-03 MED ORDER — THIAMINE HCL 100 MG PO TABS
100.0000 mg | ORAL_TABLET | Freq: Every day | ORAL | Status: DC
  Administered 2020-08-04 – 2020-08-09 (×6): 100 mg via ORAL
  Filled 2020-08-03 (×6): qty 1

## 2020-08-03 MED ORDER — LORAZEPAM 1 MG PO TABS: 1.0000 mg | ORAL_TABLET | ORAL | Status: AC | PRN

## 2020-08-03 MED ORDER — THIAMINE HCL 100 MG/ML IJ SOLN
Freq: Once | INTRAVENOUS | Status: AC
  Filled 2020-08-03 (×2): qty 1000

## 2020-08-03 MED ORDER — ADULT MULTIVITAMIN W/MINERALS CH
1.0000 | ORAL_TABLET | Freq: Every day | ORAL | Status: DC
  Administered 2020-08-04 – 2020-08-15 (×12): 1 via ORAL
  Filled 2020-08-03 (×12): qty 1

## 2020-08-03 MED ORDER — ONDANSETRON HCL 4 MG PO TABS: 4.0000 mg | ORAL_TABLET | Freq: Four times a day (QID) | ORAL | Status: DC | PRN

## 2020-08-03 MED ORDER — THIAMINE HCL 100 MG/ML IJ SOLN: 100.0000 mg | Freq: Every day | INTRAMUSCULAR | Status: DC

## 2020-08-03 MED ORDER — THIAMINE HCL 100 MG PO TABS
100.0000 mg | ORAL_TABLET | Freq: Once | ORAL | Status: AC
  Administered 2020-08-03: 100 mg via ORAL
  Filled 2020-08-03: qty 1

## 2020-08-03 MED ORDER — SODIUM CHLORIDE 0.9 % IV BOLUS
1000.0000 mL | Freq: Once | INTRAVENOUS | Status: AC
  Administered 2020-08-03: 1000 mL via INTRAVENOUS

## 2020-08-03 MED ORDER — ONDANSETRON HCL 4 MG/2ML IJ SOLN: 4.0000 mg | Freq: Four times a day (QID) | INTRAMUSCULAR | Status: DC | PRN

## 2020-08-03 MED ORDER — POTASSIUM CHLORIDE CRYS ER 20 MEQ PO TBCR
40.0000 meq | EXTENDED_RELEASE_TABLET | Freq: Once | ORAL | Status: AC
  Administered 2020-08-03: 40 meq via ORAL
  Filled 2020-08-03: qty 2

## 2020-08-03 NOTE — ED Provider Notes (Signed)
[facility] [place] [facility]-EMERGENCY DEPT Provider Note   CSN: [id number] Arrival date & time: 08/03/20  5284     History Chief Complaint  Patient presents with  . Fatigue    Harold Flores is a 30 y.o. male.  Patient is a 30 year old male with a history of untreated HIV, hypertension, heavy alcohol use who is presenting today with complaints of pain in his arms and legs, poor oral intake and generalized weakness.  Patient reports that he saw his doctor approximately 2 weeks ago due to generalized weakness, leg swelling and not feeling well.  They were concerned that he may have CHF and started him on Lasix.  He has been taking Lasix the last 2 weeks but things have only worsened.  He has noticed the swelling in his legs are gone but he now has no energy at all is not eating and feels terrible.  He denies any shortness of breath, chest pain or abdominal pain.  He does have intermittent diarrhea.  He denies any fevers.  He has no known history of heart disease but is still waiting to get an echo.  He is urinating frequently but it has recently decreased because he is not drinking anything.   Patient does admit that he drinks at least 4 drinks or more per day and if he does not have alcohol he does get a little bit shaky.  He just does not have an appetite so just does not eat much but is not aware of any significant weight loss.  The history is provided by the patient.       Past Medical History:  Diagnosis Date  . Anal fissure   . Anal fistula   . Anal ulcer   . Asthma   . Epistaxis   . GAD (generalized anxiety disorder)   . GERD (gastroesophageal reflux disease)   . Hemorrhoid   . HIV (human immunodeficiency virus infection) (HCC)   . Hypertension   . Syphilis   . Varicella     Patient Active Problem List   Diagnosis Date Noted  . Paresthesia of both lower extremities 07/23/2020  . Peripheral edema 07/23/2020  . Abnormal ECG 07/23/2020  . Gait disorder 07/20/2020   . Smoker 09/08/2018  . Medication monitoring encounter 09/08/2018  . Vitamin D deficiency disease 04/29/2018  . Screening examination for venereal disease 06/18/2016  . Human immunodeficiency virus (HIV) disease (HCC) 02/22/2015  . Genital herpes in men 01/04/2015  . Gastroesophageal reflux disease without esophagitis 01/04/2015  . Tachycardia 12/21/2014  . Syphili, latent 10/09/2012  . Routine general medical examination at a health care facility 10/08/2012  . Essential hypertension, benign 10/08/2012  . GAD (generalized anxiety disorder) 03/12/2012  . Mild persistent asthma 05/06/2007    Past Surgical History:  Procedure Laterality Date  . EYE SURGERY  1994   blephroplasty right eye       Family History  Problem Relation Age of Onset  . Diabetes Mother   . Hyperlipidemia Mother   . Diabetes Maternal Grandmother   . Heart disease Paternal Grandfather   . Cancer Neg Hx     Social History   Tobacco Use  . Smoking status: Current Every Day Smoker    Packs/day: 0.50    Years: 5.00    Pack years: 2.50    Types: E-cigarettes, Cigarettes  . Smokeless tobacco: Never Used  Substance Use Topics  . Alcohol use: Yes    Alcohol/week: 7.0 standard drinks    Types: 7  Cans of beer per week    Comment: Rarely  . Drug use: Yes    Types: Marijuana    Home Medications Prior to Admission medications   Medication Sig Start Date End Date Taking? Authorizing Provider  carvedilol (COREG) 12.5 MG tablet 1 tab by mouth twice per day 07/20/20   Corwin Levins, MD  chlorthalidone (HYGROTON) 25 MG tablet Take 1 tablet (25 mg total) by mouth daily. 02/07/20   Etta Grandchild, MD  Cholecalciferol (VITAMIN D3) 1.25 MG (50000 UT) CAPS TAKE 1 CAPSULE BY MOUTH ONE TIME PER WEEK 02/07/20   Etta Grandchild, MD  furosemide (LASIX) 20 MG tablet 1 tab by mouth in the AM, and 1 in the PM for persistent swelling 07/20/20   Corwin Levins, MD  Multiple Vitamin (MULTIVITAMIN) tablet Take 1 tablet by mouth  daily.    [provider]  sertraline (ZOLOFT) 100 MG tablet Take 1 tablet (100 mg total) by mouth daily. 02/07/20   Etta Grandchild, MD  TRIUMEQ 600-50-300 MG tablet TAKE 1 TABLET BY MOUTH EVERY DAY 10/14/18   Comer, Belia Heman, MD  valACYclovir (VALTREX) 1000 MG tablet Take 1 tablet (1,000 mg total) by mouth 2 (two) times daily. 02/22/15   Comer, Belia Heman, MD    Allergies    Patient has no known allergies.  Review of Systems   Review of Systems  All other systems reviewed and are negative.   Physical Exam Updated Vital Signs BP (!) 92/51   Pulse (!) 11   Temp 98 F (36.7 C) (Oral)   Resp (!) 22   SpO2 97%   Physical Exam Vitals and nursing note reviewed.  Constitutional:      General: He is not in acute distress.    Appearance: He is well-developed and well-nourished.  HENT:     Head: Normocephalic and atraumatic.     Mouth/Throat:     Mouth: Oropharynx is clear and moist. Mucous membranes are dry.     Comments: Dry cracked lips Eyes:     Extraocular Movements: EOM normal.     Conjunctiva/sclera: Conjunctivae normal.     Pupils: Pupils are equal, round, and reactive to light.  Cardiovascular:     Rate and Rhythm: Regular rhythm. Tachycardia present.     Pulses: Intact distal pulses.     Heart sounds: No murmur heard.   Pulmonary:     Effort: Pulmonary effort is normal. No respiratory distress.     Breath sounds: Normal breath sounds. No wheezing or rales.  Chest:     Chest wall: No tenderness.  Abdominal:     General: There is no distension.     Palpations: Abdomen is soft.     Tenderness: There is no abdominal tenderness. There is no guarding or rebound.  Musculoskeletal:        General: No tenderness or edema. Normal range of motion.     Cervical back: Normal range of motion and neck supple.     Right lower leg: No edema.     Left lower leg: No edema.  Skin:    General: Skin is warm and dry.     Capillary Refill: Capillary refill takes more than 3  seconds.     Findings: No erythema or rash.  Neurological:     Mental Status: He is alert and oriented to person, place, and time. Mental status is at baseline.  Psychiatric:        Mood and Affect: Mood  and affect and mood normal.        Behavior: Behavior normal.        Thought Content: Thought content normal.      ED Results / Procedures / Treatments   Labs (all labs ordered are listed, but only abnormal results are displayed) Labs Reviewed  CBC WITH DIFFERENTIAL/PLATELET - Abnormal; Notable for the following components:      Result Value   RBC 2.78 (*)    Hemoglobin 9.1 (*)    HCT 25.3 (*)    RDW 18.1 (*)    Platelets 452 (*)    nRBC 0.9 (*)    Abs Immature Granulocytes 0.28 (*)    All other components within normal limits  COMPREHENSIVE METABOLIC PANEL - Abnormal; Notable for the following components:   Sodium 132 (*)    Potassium 2.0 (*)    Chloride 79 (*)    BUN 36 (*)    Calcium 7.3 (*)    Total Protein 6.0 (*)    Albumin 2.3 (*)    AST 51 (*)    Total Bilirubin 1.9 (*)    Anion gap 23 (*)    All other components within normal limits  SARS CORONAVIRUS 2 (TAT 6-24 HRS)  FOLATE  VITAMIN B1  TROPONIN I (HIGH SENSITIVITY)    EKG EKG Interpretation  Date/Time:  Thursday August 03 2020 20:34:18 EST Ventricular Rate:  114 PR Interval:    QRS Duration: 95 QT Interval:  345 QTC Calculation: 476 R Axis:   132 Text Interpretation: Sinus tachycardia Probable anterior infarct, age indeterminate Baseline wander in lead(s) I III aVR aVL No previous tracing Confirmed by Gwyneth Sproutlunkett, Lonita Debes ([id number]) on 08/03/2020 9:43:41 PM   Radiology DG Chest Port 1 View  Result Date: 08/03/2020 CLINICAL DATA:  Weakness, anorexia, HIV positive EXAM: PORTABLE CHEST 1 VIEW COMPARISON:  07/31/2017 FINDINGS: The heart size and mediastinal contours are within normal limits. Both lungs are clear. The visualized skeletal structures are unremarkable. IMPRESSION: No active disease.  Electronically Signed   By: Sharlet SalinaMichael  Brown M.D.   On: 08/03/2020 20:27    Procedures Procedures   Medications Ordered in ED Medications  potassium chloride 10 mEq in 100 mL IVPB (10 mEq Intravenous New Bag/Given 08/03/20 2149)  thiamine tablet 100 mg (has no administration in time range)  folic acid (FOLVITE) tablet 1 mg (has no administration in time range)  sodium chloride 0.9 % bolus 1,000 mL (1,000 mLs Intravenous New Bag/Given 08/03/20 2149)  potassium chloride SA (KLOR-CON) CR tablet 40 mEq (40 mEq Oral Given 08/03/20 2149)    ED Course  I have reviewed the triage vital signs and the nursing notes.  Pertinent labs & imaging results that were available during my care of the patient were reviewed by me and considered in my medical decision making (see chart for details).    MDM Rules/Calculators/A&P                          Patient is a 30 year old male presenting today with vague complaints of weakness, leg cramping and having no energy.  Patient does not appear fluid overloaded at this time but is tachycardic and hypotensive.  He has been taking Lasix for the last 2 weeks and reports he has had very poor oral intake.  Patient does drink daily and based on notes from his doctor and the patient himself he has not been eating well.  He had been having generalized weakness and  worsening swelling in his legs when he saw his doctor a few weeks ago however wonder if that is more related to vitamin to sit deficiency and poor nutrition.  Denies any chest pain or shortness of breath.  Patient's labs today show hemoglobin of 9.1 with a normal platelet count and white blood cell count.  Patient's CMP with a potassium of 2.0, hypochloremia of 79, low calcium of 7.3 and normal renal function.  Patient's anion gap is 23.  Folate and thiamine levels are checked as patient does endorse daily drinking and getting shaky when he does not drink.  Will place patient on CIWA precautions.  Patient given multiple  rounds of oral and IV potassium.  Patient also given IV fluids due to concern for dehydration.  Will admit for further care.  MDM Number of Diagnoses or Management Options   Amount and/or Complexity of Data Reviewed Clinical lab tests: ordered and reviewed Tests in the radiology section of CPT: ordered and reviewed Tests in the medicine section of CPT: ordered and reviewed Decide to obtain previous medical records or to obtain history from someone other than the patient: yes Obtain history from someone other than the patient: yes Review and summarize past medical records: yes Discuss the patient with other providers: yes Independent visualization of images, tracings, or specimens: yes  Risk of Complications, Morbidity, and/or Mortality Presenting problems: high Diagnostic procedures: high Management options: high  Patient Progress Patient progress: stable  CRITICAL CARE Performed by: Isebella Upshur Total critical care time: 30 minutes Critical care time was exclusive of separately billable procedures and treating other patients. Critical care was necessary to treat or prevent imminent or life-threatening deterioration. Critical care was time spent personally by me on the following activities: development of treatment plan with patient and/or surrogate as well as nursing, discussions with consultants, evaluation of patient's response to treatment, examination of patient, obtaining history from patient or surrogate, ordering and performing treatments and interventions, ordering and review of laboratory studies, ordering and review of radiographic studies, pulse oximetry and re-evaluation of patient's condition.  Final Clinical Impression(s) / ED Diagnoses Final diagnoses:  Hypokalemia  Dehydration  Anemia, unspecified type    Rx / DC Orders ED Discharge Orders    None       Gwyneth Sprout, MD 08/03/20 2207

## 2020-08-03 NOTE — ED Triage Notes (Signed)
Patient BIB [facility] from home. Main complaint is weakness, has not been eating/drinking much. Patient has history of HTN, anxiety. Recently seen at physician for congestive heart failure possibility. Patient has had difficulty walking lately.   EMS vitals 102 BP palpated HR 110 EMS gave fluid CBG 113  18G Left AC

## 2020-08-03 NOTE — Telephone Encounter (Signed)
[facility]:  Caller states her son is barely eating, weak, fatigued, and parent thinks he is dehydrated. Caller states he has been sick for about the last 3 weeks. Was seen in office 2.17 and was told he may have CHF. Only drinking a few sips of fluid. Last UOP was early this AM and was only about 1 TBS of urine. Incontinent of bowel. PT w/ HIV   [facility] has advised for the caller to cal EMS [phone]. Patient and caller both understood.

## 2020-08-03 NOTE — ED Notes (Signed)
Spoke with Pharmacist and she verified that Potassium Chloride is compatible with Normal saline 0.9% with thiamine, folic acid, multivitamins infusion that this patient will be receiving.

## 2020-08-03 NOTE — Telephone Encounter (Signed)
° °  Patient's mother calling crying states patient is unable to walk, not eating or drinking. Call transferred to [facility]

## 2020-08-03 NOTE — H&P (Signed)
History and Physical   ARIEL WINGROVE BPZ:[id number] DOB: [birth year] DOA: 08/03/2020  Referring MD/NP/PA: Dr. Anitra Lauth  PCP: Etta Grandchild, MD   Outpatient Specialists: Dr. Staci Righter,, infectious disease  Patient coming from: Home  Chief Complaint: Generalized weakness  HPI: Harold Flores is a 30 y.o. 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  At the time he was having generalized weakness and not feeling well.  He has been taking the Lasix for the last 2 weeks but symptoms have been getting worse.  The swelling in the legs have continued and not decreasing.  No shortness of breath no chest pain.  Denied any PND orthopnea.  He has been going to the bathroom and continues to drink.  He continues to use his alcohol.  Has had decreased appetite and loss of weight.  Came to the ER where he was evaluated.  Patient was found to have's significantly low potassium.  Also appears to be slightly tremulous and may be early alcohol withdrawals.  Patient being admitted to the hospital with severe hypokalemia and worsening pedal edema.  ED Course: Temperature 98 blood pressure 88/36 pulse 118 respiratory rate 24 and oxygen sat 94% room air.  Sodium 132 potassium 2.0 chloride 79 CO2 30 glucose 93 BUN 36 creatinine 1.05 calcium 7.3 and gap of 23.  Total bilirubin 1.9 with albumin 2.3.  Troponin is 339-second 308.  White count 6.4 hemoglobin 9.1 and platelets 452.  Patient's last recorded CD4 count was in 2020 and that was 420.  Patient will be admitted for further evaluation and treatment.  Review of Systems: As per HPI otherwise 10 point review of systems negative.    Past Medical History:  Diagnosis Date  . Anal fissure   . Anal  fistula   . Anal ulcer   . Asthma   . Epistaxis   . GAD (generalized anxiety disorder)   . GERD (gastroesophageal reflux disease)   . Hemorrhoid   . HIV (human immunodeficiency virus infection) (HCC)   . Hypertension   . Syphilis   . Varicella     Past Surgical History:  Procedure Laterality Date  . EYE SURGERY  1994   blephroplasty right eye     reports that he has been smoking e-cigarettes and cigarettes. He has a 2.50 pack-year smoking history. He has never used smokeless tobacco. He reports current alcohol use of about 7.0 standard drinks of alcohol per week. He reports current drug use. Drug: Marijuana.  No Known Allergies  Family History  Problem Relation Age of Onset  . Diabetes Mother   . Hyperlipidemia Mother   . Diabetes Maternal Grandmother   . Heart disease Paternal Grandfather   . Cancer Neg Hx      Prior to Admission medications   Medication Sig Start Date End Date Taking? Authorizing Provider  carvedilol (COREG) 12.5 MG tablet 1 tab by mouth twice per day 07/20/20   Corwin Levins, MD  chlorthalidone (HYGROTON) 25 MG tablet Take 1 tablet (25 mg total) by mouth daily. 02/07/20   Etta Grandchild, MD  Cholecalciferol (VITAMIN D3) 1.25 MG (50000 UT) CAPS TAKE 1 CAPSULE BY MOUTH ONE TIME PER WEEK 02/07/20   Etta Grandchild, MD  furosemide (LASIX) 20 MG tablet 1  tab by mouth in the AM, and 1 in the PM for persistent swelling 07/20/20   Corwin LevinsJohn, James W, MD  Multiple Vitamin (MULTIVITAMIN) tablet Take 1 tablet by mouth daily.    [provider]  sertraline (ZOLOFT) 100 MG tablet Take 1 tablet (100 mg total) by mouth daily. 02/07/20   Etta GrandchildJones, Thomas L, MD  TRIUMEQ 600-50-300 MG tablet TAKE 1 TABLET BY MOUTH EVERY DAY 10/14/18   Comer, Belia Hemanobert W, MD  valACYclovir (VALTREX) 1000 MG tablet Take 1 tablet (1,000 mg total) by mouth 2 (two) times daily. 02/22/15   Gardiner Barefootomer, Robert W, MD    Physical Exam: Vitals:   08/04/20 0300 08/04/20 0330 08/04/20 0400 08/04/20 0500  BP:  (!) 94/59 121/69 106/65 108/72  Pulse: (!) 118 (!) 113 (!) 101 100  Resp: 14 15 16 15   Temp:      TempSrc:      SpO2: 100% 98% 98% 97%      Constitutional: Chronically ill looking, cachectic Vitals:   08/04/20 0300 08/04/20 0330 08/04/20 0400 08/04/20 0500  BP: (!) 94/59 121/69 106/65 108/72  Pulse: (!) 118 (!) 113 (!) 101 100  Resp: 14 15 16 15   Temp:      TempSrc:      SpO2: 100% 98% 98% 97%   Eyes: PERRL, lids and conjunctivae normal ENMT: Mucous membranes are dry. Posterior pharynx clear of any exudate or lesions.Normal dentition.  Neck: normal, supple, no masses, no thyromegaly Respiratory: clear to auscultation bilaterally, no wheezing, no crackles. Normal respiratory effort. No accessory muscle use.  Cardiovascular: Sinus tachycardia, no murmurs / rubs / gallops.  2+ extremity edema. 2+ pedal pulses. No carotid bruits.  Abdomen: no tenderness, no masses palpated. No hepatosplenomegaly. Bowel sounds positive.  Musculoskeletal: no clubbing / cyanosis. No joint deformity upper and lower extremities. Good ROM, no contractures. Normal muscle tone.  Skin: no rashes, lesions, ulcers. No induration Neurologic: CN 2-12 grossly intact. Sensation intact, DTR normal. Strength 5/5 in all 4.  Psychiatric: Normal judgment and insight. Alert and oriented x 3.  Depressed mood.     Labs on Admission: I have personally reviewed following labs and imaging studies  CBC: Recent Labs  Lab 08/03/20 2032 08/04/20 0350  WBC 6.4 6.0  NEUTROABS 4.3  --   HGB 9.1* 8.9*  HCT 25.3* 25.1*  MCV 91.0 91.9  PLT 452* 427*   Basic Metabolic Panel: Recent Labs  Lab 08/03/20 2032 08/03/20 2208 08/04/20 0102 08/04/20 0350  NA 132*  --  131* 132*  K 2.0*  --  3.1* 3.3*  CL 79*  --  82* 86*  CO2 30  --  34* 33*  GLUCOSE 93  --  101* 78  BUN 36*  --  38* 35*  CREATININE 1.05  --  1.21 1.06  CALCIUM 7.3*  --  6.9* 6.7*  MG  --  1.4* 1.5*  --   PHOS  --  4.5 4.5  --    GFR: CrCl cannot be  calculated (Unknown ideal weight.). Liver Function Tests: Recent Labs  Lab 08/03/20 2032 08/04/20 0102 08/04/20 0350  AST 51* 58* 57*  ALT 19 18 18   ALKPHOS 86 78 77  BILITOT 1.9* 1.9* 2.0*  PROT 6.0* 5.2* 5.3*  ALBUMIN 2.3* 2.1* 2.1*   No results for input(s): LIPASE, AMYLASE in the last 168 hours. No results for input(s): AMMONIA in the last 168 hours. Coagulation Profile: No results for input(s): INR, PROTIME in the last 168 hours. Cardiac  Enzymes: No results for input(s): CKTOTAL, CKMB, CKMBINDEX, TROPONINI in the last 168 hours. BNP (last 3 results) No results for input(s): PROBNP in the last 8760 hours. HbA1C: No results for input(s): HGBA1C in the last 72 hours. CBG: No results for input(s): GLUCAP in the last 168 hours. Lipid Profile: No results for input(s): CHOL, HDL, LDLCALC, TRIG, CHOLHDL, LDLDIRECT in the last 72 hours. Thyroid Function Tests: No results for input(s): TSH, T4TOTAL, FREET4, T3FREE, THYROIDAB in the last 72 hours. Anemia Panel: Recent Labs    08/03/20 2200  FOLATE 3.4*   Urine analysis:    Component Value Date/Time   COLORURINE YELLOW 09/09/2019 1014   APPEARANCEUR CLEAR 09/09/2019 1014   LABSPEC 1.025 09/09/2019 1014   PHURINE 6.5 09/09/2019 1014   GLUCOSEU NEGATIVE 09/09/2019 1014   HGBUR NEGATIVE 09/09/2019 1014   BILIRUBINUR NEGATIVE 09/09/2019 1014   KETONESUR TRACE (A) 09/09/2019 1014   PROTEINUR NEGATIVE 02/02/2015 1412   UROBILINOGEN 1.0 09/09/2019 1014   NITRITE NEGATIVE 09/09/2019 1014   LEUKOCYTESUR NEGATIVE 09/09/2019 1014   Sepsis Labs: @LABRCNTIP (procalcitonin:4,lacticidven:4) )No results found for this or any previous visit (from the past 240 hour(s)).   Radiological Exams on Admission: DG Chest Port 1 View  Result Date: 08/03/2020 CLINICAL DATA:  Weakness, anorexia, HIV positive EXAM: PORTABLE CHEST 1 VIEW COMPARISON:  07/31/2017 FINDINGS: The heart size and mediastinal contours are within normal limits. Both lungs  are clear. The visualized skeletal structures are unremarkable. IMPRESSION: No active disease. Electronically Signed   By: 08/02/2017 M.D.   On: 08/03/2020 20:27    EKG: Independently reviewed.  Shows no significant ST changes.  Assessment/Plan Principal Problem:   Hypokalemia Active Problems:   GAD (generalized anxiety disorder)   Essential hypertension, benign   Gastroesophageal reflux disease without esophagitis   Human immunodeficiency virus (HIV) disease (HCC)     #1 severe hypokalemia: Patient will be admitted for repletion.  Probably is because of his recent Lasix.  We will stop his diuretics.  Monitor closely as we gently replete his potassium.  Place patient on telemetry monitoring.  #2  Lower extremity edema: Probably multifactorial.  Patient has low albumin.  Also will be dependent edema.  Echocardiogram has been ordered outpatient but not yet done.  We may proceed with that while in the house.  Holding diuretics due to severe hypokalemia.  Elevate the feet.  #3 HIV disease: Currently untreated.  Patient be monitored and counseling provided.  #4 GERD: Continue with PPIs  #5 essential hypertension: Resume on continue home regimen  #6 generalized anxiety disorder: Continue supportive care.   DVT prophylaxis: SCD Code Status: Full code Family Communication: No family at bedside Disposition Plan: Home Consults called: None Admission status: Inpatient  Severity of Illness: The appropriate patient status for this patient is INPATIENT. Inpatient status is judged to be reasonable and necessary in order to provide the required intensity of service to ensure the patient's safety. The patient's presenting symptoms, physical exam findings, and initial radiographic and laboratory data in the context of their chronic comorbidities is felt to place them at high risk for further clinical deterioration. Furthermore, it is not anticipated that the patient will be medically stable  for discharge from the hospital within 2 midnights of admission. The following factors support the patient status of inpatient.   " The patient's presenting symptoms include generalized weakness and fatigue. " The worrisome physical exam findings include cachexia chronic ill-looking. " The initial radiographic and laboratory data are worrisome because of  potassium of 2. " The chronic co-morbidities include HIV disease.   * I certify that at the point of admission it is my clinical judgment that the patient will require inpatient hospital care spanning beyond 2 midnights from the point of admission due to high intensity of service, high risk for further deterioration and high frequency of surveillance required.Lonia Blood MD [facility] Pager [phone]  If 7PM-7AM, please contact night-coverage [email or web address] Password [email or web address]  08/04/2020, 5:17 AM

## 2020-08-04 ENCOUNTER — Inpatient Hospital Stay (HOSPITAL_COMMUNITY): Payer: Medicaid Other

## 2020-08-04 DIAGNOSIS — E860 Dehydration: Secondary | ICD-10-CM

## 2020-08-04 DIAGNOSIS — R600 Localized edema: Secondary | ICD-10-CM

## 2020-08-04 DIAGNOSIS — I10 Essential (primary) hypertension: Secondary | ICD-10-CM

## 2020-08-04 DIAGNOSIS — D649 Anemia, unspecified: Secondary | ICD-10-CM

## 2020-08-04 LAB — COMPREHENSIVE METABOLIC PANEL
ALT: 18 U/L (ref 0–44)
ALT: 18 U/L (ref 0–44)
AST: 58 U/L — ABNORMAL HIGH (ref 15–41)
AST: 57 U/L — ABNORMAL HIGH (ref 15–41)
Albumin: 2.1 g/dL — ABNORMAL LOW (ref 3.5–5.0)
Albumin: 2.1 g/dL — ABNORMAL LOW (ref 3.5–5.0)
Alkaline Phosphatase: 78 U/L (ref 38–126)
Alkaline Phosphatase: 77 U/L (ref 38–126)
Anion gap: 15 (ref 5–15)
Anion gap: 13 (ref 5–15)
BUN: 38 mg/dL — ABNORMAL HIGH (ref 6–20)
BUN: 35 mg/dL — ABNORMAL HIGH (ref 6–20)
CO2: 34 mmol/L — ABNORMAL HIGH (ref 22–32)
CO2: 33 mmol/L — ABNORMAL HIGH (ref 22–32)
Calcium: 6.9 mg/dL — ABNORMAL LOW (ref 8.9–10.3)
Calcium: 6.7 mg/dL — ABNORMAL LOW (ref 8.9–10.3)
Chloride: 82 mmol/L — ABNORMAL LOW (ref 98–111)
Chloride: 86 mmol/L — ABNORMAL LOW (ref 98–111)
Creatinine, Ser: 1.21 mg/dL (ref 0.61–1.24)
Creatinine, Ser: 1.06 mg/dL (ref 0.61–1.24)
GFR, Estimated: >60 mL/min (ref >60)
GFR, Estimated: >60 mL/min (ref >60)
Glucose, Bld: 101 mg/dL — ABNORMAL HIGH (ref 70–99)
Glucose, Bld: 78 mg/dL (ref 70–99)
Potassium: 3.1 mmol/L — ABNORMAL LOW (ref 3.5–5.1)
Potassium: 3.3 mmol/L — ABNORMAL LOW (ref 3.5–5.1)
Sodium: 131 mmol/L — ABNORMAL LOW (ref 135–145)
Sodium: 132 mmol/L — ABNORMAL LOW (ref 135–145)
Total Bilirubin: 1.9 mg/dL — ABNORMAL HIGH (ref 0.3–1.2)
Total Bilirubin: 2 mg/dL — ABNORMAL HIGH (ref 0.3–1.2)
Total Protein: 5.2 g/dL — ABNORMAL LOW (ref 6.5–8.1)
Total Protein: 5.3 g/dL — ABNORMAL LOW (ref 6.5–8.1)

## 2020-08-04 LAB — IRON AND TIBC
Iron: 140 ug/dL (ref 45–182)
Saturation Ratios: 90 % — ABNORMAL HIGH (ref 17.9–39.5)
TIBC: 155 ug/dL — ABNORMAL LOW (ref 250–450)
UIBC: 15 ug/dL

## 2020-08-04 LAB — CD4/CD8 (T-HELPER/T-SUPPRESSOR CELL)
CD4 absolute: 40 /uL — ABNORMAL LOW (ref 400–1790)
CD4%: 6 % — ABNORMAL LOW (ref 33–65)
CD8 T Cell Abs: 548 /uL (ref 190–1000)
CD8tox: 82 % — ABNORMAL HIGH (ref 12–40)
Ratio: 0.07 — ABNORMAL LOW (ref 1.0–3.0)
Total lymphocyte count: 670 /uL — ABNORMAL LOW (ref 1000–4000)

## 2020-08-04 LAB — ECHOCARDIOGRAM COMPLETE
Area-P 1/2: 2.71 cm2
Height: 70 in
S' Lateral: 3.3 cm
Weight: 2920.65 oz

## 2020-08-04 LAB — CBC
HCT: 25.1 % — ABNORMAL LOW (ref 39.0–52.0)
Hemoglobin: 8.9 g/dL — ABNORMAL LOW (ref 13.0–17.0)
MCH: 32.6 pg (ref 26.0–34.0)
MCHC: 35.5 g/dL (ref 30.0–36.0)
MCV: 91.9 fL (ref 80.0–100.0)
Platelets: 427 10*3/uL — ABNORMAL HIGH (ref 150–400)
RBC: 2.73 MIL/uL — ABNORMAL LOW (ref 4.22–5.81)
RDW: 18.2 % — ABNORMAL HIGH (ref 11.5–15.5)
WBC: 6 10*3/uL (ref 4.0–10.5)
nRBC: 1 % — ABNORMAL HIGH (ref 0.0–0.2)

## 2020-08-04 LAB — TROPONIN I (HIGH SENSITIVITY): Troponin I (High Sensitivity): 308 ng/L (ref <18)

## 2020-08-04 LAB — FERRITIN: Ferritin: 824 ng/mL — ABNORMAL HIGH (ref 24–336)

## 2020-08-04 LAB — MAGNESIUM: Magnesium: 1.5 mg/dL — ABNORMAL LOW (ref 1.7–2.4)

## 2020-08-04 LAB — RETICULOCYTES
Immature Retic Fract: 20.9 % — ABNORMAL HIGH (ref 2.3–15.9)
RBC.: 2.71 MIL/uL — ABNORMAL LOW (ref 4.22–5.81)
Retic Count, Absolute: 42.3 10*3/uL (ref 19.0–186.0)
Retic Ct Pct: 1.6 % (ref 0.4–3.1)

## 2020-08-04 LAB — SARS CORONAVIRUS 2 (TAT 6-24 HRS): SARS Coronavirus 2: NEGATIVE

## 2020-08-04 LAB — PHOSPHORUS: Phosphorus: 4.5 mg/dL (ref 2.5–4.6)

## 2020-08-04 LAB — VITAMIN B12: Vitamin B-12: 726 pg/mL (ref 180–914)

## 2020-08-04 MED ORDER — MAGNESIUM SULFATE 2 GM/50ML IV SOLN
2.0000 g | Freq: Once | INTRAVENOUS | Status: AC
  Administered 2020-08-04: 2 g via INTRAVENOUS
  Filled 2020-08-04: qty 50

## 2020-08-04 MED ORDER — CARVEDILOL 6.25 MG PO TABS
6.2500 mg | ORAL_TABLET | Freq: Two times a day (BID) | ORAL | Status: DC
  Administered 2020-08-04 – 2020-08-13 (×18): 6.25 mg via ORAL
  Filled 2020-08-04 (×18): qty 1

## 2020-08-04 MED ORDER — ABACAVIR-DOLUTEGRAVIR-LAMIVUD 600-50-300 MG PO TABS
1.0000 | ORAL_TABLET | Freq: Every day | ORAL | Status: DC
  Administered 2020-08-04 – 2020-08-07 (×4): 1 via ORAL
  Filled 2020-08-04 (×4): qty 1

## 2020-08-04 MED ORDER — SERTRALINE HCL 100 MG PO TABS
100.0000 mg | ORAL_TABLET | Freq: Every day | ORAL | Status: DC
  Administered 2020-08-04 – 2020-08-15 (×12): 100 mg via ORAL
  Filled 2020-08-04 (×12): qty 1

## 2020-08-04 MED ORDER — POTASSIUM CHLORIDE CRYS ER 20 MEQ PO TBCR
40.0000 meq | EXTENDED_RELEASE_TABLET | Freq: Once | ORAL | Status: AC
  Administered 2020-08-04: 40 meq via ORAL
  Filled 2020-08-04: qty 2

## 2020-08-04 NOTE — ED Notes (Signed)
Dr. Mikeal Hawthorne aware of patients BP.

## 2020-08-04 NOTE — ED Notes (Signed)
[place], APP notified of patients BP.

## 2020-08-04 NOTE — ED Notes (Signed)
Called report to Monica, RN

## 2020-08-04 NOTE — [place] Notes (Addendum)
PROGRESS NOTE    Harold Flores  YOV:[id number] DOB: 22-Mar-1991 DOA: 08/03/2020 PCP: Etta Grandchild, MD    Chief Complaint  Patient presents with  . Fatigue    Brief Narrative:   30 year old gentleman prior history of HIV, noncompliant to medications, essential hypertension, alcohol abuse, generalized anxiety disorder, GERD recently had lower extremity edema of unclear etiology and was started on  Lasix following which patient reports not feeling well with generalized weakness.  The swelling has much improved today after Lasix but on arrival to ED he was found to have's significantly low potassium of 2.  He was admitted to [facility] for further evaluation and management of severe hypokalemia and lower extremity edema. Assessment & Plan:   Principal Problem:   Hypokalemia Active Problems:   GAD (generalized anxiety disorder)   Essential hypertension, benign   Gastroesophageal reflux disease without esophagitis   Human immunodeficiency virus (HIV) disease (HCC)    HIV Non compliant to meds.  Resume home meds.  Recommend outpatient follo wup with ID.   Hypokalemia:  Replaced and repeat in am.    Hypertension:  Restarted coreg  GERD. Stable.    Hypomagnesemia:  Replaced.    Anemia of chronic disease/ anemia from folate deficiency.  Last hemoglobin from 09/2019 is around 15, dropped to 9.1 to 8.9. Anemia panel ordered. Low folate levels.  Get stool for occult blood.    H/o alcohol abuse:  No withdrawal symptoms.  Continue with CIWA.    Generalized anxiety disorder:  Restart sertraline.    Lower extremity edema.  Evaluate for CHF.  Echocardiogram ordered.    Hyponatremia:  From diuresis.  Improving.    Elevated troponins:  Pt denies any chest pain or sob.  ? From severe hypokalemia and demand ischemia.  ekg shows sinus tachycardia with  t wave inversions in the lateral leads. Echocardiogram ordered for further evaluation.     DVT prophylaxis:  SCD's Code Status: Full code.  Family Communication: none at bedside.  Disposition:   Status is: Inpatient  Remains inpatient appropriate because:Ongoing diagnostic testing needed not appropriate for outpatient work up and IV treatments appropriate due to intensity of illness or inability to take PO   Dispo: The patient is from: Home              Anticipated d/c is to: Home              Patient currently is not medically stable to d/c.   Difficult to place patient No       Level of care: Telemetry Consultants:   None.   Procedures:echocardiogram.  Antimicrobials: none.   Subjective: No chest pain or sob, persistent lower extremity edema.   Objective: Vitals:   08/04/20 0530 08/04/20 0600 08/04/20 0726 08/04/20 0731  BP: 119/83 120/77 135/77 135/77  Pulse: (!) 111 (!) 103 (!) 51 (!) 51  Resp: 14 16    Temp:      TempSrc:      SpO2: 94% 95%      Intake/Output Summary (Last 24 hours) at 08/04/2020 0816 Last data filed at 08/04/2020 0414 Gross per 24 hour  Intake 1495.67 ml  Output -  Net 1495.67 ml   There were no vitals filed for this visit.  Examination:  General exam: Appears calm and comfortable  Respiratory system: Clear to auscultation. Respiratory effort normal. Cardiovascular system: S1 & S2 heard, RRR. No JVD, . No pedal edema. Gastrointestinal system: Abdomen is nondistended, soft and nontender.  Normal  bowel sounds heard. Central nervous system: Alert and oriented. No focal neurological deficits. Extremities: Symmetric 5 x 5 power. Skin: No rashes, lesions or ulcers Psychiatry: Mood & affect appropriate.     Data Reviewed: I have personally reviewed following labs and imaging studies  CBC: Recent Labs  Lab 08/03/20 2032 08/04/20 0350  WBC 6.4 6.0  NEUTROABS 4.3  --   HGB 9.1* 8.9*  HCT 25.3* 25.1*  MCV 91.0 91.9  PLT 452* 427*    Basic Metabolic Panel: Recent Labs  Lab 08/03/20 2032 08/03/20 2208 08/04/20 0102 08/04/20 0350   NA 132*  --  131* 132*  K 2.0*  --  3.1* 3.3*  CL 79*  --  82* 86*  CO2 30  --  34* 33*  GLUCOSE 93  --  101* 78  BUN 36*  --  38* 35*  CREATININE 1.05  --  1.21 1.06  CALCIUM 7.3*  --  6.9* 6.7*  MG  --  1.4* 1.5*  --   PHOS  --  4.5 4.5  --     GFR: CrCl cannot be calculated (Unknown ideal weight.).  Liver Function Tests: Recent Labs  Lab 08/03/20 2032 08/04/20 0102 08/04/20 0350  AST 51* 58* 57*  ALT 19 18 18   ALKPHOS 86 78 77  BILITOT 1.9* 1.9* 2.0*  PROT 6.0* 5.2* 5.3*  ALBUMIN 2.3* 2.1* 2.1*    CBG: No results for input(s): GLUCAP in the last 168 hours.   Recent Results (from the past 240 hour(s))  SARS CORONAVIRUS 2 (TAT 6-24 HRS) Nasopharyngeal Nasopharyngeal Swab     Status: None   Collection Time: 08/03/20 10:00 PM   Specimen: Nasopharyngeal Swab  Result Value Ref Range Status   SARS Coronavirus 2 NEGATIVE NEGATIVE Final    Comment: (NOTE) SARS-CoV-2 target nucleic acids are NOT DETECTED.  The SARS-CoV-2 RNA is generally detectable in upper and lower respiratory specimens during the acute phase of infection. Negative results do not preclude SARS-CoV-2 infection, do not rule out co-infections with other pathogens, and should not be used as the sole basis for treatment or other patient management decisions. Negative results must be combined with clinical observations, patient history, and epidemiological information. The expected result is Negative.  Fact Sheet for Patients: 08/05/20  Fact Sheet for Healthcare Providers: HairSlick.no  This test is not yet approved or cleared by the [place] FDA and  has been authorized for detection and/or diagnosis of SARS-CoV-2 by FDA under an Emergency Use Authorization (EUA). This EUA will remain  in effect (meaning this test can be used) for the duration of the COVID-19 declaration under Se ction 564(b)(1) of the Act, 21 U.S.C. section  360bbb-3(b)(1), unless the authorization is terminated or revoked sooner.  Performed at [facility], [address], [place], [place]          Radiology Studies: DG Chest [place] 1 View  Result Date: 08/03/2020 CLINICAL DATA:  Weakness, anorexia, HIV positive EXAM: PORTABLE CHEST 1 VIEW COMPARISON:  07/31/2017 FINDINGS: The heart size and mediastinal contours are within normal limits. Both lungs are clear. The visualized skeletal structures are unremarkable. IMPRESSION: No active disease. Electronically Signed   By: 08/02/2017 M.D.   On: 08/03/2020 20:27        Scheduled Meds: . folic acid  1 mg Oral Daily  . multivitamin with minerals  1 tablet Oral Daily  . potassium chloride  40 mEq Oral Once  . thiamine  100 mg  Oral Daily   Or  . thiamine  100 mg Intravenous Daily   Continuous Infusions: . magnesium sulfate bolus IVPB       LOS: 1 day        Kathlen Mody, MD [facility]   To contact the attending provider between 7A-7P or the covering provider during after hours 7P-7A, please log into the web site [email or web address] and access using universal [place] password for that web site. If you do not have the password, please call the hospital operator.  08/04/2020, 8:16 AM

## 2020-08-04 NOTE — [place] Notes (Signed)
  Echocardiogram 2D Echocardiogram has been performed.  Harold Flores 08/04/2020, 1:44 PM

## 2020-08-05 ENCOUNTER — Inpatient Hospital Stay (HOSPITAL_COMMUNITY): Payer: Medicaid Other

## 2020-08-05 LAB — HIV-1 RNA QUANT-NO REFLEX-BLD
HIV 1 RNA Quant: 104000 copies/mL
LOG10 HIV-1 RNA: 5.017 log10copy/mL

## 2020-08-05 LAB — BASIC METABOLIC PANEL
Anion gap: 15 (ref 5–15)
Anion gap: 13 (ref 5–15)
BUN: 25 mg/dL — ABNORMAL HIGH (ref 6–20)
BUN: 19 mg/dL (ref 6–20)
CO2: 33 mmol/L — ABNORMAL HIGH (ref 22–32)
CO2: 35 mmol/L — ABNORMAL HIGH (ref 22–32)
Calcium: 8 mg/dL — ABNORMAL LOW (ref 8.9–10.3)
Calcium: 8.2 mg/dL — ABNORMAL LOW (ref 8.9–10.3)
Chloride: 87 mmol/L — ABNORMAL LOW (ref 98–111)
Chloride: 89 mmol/L — ABNORMAL LOW (ref 98–111)
Creatinine, Ser: 0.85 mg/dL (ref 0.61–1.24)
Creatinine, Ser: 0.87 mg/dL (ref 0.61–1.24)
GFR, Estimated: >60 mL/min (ref >60)
GFR, Estimated: >60 mL/min (ref >60)
Glucose, Bld: 86 mg/dL (ref 70–99)
Glucose, Bld: 91 mg/dL (ref 70–99)
Potassium: 2.5 mmol/L — CL (ref 3.5–5.1)
Potassium: 2.8 mmol/L — ABNORMAL LOW (ref 3.5–5.1)
Sodium: 135 mmol/L (ref 135–145)
Sodium: 137 mmol/L (ref 135–145)

## 2020-08-05 LAB — CBC
HCT: 24.2 % — ABNORMAL LOW (ref 39.0–52.0)
Hemoglobin: 8.6 g/dL — ABNORMAL LOW (ref 13.0–17.0)
MCH: 32.8 pg (ref 26.0–34.0)
MCHC: 35.5 g/dL (ref 30.0–36.0)
MCV: 92.4 fL (ref 80.0–100.0)
Platelets: 388 10*3/uL (ref 150–400)
RBC: 2.62 MIL/uL — ABNORMAL LOW (ref 4.22–5.81)
RDW: 18.6 % — ABNORMAL HIGH (ref 11.5–15.5)
WBC: 6.5 10*3/uL (ref 4.0–10.5)
nRBC: 0.6 % — ABNORMAL HIGH (ref 0.0–0.2)

## 2020-08-05 LAB — MAGNESIUM: Magnesium: 2 mg/dL (ref 1.7–2.4)

## 2020-08-05 LAB — POTASSIUM: Potassium: 2.5 mmol/L — CL (ref 3.5–5.1)

## 2020-08-05 MED ORDER — POTASSIUM CHLORIDE 10 MEQ/100ML IV SOLN
10.0000 meq | INTRAVENOUS | Status: DC
  Filled 2020-08-05: qty 100

## 2020-08-05 MED ORDER — IOHEXOL 350 MG/ML SOLN
100.0000 mL | Freq: Once | INTRAVENOUS | Status: AC | PRN
  Administered 2020-08-05: 100 mL via INTRAVENOUS

## 2020-08-05 MED ORDER — POTASSIUM CHLORIDE CRYS ER 20 MEQ PO TBCR
40.0000 meq | EXTENDED_RELEASE_TABLET | Freq: Three times a day (TID) | ORAL | Status: AC
  Administered 2020-08-05 – 2020-08-06 (×4): 40 meq via ORAL
  Filled 2020-08-05 (×4): qty 2

## 2020-08-05 MED ORDER — POTASSIUM CHLORIDE 10 MEQ/100ML IV SOLN
10.0000 meq | INTRAVENOUS | Status: AC
  Administered 2020-08-05 – 2020-08-06 (×4): 10 meq via INTRAVENOUS
  Filled 2020-08-05 (×3): qty 100

## 2020-08-05 MED ORDER — POTASSIUM CHLORIDE CRYS ER 20 MEQ PO TBCR
40.0000 meq | EXTENDED_RELEASE_TABLET | Freq: Two times a day (BID) | ORAL | Status: DC
  Administered 2020-08-05: 40 meq via ORAL
  Filled 2020-08-05: qty 2

## 2020-08-05 MED ORDER — POTASSIUM CHLORIDE 10 MEQ/100ML IV SOLN
10.0000 meq | INTRAVENOUS | Status: AC
  Administered 2020-08-05 (×4): 10 meq via INTRAVENOUS
  Filled 2020-08-05 (×4): qty 100

## 2020-08-05 NOTE — [place] Notes (Signed)
PROGRESS NOTE    Harold HOOGLAND  GDJ:[id number] DOB: 1990-06-18 DOA: 08/03/2020 PCP: Etta Grandchild, MD    Chief Complaint  Patient presents with  . Fatigue    Brief Narrative:   30 year old gentleman prior history of HIV, noncompliant to medications, essential hypertension, alcohol abuse, generalized anxiety disorder, GERD recently had lower extremity edema of unclear etiology and was started on  Lasix following which patient reports not feeling well with generalized weakness.  The swelling has much improved today after Lasix but on arrival to ED he was found to have's significantly low potassium of 2.  He was admitted to [facility] for further evaluation and management of severe hypokalemia and lower extremity edema.  Pt seen and examined at bedside. Reports feeling weak. No chest pain or sob.  Assessment & Plan:   Principal Problem:   Hypokalemia Active Problems:   GAD (generalized anxiety disorder)   Essential hypertension, benign   Gastroesophageal reflux disease without esophagitis   Human immunodeficiency virus (HIV) disease (HCC)    HIV Non compliant to meds.  Resume home meds.  Recommend outpatient follo wup with ID.   Severe Hypokalemia:  Replaced and repeat in am.    Hypertension:  Restarted coreg, optimal BP.  GERD. Stable.    Hypomagnesemia:  Replaced.    Anemia of chronic disease/ anemia from folate deficiency.  Last hemoglobin from 09/2019 is around 15, dropped to 9.1 to 8.9. Anemia panel ordered. Low folate levels.  Get stool for occult blood.    H/o alcohol abuse:  No withdrawal symptoms.  Continue with CIWA.    Generalized anxiety disorder:  Restart sertraline.    Lower extremity edema.  RESOLVED.    Hyponatremia:  From diuresis.  Improving.    Elevated troponins:  Pt denies any chest pain or sob.  ? From severe hypokalemia and demand ischemia.  ekg shows sinus tachycardia with  t wave inversions in the lateral  leads. Echocardiogram ordered for further evaluation.   Echocardiogram no significant abnormalities.  CT angio of the chest negative for PE.    DVT prophylaxis: SCD's Code Status: Full code.  Family Communication: none at bedside.  Disposition:   Status is: Inpatient  Remains inpatient appropriate because:Ongoing diagnostic testing needed not appropriate for outpatient work up and IV treatments appropriate due to intensity of illness or inability to take PO   Dispo: The patient is from: Home              Anticipated d/c is to: Home              Patient currently is not medically stable to d/c.   Difficult to place patient No       Level of care: Telemetry Consultants:   None.   Procedures:echocardiogram.  Antimicrobials: none.   Subjective: No chest pain or sob, no nausea, vomiting or abd pain or diarrhea.   Objective: Vitals:   08/04/20 1308 08/04/20 2027 08/05/20 0513 08/05/20 1242  BP: 106/75 119/67 121/75 125/72  Pulse: 98 95 100 97  Resp: 16 16 18 18   Temp: 97.9 F (36.6 C) 97.8 F (36.6 C) 98 F (36.7 C) 98 F (36.7 C)  TempSrc: Oral Oral Oral Oral  SpO2: 97% (!) 88% 100% 100%  Weight:      Height:        Intake/Output Summary (Last 24 hours) at 08/05/2020 1641 Last data filed at 08/05/2020 0521 Gross per 24 hour  Intake --  Output 550 ml  Net -  550 ml   Filed Weights   08/04/20 0830  Weight: 82.8 kg    Examination:  General exam: alert and comfortable. Not in distress.  Respiratory system: clear to auscultation, no wheezing heard.  Cardiovascular system: S1S2 RRR no JVD, no pedal edema.  Gastrointestinal system: Abdomen is soft, non tender non distended, bowel sounds wnl.  Central nervous system: alert and oriented non focal Extremities: no pedal edema.  Skin: No rashes  Psychiatry: mood is appropriate.     Data Reviewed: I have personally reviewed following labs and imaging studies  CBC: Recent Labs  Lab 08/03/20 2032  08/04/20 0350 08/05/20 0433  WBC 6.4 6.0 6.5  NEUTROABS 4.3  --   --   HGB 9.1* 8.9* 8.6*  HCT 25.3* 25.1* 24.2*  MCV 91.0 91.9 92.4  PLT 452* 427* 388    Basic Metabolic Panel: Recent Labs  Lab 08/03/20 2032 08/03/20 2208 08/04/20 0102 08/04/20 0350 08/05/20 0433 08/05/20 1212  NA 132*  --  131* 132* 135  --   K 2.0*  --  3.1* 3.3* 2.5* 2.5*  CL 79*  --  82* 86* 87*  --   CO2 30  --  34* 33* 33*  --   GLUCOSE 93  --  101* 78 86  --   BUN 36*  --  38* 35* 25*  --   CREATININE 1.05  --  1.21 1.06 0.85  --   CALCIUM 7.3*  --  6.9* 6.7* 8.0*  --   MG  --  1.4* 1.5*  --  2.0  --   PHOS  --  4.5 4.5  --   --   --     GFR: Estimated Creatinine Clearance: 132.4 mL/min (by C-G formula based on SCr of 0.85 mg/dL).  Liver Function Tests: Recent Labs  Lab 08/03/20 2032 08/04/20 0102 08/04/20 0350  AST 51* 58* 57*  ALT 19 18 18   ALKPHOS 86 78 77  BILITOT 1.9* 1.9* 2.0*  PROT 6.0* 5.2* 5.3*  ALBUMIN 2.3* 2.1* 2.1*    CBG: No results for input(s): GLUCAP in the last 168 hours.   Recent Results (from the past 240 hour(s))  SARS CORONAVIRUS 2 (TAT 6-24 HRS) Nasopharyngeal Nasopharyngeal Swab     Status: None   Collection Time: 08/03/20 10:00 PM   Specimen: Nasopharyngeal Swab  Result Value Ref Range Status   SARS Coronavirus 2 NEGATIVE NEGATIVE Final    Comment: (NOTE) SARS-CoV-2 target nucleic acids are NOT DETECTED.  The SARS-CoV-2 RNA is generally detectable in upper and lower respiratory specimens during the acute phase of infection. Negative results do not preclude SARS-CoV-2 infection, do not rule out co-infections with other pathogens, and should not be used as the sole basis for treatment or other patient management decisions. Negative results must be combined with clinical observations, patient history, and epidemiological information. The expected result is Negative.  Fact Sheet for Patients: HairSlick.nohttps://www.fda.gov/media/138098/download  Fact Sheet for  Healthcare Providers: quierodirigir.comhttps://www.fda.gov/media/138095/download  This test is not yet approved or cleared by the [place] FDA and  has been authorized for detection and/or diagnosis of SARS-CoV-2 by FDA under an Emergency Use Authorization (EUA). This EUA will remain  in effect (meaning this test can be used) for the duration of the COVID-19 declaration under Se ction 564(b)(1) of the Act, 21 U.S.C. section 360bbb-3(b)(1), unless the authorization is terminated or revoked sooner.  Performed at [facility], [address] [place], [place] [address]  Radiology Studies: CT ANGIO CHEST PE W OR WO CONTRAST  Result Date: 08/05/2020 CLINICAL DATA:  Tech cardia, elevated troponins, lower extremity edema PE suspected. EXAM: CT ANGIOGRAPHY CHEST WITH CONTRAST TECHNIQUE: Multidetector CT imaging of the chest was performed using the standard protocol during bolus administration of intravenous contrast. Multiplanar CT image reconstructions and MIPs were obtained to evaluate the vascular anatomy. CONTRAST:  OMNIPAQUE IOHEXOL 350 MG/ML SOLN COMPARISON:  Chest radiograph August 03, 2020. FINDINGS: Cardiovascular: Satisfactory opacification of the pulmonary arteries to the segmental level. No evidence of pulmonary embolism. Normal heart size. No pericardial effusion. Mediastinum/Nodes: No mediastinal, hilar or axillary lymphadenopathy. Thyroid glands unremarkable. Esophagus and trachea are unremarkable. Lungs/Pleura: There are a few ground-glass nodular opacities in the right upper lobe for instance on image 50/6 and 48/6. No lobar consolidations. No pleural effusion. No pneumothorax. Upper Abdomen: No acute abnormality. Musculoskeletal: No chest wall abnormality. No acute or significant osseous findings. Review of the MIP images confirms the above findings. IMPRESSION: 1. No evidence of acute pulmonary embolism. 2. Few ground-glass nodular opacities in the right upper lobe, which  may reflect an infectious/inflammatory process. Electronically Signed   By: Maudry Mayhew MD   On: 08/05/2020 11:00   DG Chest Port 1 View  Result Date: 08/03/2020 CLINICAL DATA:  Weakness, anorexia, HIV positive EXAM: PORTABLE CHEST 1 VIEW COMPARISON:  07/31/2017 FINDINGS: The heart size and mediastinal contours are within normal limits. Both lungs are clear. The visualized skeletal structures are unremarkable. IMPRESSION: No active disease. Electronically Signed   By: Sharlet Salina M.D.   On: 08/03/2020 20:27   ECHOCARDIOGRAM COMPLETE  Result Date: 08/04/2020    ECHOCARDIOGRAM REPORT   Patient Name:   BRYNDAN BILYK Date of Exam: 08/04/2020 Medical Rec #:  [id number]          Height:       70.0 in Accession #:    [id number]         Weight:       182.5 lb Date of Birth:  May 29, 1991           BSA:          2.008 m Patient Age:    30 years           BP:           106/75 mmHg Patient Gender: M                  HR:           94 bpm. Exam Location:  Inpatient Procedure: 2D Echo, Cardiac Doppler and Color Doppler Indications:    Bilateral Lower Extremity Edema [id number].  History:        Patient has no prior history of Echocardiogram examinations.                 Risk Factors:Hypertension. HIV. GERD.  Sonographer:    Elmarie Shiley Dance Referring Phys: Nilda Calamity Tykesha Konicki IMPRESSIONS  1. Left ventricular ejection fraction, by estimation, is 60 to 65%. The left ventricle has normal function. The left ventricle has no regional wall motion abnormalities. There is mild left ventricular hypertrophy. Left ventricular diastolic parameters were normal.  2. Right ventricular systolic function is normal. The right ventricular size is normal.  3. Left atrial size was mildly dilated.  4. The mitral valve is normal in structure. No evidence of mitral valve regurgitation. No evidence of mitral stenosis.  5. The aortic valve is normal in structure. Aortic valve regurgitation  is not visualized. No aortic stenosis is present.  6. Aortic  dilatation noted. There is mild dilatation at the level of the sinuses of Valsalva, measuring 40 mm.  7. The inferior vena cava is normal in size with greater than 50% respiratory variability, suggesting right atrial pressure of 3 mmHg. FINDINGS  Left Ventricle: Left ventricular ejection fraction, by estimation, is 60 to 65%. The left ventricle has normal function. The left ventricle has no regional wall motion abnormalities. The left ventricular internal cavity size was normal in size. There is  mild left ventricular hypertrophy. Left ventricular diastolic parameters were normal. Right Ventricle: The right ventricular size is normal. No increase in right ventricular wall thickness. Right ventricular systolic function is normal. Left Atrium: Left atrial size was mildly dilated. Right Atrium: Right atrial size was normal in size. Pericardium: There is no evidence of pericardial effusion. Mitral Valve: The mitral valve is normal in structure. No evidence of mitral valve regurgitation. No evidence of mitral valve stenosis. Tricuspid Valve: The tricuspid valve is normal in structure. Tricuspid valve regurgitation is not demonstrated. No evidence of tricuspid stenosis. Aortic Valve: The aortic valve is normal in structure. Aortic valve regurgitation is not visualized. No aortic stenosis is present. Pulmonic Valve: The pulmonic valve was normal in structure. Pulmonic valve regurgitation is not visualized. No evidence of pulmonic stenosis. Aorta: Aortic dilatation noted. There is mild dilatation at the level of the sinuses of Valsalva, measuring 40 mm. Venous: The inferior vena cava is normal in size with greater than 50% respiratory variability, suggesting right atrial pressure of 3 mmHg. IAS/Shunts: No atrial level shunt detected by color flow Doppler.  LEFT VENTRICLE PLAX 2D LVIDd:         5.00 cm  Diastology LVIDs:         3.30 cm  LV e' medial:    9.68 cm/s LV PW:         1.30 cm  LV E/e' medial:  8.8 LV IVS:         0.90 cm  LV e' lateral:   12.40 cm/s LVOT diam:     2.40 cm  LV E/e' lateral: 6.9 LV SV:         98 LV SV Index:   49 LVOT Area:     4.52 cm  RIGHT VENTRICLE             IVC RV Basal diam:  3.00 cm     IVC diam: 1.60 cm RV Mid diam:    2.40 cm RV S prime:     12.90 cm/s TAPSE (M-mode): 2.8 cm LEFT ATRIUM             Index       RIGHT ATRIUM           Index LA diam:        4.20 cm 2.09 cm/m  RA Area:     18.10 cm LA Vol (A2C):   88.5 ml 44.08 ml/m RA Volume:   51.70 ml  25.75 ml/m LA Vol (A4C):   78.0 ml 38.85 ml/m LA Biplane Vol: 86.3 ml 42.98 ml/m  AORTIC VALVE LVOT Vmax:   109.00 cm/s LVOT Vmean:  78.400 cm/s LVOT VTI:    0.217 m  AORTA Ao Root diam: 4.00 cm Ao Asc diam:  3.20 cm MITRAL VALVE MV Area (PHT): 2.71 cm    SHUNTS MV Decel Time: 280 msec    Systemic VTI:  0.22 m MV E velocity: 85.60 cm/s  Systemic Diam: 2.40 cm Donato Schultz MD Electronically signed by Donato Schultz MD Signature Date/Time: 08/04/2020/3:13:38 PM    Final         Scheduled Meds: . abacavir-dolutegravir-lamiVUDine  1 tablet Oral Daily  . carvedilol  6.25 mg Oral BID WC  . folic acid  1 mg Oral Daily  . multivitamin with minerals  1 tablet Oral Daily  . potassium chloride  40 mEq Oral TID  . sertraline  100 mg Oral Daily  . thiamine  100 mg Oral Daily   Or  . thiamine  100 mg Intravenous Daily   Continuous Infusions: . potassium chloride       LOS: 2 days        Kathlen Mody, MD [facility]   To contact the attending provider between 7A-7P or the covering provider during after hours 7P-7A, please log into the web site [email or web address] and access using universal [place] password for that web site. If you do not have the password, please call the hospital operator.  08/05/2020, 4:41 PM

## 2020-08-05 NOTE — [place] Notes (Signed)
Date and time results received: 08/05/20 0630  Test: K+ Critical Value: 2.5  Name of Provider Notified: Blout MD  MD aware. Awaiting orders.

## 2020-08-06 ENCOUNTER — Inpatient Hospital Stay (HOSPITAL_COMMUNITY): Payer: Medicaid Other

## 2020-08-06 DIAGNOSIS — F411 Generalized anxiety disorder: Secondary | ICD-10-CM

## 2020-08-06 DIAGNOSIS — G9341 Metabolic encephalopathy: Principal | ICD-10-CM

## 2020-08-06 DIAGNOSIS — E876 Hypokalemia: Secondary | ICD-10-CM

## 2020-08-06 LAB — HEPATIC FUNCTION PANEL
ALT: 22 U/L (ref 0–44)
AST: 61 U/L — ABNORMAL HIGH (ref 15–41)
Albumin: 2.5 g/dL — ABNORMAL LOW (ref 3.5–5.0)
Alkaline Phosphatase: 82 U/L (ref 38–126)
Bilirubin, Direct: 0.3 mg/dL — ABNORMAL HIGH (ref 0.0–0.2)
Indirect Bilirubin: 0.9 mg/dL (ref 0.3–0.9)
Total Bilirubin: 1.2 mg/dL (ref 0.3–1.2)
Total Protein: 5.9 g/dL — ABNORMAL LOW (ref 6.5–8.1)

## 2020-08-06 LAB — BASIC METABOLIC PANEL
Anion gap: 11 (ref 5–15)
BUN: 15 mg/dL (ref 6–20)
CO2: 34 mmol/L — ABNORMAL HIGH (ref 22–32)
Calcium: 8.3 mg/dL — ABNORMAL LOW (ref 8.9–10.3)
Chloride: 91 mmol/L — ABNORMAL LOW (ref 98–111)
Creatinine, Ser: 0.75 mg/dL (ref 0.61–1.24)
GFR, Estimated: >60 mL/min (ref >60)
Glucose, Bld: 100 mg/dL — ABNORMAL HIGH (ref 70–99)
Potassium: 3.1 mmol/L — ABNORMAL LOW (ref 3.5–5.1)
Sodium: 136 mmol/L (ref 135–145)

## 2020-08-06 LAB — HEMOGLOBIN AND HEMATOCRIT, BLOOD
HCT: 23 % — ABNORMAL LOW (ref 39.0–52.0)
Hemoglobin: 7.9 g/dL — ABNORMAL LOW (ref 13.0–17.0)

## 2020-08-06 LAB — AMMONIA: Ammonia: 12 umol/L (ref 9–35)

## 2020-08-06 LAB — TSH: TSH: 3.383 u[IU]/mL (ref 0.350–4.500)

## 2020-08-06 MED ORDER — LOPERAMIDE HCL 2 MG PO CAPS
2.0000 mg | ORAL_CAPSULE | ORAL | Status: DC | PRN
  Administered 2020-08-06 – 2020-08-10 (×3): 2 mg via ORAL
  Filled 2020-08-06 (×3): qty 1

## 2020-08-06 MED ORDER — LORAZEPAM 2 MG/ML IJ SOLN
1.0000 mg | Freq: Once | INTRAMUSCULAR | Status: AC
  Administered 2020-08-06: 1 mg via INTRAVENOUS
  Filled 2020-08-06: qty 1

## 2020-08-06 NOTE — [place] Notes (Signed)
Delay in patient care. Patient unable to safely screened for MRI due to change in mental status. Unable to reach patient's father Celedonio Miyamoto) at [phone].

## 2020-08-06 NOTE — Evaluation (Addendum)
Physical Therapy Evaluation Patient Details Name: Harold Flores MRN: [id number] DOB: 23-Jul-1990 Today's Date: 08/06/2020   History of Present Illness  30 year old gentleman prior history of HIV, noncompliant to medications, essential hypertension, alcohol abuse, generalized anxiety disorder, GERD recently had lower extremity edema of unclear etiology and was started on  Lasix, Admitted 08/03/20 with progressive weakness , significantly low potassium of 2.  Clinical Impression  The patient presents with significant/profound   Motor and sensory deficits of both LE's, absent bilateral  Dorsiflex, almost absent sensation bilateral lower legs. Patient required 2 person max/total  assist to stand at [facility] lift equipment  which blocked both knees. Patient currently does not have strength to safely stand at a RW. Patient most likely will require a WC for mobility at this time. Patient's spouse present  To provide information as patient  Has impaired cognition, STM issues. Patient not oriented to place and time. Per spouse, patient has not been ambulatory for ~ 2 weeks, Has not been able to work for ~ 1.5 months. Patient has been being rolled on a rollator for mobility recently in the home.  Patient has had several falls PTA.   Pt admitted with above diagnosis.  Pt currently with functional limitations due to the deficits listed below (see PT Problem List). Pt will benefit from skilled PT to increase their independence and safety with mobility to allow discharge to the venue listed below.   Noted MD note from 07/20/20 indicates neurological issues involving both legs during an office visit, recommending a neuro consult.    Follow Up Recommendations CIR    Equipment Recommendations  Wheelchair cushion (measurements PT);Wheelchair (measurements PT)    Recommendations for Other Services Rehab consult     Precautions / Restrictions Precautions Precautions: Fall Precaution Comments: has had  several  falls Restrictions Weight Bearing Restrictions: No      Mobility  Bed Mobility Overal bed mobility: Needs Assistance Bed Mobility: Supine to Sit;Sit to Supine     Supine to sit: Mod assist Sit to supine: Mod assist   General bed mobility comments: assist with legs to bed edge and back onto bed, patient able to push self to sitting    Transfers Overall transfer level: Needs assistance   Transfers: Sit to/from Stand Sit to Stand: Total assist;+2 physical assistance;+2 safety/equipment;From elevated surface         General transfer comment: total assist to power up to stnad from bed x 2 trials. Patient reliant heavily on UE support. Decreased descent control.  Ambulation/Gait             General Gait Details: unable  Stairs            Wheelchair Mobility    Modified Rankin (Stroke Patients Only)       Balance Overall balance assessment: Needs assistance;History of Falls Sitting-balance support: Feet supported;Bilateral upper extremity supported Sitting balance-Leahy Scale: Fair Sitting balance - Comments: responds to challenges in all planes, tends to lose control laterally but did not fall to the side   Standing balance support: Bilateral upper extremity supported;During functional activity Standing balance-Leahy Scale: Poor Standing balance comment: reliant on STEDY bar and knees blocked, max UE support                             Pertinent Vitals/Pain Pain Assessment: Faces Faces Pain Scale: Hurts whole lot Pain Location: gluteal folds with excoriation Pain Descriptors / Indicators: Burning;Discomfort Pain Intervention(s): Monitored  during session    Home Living Family/patient expects to be discharged to:: Private residence Living Arrangements: Spouse/significant other;Parent Available Help at Discharge: Family;Available PRN/intermittently Type of Home: House Home Access: Ramped entrance     Home Layout: One level Home  Equipment: Walker - 4 wheels;Cane - single point      Prior Function Level of Independence: Needs assistance   Gait / Transfers Assistance Needed: has  not ambulated in ~ 2weeks per spouse, has been rolling on 4 wheeled  Rw.  ADL's / Homemaking Assistance Needed: requires assistance with trnasfers.        Hand Dominance        Extremity/Trunk Assessment        Lower Extremity Assessment Lower Extremity Assessment: RLE deficits/detail;LLE deficits/detail RLE Deficits / Details: LT impaired distal Leg, deep pressure sensed but not specific to location, more sensation proximally, Dorsiflexion 0/5 , knee extension 2/5, hip flex 2/5, Abd . 2/5 LLE Deficits / Details: very similar to the right, decreased senastion, 0 dorsiflexion    Cervical / Trunk Assessment Cervical / Trunk Assessment: Other exceptions Cervical / Trunk Exceptions: decreased core strength, does tilt when challenged but catches self, trunk appears to be with increased slumping, not sitting erect.  Communication      Cognition Arousal/Alertness: Awake/alert Behavior During Therapy: WFL for tasks assessed/performed Overall Cognitive Status: Impaired/Different from baseline Area of Impairment: Orientation;Memory;Following commands;Awareness;Safety/judgement                 Orientation Level: Place;Time;Situation   Memory: Decreased short-term memory Following Commands: Follows one step commands inconsistently Safety/Judgement: Decreased awareness of deficits Awareness: Emergent   General Comments: per spouse, noted decline in cognitive function recently, ? hallucinations.      General Comments      Exercises     Assessment/Plan    PT Assessment Patient needs continued PT services  PT Problem List Decreased strength;Decreased mobility;Decreased safety awareness;Decreased knowledge of precautions;Decreased activity tolerance;Decreased cognition;Pain;Decreased balance;Decreased knowledge of use of  DME;Impaired sensation       PT Treatment Interventions DME instruction;Therapeutic activities;Cognitive remediation;Therapeutic exercise;Patient/family education;Balance training;Wheelchair mobility training;Functional mobility training;Neuromuscular re-education    PT Goals (Current goals can be found in the Care Plan section)  Acute Rehab PT Goals Patient Stated Goal: to get better PT Goal Formulation: With patient/family Time For Goal Achievement: 08/20/20 Potential to Achieve Goals: Fair    Frequency Min 2X/week   Barriers to discharge Decreased caregiver support      Co-evaluation PT/OT/SLP Co-Evaluation/Treatment: Yes Reason for Co-Treatment: Complexity of the patient's impairments (multi-system involvement);For patient/therapist safety;To address functional/ADL transfers PT goals addressed during session: Mobility/safety with mobility OT goals addressed during session: ADL's and self-care       AM-PAC PT "6 Clicks" Mobility  Outcome Measure Help needed turning from your back to your side while in a flat bed without using bedrails?: A Lot Help needed moving from lying on your back to sitting on the side of a flat bed without using bedrails?: A Lot Help needed moving to and from a bed to a chair (including a wheelchair)?: Total Help needed standing up from a chair using your arms (e.g., wheelchair or bedside chair)?: Total Help needed to walk in hospital room?: Total Help needed climbing 3-5 steps with a railing? : Total 6 Click Score: 8    End of Session Equipment Utilized During Treatment: Gait belt Activity Tolerance: Patient limited by fatigue Patient left: in bed;with call bell/phone within reach;with bed alarm set;with family/visitor present Nurse  Communication: Mobility status;Need for lift equipment PT Visit Diagnosis: Muscle weakness (generalized) (M62.81);Other symptoms and signs involving the nervous system (R29.898);Repeated falls (R29.6)    Time:  9163-8466 PT Time Calculation (min) (ACUTE ONLY): 39 min   Charges:   PT Evaluation $PT Eval Moderate Complexity: 1 Mod          Blanchard Kelch PT Acute Rehabilitation Services Pager [phone] Office [phone]   Rada Hay 08/06/2020, 12:37 PM

## 2020-08-06 NOTE — Progress Notes (Signed)
Chart reviewed. 29 yo patient history of HIV, HTN, EtoH abuse, asthma, poor compliance admitted with severe lower extremity edema, hypokalemia. Cardiology had been notified about an elevated troponin as part of his workup as well as abnormal EKG   EKG SR, LVH with probable strain pattern with lateral TWIs Trop 339-->308--> Echo LVEF 60-65%, no WMAs, LVH   Mild troponin elevation in setting of hypotension on initial presentation, LVH, severe hypokalemia, tachycardia, anemia (15-->8.9 over the last year) in absence of cardiopulmonary symptoms. EKG changes would be consistent with LVH and strain pattern. Do not see any indication for additional cardiac testing at this time. EtOH abuse and historical poor compliance would be an issue as well to ever consider additional cardiac testing.    Dina Rich MD

## 2020-08-06 NOTE — Progress Notes (Signed)
RN messaged MD about concern of patients in and out of confusion. Will continue to assess.

## 2020-08-06 NOTE — Consult Note (Signed)
Neurology Consultation Reason for Consult: bilateral lower extremity weakness and AMS Requesting Physician: Kathlen Mody  CC: LE weakness   History is obtained from: Chart review and primary team, family not reachable overnight and patient unreliable   HPI: Harold Flores is a 30 y.o. male with a past medical history significant for HIV/AIDS (CD4 count 40 this admission)  He was last seen by infectious disease 09/08/2018, since which time he has been struggling with access to care issues (affordability of [employer], healthcare coverage).  He presented to his primary care physician on 07/20/2020, at which time he was not willing to go to urgent care or the ED and was reporting gradually worsening bilateral lower extremity edema, gait difficulty, paresthesias, hypertension, and tachycardia.  At that time he was not having falls, injury or back pain, or other constitutional symptoms.  He was planned for chest x-ray, echo, labs and cardiology and neurology referrals, and started on Lasix with an understanding that he may have some difficulty carrying out plan of care due to insurance issues.  While his edema did improve with Lasix, he continued to have worsening weakness for which he presented to the ED.   On presentation to [facility], his bilateral lower extremity weakness was initially attributed to electrolyte derangements (potassium of 2.0 on 08/03/2020.  He had initially had a creatinine of 1.05 and a BUN of 36, now improved to 0.75 and 15 respectively).  However he has had reduced appetite, loose stools, as well as worsening confusion with hallucinations for which neurology was consulted  Additionally, cardiology was consulted due to his elevated troponin in 300s.  They noted that the patient is not a candidate for further work-up at this time given his medication noncompliance and EKG changes consistent with cardiac strain in the setting of initial hypotension, new anemia, LVH,  tachycardia  Regarding his HIV history, he has been diagnosed since at least 2016 and initially had absolute CD4 count of 230 and 01/2015 up to max of 550 in November 2018.  He appears to have had some issues with compliance in November 2019 as well, with log 10 HIV 1 of 5.02 at that time, improving to 2.5 on 08/25/2018  Regarding his syphilis history, he had a titer of 1:16 on 10/08/2016, which appears to have responded to treatment, with titers of 1:1 - 1:2 since then (most recently 09/09/2019)  Regarding his varicella history, per chart review he is meant to take Valtrex 1000 mg twice daily  ROS:  Unable to obtain due to altered mental status.   Past Medical History:  Diagnosis Date  . Anal fissure   . Anal fistula   . Anal ulcer   . Asthma   . Epistaxis   . GAD (generalized anxiety disorder)   . GERD (gastroesophageal reflux disease)   . Hemorrhoid   . HIV (human immunodeficiency virus infection) (HCC)   . Hypertension   . Syphilis   . Varicella    Past Surgical History:  Procedure Laterality Date  . EYE SURGERY  1994   blephroplasty right eye   Current Outpatient Medications  Medication Instructions  . carvedilol (COREG) 12.5 MG tablet 1 tab by mouth twice per day  . chlorthalidone (HYGROTON) 25 mg, Oral, Daily  . Cholecalciferol (VITAMIN D3) 1.25 MG (50000 UT) CAPS TAKE 1 CAPSULE BY MOUTH ONE TIME PER WEEK  . furosemide (LASIX) 20 MG tablet 1 tab by mouth in the AM, and 1 in the PM for persistent swelling  .  Multiple Vitamin (MULTIVITAMIN) tablet 1 tablet, Oral, Daily  . sertraline (ZOLOFT) 100 mg, Oral, Daily  . TRIUMEQ 600-50-300 MG tablet TAKE 1 TABLET BY MOUTH EVERY DAY  . valACYclovir (VALTREX) 1,000 mg, Oral, 2 times daily  Per chart review he has not been compliant with any of these medications lately, but he was able to tell me he is on carvedilol, chlorthalidone, sertraline   Family History  Problem Relation Age of Onset  . Diabetes Mother   . Hyperlipidemia  Mother   . Diabetes Maternal Grandmother   . Heart disease Paternal Grandfather   . Cancer Neg Hx    Social History:  reports that he has been smoking e-cigarettes and cigarettes. He has a 2.50 pack-year smoking history. He has never used smokeless tobacco. He reports current alcohol use of about 7.0 standard drinks of alcohol per week. He reports current drug use. Drug: Marijuana.  Exam: Current vital signs: BP (!) 142/94 (BP Location: Right Arm)   Pulse 87   Temp 97.8 F (36.6 C) (Oral)   Resp 16   Ht 5\' 10"  (1.778 m)   Wt 82.8 kg   SpO2 100%   BMI 26.19 kg/m  Vital signs in last 24 hours: Temp:  [97.8 F (36.6 C)-98 F (36.7 C)] 97.8 F (36.6 C) (02/27 1417) Pulse Rate:  [87-96] 87 (02/27 1417) Resp:  [16-18] 16 (02/27 1417) BP: (135-142)/(78-94) 142/94 (02/27 1417) SpO2:  [100 %] 100 % (02/27 1417)   Physical Exam  Constitutional: Appears well-developed and well-nourished.  Psych: Affect calm and bemused, unworried Eyes: No scleral injection HENT: No oropharyngeal obstruction.  No obvious oral lesions MSK: no joint deformities.  Cardiovascular: Normal rate and regular rhythm.  Respiratory: Effort normal, non-labored breathing GI: Soft.  No distension. There is no tenderness.  However he is sitting in his stool and has not realized this until I ask about it Skin: Warm dry and intact visible skin  Neuro: Mental Status: Patient is wide awake (though it is 4 AM), alert, believes he is in his home, reports that it is October 2022.  Patient is unable to give much usable history No signs of aphasia but exam is very limited due to poor attention/concentration Cranial Nerves: II: Visual Fields are notable for him reporting incorrect number of fingers in the right upper quadrant fairly consistently. Pupils are equal, round, and reactive to light.  5 mm to 3 mm bilaterally with some hippus III,IV, VI: EOMI with ptosis of the left eye compared to the right which he reports is  chronic due to prior right eye surgery V: Facial sensation is symmetric to light touch VII: Facial movement is symmetric.  VIII: hearing is intact to voice X: Uvula elevates symmetrically XI: Shoulder shrug is symmetric. XII: tongue is midline without atrophy or fasciculations.  Motor: Tone is very low in the bilateral lower extremities. Bulk appears to have some atrophy in the bilateral lower extremities. 5/5 strength was present in the bilateral upper extremities.  In the lower extremities with some coaching hip flexion, knee extension are both 4/5 bilaterally, knee flexion appears to be closer to 3/5 though limited by his mental status, and I was unable to get him to wiggle his toes Sensory: Sensation is symmetric to light touch and temperature in the arms and legs though he does not seem to be reliable in reporting this.  Deep Tendon Reflexes: 2+ and symmetric in the biceps, I cannot obtain brachioradialis bilaterally, 2+ in patellae and Achilles  Plantars: Toes are mute bilaterally Cerebellar: Finger-to-nose is impaired bilaterally and he cannot perform heel-to-shin due to weakness Gait deferred due to weakness   I have reviewed labs in epic and the results pertinent to this consultation are mostly noted in HPI with additional labs below:  Lab Results  Component Value Date   HIV1RNAQUANT 104,000 08/04/2020   Lab Results  Component Value Date   CD4TCELL 13 (L) 08/25/2018   CD4TABS 420 08/25/2018   Calcium was initially 6.9 which corrected to near normal given hypoalbuminemia to 2.1 (corrected calcium 8.4, reference range 8.9-10.3), current calcium is 8.3 which corrects to 9.5 given albumin of 2.5  Mild AST elevation to 61, essentially stable since admission, mild total bilirubin elevation to 1.9 which is resolved to 1.2  Vitamin B12 726 Folate low at 3.4  Hemoglobin was 15 as of 09/09/2019 and now was 9.1 on arrival, downtrending to 7.9 on 2/27   Ref. Range 08/03/2020 21:52   Iron Latest Ref Range: 45 - 182 ug/dL 161  UIBC Latest Units: ug/dL 15  TIBC Latest Ref Range: 250 - 450 ug/dL 096 (L)  Saturation Ratios Latest Ref Range: 17.9 - 39.5 % 90 (H)  Ferritin Latest Ref Range: 24 - 336 ng/mL 824 (H)   I have reviewed the images obtained: CXR clear   CTA PE protocol:  1. No evidence of acute pulmonary embolism. 2. Few ground-glass nodular opacities in the right upper lobe, which may reflect an infectious/inflammatory process  Impression: This is a profoundly immunosuppressed 30 year old man with HIV progression to ARDS in the setting of not being able to access medications, with a subacute decline in his lower extremity strength and possibly cognitive status as well.  Other than delirium, his examination appears to be consistent with distal greater than proximal lower extremity weakness, tough reflexes are largely intact and I suspect there may be some atrophy of his lower extremities.  In this setting I am concerned about a mixed upper and lower motor neuron process.  Differential is very broad but at this time I am most concerned about an opportunistic infection.  Given patient is quite delirious and will have difficulty staying still for an extended MRI exam as well as very sensitivity for an extended examination with contrast due to contrast timing, recommend obtaining MRI L-spine and T-spine first, followed by MRI brain and C-spine with and without contrast.  Ideally, MRI studies would be completed after lumbar puncture but given the trajectory of his decline during hospitalization, and delay in obtaining MRI due to his mental status today, would obtain IR guided lumbar puncture as soon as possible  Recommendations: - KUB to facilitate MRI clearance - MRI L-spine and T-spine w/ and w/o contrast followed by MRI Brain and C-spine w/ and w/o contrast  - ASAP Lumbar puncture with CSF cell counts tubes 1 & 4, glucose, protein, VDRL, cryptococcal antigen, bacterial  culture, fungal culture, mycobacterial culture, JC virus, extra 2-5 cc flash frozen in case additional studies needed, and the following send-outs (orders placed):  Herpes Simplex Virus (HSV) Types 1/2, DNA PCR TEST: 1386  CMV: Cytomegalovirus (CMV), Qualitative, PCR TEST: [id number]  Varicella Zoster Virus (VZV), DNA PCR TEST: 811914  Epstein-Barr Virus (EBV), Qualitative, PCR TEST: [id number]  [email or web address] Varicella-Zoster Virus Antibody, IgG, CSF - Serum studies RPR, serum cryptococcal antigen, CK, CMV quantitative PCR, T-spot - Infectious disease consult in the morning - environmental support for delirium: Lights on during the day, patient up and out of bed as much  as is feasible, OT/PT, quiet dimly lit room at night, reorient patient often, minimize sleep disruptions as much as possible overnight.  As much as possible, reorient patient, and have them engage patient in activities, e.g. playing cards. TV should be off or on neutral background music unless patient engaged and watching. Try to keep interactions with the patient calm and quiet.     Brooke Dare MD-PhD [facility] [phone] Available 7 PM to 7 AM, outside of these hours please call Neurologist on call as listed on Amion.

## 2020-08-06 NOTE — Evaluation (Signed)
Occupational Therapy Evaluation Patient Details Name: Harold Flores MRN: [id number] DOB: 01-22-1991 Today's Date: 08/06/2020    History of Present Illness 30 year old gentleman prior history of HIV, noncompliant to medications, essential hypertension, alcohol abuse, generalized anxiety disorder, GERD recently had lower extremity edema of unclear etiology and was started on  Lasix, Admitted 08/03/20 with progressive weakness , significantly low potassium of 2.   Clinical Impression   [name] is a 30 year old man admitted to hospital with above medical history. On evaluation he presents with weakness in upper and lower extremities - more weakness distally than proximally (wrist and hands weaker than elbows and shoulders) and LE worse than upper extremities. Patient exhibits poor activity tolerance, impaired balance and cognitive deficits. Patient was unable to state he was in the hospital, knows the year and month but not the date or day of the week. When leaving the room the patient asked "can't I go out there with Will to get the flowers?" Patient's husband reporting he had been hallucinating. Patient required total assist of two with stedy to stand at edge of bed - relying heavily on his arm to assist with stand - with lower extremities not able to assist with powering up. ADLs limited to set up with seated position - needing total assist for toileting and lower body dressing. Patient will benefit from skilled OT services while in hospital to improve deficits and learn compensatory strategies as needed in order to improve functional abilities.      Follow Up Recommendations       Equipment Recommendations  3 in 1 bedside commode    Recommendations for Other Services Rehab consult     Precautions / Restrictions Precautions Precautions: Fall Precaution Comments: has had  several falls, distal weakness in arms and legs Restrictions Weight Bearing Restrictions: No       Mobility Bed Mobility Overal bed mobility: Needs Assistance Bed Mobility: Supine to Sit;Sit to Supine     Supine to sit: Mod assist Sit to supine: Mod assist   General bed mobility comments: assist with legs to bed edge and back onto bed, patient able to push self to sitting    Transfers Overall transfer level: Needs assistance   Transfers: Sit to/from Stand Sit to Stand: Total assist;+2 physical assistance;+2 safety/equipment;From elevated surface         General transfer comment: total assist to power up to stand from bed x 2 trials. Patient reliant heavily on UE support. Decreased descent control.    Balance Overall balance assessment: Needs assistance Sitting-balance support: Feet supported;Bilateral upper extremity supported Sitting balance-Leahy Scale: Fair Sitting balance - Comments: responds to challenges in all planes, tends to lose control laterally but did not fall to the side   Standing balance support: Bilateral upper extremity supported;During functional activity Standing balance-Leahy Scale: Poor Standing balance comment: reliant on STEDY bar and knees blocked, max UE support                           ADL either performed or assessed with clinical judgement   ADL Overall ADL's : Needs assistance/impaired Eating/Feeding: Set up   Grooming: Set up   Upper Body Bathing: Set up;Cueing for sequencing   Lower Body Bathing: Moderate assistance;Sitting/lateral leans;Set up   Upper Body Dressing : Set up;Sitting   Lower Body Dressing: Total assistance;Sit to/from stand;+2 for safety/equipment;+2 for physical assistance     Toilet Transfer Details (indicate cue type and  reason): unable to transfer off of bed Toileting- Clothing Manipulation and Hygiene: Total assistance;Sit to/from stand;+2 for physical assistance;+2 for safety/equipment               Vision Patient Visual Report: No change from baseline       Perception     Praxis       Pertinent Vitals/Pain Pain Assessment: Faces Faces Pain Scale: Hurts whole lot Pain Location: gluteal folds with excoriation Pain Descriptors / Indicators: Burning;Discomfort Pain Intervention(s): Monitored during session     Hand Dominance     Extremity/Trunk Assessment Upper Extremity Assessment Upper Extremity Assessment: RUE deficits/detail;LUE deficits/detail RUE Deficits / Details: 3+/5 shoulder strength, 4+/5 bicep, 4/5 tricep, 3+/5 wrist, 3+/5 grip RUE Sensation: decreased light touch (in hand) RUE Coordination: WNL LUE Deficits / Details: 4/5 shoulder strength, 4+/5 bicep, 4/5 tricep, 3+/5 wrist, 3+/5 grip LUE Sensation: decreased light touch (in hand) LUE Coordination: WNL   Lower Extremity Assessment Lower Extremity Assessment: Defer to PT evaluation RLE Deficits / Details: LT impaired distal Leg, deep pressure sensed but not specific to location, more sensation proximally, Dorsiflexion 0/5 , knee extension 2/5, hip flex 2/5, Abd . 2/5 LLE Deficits / Details: very similar to the right, decreased senastion, 0 dorsiflexion   Cervical / Trunk Assessment Cervical / Trunk Assessment: Other exceptions Cervical / Trunk Exceptions: decreased core strength, does tilt when challenged but catches self, trunk appears to be with increased slumping, not sitting erect.   Communication     Cognition Arousal/Alertness: Awake/alert Behavior During Therapy: WFL for tasks assessed/performed Overall Cognitive Status: Impaired/Different from baseline Area of Impairment: Orientation;Memory;Following commands;Awareness;Safety/judgement                 Orientation Level: Place;Time;Situation   Memory: Decreased short-term memory Following Commands: Follows one step commands inconsistently Safety/Judgement: Decreased awareness of deficits Awareness: Emergent   General Comments: per spouse, noted decline in cognitive function recently, ? hallucinations.   General Comments        Exercises     Shoulder Instructions      Home Living Family/patient expects to be discharged to:: Private residence Living Arrangements: Spouse/significant other;Parent Available Help at Discharge: Family;Available PRN/intermittently Type of Home: House Home Access: Ramped entrance     Home Layout: One level     Bathroom Shower/Tub: Chief Strategy Officer: Standard     Home Equipment: Environmental consultant - 4 wheels;Cane - single point          Prior Functioning/Environment Level of Independence: Needs assistance  Gait / Transfers Assistance Needed: has  not ambulated in ~ 2weeks per spouse, has been rolling on 4 wheeled  Rw. ADL's / Homemaking Assistance Needed: requires assistance with trnasfers.            OT Problem List: Decreased strength;Decreased range of motion;Decreased activity tolerance;Impaired balance (sitting and/or standing);Decreased cognition;Decreased safety awareness;Decreased knowledge of use of DME or AE;Pain      OT Treatment/Interventions: Self-care/ADL training;Therapeutic exercise;Neuromuscular education;DME and/or AE instruction;Therapeutic activities;Cognitive remediation/compensation;Balance training;Patient/family education    OT Goals(Current goals can be found in the care plan section) Acute Rehab OT Goals Patient Stated Goal: to get better OT Goal Formulation: With patient Time For Goal Achievement: 08/20/20 Potential to Achieve Goals: Good  OT Frequency: Min 2X/week   Barriers to D/C:            Co-evaluation PT/OT/SLP Co-Evaluation/Treatment: Yes Reason for Co-Treatment: Complexity of the patient's impairments (multi-system involvement);For patient/therapist safety PT goals addressed during session: Mobility/safety  with mobility OT goals addressed during session: ADL's and self-care      AM-PAC OT "6 Clicks" Daily Activity     Outcome Measure Help from another person eating meals?: None Help from another person  taking care of personal grooming?: A Little Help from another person toileting, which includes using toliet, bedpan, or urinal?: Total Help from another person bathing (including washing, rinsing, drying)?: A Lot Help from another person to put on and taking off regular upper body clothing?: A Little Help from another person to put on and taking off regular lower body clothing?: Total 6 Click Score: 14   End of Session Nurse Communication: Mobility status  Activity Tolerance: Patient tolerated treatment well Patient left: in bed;with call bell/phone within reach;with bed alarm set;with family/visitor present  OT Visit Diagnosis: Other abnormalities of gait and mobility (R26.89);History of falling (Z91.81);Muscle weakness (generalized) (M62.81);Other symptoms and signs involving the nervous system (R29.898);Other symptoms and signs involving cognitive function                Time: 4818-5631 OT Time Calculation (min): 39 min Charges:  OT General Charges $OT Visit: 1 Visit OT Evaluation $OT Eval Moderate Complexity: 1 Mod  Linh Hedberg, OTR/L Acute Care Rehab Services  Office [phone] Pager: [phone]   Kelli Churn 08/06/2020, 2:18 PM

## 2020-08-06 NOTE — Progress Notes (Signed)
MRI advised they wouldn't be able to do pt MRI tonight due to staffing. MD Blout notified of delay in patient care as well as advised of pt increased confusion and anxiety.

## 2020-08-06 NOTE — Progress Notes (Signed)
Inpatient Rehab Admissions Coordinator Note:   Per PT recommendation, pt was screened for CIR candidacy by Wolfgang Phoenix, MS, CCC-SLP.  At this time we are recommending an inpatient rehab consult.  AC will contact attending to request consult order.  Please contact me with questions.     Wolfgang Phoenix, MS, CCC-SLP Admissions Coordinator [phone] 08/06/20 12:57 PM

## 2020-08-06 NOTE — Progress Notes (Signed)
PROGRESS NOTE    Harold Flores  ZOX:[id number] DOB: 05-06-1991 DOA: 08/03/2020 PCP: Etta Grandchild, MD    Chief Complaint  Patient presents with  . Fatigue    Brief Narrative:   30 year old gentleman prior history of HIV, noncompliant to medications, essential hypertension, alcohol abuse, generalized anxiety disorder, GERD recently had lower extremity edema of unclear etiology and was started on  Lasix following which patient reports not feeling well with generalized weakness.  The swelling has much improved today after Lasix but on arrival to ED he was found to have's significantly low potassium of 2.  He was admitted to [facility] for further evaluation and management of severe hypokalemia and lower extremity edema.  Potassium is being replaced by IV and oral every day and he continues to report generalalized weakness,. On further discussions with the patient's husband at bedside this afternoon, his symptoms have been going on since one month. He has been non compliant to his HIV meds.  This morning RN reports that patinet has multiple loose stools, has intermittent bouts of confusion and hallucinating.  Assessment & Plan:   Principal Problem:   Hypokalemia Active Problems:   GAD (generalized anxiety disorder)   Essential hypertension, benign   Gastroesophageal reflux disease without esophagitis   Human immunodeficiency virus (HIV) disease (HCC)    HIV Non compliant to meds.  CD4 absolute count is 40, CD8 cells 548, HIV RNA 104,000  Restarted his home meds and requested ID consultation , .    H/o Syphilis:  Unclear if he has completed treatment.    Severe Hypokalemia: suspect from ongoing loose stools/ diarrhea.   Replaced and repeat in am.    Diarrhea/ Loose stools  Multiple episodes.  Get GI panel  Hypertension:  BP parameters are optimal .   GERD. Stable.    Hypomagnesemia:  Replaced.    Anemia of chronic disease/ anemia from folate deficiency.  Last  hemoglobin from 09/2019 is around 15, dropped to 9.1 to 8.9 to 7.9. Anemia panel ordered. Low folate levels.  Replacement ordered.  Get stool for occult blood.    H/o alcohol abuse: reports drinking more than 7 per day.  Continue with CIWA.  Has bouts of confusion and hallucinations today.  ? Withdrawal symptoms.    Generalized anxiety disorder:  Restart sertraline.    Lower extremity edema.  RESOLVED.    Hyponatremia:  From diuresis.  Improving.    Elevated troponins:  Pt denies any chest pain or sob.  ? From severe hypokalemia , anemia and demand ischemia.  ekg shows sinus tachycardia with  t wave inversions in the lateral leads. Echocardiogram ordered for further evaluation.   Echocardiogram no significant abnormalities.  CT angio of the chest negative for PE.  Cardiology consulted for elevated troponins, recommended no further work-up.   Unable to ambulate for more than a month now:  Was seen in PCP office on feb 10 th for similar complaints.  MRI of the brain and whole spine ordered after discussing with Dr Wilford Corner  in view of his HIV and syphilis history.  Dr Wilford Corner recommended getting spinal tap to evaluate for infection.  IR consult place for spinal tap and CSF to be sent for cell count, cytology, glucose, protein,  Vdrl, JC virus PCR, cryptococcus antigen.  Neurology consulted .   Tobacco abuse.  Counseled on use.     DVT prophylaxis: SCD's Code Status: Full code.  Family Communication: husband at bedside. Disposition:   Status is: Inpatient  Remains inpatient appropriate because:Ongoing diagnostic testing needed not appropriate for outpatient work up and IV treatments appropriate due to intensity of illness or inability to take PO   Dispo: The patient is from: Home              Anticipated d/c is to: Home              Patient currently is not medically stable to d/c.   Difficult to place patient No       Level of care: Telemetry Consultants:    Neurology   ID  Cardiology.   Procedures:echocardiogram.  Antimicrobials: none.   Subjective: Wants to go home , requesting alcohol.  His partner at bedside.  Objective: Vitals:   08/05/20 0513 08/05/20 1242 08/05/20 2041 08/06/20 0414  BP: 121/75 125/72 (!) 135/92 138/78  Pulse: 100 97 95 96  Resp: 18 18 18 18   Temp: 98 F (36.7 C) 98 F (36.7 C) 97.9 F (36.6 C) 98 F (36.7 C)  TempSrc: Oral Oral Oral Oral  SpO2: 100% 100% 100% 100%  Weight:      Height:        Intake/Output Summary (Last 24 hours) at 08/06/2020 1044 Last data filed at 08/05/2020 2100 Gross per 24 hour  Intake --  Output 520 ml  Net -520 ml   Filed Weights   08/04/20 0830  Weight: 82.8 kg    Examination:  General exam: Alert and not in distress.  Respiratory system: Clear to auscultation. No wheezing heard.  Cardiovascular system: S1S2, RRR, NO JVD, no pedal edema.  Gastrointestinal system: Abdomen is soft, nontender nondistended bowel sounds normal Central nervous system: Alert and oriented to person , place only. Able to move all extremities while in bed,but unable to ambulate or get up from bed.  Extremities: No pedal edema Skin: No rashes seen Psychiatry: restless today.     Data Reviewed: I have personally reviewed following labs and imaging studies  CBC: Recent Labs  Lab 08/03/20 2032 08/04/20 0350 08/05/20 0433  WBC 6.4 6.0 6.5  NEUTROABS 4.3  --   --   HGB 9.1* 8.9* 8.6*  HCT 25.3* 25.1* 24.2*  MCV 91.0 91.9 92.4  PLT 452* 427* 388    Basic Metabolic Panel: Recent Labs  Lab 08/03/20 2208 08/04/20 0102 08/04/20 0350 08/05/20 0433 08/05/20 1212 08/05/20 1655 08/06/20 0531  NA  --  131* 132* 135  --  137 136  K  --  3.1* 3.3* 2.5* 2.5* 2.8* 3.1*  CL  --  82* 86* 87*  --  89* 91*  CO2  --  34* 33* 33*  --  35* 34*  GLUCOSE  --  101* 78 86  --  91 100*  BUN  --  38* 35* 25*  --  19 15  CREATININE  --  1.21 1.06 0.85  --  0.87 0.75  CALCIUM  --  6.9* 6.7*  8.0*  --  8.2* 8.3*  MG 1.4* 1.5*  --  2.0  --   --   --   PHOS 4.5 4.5  --   --   --   --   --     GFR: Estimated Creatinine Clearance: 140.7 mL/min (by C-G formula based on SCr of 0.75 mg/dL).  Liver Function Tests: Recent Labs  Lab 08/03/20 2032 08/04/20 0102 08/04/20 0350  AST 51* 58* 57*  ALT 19 18 18   ALKPHOS 86 78 77  BILITOT 1.9* 1.9* 2.0*  PROT 6.0*  5.2* 5.3*  ALBUMIN 2.3* 2.1* 2.1*    CBG: No results for input(s): GLUCAP in the last 168 hours.   Recent Results (from the past 240 hour(s))  SARS CORONAVIRUS 2 (TAT 6-24 HRS) Nasopharyngeal Nasopharyngeal Swab     Status: None   Collection Time: 08/03/20 10:00 PM   Specimen: Nasopharyngeal Swab  Result Value Ref Range Status   SARS Coronavirus 2 NEGATIVE NEGATIVE Final    Comment: (NOTE) SARS-CoV-2 target nucleic acids are NOT DETECTED.  The SARS-CoV-2 RNA is generally detectable in upper and lower respiratory specimens during the acute phase of infection. Negative results do not preclude SARS-CoV-2 infection, do not rule out co-infections with other pathogens, and should not be used as the sole basis for treatment or other patient management decisions. Negative results must be combined with clinical observations, patient history, and epidemiological information. The expected result is Negative.  Fact Sheet for Patients: HairSlick.no  Fact Sheet for Healthcare Providers: quierodirigir.com  This test is not yet approved or cleared by the [place] FDA and  has been authorized for detection and/or diagnosis of SARS-CoV-2 by FDA under an Emergency Use Authorization (EUA). This EUA will remain  in effect (meaning this test can be used) for the duration of the COVID-19 declaration under Se ction 564(b)(1) of the Act, 21 U.S.C. section 360bbb-3(b)(1), unless the authorization is terminated or revoked sooner.  Performed at [facility], [address] [place], [place] [address]          Radiology Studies: CT ANGIO CHEST PE W OR WO CONTRAST  Result Date: 08/05/2020 CLINICAL DATA:  Tech cardia, elevated troponins, lower extremity edema PE suspected. EXAM: CT ANGIOGRAPHY CHEST WITH CONTRAST TECHNIQUE: Multidetector CT imaging of the chest was performed using the standard protocol during bolus administration of intravenous contrast. Multiplanar CT image reconstructions and MIPs were obtained to evaluate the vascular anatomy. CONTRAST:  OMNIPAQUE IOHEXOL 350 MG/ML SOLN COMPARISON:  Chest radiograph August 03, 2020. FINDINGS: Cardiovascular: Satisfactory opacification of the pulmonary arteries to the segmental level. No evidence of pulmonary embolism. Normal heart size. No pericardial effusion. Mediastinum/Nodes: No mediastinal, hilar or axillary lymphadenopathy. Thyroid glands unremarkable. Esophagus and trachea are unremarkable. Lungs/Pleura: There are a few ground-glass nodular opacities in the right upper lobe for instance on image 50/6 and 48/6. No lobar consolidations. No pleural effusion. No pneumothorax. Upper Abdomen: No acute abnormality. Musculoskeletal: No chest wall abnormality. No acute or significant osseous findings. Review of the MIP images confirms the above findings. IMPRESSION: 1. No evidence of acute pulmonary embolism. 2. Few ground-glass nodular opacities in the right upper lobe, which may reflect an infectious/inflammatory process. Electronically Signed   By: Maudry Mayhew MD   On: 08/05/2020 11:00   ECHOCARDIOGRAM COMPLETE  Result Date: 08/04/2020    ECHOCARDIOGRAM REPORT   Patient Name:   Harold Flores Date of Exam: 08/04/2020 Medical Rec #:  [id number]          Height:       70.0 in Accession #:    [id number]         Weight:       182.5 lb Date of Birth:  04/17/1991           BSA:          2.008 m Patient Age:    30 years           BP:           106/75 mmHg Patient Gender:  M                  HR:           94  bpm. Exam Location:  Inpatient Procedure: 2D Echo, Cardiac Doppler and Color Doppler Indications:    Bilateral Lower Extremity Edema 132440.  History:        Patient has no prior history of Echocardiogram examinations.                 Risk Factors:Hypertension. HIV. GERD.  Sonographer:    Elmarie Shiley Dance Referring Phys: Nilda Calamity Haroon Shatto IMPRESSIONS  1. Left ventricular ejection fraction, by estimation, is 60 to 65%. The left ventricle has normal function. The left ventricle has no regional wall motion abnormalities. There is mild left ventricular hypertrophy. Left ventricular diastolic parameters were normal.  2. Right ventricular systolic function is normal. The right ventricular size is normal.  3. Left atrial size was mildly dilated.  4. The mitral valve is normal in structure. No evidence of mitral valve regurgitation. No evidence of mitral stenosis.  5. The aortic valve is normal in structure. Aortic valve regurgitation is not visualized. No aortic stenosis is present.  6. Aortic dilatation noted. There is mild dilatation at the level of the sinuses of Valsalva, measuring 40 mm.  7. The inferior vena cava is normal in size with greater than 50% respiratory variability, suggesting right atrial pressure of 3 mmHg. FINDINGS  Left Ventricle: Left ventricular ejection fraction, by estimation, is 60 to 65%. The left ventricle has normal function. The left ventricle has no regional wall motion abnormalities. The left ventricular internal cavity size was normal in size. There is  mild left ventricular hypertrophy. Left ventricular diastolic parameters were normal. Right Ventricle: The right ventricular size is normal. No increase in right ventricular wall thickness. Right ventricular systolic function is normal. Left Atrium: Left atrial size was mildly dilated. Right Atrium: Right atrial size was normal in size. Pericardium: There is no evidence of pericardial effusion. Mitral Valve: The mitral valve is normal in  structure. No evidence of mitral valve regurgitation. No evidence of mitral valve stenosis. Tricuspid Valve: The tricuspid valve is normal in structure. Tricuspid valve regurgitation is not demonstrated. No evidence of tricuspid stenosis. Aortic Valve: The aortic valve is normal in structure. Aortic valve regurgitation is not visualized. No aortic stenosis is present. Pulmonic Valve: The pulmonic valve was normal in structure. Pulmonic valve regurgitation is not visualized. No evidence of pulmonic stenosis. Aorta: Aortic dilatation noted. There is mild dilatation at the level of the sinuses of Valsalva, measuring 40 mm. Venous: The inferior vena cava is normal in size with greater than 50% respiratory variability, suggesting right atrial pressure of 3 mmHg. IAS/Shunts: No atrial level shunt detected by color flow Doppler.  LEFT VENTRICLE PLAX 2D LVIDd:         5.00 cm  Diastology LVIDs:         3.30 cm  LV e' medial:    9.68 cm/s LV PW:         1.30 cm  LV E/e' medial:  8.8 LV IVS:        0.90 cm  LV e' lateral:   12.40 cm/s LVOT diam:     2.40 cm  LV E/e' lateral: 6.9 LV SV:         98 LV SV Index:   49 LVOT Area:     4.52 cm  RIGHT VENTRICLE  IVC RV Basal diam:  3.00 cm     IVC diam: 1.60 cm RV Mid diam:    2.40 cm RV S prime:     12.90 cm/s TAPSE (M-mode): 2.8 cm LEFT ATRIUM             Index       RIGHT ATRIUM           Index LA diam:        4.20 cm 2.09 cm/m  RA Area:     18.10 cm LA Vol (A2C):   88.5 ml 44.08 ml/m RA Volume:   51.70 ml  25.75 ml/m LA Vol (A4C):   78.0 ml 38.85 ml/m LA Biplane Vol: 86.3 ml 42.98 ml/m  AORTIC VALVE LVOT Vmax:   109.00 cm/s LVOT Vmean:  78.400 cm/s LVOT VTI:    0.217 m  AORTA Ao Root diam: 4.00 cm Ao Asc diam:  3.20 cm MITRAL VALVE MV Area (PHT): 2.71 cm    SHUNTS MV Decel Time: 280 msec    Systemic VTI:  0.22 m MV E velocity: 85.60 cm/s  Systemic Diam: 2.40 cm Donato SchultzMark Skains MD Electronically signed by Donato SchultzMark Skains MD Signature Date/Time: 08/04/2020/3:13:38 PM     Final         Scheduled Meds: . abacavir-dolutegravir-lamiVUDine  1 tablet Oral Daily  . carvedilol  6.25 mg Oral BID WC  . folic acid  1 mg Oral Daily  . multivitamin with minerals  1 tablet Oral Daily  . potassium chloride  40 mEq Oral TID  . sertraline  100 mg Oral Daily  . thiamine  100 mg Oral Daily   Or  . thiamine  100 mg Intravenous Daily   Continuous Infusions:    LOS: 3 days        Kathlen ModyVijaya Torre [place], MD [facility]   To contact the attending provider between 7A-7P or the covering provider during after hours 7P-7A, please log into the web site [email or web address] and access using universal [place] password for that web site. If you do not have the password, please call the hospital operator.  08/06/2020, 10:44 AM

## 2020-08-07 ENCOUNTER — Inpatient Hospital Stay (HOSPITAL_COMMUNITY): Payer: Medicaid Other

## 2020-08-07 DIAGNOSIS — G9340 Encephalopathy, unspecified: Secondary | ICD-10-CM

## 2020-08-07 DIAGNOSIS — G9341 Metabolic encephalopathy: Secondary | ICD-10-CM | POA: Insufficient documentation

## 2020-08-07 DIAGNOSIS — E876 Hypokalemia: Secondary | ICD-10-CM

## 2020-08-07 DIAGNOSIS — B20 Human immunodeficiency virus [HIV] disease: Secondary | ICD-10-CM

## 2020-08-07 LAB — BASIC METABOLIC PANEL
Anion gap: 13 (ref 5–15)
BUN: 9 mg/dL (ref 6–20)
CO2: 30 mmol/L (ref 22–32)
Calcium: 8.5 mg/dL — ABNORMAL LOW (ref 8.9–10.3)
Chloride: 93 mmol/L — ABNORMAL LOW (ref 98–111)
Creatinine, Ser: 0.7 mg/dL (ref 0.61–1.24)
GFR, Estimated: >60 mL/min (ref >60)
Glucose, Bld: 76 mg/dL (ref 70–99)
Potassium: 4.1 mmol/L (ref 3.5–5.1)
Sodium: 136 mmol/L (ref 135–145)

## 2020-08-07 LAB — GASTROINTESTINAL PANEL BY PCR, STOOL (REPLACES STOOL CULTURE)

## 2020-08-07 LAB — CBC
HCT: 23.4 % — ABNORMAL LOW (ref 39.0–52.0)
Hemoglobin: 7.9 g/dL — ABNORMAL LOW (ref 13.0–17.0)
MCH: 32.2 pg (ref 26.0–34.0)
MCHC: 33.8 g/dL (ref 30.0–36.0)
MCV: 95.5 fL (ref 80.0–100.0)
Platelets: 359 10*3/uL (ref 150–400)
RBC: 2.45 MIL/uL — ABNORMAL LOW (ref 4.22–5.81)
RDW: 19.4 % — ABNORMAL HIGH (ref 11.5–15.5)
WBC: 8.1 10*3/uL (ref 4.0–10.5)
nRBC: 0 % (ref 0.0–0.2)

## 2020-08-07 LAB — SEDIMENTATION RATE: Sed Rate: 30 mm/hr — ABNORMAL HIGH (ref 0–16)

## 2020-08-07 LAB — CRYPTOCOCCAL ANTIGEN: Crypto Ag: NEGATIVE

## 2020-08-07 LAB — PROTIME-INR
INR: 1.1 (ref 0.8–1.2)
Prothrombin Time: 14.2 seconds (ref 11.4–15.2)

## 2020-08-07 LAB — CK: Total CK: 376 U/L (ref 49–397)

## 2020-08-07 LAB — LACTATE DEHYDROGENASE: LDH: 459 U/L — ABNORMAL HIGH (ref 98–192)

## 2020-08-07 LAB — C-REACTIVE PROTEIN: CRP: 0.7 mg/dL (ref <1.0)

## 2020-08-07 MED ORDER — LIP MEDEX EX OINT
TOPICAL_OINTMENT | CUTANEOUS | Status: DC | PRN
  Filled 2020-08-07: qty 7

## 2020-08-07 NOTE — Plan of Care (Signed)
  Problem: Health Behavior/Discharge Planning: Goal: Ability to manage health-related needs will improve Outcome: Progressing   Problem: Clinical Measurements: Goal: Ability to maintain clinical measurements within normal limits will improve Outcome: Progressing   Problem: Activity: Goal: Risk for activity intolerance will decrease Outcome: Progressing   

## 2020-08-07 NOTE — Progress Notes (Signed)
Advised MD Blout of pt BP of 146/107, pt is asymptomatic, and no PRN BP meds on MAR. Awaiting response/orders.

## 2020-08-07 NOTE — Progress Notes (Signed)
Report given to Sam, Charity fundraiser at [facility].

## 2020-08-07 NOTE — Progress Notes (Signed)
PROGRESS NOTE    Harold Flores  ZSW:[id number] DOB: 03-25-1991 DOA: 08/03/2020 PCP: Etta Grandchild, MD    Chief Complaint  Patient presents with   Fatigue    Brief Narrative:   30 year old gentleman prior history of HIV, noncompliant to medications, essential hypertension, alcohol abuse, generalized anxiety disorder, GERD recently had lower extremity edema of unclear etiology and was started on  Lasix following which patient reports not feeling well with generalized weakness.  The swelling has much improved today after Lasix but on arrival to ED he was found to have's significantly low potassium of 2.  He was admitted to [facility] for further evaluation and management of severe hypokalemia and lower extremity edema.  Potassium is being replaced by IV and oral every day and he continues to report generalalized weakness,. On further discussions with the patient's husband at bedside this afternoon, his symptoms have been going on since one month. He has been non compliant to his HIV meds.  This morning RN reports that patinet has multiple loose stools, has intermittent bouts of confusion and hallucinating.    Assessment & Plan:   Principal Problem:   Hypokalemia Active Problems:   GAD (generalized anxiety disorder)   Essential hypertension, benign   Gastroesophageal reflux disease without esophagitis   Human immunodeficiency virus (HIV) disease (HCC)    Acute metabolic encephalopathy:   differential include delirium from alcohol withdrawal vs infectious etiology from HIV.   HIV Non compliant to meds.  CD4 absolute count is 40, CD8 cells 548, HIV RNA 104,000  Restarted his home meds and requested ID consultation , .    H/o Syphilis:  Unclear if he has completed treatment.    Severe Hypokalemia: suspect from ongoing loose stools/ diarrhea.   Replaced.    Diarrhea/ Loose stools  Multiple episodes.  GI pathogen pCR is pending.   Hypertension:  Sub optimally controlled. On  coreg.   GERD. Stable.     Hypomagnesemia:  Replaced.    Anemia of chronic disease/ anemia from folate deficiency.  Last hemoglobin from 09/2019 is around 15, dropped to 9.1 to 8.9 to 7.9. Anemia panel ordered. Low folate levels.  Replacement ordered.  Get stool for occult blood.    H/o alcohol abuse: reports drinking more than 7 per day.  Continue with CIWA.  Has bouts of confusion and hallucinations  Intermittently,   ? Withdrawal symptoms.    Generalized anxiety disorder:  Restart sertraline.    Lower extremity edema.  Resolved.    Hyponatremia:  From diuresis.  Improving.    Elevated troponins:  Pt denies any chest pain or sob.  ? From severe hypokalemia , anemia and demand ischemia.  ekg shows sinus tachycardia with  t wave inversions in the lateral leads. Echocardiogram ordered for further evaluation.   Echocardiogram no significant abnormalities.  CT angio of the chest negative for PE.  Cardiology consulted for elevated troponins, recommended no further work-up.   Unable to ambulate for more than a month now:  Was seen in PCP office on feb 10 th for similar complaints.  MRI of the brain and whole spine ordered after discussing with Dr Wilford Corner  in view of his HIV and syphilis history.  Dr Wilford Corner recommended getting spinal tap to evaluate for infection.  Orders placed .  IR consult  In place for spinal tap and CSF to be sent for cell count, cytology, glucose, protein,  Vdrl, JC virus PCR, cryptococcus antigen.  Neurology consulted .  In view of  his acute metabolic and toxic encephalopathy, MRI recommended for the tests to be done under general anesthesia.  Discussed with Dr Corliss Skainseveshwar with neuro IR who will see the patient after MRI's are done and schedule him for LP, [facility] . Discussed with Dr Amada JupiterKirkpatrick, who agreed for the transfer.  Called his mother and his partner and updated them. Their phone numbers are Delphina CahillLaura Kneisel  [phone], significant other name is Chrissie NoaWilliam [phone], his dad's number is [phone]. .     Tobacco abuse.  Counseled on use.     DVT prophylaxis: SCD's Code Status: Full code.  Family Communication:none at bedside.  Disposition:   Status is: Inpatient  Remains inpatient appropriate because:Ongoing diagnostic testing needed not appropriate for outpatient work up and IV treatments appropriate due to intensity of illness or inability to take PO   Dispo: The patient is from: Home              Anticipated d/c is to: Home              Patient currently is not medically stable to d/c.   Difficult to place patient No       Level of care: Med-Surg Consultants:   Neurology   ID  Cardiology.   Procedures:echocardiogram.  Antimicrobials: none.   Subjective:  sleepy this am,not in distress, confused.  Objective: Vitals:   08/06/20 1417 08/06/20 2056 08/07/20 0455 08/07/20 0640  BP: (!) 142/94 109/64 (!) 143/106 (!) 146/107  Pulse: 87 87 94 76  Resp: 16 16 16    Temp: 97.8 F (36.6 C) 98.1 F (36.7 C) 98.1 F (36.7 C)   TempSrc: Oral Oral Oral   SpO2: 100% 97% 100%   Weight:      Height:       No intake or output data in the 24 hours ending 08/07/20 1234 Filed Weights   08/04/20 0830  Weight: 82.8 kg    Examination:  General exam: wakes up on verbal cues, not in distress.  Respiratory system: clear to auscultation, no wheezing heard. On room air.  Cardiovascular system: S1S2, RRR, no JVD,  Gastrointestinal system: Abdomen is soft, NT ND BS+ Central nervous system:  Alert and comfortable, follows simple commands.  Extremities: no cyanosis or clubbing.  Skin: no ulcers or rashes.  Psychiatry: restless, cannot be assessed..     Data Reviewed: I have personally reviewed following labs and imaging studies  CBC: Recent Labs  Lab 08/03/20 2032 08/04/20 0350 08/05/20 0433 08/06/20 0531 08/07/20 0746  WBC 6.4 6.0 6.5  --  8.1  NEUTROABS 4.3   --   --   --   --   HGB 9.1* 8.9* 8.6* 7.9* 7.9*  HCT 25.3* 25.1* 24.2* 23.0* 23.4*  MCV 91.0 91.9 92.4  --  95.5  PLT 452* 427* 388  --  359    Basic Metabolic Panel: Recent Labs  Lab 08/03/20 2208 08/04/20 0102 08/04/20 0350 08/05/20 0433 08/05/20 1212 08/05/20 1655 08/06/20 0531 08/07/20 0746  NA  --  131* 132* 135  --  137 136 136  K  --  3.1* 3.3* 2.5* 2.5* 2.8* 3.1* 4.1  CL  --  82* 86* 87*  --  89* 91* 93*  CO2  --  34* 33* 33*  --  35* 34* 30  GLUCOSE  --  101* 78 86  --  91 100* 76  BUN  --  38* 35* 25*  --  19 15 9  CREATININE  --  1.21 1.06 0.85  --  0.87 0.75 0.70  CALCIUM  --  6.9* 6.7* 8.0*  --  8.2* 8.3* 8.5*  MG 1.4* 1.5*  --  2.0  --   --   --   --   PHOS 4.5 4.5  --   --   --   --   --   --     GFR: Estimated Creatinine Clearance: 140.7 mL/min (by C-G formula based on SCr of 0.7 mg/dL).  Liver Function Tests: Recent Labs  Lab 08/03/20 2032 08/04/20 0102 08/04/20 0350 08/06/20 1712  AST 51* 58* 57* 61*  ALT 19 18 18 22   ALKPHOS 86 78 77 82  BILITOT 1.9* 1.9* 2.0* 1.2  PROT 6.0* 5.2* 5.3* 5.9*  ALBUMIN 2.3* 2.1* 2.1* 2.5*    CBG: No results for input(s): GLUCAP in the last 168 hours.   Recent Results (from the past 240 hour(s))  SARS CORONAVIRUS 2 (TAT 6-24 HRS) Nasopharyngeal Nasopharyngeal Swab     Status: None   Collection Time: 08/03/20 10:00 PM   Specimen: Nasopharyngeal Swab  Result Value Ref Range Status   SARS Coronavirus 2 NEGATIVE NEGATIVE Final    Comment: (NOTE) SARS-CoV-2 target nucleic acids are NOT DETECTED.  The SARS-CoV-2 RNA is generally detectable in upper and lower respiratory specimens during the acute phase of infection. Negative results do not preclude SARS-CoV-2 infection, do not rule out co-infections with other pathogens, and should not be used as the sole basis for treatment or other patient management decisions. Negative results must be combined with clinical observations, patient history, and  epidemiological information. The expected result is Negative.  Fact Sheet for Patients: 08/05/20  Fact Sheet for Healthcare Providers: HairSlick.no  This test is not yet approved or cleared by the [place] FDA and  has been authorized for detection and/or diagnosis of SARS-CoV-2 by FDA under an Emergency Use Authorization (EUA). This EUA will remain  in effect (meaning this test can be used) for the duration of the COVID-19 declaration under Se ction 564(b)(1) of the Act, 21 U.S.C. section 360bbb-3(b)(1), unless the authorization is terminated or revoked sooner.  Performed at [facility], [address], [place], [place]          Radiology Studies: DG Abd 1 View  Result Date: 08/07/2020 CLINICAL DATA:  MRI clearance. EXAM: ABDOMEN - 1 VIEW COMPARISON:  None. FINDINGS: No radiopaque foreign body. Nonobstructive bowel gas pattern. No supine evidence of pneumoperitoneum. No pneumatosis or portal venous gas. No definitive abnormal intra-abdominal calcifications. Limited visualization of lower thorax is normal given technique. Punctate bone island is seen within the left femoral neck. No acute osseous abnormalities. IMPRESSION: No radiopaque foreign body.  OK to proceed to MRI. Electronically Signed   By: 08/09/2020 M.D.   On: 08/07/2020 07:41        Scheduled Meds:  carvedilol  6.25 mg Oral BID WC   folic acid  1 mg Oral Daily   multivitamin with minerals  1 tablet Oral Daily   sertraline  100 mg Oral Daily   thiamine  100 mg Oral Daily   Or   thiamine  100 mg Intravenous Daily   Continuous Infusions:    LOS: 4 days        08/09/2020, MD [facility]   To contact the attending provider between 7A-7P or the covering provider during after hours 7P-7A, please log into the web site [email or web address] and access  using universal [place] password for that web site. If you  do not have the password, please call the hospital operator.  08/07/2020, 12:34 PM

## 2020-08-07 NOTE — Progress Notes (Signed)
Patient was found down by PrImary RN at 1610 on the floor next to the bed.  Patient reports no loss of consciousness and verbalized he did not hit his head.  Patient was assessed for any visible injuries with none identified and all VSS.  Pt with loss of motor function skills due to encephalopathy and required a hoyar lift for transfer back to bed.  Patient was assisted to bed by primary RN and nursing staff without any complications.  Pahwani MD made aware of clinical situation with no new orders at this time.  Will con't to monitor patient throughout the shift for any changes in behavior.

## 2020-08-07 NOTE — Consult Note (Signed)
[facility]       Reason for Consult: encephalopathy    Referring Physician: Dr. Blake Divine  Principal Problem:   Hypokalemia Active Problems:   GAD (generalized anxiety disorder)   Essential hypertension, benign   Gastroesophageal reflux disease without esophagitis   Human immunodeficiency virus (HIV) disease (HCC)   . carvedilol  6.25 mg Oral BID WC  . folic acid  1 mg Oral Daily  . multivitamin with minerals  1 tablet Oral Daily  . sertraline  100 mg Oral Daily  . thiamine  100 mg Oral Daily   Or  . thiamine  100 mg Intravenous Daily    Recommendations: MRI brain and spine LP for all tests ordered by neurology and including opening pressure Hold on ARVs pending above   Assessment: He has poorly controlled HIV with a CD 4 of just 40 and with some encephalopathy and lack of movement of his bilateral feet and legs with neuropathy concerning for an infectious process vs non-infectious. Differential is broad including CNS/spinal viral infection including CMV and other herpes viruses, cryptococcal meningitis with increased CNS pressure (serum Ag is negative though), JC virus or other encephalopathy such as HIV-associated, Tb.  Will hold off on the ARVs pending evaluation and needs an LP and MRI studies ASAP.    Antibiotics: none  HPI: Harold Flores is a 30 y.o. male with poorly controlled HIV last seen by me in March 2020 and has been off his Triumeq since that time presented with gait issues, parethesias in hands and feet and AMS.  His CD4 is just 40 with a viral load at his baseline of 104,000.  He states he has not followed up with me due to his issues with anxiety.  He was being treated with lasix for bilateral leg edema by his PCP after refusing to go to ED/UC as instructed.  He has a history of significant alcohol use.     Review of Systems:  Constitutional: negative for fevers, chills and weight loss Eyes: negative for visual  disturbance Respiratory: negative for cough, sputum or hemoptysis Cardiovascular: negative for chest pain, chest pressure/discomfort, dyspnea Gastrointestinal: negative for nausea and diarrhea Integument/breast: negative for rash Musculoskeletal: positive for muscle weakness, negative for myalgias and arthralgias Neurological: positive for paresthesia, gait problems and weakness Behavioral/Psych: positive for anxiety All other systems reviewed and are negative    Past Medical History:  Diagnosis Date  . Anal fissure   . Anal fistula   . Anal ulcer   . Asthma   . Epistaxis   . GAD (generalized anxiety disorder)   . GERD (gastroesophageal reflux disease)   . Hemorrhoid   . HIV (human immunodeficiency virus infection) (HCC)   . Hypertension   . Syphilis   . Varicella     Social History   Tobacco Use  . Smoking status: Current Every Day Smoker    Packs/day: 0.50    Years: 5.00    Pack years: 2.50    Types: E-cigarettes, Cigarettes  . Smokeless tobacco: Never Used  Substance Use Topics  . Alcohol use: Yes    Alcohol/week: 7.0 standard drinks    Types: 7 Cans of beer per week    Comment: Rarely  . Drug use: Yes    Types: Marijuana    Family History  Problem Relation Age of Onset  . Diabetes Mother   . Hyperlipidemia Mother   . Diabetes Maternal Grandmother   . Heart disease Paternal Grandfather   .  Cancer Neg Hx     No Known Allergies  Physical Exam: Constitutional: in no apparent distress  Vitals:   08/07/20 0455 08/07/20 0640  BP: (!) 143/106 (!) 146/107  Pulse: 94 76  Resp: 16   Temp: 98.1 F (36.7 C)   SpO2: 100%    EYES: anicteric ENMT: no thrush Cardiovascular: Cor RRR Respiratory: clear; GI: soft, nt Musculoskeletal: some non-pitting edema of bilateral legs Skin: negatives: no rash Neuro: no movement of his bilateral legs, some spasm of his arms but otherwise full ROM  Lab Results  Component Value Date   WBC 8.1 08/07/2020   HGB 7.9 (L)  08/07/2020   HCT 23.4 (L) 08/07/2020   MCV 95.5 08/07/2020   PLT 359 08/07/2020    Lab Results  Component Value Date   CREATININE 0.70 08/07/2020   BUN 9 08/07/2020   NA 136 08/07/2020   K 4.1 08/07/2020   CL 93 (L) 08/07/2020   CO2 30 08/07/2020    Lab Results  Component Value Date   ALT 22 08/06/2020   AST 61 (H) 08/06/2020   ALKPHOS 82 08/06/2020     Microbiology: Recent Results (from the past 240 hour(s))  SARS CORONAVIRUS 2 (TAT 6-24 HRS) Nasopharyngeal Nasopharyngeal Swab     Status: None   Collection Time: 08/03/20 10:00 PM   Specimen: Nasopharyngeal Swab  Result Value Ref Range Status   SARS Coronavirus 2 NEGATIVE NEGATIVE Final    Comment: (NOTE) SARS-CoV-2 target nucleic acids are NOT DETECTED.  The SARS-CoV-2 RNA is generally detectable in upper and lower respiratory specimens during the acute phase of infection. Negative results do not preclude SARS-CoV-2 infection, do not rule out co-infections with other pathogens, and should not be used as the sole basis for treatment or other patient management decisions. Negative results must be combined with clinical observations, patient history, and epidemiological information. The expected result is Negative.  Fact Sheet for Patients: HairSlick.no  Fact Sheet for Healthcare Providers: quierodirigir.com  This test is not yet approved or cleared by the [place] FDA and  has been authorized for detection and/or diagnosis of SARS-CoV-2 by FDA under an Emergency Use Authorization (EUA). This EUA will remain  in effect (meaning this test can be used) for the duration of the COVID-19 declaration under Se ction 564(b)(1) of the Act, 21 U.S.C. section 360bbb-3(b)(1), unless the authorization is terminated or revoked sooner.  Performed at [facility], [address], [place], [place] [address]     Gardiner Barefoot, MD [facility] [email or web address] 08/07/2020, 11:53 AM

## 2020-08-08 ENCOUNTER — Encounter (HOSPITAL_COMMUNITY): Payer: Self-pay | Admitting: Internal Medicine

## 2020-08-08 ENCOUNTER — Inpatient Hospital Stay (HOSPITAL_COMMUNITY): Payer: Medicaid Other

## 2020-08-08 ENCOUNTER — Encounter (HOSPITAL_COMMUNITY)
Admission: EM | Disposition: A | Discharge status: Rehabilitation Facility | Payer: Self-pay | Source: Home / Self Care | Attending: Internal Medicine

## 2020-08-08 ENCOUNTER — Inpatient Hospital Stay (HOSPITAL_COMMUNITY): Payer: Medicaid Other | Admitting: Certified Registered Nurse Anesthetist

## 2020-08-08 ENCOUNTER — Other Ambulatory Visit: Payer: Self-pay | Admitting: Neurology

## 2020-08-08 DIAGNOSIS — G9340 Encephalopathy, unspecified: Secondary | ICD-10-CM

## 2020-08-08 DIAGNOSIS — R29898 Other symptoms and signs involving the musculoskeletal system: Secondary | ICD-10-CM

## 2020-08-08 DIAGNOSIS — F411 Generalized anxiety disorder: Secondary | ICD-10-CM

## 2020-08-08 DIAGNOSIS — I10 Essential (primary) hypertension: Secondary | ICD-10-CM

## 2020-08-08 DIAGNOSIS — K219 Gastro-esophageal reflux disease without esophagitis: Secondary | ICD-10-CM

## 2020-08-08 HISTORY — PX: RADIOLOGY WITH ANESTHESIA: SHX6223

## 2020-08-08 LAB — CRYPTOCOCCAL ANTIGEN, CSF: Crypto Ag: NEGATIVE

## 2020-08-08 LAB — CSF CELL COUNT WITH DIFFERENTIAL
RBC Count, CSF: 1 /mm3 — ABNORMAL HIGH
RBC Count, CSF: 1 /mm3 — ABNORMAL HIGH
Tube #: 1
Tube #: 4
WBC, CSF: 1 /mm3 (ref 0–5)
WBC, CSF: 0 /mm3 (ref 0–5)

## 2020-08-08 LAB — RPR
RPR Ser Ql: REACTIVE — AB
RPR Titer: 1:1 {titer}

## 2020-08-08 LAB — MRSA PCR SCREENING: MRSA by PCR: NEGATIVE

## 2020-08-08 LAB — PROTEIN AND GLUCOSE, CSF
Glucose, CSF: 50 mg/dL (ref 40–70)
Total  Protein, CSF: 22 mg/dL (ref 15–45)

## 2020-08-08 LAB — CMV DNA, QUANTITATIVE, PCR
CMV DNA Quant: POSITIVE IU/mL
Log10 CMV Qn DNA Pl: UNDETERMINED log10 IU/mL

## 2020-08-08 SURGERY — MRI WITH ANESTHESIA
Anesthesia: General

## 2020-08-08 MED ORDER — FENTANYL CITRATE (PF) 250 MCG/5ML IJ SOLN
INTRAMUSCULAR | Status: DC | PRN
  Administered 2020-08-08: 100 ug via INTRAVENOUS

## 2020-08-08 MED ORDER — ORAL CARE MOUTH RINSE: 15.0000 mL | Freq: Once | OROMUCOSAL | Status: AC

## 2020-08-08 MED ORDER — THIAMINE HCL 100 MG PO TABS: 100.0000 mg | ORAL_TABLET | Freq: Every day | ORAL | Status: DC

## 2020-08-08 MED ORDER — HYDRALAZINE HCL 20 MG/ML IJ SOLN
INTRAMUSCULAR | Status: AC
  Filled 2020-08-08: qty 1

## 2020-08-08 MED ORDER — PROPOFOL 500 MG/50ML IV EMUL
INTRAVENOUS | Status: DC | PRN
  Administered 2020-08-08: 300 mg via INTRAVENOUS

## 2020-08-08 MED ORDER — GADOBUTROL 1 MMOL/ML IV SOLN
8.0000 mL | Freq: Once | INTRAVENOUS | Status: AC | PRN
  Administered 2020-08-08: 8 mL via INTRAVENOUS

## 2020-08-08 MED ORDER — PHENYLEPHRINE 40 MCG/ML (10ML) SYRINGE FOR IV PUSH (FOR BLOOD PRESSURE SUPPORT)
PREFILLED_SYRINGE | INTRAVENOUS | Status: DC | PRN
  Administered 2020-08-08: 80 ug via INTRAVENOUS
  Administered 2020-08-08: 120 ug via INTRAVENOUS

## 2020-08-08 MED ORDER — ADULT MULTIVITAMIN W/MINERALS CH: 1.0000 | ORAL_TABLET | Freq: Every day | ORAL | Status: DC

## 2020-08-08 MED ORDER — GERHARDT'S BUTT CREAM
TOPICAL_CREAM | Freq: Three times a day (TID) | CUTANEOUS | Status: DC
  Administered 2020-08-11: 1 via TOPICAL
  Filled 2020-08-08: qty 1

## 2020-08-08 MED ORDER — FOLIC ACID 1 MG PO TABS: 1.0000 mg | ORAL_TABLET | Freq: Every day | ORAL | Status: DC

## 2020-08-08 MED ORDER — METOPROLOL TARTRATE 5 MG/5ML IV SOLN
5.0000 mg | Freq: Once | INTRAVENOUS | Status: AC
  Administered 2020-08-08: 5 mg via INTRAVENOUS
  Filled 2020-08-08: qty 5

## 2020-08-08 MED ORDER — ROCURONIUM BROMIDE 10 MG/ML (PF) SYRINGE
PREFILLED_SYRINGE | INTRAVENOUS | Status: DC | PRN
  Administered 2020-08-08 (×2): 50 mg via INTRAVENOUS

## 2020-08-08 MED ORDER — PHENYLEPHRINE HCL-NACL 10-0.9 MG/250ML-% IV SOLN
INTRAVENOUS | Status: DC | PRN
  Administered 2020-08-08: 20 ug/min via INTRAVENOUS

## 2020-08-08 MED ORDER — ONDANSETRON HCL 4 MG/2ML IJ SOLN
INTRAMUSCULAR | Status: DC | PRN
  Administered 2020-08-08: 4 mg via INTRAVENOUS

## 2020-08-08 MED ORDER — PROPOFOL 10 MG/ML IV BOLUS
INTRAVENOUS | Status: DC | PRN
  Administered 2020-08-08: 200 mg via INTRAVENOUS

## 2020-08-08 MED ORDER — DEXAMETHASONE SODIUM PHOSPHATE 10 MG/ML IJ SOLN
INTRAMUSCULAR | Status: DC | PRN
  Administered 2020-08-08: 5 mg via INTRAVENOUS

## 2020-08-08 MED ORDER — LIDOCAINE 2% (20 MG/ML) 5 ML SYRINGE
INTRAMUSCULAR | Status: DC | PRN
  Administered 2020-08-08: 100 mg via INTRAVENOUS

## 2020-08-08 MED ORDER — CHLORHEXIDINE GLUCONATE 0.12 % MT SOLN
OROMUCOSAL | Status: AC
  Administered 2020-08-08: 15 mL via OROMUCOSAL
  Filled 2020-08-08: qty 15

## 2020-08-08 MED ORDER — HYDRALAZINE HCL 20 MG/ML IJ SOLN
10.0000 mg | Freq: Once | INTRAMUSCULAR | Status: AC
  Administered 2020-08-08: 10 mg via INTRAVENOUS

## 2020-08-08 MED ORDER — LORAZEPAM 1 MG PO TABS
1.0000 mg | ORAL_TABLET | ORAL | Status: AC | PRN
  Administered 2020-08-11 (×2): 2 mg via ORAL
  Filled 2020-08-08: qty 1
  Filled 2020-08-08: qty 2
  Filled 2020-08-08: qty 1

## 2020-08-08 MED ORDER — LORAZEPAM 2 MG/ML IJ SOLN
1.0000 mg | INTRAMUSCULAR | Status: AC | PRN
  Administered 2020-08-08: 3 mg via INTRAVENOUS
  Filled 2020-08-08: qty 2

## 2020-08-08 MED ORDER — CHLORHEXIDINE GLUCONATE 0.12 % MT SOLN: 15.0000 mL | Freq: Once | OROMUCOSAL | Status: AC

## 2020-08-08 MED ORDER — THIAMINE HCL 100 MG/ML IJ SOLN: 100.0000 mg | Freq: Every day | INTRAMUSCULAR | Status: DC

## 2020-08-08 MED ORDER — LACTATED RINGERS IV SOLN: INTRAVENOUS | Status: DC

## 2020-08-08 MED ORDER — SUGAMMADEX SODIUM 200 MG/2ML IV SOLN
INTRAVENOUS | Status: DC | PRN
  Administered 2020-08-08: 200 mg via INTRAVENOUS

## 2020-08-08 NOTE — Anesthesia Preprocedure Evaluation (Signed)
Anesthesia Evaluation  Patient identified by MRN, date of birth, ID band Patient awake    Reviewed: Allergy & Precautions, NPO status , Patient's Chart, lab work & pertinent test results  Airway Mallampati: II  TM Distance: >3 FB     Dental   Pulmonary asthma , Current Smoker and Patient abstained from smoking.,    breath sounds clear to auscultation       Cardiovascular hypertension,  Rhythm:Regular Rate:Normal     Neuro/Psych PSYCHIATRIC DISORDERS Anxiety    GI/Hepatic Neg liver ROS, PUD, GERD  ,  Endo/Other  negative endocrine ROS  Renal/GU negative Renal ROS     Musculoskeletal   Abdominal   Peds  Hematology   Anesthesia Other Findings   Reproductive/Obstetrics                             Anesthesia Physical Anesthesia Plan  ASA: III  Anesthesia Plan: General   Post-op Pain Management:    Induction:   PONV Risk Score and Plan: 1 and Ondansetron, Dexamethasone and Midazolam  Airway Management Planned: LMA  Additional Equipment:   Intra-op Plan:   Post-operative Plan: Extubation in OR  Informed Consent: I have reviewed the patients History and Physical, chart, labs and discussed the procedure including the risks, benefits and alternatives for the proposed anesthesia with the patient or authorized representative who has indicated his/her understanding and acceptance.     Dental advisory given  Plan Discussed with: CRNA and Anesthesiologist  Anesthesia Plan Comments:         Anesthesia Quick Evaluation

## 2020-08-08 NOTE — Progress Notes (Signed)
Neurology Progress Note  S: No overnight events. He says he is feeling better. His legs are not as weak as on admission. States he has been ambulating with walker with PT. No LE edema now. No neck stiffness or pain. He states that his vision can appear dim at times, but quickly normalizes. No blurry or double vision. No flashing lights. He has had no further changes in his mental status, paraesthesias, visual or auditory hallucinations. He is anxious about his MRIs and LP.   ID has seen patient and are holding off on treatment (ARVs) at this time until LP and MRIs are back.   In review of chart, his K+ is back to normal at 4.1. See below for additional testing/labs.   O: Current vital signs: BP (!) 145/89 (BP Location: Left Arm)   Pulse 93   Temp 97.8 F (36.6 C) (Oral)   Resp 17   Ht 5' 10" (1.778 m)   Wt 82.8 kg   SpO2 100%   BMI 26.19 kg/m  Vital signs in last 24 hours: Temp:  [97.8 F (36.6 C)-98.8 F (37.1 C)] 97.8 F (36.6 C) (03/01 0820) Pulse Rate:  [86-94] 93 (03/01 0820) Resp:  [17-18] 17 (03/01 0820) BP: (136-147)/(83-106) 145/89 (03/01 0820) SpO2:  [100 %] 100 % (03/01 0820)  GENERAL: Awake, alert in NAD HEENT: Normocephalic and atraumatic. No pain with palpation of cervical spine nor with flexion of neck.  LUNGS: Normal respiratory effort.  CV: RRR. ABDOMEN: Soft, nontender Ext: warm Psych: calm, cooperative. Affect appropriate to situation.   NEURO:  Mental Status: AA&Ox3  Speech/Language: speech is without aphasia or dysarthria.  Naming, repetition, fluency, and comprehension intact. He is not distracted today.   Cranial Nerves:  II: PERRL 5mm. Visual fields full.  III, IV, VI: EOMI. Eyelids elevate symmetrically.  V: Sensation is intact to light touch and symmetrical to face.  VII: Smile is symmetrical. Able to puff cheeks and raise eyebrows.  VIII: hearing intact to voice. IX, X: Palate elevates symmetrically. Phonation is normal.  XI:Shoulder shrug  5/5. XII: tongue is midline without fasciculations. Motor: 5/5 strength to UEs. 5/5 to hip abductors and thighs. 4/5 dorsiflexion/plantar flexion.   Tone: is normal and bulk is normal Sensation- Intact to light touch bilaterally. Extinction absent to light touch to DSS.    Coordination: FTN intact bilaterally, HKS: no ataxia in BLE. No drift.  DTRs: 1+ UEs.  Gait- deferred  Medications  Current Facility-Administered Medications:  .  carvedilol (COREG) tablet 6.25 mg, 6.25 mg, Oral, BID WC, Akula, Vijaya, MD, 6.25 mg at 08/07/20 1800 .  folic acid (FOLVITE) tablet 1 mg, 1 mg, Oral, Daily, Akula, Vijaya, MD, 1 mg at 08/07/20 0953 .  Gerhardt's butt cream, , Topical, TID, Akula, Vijaya, MD .  lip balm (CARMEX) ointment, , Topical, PRN, Akula, Vijaya, MD, Given at 08/07/20 1024 .  loperamide (IMODIUM) capsule 2 mg, 2 mg, Oral, PRN, Akula, Vijaya, MD, 2 mg at 08/07/20 1913 .  multivitamin with minerals tablet 1 tablet, 1 tablet, Oral, Daily, Akula, Vijaya, MD, 1 tablet at 08/07/20 0952 .  ondansetron (ZOFRAN) tablet 4 mg, 4 mg, Oral, Q6H PRN **OR** ondansetron (ZOFRAN) injection 4 mg, 4 mg, Intravenous, Q6H PRN, Akula, Vijaya, MD .  sertraline (ZOLOFT) tablet 100 mg, 100 mg, Oral, Daily, Akula, Vijaya, MD, 100 mg at 08/07/20 0953 .  thiamine tablet 100 mg, 100 mg, Oral, Daily, 100 mg at 08/07/20 0952 **OR** thiamine (B-1) injection 100 mg, 100 mg, Intravenous,   Daily, Hosie Poisson, MD  Pertinent Labs CK 376     Hgb 7.9    WBCC 8.1    ESR 30    TSH 3.383   Cryptococcal antigen negative.   GI panel negative.  CSF studies pending. TB Quantiferon gold pending.   Imaging is pending  Assessment: 30 yo male with history of non adherence with HIV treatment, hx of syphilis, Varicella with non adherence to Valtrex, among others. His non adherence seems to be related to anxiety with seeking treatment and having no medical insurance. Has not seen ID since 2020. He presented with BLE weakness, gait  difficulty, altered mental status thought to be delirium, and paresthesias. His LE weakness is improving. We are awaiting MRIs of brain, Cspine, Tspine, and Lspine, and also LP which all will be done under anesthesia, hopefully today. ID is holding HIV treatment until tests are returned. He is afebrile.   Differentials are broad, including crypotoccal meningitis but Ag titer negative, CNS infection or viral infection of spine, HSV, CMV, VZV, EBV, JC-but presenting symptoms do not match usual presentation, CNS infection/virus or other encephalopathy such as HIV associated TB.   Plan: 1. Delirium on admission, resolved. 2. HIV with non adherence to appointments and medications. ID following. Holding off on treament with ARVs until testing returned.  3. F/up CSF testing with LP under anesthesia.  4. F/up spine and brain MRIs also to be done under anesthesia.  5. F/up Tb quantiferon gold pending.  6. For leg weakness, continue PT. This seems to be improving as well.  7. For diarrhea, stool studies have been negative.  8. Hypokalemia on admission has resolved.    Pt seen by Clance Boll, MSN, APN-BC/Nurse Practitioner/Neuro and later by MD. Note and plan to be edited as needed by MD.  Pager: [phone]    I have seen the patient reviewed the above note.  He has significant lower extremity weakness in a distal greater than proximal distribution, mildly decreased sensory distally around his feet, and depressed reflexes bilaterally.  My suspicion is for a neuropathic process, but especially given his immune compromised state, I would favor extensive imaging of his neuraxis given his confusion, and lower extremity weakness.  I think further evaluation with LP as well would be prudent.  Acute HIV can cause neuropathy with significant pleocytosis, there are more chronic forms of HIV neuropathy as well, but not usually a sending paralysis.  Guillain-Barr syndrome is certainly in the differential,  and if he has an elevated protein, I would favor considering IVIG.  Appreciate infectious disease, and will perform LP today under a seizure.  Roland Rack, MD [facility] [phone]  If 7pm- 7am, please page neurology on call as listed in [place].

## 2020-08-08 NOTE — Progress Notes (Signed)
Subjective: No new complaints   Antibiotics:  Anti-infectives (From admission, onward)   Start     Dose/Rate Route Frequency Ordered Stop   08/04/20 1000  abacavir-dolutegravir-lamiVUDine (TRIUMEQ) 600-50-300 MG per tablet 1 tablet  Status:  Discontinued        1 tablet Oral Daily 08/04/20 0820 08/07/20 1044      Medications: Scheduled Meds: . carvedilol  6.25 mg Oral BID WC  . folic acid  1 mg Oral Daily  . Gerhardt's butt cream   Topical TID  . multivitamin with minerals  1 tablet Oral Daily  . sertraline  100 mg Oral Daily  . thiamine  100 mg Oral Daily   Or  . thiamine  100 mg Intravenous Daily   Continuous Infusions: PRN Meds:.lip balm, loperamide, ondansetron **OR** ondansetron (ZOFRAN) IV    Objective: Weight change:  No intake or output data in the 24 hours ending 08/08/20 1121 Blood pressure (!) 145/89, pulse 93, temperature 97.8 F (36.6 C), temperature source Oral, resp. rate 17, height 5\' 10"  (1.778 m), weight 82.8 kg, SpO2 100 %. Temp:  [97.8 F (36.6 C)-98.8 F (37.1 C)] 97.8 F (36.6 C) (03/01 0820) Pulse Rate:  [86-94] 93 (03/01 0820) Resp:  [17-18] 17 (03/01 0820) BP: (136-147)/(83-106) 145/89 (03/01 0820) SpO2:  [100 %] 100 % (03/01 0820)  Physical Exam: Physical Exam Constitutional:      Appearance: He is well-developed and well-nourished.  HENT:     Head: Normocephalic and atraumatic.  Eyes:     Extraocular Movements: Extraocular movements intact and EOM normal.     Conjunctiva/sclera: Conjunctivae normal.   Cardiovascular:     Rate and Rhythm: Normal rate and regular rhythm.  Pulmonary:     Effort: Pulmonary effort is normal. No respiratory distress.     Breath sounds: No wheezing.  Abdominal:     General: There is no distension.     Palpations: Abdomen is soft.  Musculoskeletal:        General: No edema. Normal range of motion.     Cervical back: Normal range of motion and neck supple.  Skin:    General: Skin is warm  and dry.     Coloration: Skin is not pale.     Findings: No erythema or rash.  Neurological:     Mental Status: He is alert and oriented to person, place, and time.  Psychiatric:        Mood and Affect: Mood and affect and mood normal.        Behavior: Behavior normal.        Thought Content: Thought content normal.        Judgment: Judgment normal.      CBC:    BMET Recent Labs    08/06/20 0531 08/07/20 0746  NA 136 136  K 3.1* 4.1  CL 91* 93*  CO2 34* 30  GLUCOSE 100* 76  BUN 15 9  CREATININE 0.75 0.70  CALCIUM 8.3* 8.5*     Liver Panel  Recent Labs    08/06/20 1712  PROT 5.9*  ALBUMIN 2.5*  AST 61*  ALT 22  ALKPHOS 82  BILITOT 1.2  BILIDIR 0.3*  IBILI 0.9       Sedimentation Rate Recent Labs    08/07/20 0746  ESRSEDRATE 30*   C-Reactive Protein Recent Labs    08/07/20 0746  CRP 0.7    Micro Results: Recent Results (from the past 720 hour(s))  SARS  CORONAVIRUS 2 (TAT 6-24 HRS) Nasopharyngeal Nasopharyngeal Swab     Status: None   Collection Time: 08/03/20 10:00 PM   Specimen: Nasopharyngeal Swab  Result Value Ref Range Status   SARS Coronavirus 2 NEGATIVE NEGATIVE Final    Comment: (NOTE) SARS-CoV-2 target nucleic acids are NOT DETECTED.  The SARS-CoV-2 RNA is generally detectable in upper and lower respiratory specimens during the acute phase of infection. Negative results do not preclude SARS-CoV-2 infection, do not rule out co-infections with other pathogens, and should not be used as the sole basis for treatment or other patient management decisions. Negative results must be combined with clinical observations, patient history, and epidemiological information. The expected result is Negative.  Fact Sheet for Patients: HairSlick.no  Fact Sheet for Healthcare Providers: quierodirigir.com  This test is not yet approved or cleared by the [place] FDA and  has been  authorized for detection and/or diagnosis of SARS-CoV-2 by FDA under an Emergency Use Authorization (EUA). This EUA will remain  in effect (meaning this test can be used) for the duration of the COVID-19 declaration under Se ction 564(b)(1) of the Act, 21 U.S.C. section 360bbb-3(b)(1), unless the authorization is terminated or revoked sooner.  Performed at [facility], [address], [place], [place] [address]   Gastrointestinal Panel by PCR , Stool     Status: None   Collection Time: 08/06/20  4:59 PM   Specimen: Stool  Result Value Ref Range Status   Campylobacter species NOT DETECTED NOT DETECTED Final   Plesimonas shigelloides NOT DETECTED NOT DETECTED Final   Salmonella species NOT DETECTED NOT DETECTED Final   Yersinia enterocolitica NOT DETECTED NOT DETECTED Final   Vibrio species NOT DETECTED NOT DETECTED Final   Vibrio cholerae NOT DETECTED NOT DETECTED Final   Enteroaggregative E coli (EAEC) NOT DETECTED NOT DETECTED Final   Enteropathogenic E coli (EPEC) NOT DETECTED NOT DETECTED Final   Enterotoxigenic E coli (ETEC) NOT DETECTED NOT DETECTED Final   Shiga like toxin producing E coli (STEC) NOT DETECTED NOT DETECTED Final   Shigella/Enteroinvasive E coli (EIEC) NOT DETECTED NOT DETECTED Final   Cryptosporidium NOT DETECTED NOT DETECTED Final   Cyclospora cayetanensis NOT DETECTED NOT DETECTED Final   Entamoeba histolytica NOT DETECTED NOT DETECTED Final   Giardia lamblia NOT DETECTED NOT DETECTED Final   Adenovirus F40/41 NOT DETECTED NOT DETECTED Final   Astrovirus NOT DETECTED NOT DETECTED Final   Norovirus GI/GII NOT DETECTED NOT DETECTED Final   Rotavirus A NOT DETECTED NOT DETECTED Final   Sapovirus (I, II, IV, and V) NOT DETECTED NOT DETECTED Final    Comment: Performed at [facility], [address], [place], [place] [address]    Studies/Results: DG Abd 1 View  Result Date: 08/07/2020 CLINICAL DATA:  MRI clearance. EXAM: ABDOMEN - 1 VIEW  COMPARISON:  None. FINDINGS: No radiopaque foreign body. Nonobstructive bowel gas pattern. No supine evidence of pneumoperitoneum. No pneumatosis or portal venous gas. No definitive abnormal intra-abdominal calcifications. Limited visualization of lower thorax is normal given technique. Punctate bone island is seen within the left femoral neck. No acute osseous abnormalities. IMPRESSION: No radiopaque foreign body.  OK to proceed to MRI. Electronically Signed   By: Simonne Come M.D.   On: 08/07/2020 07:41      Assessment/Plan:  INTERVAL HISTORY: patient's cognitive status has improved   Principal Problem:   Encephalopathy Active Problems:   GAD (generalized anxiety disorder)   Essential hypertension, benign   Gastroesophageal reflux  disease without esophagitis   AIDS (acquired immune deficiency syndrome) (HCC)   Hypokalemia    Harold Flores is a 30 y.o. male with  HIV, AIDS with loss of strength in his lower extremities gait instability and now numbness in his hands as well as feet encephalopathy waxing and waning neurological status.  I agree with LARGE VOLUME LP AND would recommend  Filling all of the FOUR TUBES with CSF  Please document opening and closing pressure CSF for cell count and diff (I only think need one of these personally) CSF protein, glucose CSF crypto ag CSF for VDRL CSF forToxoplasma by PCR CSF for CMV by PCR CSF for JC Virus  CSF for VZV and HSV   CSF for MTB by PCR  Re the AFB and fungal cultures it is helpful to consider spin down dedicated 8-10 ml and send sediment for culture and save supernatant  CSF routine culture reasonable as well  Save all CSF  MRI of the brain is also critical  Hold ARV and antibiotics  I spent greater than 35 minutes with the patient including greater than 50% of time in face to face counsel of the patient and father and in review of his labs       LOS: 5 days   Acey Lav 08/08/2020, 11:21 AM

## 2020-08-08 NOTE — Progress Notes (Signed)
Dr. Amada Jupiter states that he has a phone consent for LP from mother.

## 2020-08-08 NOTE — Progress Notes (Signed)
Called by RN that Harold Flores is fused, agitated and keeps coming at the nurses station and calling [phone] saying that he wants to leave.  Patient is confused and not able to make coherent decision.  He was admitted with encephalopathy and seen by neurology and infectious disease.  Work-up is still in progress.  Patient has history of heavy alcohol use CIWA scores were ordered but no Ativan was ordered with the CIWA.  There are states they have not been doing the CIWA scores I have ordered the Ativan based on CIWA protocol and asked nurses to do CIWA scores regularly.  Ativan will be dosed upon CIWA scores

## 2020-08-08 NOTE — Transfer of Care (Signed)
Immediate Anesthesia Transfer of Care Note  Patient: Harold Flores  Procedure(s) Performed: MRI WITH ANESTHESIA- BRAIN WITH AND WITHOUT CONTRAST,  CERVICAL SPINE WITH AND WITHOUT CONTRAST, LUMBAR SPINE WITH WITHOUT CONTRAST, THORACIC SPINE WITH WITHOUT CONTRAST (N/A )  Patient Location: PACU  Anesthesia Type:General  Level of Consciousness: awake, alert  and oriented  Airway & Oxygen Therapy: Patient Spontanous Breathing and Patient connected to nasal cannula oxygen  Post-op Assessment: Report given to RN and Post -op Vital signs reviewed and stable  Post vital signs: Reviewed and stable  Last Vitals:  Vitals Value Taken Time  BP 153/101 08/08/20 1717  Temp    Pulse 99 08/08/20 1719  Resp 26 08/08/20 1719  SpO2 98 % 08/08/20 1719  Vitals shown include unvalidated device data.  Last Pain:  Vitals:   08/08/20 0820  TempSrc: Oral  PainSc: 0-No pain         Complications: No complications documented.

## 2020-08-08 NOTE — Anesthesia Postprocedure Evaluation (Signed)
Anesthesia Post Note  Patient: Harold Flores  Procedure(s) Performed: MRI WITH ANESTHESIA- BRAIN WITH AND WITHOUT CONTRAST,  CERVICAL SPINE WITH AND WITHOUT CONTRAST, LUMBAR SPINE WITH WITHOUT CONTRAST, THORACIC SPINE WITH WITHOUT CONTRAST (N/A )     Patient location during evaluation: PACU Anesthesia Type: General Level of consciousness: awake and alert Pain management: pain level controlled Vital Signs Assessment: post-procedure vital signs reviewed and stable Respiratory status: spontaneous breathing, nonlabored ventilation, respiratory function stable and patient connected to nasal cannula oxygen Cardiovascular status: blood pressure returned to baseline and stable Postop Assessment: no apparent nausea or vomiting Anesthetic complications: no   No complications documented.  Last Vitals:  Vitals:   08/08/20 1800 08/08/20 1832  BP: (!) 144/92 (!) 148/102  Pulse: (!) 106 95  Resp: (!) 25 20  Temp: 36.4 C 36.9 C  SpO2: 100% 100%    Last Pain:  Vitals:   08/08/20 2001  TempSrc:   PainSc: 0-No pain                 Earl Lites P Fronnie Urton

## 2020-08-08 NOTE — Progress Notes (Signed)
PROGRESS NOTE    Harold Flores   DVV:[id number]  DOB: 1991/01/26  DOA: 08/03/2020     5  PCP: Etta Grandchild, MD  CC:   Hospital Course:  30 year old gentleman prior history of HIV, noncompliant to medications, essential hypertension, alcohol abuse, generalized anxiety disorder, GERD recently had lower extremity edema of unclear etiology and was started on  Lasix following which patient reports not feeling well with generalized weakness.   The swelling improved after Lasix but on arrival to ED he was found to have's significantly low potassium.  He was admitted to [facility] for further evaluation and management of severe hypokalemia and lower extremity edema.    On further discussions with the patient's husband, his symptoms have been going on since one month. He has been non compliant to his HIV meds.  He had then developed multiple loose stools with intermittent confusion and hallucinations.  ID was consulted given his confusion and concern for possible underlying infectious etiology.  He was recommended for undergoing full back MRI as well as brain MRI and LP.   Interval History:  Seen this morning.  His mentation was improved compared to yesterday however his thought process still appeared slow.  He also confirmed his ongoing daily drinking at home.  ROS: Constitutional: negative for chills and fevers, Respiratory: negative for cough, Cardiovascular: negative for chest pain and Gastrointestinal: negative for abdominal pain  Assessment & Plan:  Lower extremity weakness AMS - Unable to ambulate for more than a month PTA - MRI of the brain and whole spine ordered after discussing with Dr Wilford Corner  in view of his HIV and syphilis history.  - Dr Wilford Corner recommended getting spinal tap to evaluate for infection.  Orders placed .  - IR consult  In place for spinal tap and CSF to be sent for cell count, cytology, glucose, protein,  Vdrl, JC virus PCR, cryptococcus antigen.  Neurology  consulted .  In view of his anxiety and intermittent metabolic and toxic encephalopathy, MRI recommended for the tests to be done under general anesthesia.  Acute metabolic encephalopathy - patient symptoms include delirium, hallucinations - etiology considered due to metabolic possibly from infectious source however still may be due to etoh use as well - follow up B1 level   HIV - Non compliant to meds.  CD4 absolute count is 40, HIV RNA 104,000  - ART on hold - ID following  Normocytic anemia - downtrend from 2021 (prior 13-15 g/dL), now downtrending further - check iron studies, B12, folate - FOBT H/o Syphilis:  Unclear if he has completed treatment.   Severe Hypokalemia: suspect from ongoing loose stools/ diarrhea.   Replaced.   Diarrhea/ Loose stools  - possibly from etoh w/d, seems to have improved some GI pathogen negative    Hypertension:  Sub optimally controlled. On coreg.   GERD. Stable.    Hypomagnesemia:  Replaced.   H/o alcohol abuse: reports drinking more than 7 per day.  Continue with CIWA.  Has bouts of confusion and hallucinations  Intermittently,   ? Withdrawal symptoms.   Generalized anxiety disorder:  Restart sertraline.   Lower extremity edema.  Resolved.   Hyponatremia:  From diuresis.  Improving.   Elevated troponins:  Pt denies any chest pain or sob.  ? From severe hypokalemia , anemia and demand ischemia.  ekg shows sinus tachycardia with  t wave inversions in the lateral leads. Echocardiogram no significant abnormalities.  CT angio of the chest negative for PE.  Cardiology consulted for elevated troponins, recommended no further work-up.   Old records reviewed in assessment of this patient  Antimicrobials: N/a  DVT prophylaxis: SCDs Start: 08/03/20 2229   Code Status:   Code Status: Full Code Family Communication: none present  Disposition Plan: Status is: Inpatient  Remains inpatient appropriate  because:Ongoing diagnostic testing needed not appropriate for outpatient work up, IV treatments appropriate due to intensity of illness or inability to take PO and Inpatient level of care appropriate due to severity of illness   Dispo: The patient is from: Home              Anticipated d/c is to: Home              Patient currently is not medically stable to d/c.   Difficult to place patient No Risk of unplanned readmission score: Unplanned Admission- Pilot do not use: 10.43   Objective: Blood pressure (!) 145/89, pulse 93, temperature 97.8 F (36.6 C), temperature source Oral, resp. rate 17, height 5\' 10"  (1.778 m), weight 82.8 kg, SpO2 100 %.  Examination: General appearance: alert, cooperative, no distress and slowed mentation Head: Normocephalic, without obvious abnormality, atraumatic Eyes: EOMI Lungs: clear to auscultation bilaterally Heart: regular rate and rhythm and S1, S2 normal Abdomen: normal findings: bowel sounds normal and soft, non-tender Extremities: no edema Skin: mobility and turgor normal Neurologic: slowed mentation; 4/5 RUE strength. 3+/5 bilateral LE strength. Sensation intact throughout; negative babinski   Consultants:   ID  IR  Procedures:   LP tentative for 3/1  Data Reviewed: I have personally reviewed following labs and imaging studies Results for orders placed or performed during the hospital encounter of 08/03/20 (from the past 24 hour(s))  MRSA PCR Screening     Status: None   Collection Time: 08/08/20  5:34 AM   Specimen: Nasopharyngeal  Result Value Ref Range   MRSA by PCR NEGATIVE NEGATIVE    Recent Results (from the past 240 hour(s))  SARS CORONAVIRUS 2 (TAT 6-24 HRS) Nasopharyngeal Nasopharyngeal Swab     Status: None   Collection Time: 08/03/20 10:00 PM   Specimen: Nasopharyngeal Swab  Result Value Ref Range Status   SARS Coronavirus 2 NEGATIVE NEGATIVE Final    Comment: (NOTE) SARS-CoV-2 target nucleic acids are NOT  DETECTED.  The SARS-CoV-2 RNA is generally detectable in upper and lower respiratory specimens during the acute phase of infection. Negative results do not preclude SARS-CoV-2 infection, do not rule out co-infections with other pathogens, and should not be used as the sole basis for treatment or other patient management decisions. Negative results must be combined with clinical observations, patient history, and epidemiological information. The expected result is Negative.  Fact Sheet for Patients: 08/05/20  Fact Sheet for Healthcare Providers: HairSlick.no  This test is not yet approved or cleared by the quierodirigir.com FDA and  has been authorized for detection and/or diagnosis of SARS-CoV-2 by FDA under an Emergency Use Authorization (EUA). This EUA will remain  in effect (meaning this test can be used) for the duration of the COVID-19 declaration under Se ction 564(b)(1) of the Act, 21 U.S.C. section 360bbb-3(b)(1), unless the authorization is terminated or revoked sooner.  Performed at [facility], [address] [place], [place]   Gastrointestinal Panel by PCR , Stool     Status: None   Collection Time: 08/06/20  4:59 PM   Specimen: Stool  Result Value Ref Range Status   Campylobacter species NOT DETECTED NOT  DETECTED Final   Plesimonas shigelloides NOT DETECTED NOT DETECTED Final   Salmonella species NOT DETECTED NOT DETECTED Final   Yersinia enterocolitica NOT DETECTED NOT DETECTED Final   Vibrio species NOT DETECTED NOT DETECTED Final   Vibrio cholerae NOT DETECTED NOT DETECTED Final   Enteroaggregative E coli (EAEC) NOT DETECTED NOT DETECTED Final   Enteropathogenic E coli (EPEC) NOT DETECTED NOT DETECTED Final   Enterotoxigenic E coli (ETEC) NOT DETECTED NOT DETECTED Final   Shiga like toxin producing E coli (STEC) NOT DETECTED NOT DETECTED Final   Shigella/Enteroinvasive E coli (EIEC)  NOT DETECTED NOT DETECTED Final   Cryptosporidium NOT DETECTED NOT DETECTED Final   Cyclospora cayetanensis NOT DETECTED NOT DETECTED Final   Entamoeba histolytica NOT DETECTED NOT DETECTED Final   Giardia lamblia NOT DETECTED NOT DETECTED Final   Adenovirus F40/41 NOT DETECTED NOT DETECTED Final   Astrovirus NOT DETECTED NOT DETECTED Final   Norovirus GI/GII NOT DETECTED NOT DETECTED Final   Rotavirus A NOT DETECTED NOT DETECTED Final   Sapovirus (I, II, IV, and V) NOT DETECTED NOT DETECTED Final    Comment: Performed at [facility], [address] [place], [place] [address]  MRSA PCR Screening     Status: None   Collection Time: 08/08/20  5:34 AM   Specimen: Nasopharyngeal  Result Value Ref Range Status   MRSA by PCR NEGATIVE NEGATIVE Final    Comment:        The GeneXpert MRSA Assay (FDA approved for NASAL specimens only), is one component of a comprehensive MRSA colonization surveillance program. It is not intended to diagnose MRSA infection nor to guide or monitor treatment for MRSA infections. Performed at [facility], [address] [place], [place] [address]      Radiology Studies: DG Abd 1 View  Result Date: 08/07/2020 CLINICAL DATA:  MRI clearance. EXAM: ABDOMEN - 1 VIEW COMPARISON:  None. FINDINGS: No radiopaque foreign body. Nonobstructive bowel gas pattern. No supine evidence of pneumoperitoneum. No pneumatosis or portal venous gas. No definitive abnormal intra-abdominal calcifications. Limited visualization of lower thorax is normal given technique. Punctate bone island is seen within the left femoral neck. No acute osseous abnormalities. IMPRESSION: No radiopaque foreign body.  OK to proceed to MRI. Electronically Signed   By: Simonne ComeJohn  Watts M.D.   On: 08/07/2020 07:41   EEG adult  Result Date: 08/08/2020 Charlsie QuestYadav, Priyanka O, MD     08/08/2020 12:15 PM Patient Name: Cordelia Pocheaylor A Skow MRN: [id number] Epilepsy Attending: Charlsie QuestPriyanka O Yadav Referring  Physician/Provider: Jimmye NormanKaren Kirby-Graham, NP Date: 08/08/2020 Duration: 25.19 minutes Patient history: 30 year old male with altered mental status.  EEG to evaluate for seizures. Level of alertness: Awake AEDs during EEG study: Technical aspects: This EEG study was done with scalp electrodes positioned according to the 10-20 International system of electrode placement. Electrical activity was acquired at a sampling rate of 500Hz  and reviewed with a high frequency filter of 70Hz  and a low frequency filter of 1Hz . EEG data were recorded continuously and digitally stored. Description: The posterior dominant rhythm consists of 9-10 Hz activity of moderate voltage (25-35 uV) seen predominantly in posterior head regions, symmetric and reactive to eye opening and eye closing. EEG showed intermittent generalized 5 to 6 Hz theta slowing. Physiologic photic driving was seen during photic stimulation.  Hyperventilation was not performed.   ABNORMALITY -Intermittent slow, generalized IMPRESSION: This study is suggestive of mild diffuse encephalopathy, nonspecific etiology. No seizures or epileptiform discharges were seen throughout the  recording. Priyanka Annabelle Harman   DG Abd 1 View  Final Result    CT ANGIO CHEST PE W OR WO CONTRAST  Final Result    DG Chest Port 1 View  Final Result    DG FL GUIDED LUMBAR PUNCTURE    (Results Pending)  DG FLUORO GUIDE LUMBAR PUNCTURE    (Results Pending)  MR BRAIN W WO CONTRAST    (Results Pending)  MR CERVICAL SPINE W WO CONTRAST    (Results Pending)  MR Lumbar Spine W Wo Contrast    (Results Pending)  MR THORACIC SPINE W WO CONTRAST    (Results Pending)  DG FL GUIDED LUMBAR PUNCTURE    (Results Pending)    Scheduled Meds: . [MAR Hold] carvedilol  6.25 mg Oral BID WC  . [MAR Hold] folic acid  1 mg Oral Daily  . [MAR Hold] Gerhardt's butt cream   Topical TID  . [MAR Hold] multivitamin with minerals  1 tablet Oral Daily  . [MAR Hold] sertraline  100 mg Oral Daily  . [MAR  Hold] thiamine  100 mg Oral Daily   Or  . [MAR Hold] thiamine  100 mg Intravenous Daily   PRN Meds: [MAR Hold] lip balm, [MAR Hold] loperamide, [MAR Hold] ondansetron **OR** [MAR Hold] ondansetron (ZOFRAN) IV Continuous Infusions: . lactated ringers 10 mL/hr at 08/08/20 1424     LOS: 5 days  Time spent: Greater than 50% of the 35 minute visit was spent in counseling/coordination of care for the patient as laid out in the A&P.   Lewie Chamber, MD [facility] 08/08/2020, 3:56 PM

## 2020-08-08 NOTE — Progress Notes (Signed)
PT Cancellation Note  Patient Details Name: Harold Flores MRN: [id number] DOB: 1990/11/09   Cancelled Treatment:    Reason Eval/Treat Not Completed: Patient at procedure or test/unavailable.  Will retry as time and pt allow.   Harold Flores 08/08/2020, 12:44 PM Harold Flores, PT MS Acute Rehab Dept. Number: [facility] [id number] and [facility] [phone]

## 2020-08-08 NOTE — Procedures (Signed)
Patient Name: Harold Flores  MRN: [id number]  Epilepsy Attending: Charlsie Quest  Referring Physician/Provider: Jimmye Norman, NP Date: 08/08/2020 Duration: 25.19 minutes  Patient history: 30 year old male with altered mental status.  EEG to evaluate for seizures.  Level of alertness: Awake  AEDs during EEG study:   Technical aspects: This EEG study was done with scalp electrodes positioned according to the 10-20 International system of electrode placement. Electrical activity was acquired at a sampling rate of 500Hz  and reviewed with a high frequency filter of 70Hz  and a low frequency filter of 1Hz . EEG data were recorded continuously and digitally stored.   Description: The posterior dominant rhythm consists of 9-10 Hz activity of moderate voltage (25-35 uV) seen predominantly in posterior head regions, symmetric and reactive to eye opening and eye closing. EEG showed intermittent generalized 5 to 6 Hz theta slowing. Physiologic photic driving was seen during photic stimulation.  Hyperventilation was not performed.     ABNORMALITY -Intermittent slow, generalized  IMPRESSION: This study is suggestive of mild diffuse encephalopathy, nonspecific etiology. No seizures or epileptiform discharges were seen throughout the recording.  Nora Sabey 

## 2020-08-08 NOTE — Progress Notes (Signed)
EEG complete - results pending 

## 2020-08-08 NOTE — Plan of Care (Signed)
NP thought that patient may be able to tolerate MRIs/LP with sedation (given his delirium was resolved on exam today) instead of general anesthesia. However, his mental status is waxing and waning on further talking to patient. He would rather be asleep, so will continue general anesthesia due to length of exam, anxiety, and patient wishes.  Jimmye Norman, NP Neurology

## 2020-08-08 NOTE — Progress Notes (Signed)
Occupational Therapy Treatment Patient Details Name: Harold Flores MRN: [id number] DOB: [birth year] Today's Date: 08/08/2020    History of present illness 30 year old gentleman prior history of HIV, noncompliant to medications, essential hypertension, alcohol abuse, generalized anxiety disorder, GERD recently had lower extremity edema of unclear etiology and was started on  Lasix, Admitted 08/03/20 with progressive weakness , significantly low potassium of 2.   OT comments  Pt agreeable and pleasant throughout session, continues with deficits in sensation (tingling and burning reports of B hands<B feet), strength and activity tolerance. Ox2 this date, with noted slowed processing with following commands although effort was good. Improved functional performance at bed level requiring Sup for repositioning and min A for supine<>sitting. trialed transfer training in prep for participation with other self care/ADL tasks, tolerated x2 STS from EOB with max A and cues for sequencing and technique, limited tolerance for standing and slight posterior lean. Demo's need for max A for LB ADL's sitting, CGA for seated grooming. During dynamic balance activities pt able to correct slight LOB laterally with reaching outside of base of support. OT will continue to follow acutely, with recommendations below.   Follow Up Recommendations  CIR;Supervision/Assistance - 24 hour    Equipment Recommendations  3 in 1 bedside commode    Recommendations for Other Services Rehab consult    Precautions / Restrictions Precautions Precautions: Fall Precaution Comments: has had  several falls, distal weakness in arms and legs. endorses multiple shoulder dislocations prior to admission Restrictions Weight Bearing Restrictions: No       Mobility Bed Mobility   Bed Mobility: Supine to Sit;Sit to Supine;Rolling Rolling: Supervision (B direction)   Supine to sit: Min assist;HOB elevated Sit to supine: Min assist  (increased time, reliance on rails and cues for sequencing, increased A for LE's required)   General bed mobility comments: increased A for trunk control on transition to sitting EOB, and min A for LE's back to bed    Transfers     Transfers: Lateral/Scoot Transfers;Sit to/from Stand Sit to Stand: Max assist        Lateral/Scoot Transfers: Mod assist General transfer comment: cues for posture with all attempts at transition to standing, cues for hand/foot placement and trunk tucking for improved upright posture. decreased tolerance for status standing this date wtih heavy reliance on therapist for statuc balance.    Balance   Sitting-balance support: Feet supported;Bilateral upper extremity supported Sitting balance-Leahy Scale: Fair Sitting balance - Comments: with onset of fatigue increased reliance on UE support for trunk control and posture at EOB; able to toelrate dynamic balance graded tasks wtih CGA and slowed speed                                   ADL either performed or assessed with clinical judgement   ADL       Grooming: Set up   Upper Body Bathing: Set up;Cueing for sequencing;Sitting                             General ADL Comments: brief bouts of grooming and UB ADL's seated EOB increased time with observed swaying with decreased trunk strength and control.     Vision       Perception     Praxis      Cognition Arousal/Alertness: Awake/alert Behavior During Therapy: WFL for tasks assessed/performed Overall Cognitive Status:  Impaired/Different from baseline Area of Impairment: Orientation;Memory;Following commands;Awareness;Safety/judgement                 Orientation Level: Place;Situation   Memory: Decreased short-term memory Following Commands: Follows one step commands inconsistently Safety/Judgement: Decreased awareness of deficits Awareness: Emergent   General Comments: continues with slowed processing,  single step command        Exercises     Shoulder Instructions       General Comments      Pertinent Vitals/ Pain       Pain Assessment: Faces Faces Pain Scale: Hurts a little bit Pain Location: gluteal folds with excoriation Pain Descriptors / Indicators: Burning;Discomfort Pain Intervention(s): Limited activity within patient's tolerance  Home Living                                          Prior Functioning/Environment              Frequency  Min 2X/week        Progress Toward Goals  OT Goals(current goals can now be found in the care plan section)  Progress towards OT goals: Progressing toward goals  Acute Rehab OT Goals Patient Stated Goal: to get better OT Goal Formulation: With patient Time For Goal Achievement: 08/20/20 Potential to Achieve Goals: Good  Plan Discharge plan remains appropriate    Co-evaluation                 AM-PAC OT "6 Clicks" Daily Activity     Outcome Measure   Help from another person eating meals?: None Help from another person taking care of personal grooming?: A Little Help from another person toileting, which includes using toliet, bedpan, or urinal?: Total Help from another person bathing (including washing, rinsing, drying)?: A Lot Help from another person to put on and taking off regular upper body clothing?: A Little Help from another person to put on and taking off regular lower body clothing?: Total 6 Click Score: 14    End of Session Equipment Utilized During Treatment: Gait belt  OT Visit Diagnosis: Other abnormalities of gait and mobility (R26.89);History of falling (Z91.81);Muscle weakness (generalized) (M62.81);Other symptoms and signs involving the nervous system (R29.898);Other symptoms and signs involving cognitive function   Activity Tolerance Patient tolerated treatment well   Patient Left in bed;with call bell/phone within reach;with bed alarm set;with family/visitor  present   Nurse Communication Mobility status    Time: 0912-0951 OT Time Calculation (min): 39 min  Charges: OT General Charges $OT Visit: 1 Visit OT Treatments $Self Care/Home Management : 8-22 mins $Therapeutic Activity: 23-37 mins  Harold Flores OTR/L acute rehab services Office: [phone]  08/08/2020, 1:02 PM

## 2020-08-08 NOTE — Anesthesia Procedure Notes (Signed)
Procedure Name: Intubation Date/Time: 08/08/2020 2:47 PM Performed by: Dorthea Cove, CRNA Pre-anesthesia Checklist: Patient identified, Emergency Drugs available, Suction available and Patient being monitored Patient Re-evaluated:Patient Re-evaluated prior to induction Oxygen Delivery Method: Circle System Utilized Preoxygenation: Pre-oxygenation with 100% oxygen Induction Type: IV induction Ventilation: Mask ventilation without difficulty Laryngoscope Size: Mac and 4 Grade View: Grade II Tube type: Oral Tube size: 7.5 mm Number of attempts: 1 Airway Equipment and Method: Stylet and Oral airway Placement Confirmation: ETT inserted through vocal cords under direct vision,  positive ETCO2 and breath sounds checked- equal and bilateral Secured at: 23 cm Tube secured with: Tape Dental Injury: Teeth and Oropharynx as per pre-operative assessment

## 2020-08-08 NOTE — Consult Note (Addendum)
WOC Nurse Consult Note: Reason for Consult: Incontinence associated dermatitis (IAD) and Intertriginous dermatitis (ITD)  ICD 10-CM codes for Irritant Dermatitis: L24A2 - Due to fecal, urinary or dual incontinence L30.4  - Erythema intertrigo  Wound type:Moisture associated skin damage Pressure Injury POA: N/A Measurement:N/AIrritant Dermatitis Wound bed:N/A Drainage (amount, consistency, odor) None Periwound: Intact Dressing procedure/placement/frequency: Macerated skin in a diffuse presentation due to frequent incontinence will be addressed with a mattress replacement with low air loss feature and Gerhart's Butt Cream, a 1:1:1 compounded preparation of hydrocortisone cream:lotrimin:zinc oxide. Bilateral heel boots for pressure redistribution as well as a chair cushion for his OOB use are provided.  WOC nursing team will not follow, but will remain available to this patient, the nursing and medical teams.  Please re-consult if needed. Thanks, Ladona Mow, MSN, RN, GNP, [place]  Pager# [phone]

## 2020-08-08 NOTE — Plan of Care (Signed)
?  Problem: Clinical Measurements: ?Goal: Ability to maintain clinical measurements within normal limits will improve ?Outcome: Progressing ?Goal: Will remain free from infection ?Outcome: Progressing ?Goal: Diagnostic test results will improve ?Outcome: Progressing ?  ?

## 2020-08-08 NOTE — Procedures (Signed)
Indication: Lower extremity weakness, altered mental status  Risks of the procedure were dicussed with the patient including post-LP headache, bleeding, infection, weakness/numbness of legs(radiculopathy).  The patient's mother agreed and phone consent was obtained.   Given the patient's previous agitation in fact that he was already having an MRI with anesthesia, the LP was coordinated to be done prior to waking him up from anesthesia.  The patient was prepped and draped, and using sterile technique a 20 gauge quinke spinal needle was inserted in the L4-5 space. The opening pressure was 6 cm H2O. Approximately 15 cc of CSF were obtained and sent for analysis.  Given the lack of elevated initial opening pressure, closing pressure was not obtained.  Ritta Slot, MD [facility] [phone]  If 7pm- 7am, please page neurology on call as listed in AMION.

## 2020-08-09 ENCOUNTER — Encounter (HOSPITAL_COMMUNITY): Payer: Self-pay | Admitting: Radiology

## 2020-08-09 DIAGNOSIS — F1010 Alcohol abuse, uncomplicated: Secondary | ICD-10-CM

## 2020-08-09 DIAGNOSIS — D649 Anemia, unspecified: Secondary | ICD-10-CM

## 2020-08-09 DIAGNOSIS — R7989 Other specified abnormal findings of blood chemistry: Secondary | ICD-10-CM

## 2020-08-09 DIAGNOSIS — B20 Human immunodeficiency virus [HIV] disease: Secondary | ICD-10-CM

## 2020-08-09 DIAGNOSIS — E871 Hypo-osmolality and hyponatremia: Secondary | ICD-10-CM

## 2020-08-09 DIAGNOSIS — R197 Diarrhea, unspecified: Secondary | ICD-10-CM

## 2020-08-09 DIAGNOSIS — E538 Deficiency of other specified B group vitamins: Secondary | ICD-10-CM

## 2020-08-09 DIAGNOSIS — R778 Other specified abnormalities of plasma proteins: Secondary | ICD-10-CM

## 2020-08-09 LAB — URINALYSIS, ROUTINE W REFLEX MICROSCOPIC
Bacteria, UA: NONE SEEN
Bilirubin Urine: NEGATIVE
Glucose, UA: NEGATIVE mg/dL
Hgb urine dipstick: NEGATIVE
Ketones, ur: 20 mg/dL — AB
Leukocytes,Ua: NEGATIVE
Nitrite: NEGATIVE
Protein, ur: 30 mg/dL — AB
Specific Gravity, Urine: 1.02 (ref 1.005–1.030)
pH: 8 (ref 5.0–8.0)

## 2020-08-09 LAB — MISC LABCORP TEST (SEND OUT)
Labcorp test code: 54444
Labcorp test code: 138313
Labcorp test code: 138651

## 2020-08-09 LAB — CBC
HCT: 25.3 % — ABNORMAL LOW (ref 39.0–52.0)
Hemoglobin: 9.1 g/dL — ABNORMAL LOW (ref 13.0–17.0)
MCH: 33.2 pg (ref 26.0–34.0)
MCHC: 36 g/dL (ref 30.0–36.0)
MCV: 92.3 fL (ref 80.0–100.0)
Platelets: 296 10*3/uL (ref 150–400)
RBC: 2.74 MIL/uL — ABNORMAL LOW (ref 4.22–5.81)
RDW: 18.7 % — ABNORMAL HIGH (ref 11.5–15.5)
WBC: 9.9 10*3/uL (ref 4.0–10.5)
nRBC: 0 % (ref 0.0–0.2)

## 2020-08-09 LAB — BASIC METABOLIC PANEL
Anion gap: 16 — ABNORMAL HIGH (ref 5–15)
BUN: 11 mg/dL (ref 6–20)
CO2: 25 mmol/L (ref 22–32)
Calcium: 8.9 mg/dL (ref 8.9–10.3)
Chloride: 92 mmol/L — ABNORMAL LOW (ref 98–111)
Creatinine, Ser: 0.97 mg/dL (ref 0.61–1.24)
GFR, Estimated: >60 mL/min (ref >60)
Glucose, Bld: 70 mg/dL (ref 70–99)
Potassium: 4.5 mmol/L (ref 3.5–5.1)
Sodium: 133 mmol/L — ABNORMAL LOW (ref 135–145)

## 2020-08-09 LAB — HSV 1/2 PCR, CSF
HSV-1 DNA: NEGATIVE
HSV-2 DNA: NEGATIVE

## 2020-08-09 LAB — VDRL, CSF: VDRL Quant, CSF: NONREACTIVE

## 2020-08-09 LAB — MAGNESIUM: Magnesium: 1.5 mg/dL — ABNORMAL LOW (ref 1.7–2.4)

## 2020-08-09 LAB — T.PALLIDUM AB, TOTAL: T Pallidum Abs: REACTIVE — AB

## 2020-08-09 LAB — CYTOLOGY - NON PAP

## 2020-08-09 MED ORDER — SODIUM CHLORIDE 0.9 % IV SOLN
5.0000 mg | Freq: Every day | INTRAVENOUS | Status: AC
  Administered 2020-08-09 – 2020-08-13 (×5): 5 mg via INTRAVENOUS
  Filled 2020-08-09 (×6): qty 1

## 2020-08-09 MED ORDER — IMMUNE GLOBULIN (HUMAN) 10 GM/100ML IV SOLN
400.0000 mg/kg | INTRAVENOUS | Status: DC
  Filled 2020-08-09: qty 350

## 2020-08-09 MED ORDER — SODIUM CHLORIDE 0.9 % IV SOLN
5.0000 mg/kg | Freq: Two times a day (BID) | INTRAVENOUS | Status: DC
  Administered 2020-08-09 – 2020-08-11 (×4): 415 mg via INTRAVENOUS
  Filled 2020-08-09 (×6): qty 8.3

## 2020-08-09 MED ORDER — FOLIC ACID 1 MG PO TABS
1.0000 mg | ORAL_TABLET | Freq: Every day | ORAL | Status: DC
  Administered 2020-08-14 – 2020-08-15 (×2): 1 mg via ORAL
  Filled 2020-08-09 (×2): qty 1

## 2020-08-09 MED ORDER — HYDRALAZINE HCL 20 MG/ML IJ SOLN
10.0000 mg | Freq: Once | INTRAMUSCULAR | Status: AC
  Administered 2020-08-09: 10 mg via INTRAVENOUS
  Filled 2020-08-09: qty 1

## 2020-08-09 MED ORDER — AMLODIPINE BESYLATE 10 MG PO TABS
10.0000 mg | ORAL_TABLET | Freq: Every day | ORAL | Status: DC
  Administered 2020-08-09 – 2020-08-15 (×7): 10 mg via ORAL
  Filled 2020-08-09 (×6): qty 1
  Filled 2020-08-09: qty 2

## 2020-08-09 MED ORDER — THIAMINE HCL 100 MG/ML IJ SOLN
500.0000 mg | Freq: Three times a day (TID) | INTRAVENOUS | Status: AC
  Administered 2020-08-10 – 2020-08-11 (×6): 500 mg via INTRAVENOUS
  Filled 2020-08-09 (×6): qty 5

## 2020-08-09 MED ORDER — BICTEGRAVIR-EMTRICITAB-TENOFOV 50-200-25 MG PO TABS
1.0000 | ORAL_TABLET | Freq: Every day | ORAL | Status: DC
  Administered 2020-08-09 – 2020-08-15 (×7): 1 via ORAL
  Filled 2020-08-09 (×7): qty 1

## 2020-08-09 MED ORDER — MAGNESIUM OXIDE 400 (241.3 MG) MG PO TABS
800.0000 mg | ORAL_TABLET | Freq: Once | ORAL | Status: AC
  Administered 2020-08-09: 800 mg via ORAL
  Filled 2020-08-09: qty 2

## 2020-08-09 MED ORDER — SULFAMETHOXAZOLE-TRIMETHOPRIM 800-160 MG PO TABS
1.0000 | ORAL_TABLET | Freq: Every day | ORAL | Status: DC
  Administered 2020-08-09 – 2020-08-15 (×7): 1 via ORAL
  Filled 2020-08-09 (×8): qty 1

## 2020-08-09 MED ORDER — IMMUNE GLOBULIN (HUMAN) 10 GM/100ML IV SOLN
400.0000 mg/kg | INTRAVENOUS | Status: AC
  Administered 2020-08-09 – 2020-08-13 (×5): 35 g via INTRAVENOUS
  Filled 2020-08-09: qty 350
  Filled 2020-08-09: qty 100
  Filled 2020-08-09 (×2): qty 200
  Filled 2020-08-09: qty 100

## 2020-08-09 MED ORDER — MAGNESIUM SULFATE 4 GM/100ML IV SOLN
4.0000 g | Freq: Once | INTRAVENOUS | Status: AC
  Administered 2020-08-09: 4 g via INTRAVENOUS
  Filled 2020-08-09: qty 100

## 2020-08-09 NOTE — Progress Notes (Signed)
Physical Therapy Treatment Patient Details Name: Harold Flores MRN: [id number] DOB: 21-Aug-1990 Today's Date: 08/09/2020    History of Present Illness 30 year old gentleman prior history of HIV, noncompliant to medications, essential hypertension, alcohol abuse, generalized anxiety disorder, GERD recently had lower extremity edema of unclear etiology and was started on  Lasix, Admitted 08/03/20 with progressive weakness , significantly low potassium of 2.    PT Comments    Patient demonstrates B UE and LE ataxia, impaired cognition, and impaired standing balance. Worked on repeated sit to stands with use of STEDY for more controlled motion as RW became unstable. Standing balance in STEDY with one UE support required min guard, cues to limit use of bar to assist with balance. Continue to recommend comprehensive inpatient rehab (CIR) for post-acute therapy needs.    Follow Up Recommendations  CIR     Equipment Recommendations  Wheelchair (measurements PT);Wheelchair cushion (measurements PT);Rolling walker with 5" wheels    Recommendations for Other Services       Precautions / Restrictions Precautions Precautions: Fall Precaution Comments: not safe to leave up in chair Restrictions Weight Bearing Restrictions: No    Mobility  Bed Mobility Overal bed mobility: Needs Assistance Bed Mobility: Sit to Sidelying;Rolling Rolling: Modified independent (Device/Increase time) Sidelying to sit: Supervision     Sit to sidelying: Supervision General bed mobility comments: increased time for bringing LEs back into bed    Transfers Overall transfer level: Needs assistance Equipment used: Rolling walker (2 wheeled);Ambulation equipment used Transfers: Sit to/from Stand Sit to Stand: +2 physical assistance;Mod assist         General transfer comment: stood with RW x 2 from elevated bed with +2 mod assist, pt very unsteady and immediately returns to sitting, worked on sit<>stand with  stedy from bed, chair and stedy platforms  Ambulation/Gait             General Gait Details: unable   Optometrist    Modified Rankin (Stroke Patients Only)       Balance Overall balance assessment: Needs assistance Sitting-balance support: Feet supported;Bilateral upper extremity supported Sitting balance-Leahy Scale: Good Sitting balance - Comments: no LOB with neuro assessment of LEs without UE support   Standing balance support: Bilateral upper extremity supported Standing balance-Leahy Scale: Poor Standing balance comment: reliant on at least one hand on stedy cross bar, attempts to support trunk with cross bar, but is able to stand with min guard assist in stedy                            Cognition Arousal/Alertness: Awake/alert Behavior During Therapy: [facility] for tasks assessed/performed Overall Cognitive Status: Impaired/Different from baseline Area of Impairment: Memory;Safety/judgement;Awareness;Problem solving;Following commands                     Memory: Decreased short-term memory;Decreased recall of precautions Following Commands: Follows one step commands with increased time Safety/Judgement: Decreased awareness of safety;Decreased awareness of deficits Awareness: Emergent Problem Solving: Slow processing General Comments: After placing patient in chair, NT informed therapist that it was not safe to leave in chair. Asked patient, "Is it safe to get into bed by yourself?". He said "yes I can probably do it by myself."      Exercises General Exercises - Lower Extremity Long Arc Quad: Both;5 reps;Seated Hip Flexion/Marching: Both;5 reps;Seated    General Comments  Pertinent Vitals/Pain Pain Assessment: No/denies pain    Home Living                      Prior Function            PT Goals (current goals can now be found in the care plan section) Acute Rehab PT Goals Patient  Stated Goal: to get better PT Goal Formulation: With patient/family Time For Goal Achievement: 08/20/20 Potential to Achieve Goals: Fair Progress towards PT goals: Progressing toward goals    Frequency    Min 3X/week      PT Plan Frequency needs to be updated    Co-evaluation PT/OT/SLP Co-Evaluation/Treatment: Yes Reason for Co-Treatment: For patient/therapist safety;Complexity of the patient's impairments (multi-system involvement);To address functional/ADL transfers PT goals addressed during session: Mobility/safety with mobility;Balance OT goals addressed during session: ADL's and self-care;Strengthening/ROM      AM-PAC PT "6 Clicks" Mobility   Outcome Measure  Help needed turning from your back to your side while in a flat bed without using bedrails?: A Little Help needed moving from lying on your back to sitting on the side of a flat bed without using bedrails?: A Little Help needed moving to and from a bed to a chair (including a wheelchair)?: A Lot Help needed standing up from a chair using your arms (e.g., wheelchair or bedside chair)?: A Lot Help needed to walk in hospital room?: A Lot Help needed climbing 3-5 steps with a railing? : Total 6 Click Score: 13    End of Session Equipment Utilized During Treatment: Gait belt Activity Tolerance: Patient tolerated treatment well Patient left: in bed;with call bell/phone within reach;with bed alarm set Nurse Communication: Mobility status;Need for lift equipment PT Visit Diagnosis: Muscle weakness (generalized) (M62.81);Other symptoms and signs involving the nervous system (R29.898);Repeated falls (R29.6)     Time: 0093-8182 PT Time Calculation (min) (ACUTE ONLY): 31 min  Charges:  $Therapeutic Activity: 8-22 mins                     Alayna A. Dan Humphreys PT, DPT Acute Rehabilitation Services Pager [phone] Office [phone]    Viviann Spare 08/09/2020, 4:25 PM

## 2020-08-09 NOTE — Assessment & Plan Note (Addendum)
-   evening of 3/1 appeared to have further withdrawal symptoms and was given Ativan under CIWA protocol. - completed etoh withdrawal while in hospital; CIWA discontinued -Continue folate, thiamine, multivitamin -Patient states he does not wish to go back to drinking

## 2020-08-09 NOTE — Progress Notes (Signed)
Subjective: No new complaints   Antibiotics:  Anti-infectives (From admission, onward)   Start     Dose/Rate Route Frequency Ordered Stop   08/09/20 1100  bictegravir-emtricitabine-tenofovir AF (BIKTARVY) 50-200-25 MG per tablet 1 tablet        1 tablet Oral Daily 08/09/20 1008     08/04/20 1000  abacavir-dolutegravir-lamiVUDine (TRIUMEQ) 600-50-300 MG per tablet 1 tablet  Status:  Discontinued        1 tablet Oral Daily 08/04/20 0820 08/07/20 1044      Medications: Scheduled Meds: . amLODipine  10 mg Oral Daily  . bictegravir-emtricitabine-tenofovir AF  1 tablet Oral Daily  . carvedilol  6.25 mg Oral BID WC  . folic acid  1 mg Oral Daily  . Gerhardt's butt cream   Topical TID  . multivitamin with minerals  1 tablet Oral Daily  . sertraline  100 mg Oral Daily  . thiamine  100 mg Oral Daily   Or  . thiamine  100 mg Intravenous Daily   Continuous Infusions: PRN Meds:.lip balm, loperamide, LORazepam **OR** LORazepam, ondansetron **OR** ondansetron (ZOFRAN) IV    Objective: Weight change:   Intake/Output Summary (Last 24 hours) at 08/09/2020 1010 Last data filed at 08/08/2020 2300 Gross per 24 hour  Intake 460 ml  Output 400 ml  Net 60 ml   Blood pressure (!) 146/91, pulse (!) 106, temperature 98.6 F (37 C), temperature source Oral, resp. rate 16, height  (1.778 m), weight 82.8 kg, SpO2 97 %. Temp:  [97.6 F (36.4 C)-99 F (37.2 C)] 98.6 F (37 C) (03/02 0804) Pulse Rate:  [95-110] 106 (03/02 0804) Resp:  [16-25] 16 (03/02 0804) BP: (118-153)/(80-108) 146/91 (03/02 0804) SpO2:  [95 %-100 %] 97 % (03/02 0804)  Physical Exam: Physical Exam Constitutional:      Appearance: He is well-developed.  HENT:     Head: Normocephalic and atraumatic.  Eyes:     Extraocular Movements: Extraocular movements intact.     Conjunctiva/sclera: Conjunctivae normal.  Cardiovascular:     Rate and Rhythm: Normal rate and regular rhythm.  Pulmonary:     Effort:  Pulmonary effort is normal. No respiratory distress.     Breath sounds: No wheezing.  Abdominal:     General: There is no distension.     Palpations: Abdomen is soft.  Musculoskeletal:        General: Normal range of motion.     Cervical back: Normal range of motion and neck supple.  Skin:    General: Skin is warm and dry.     Coloration: Skin is not pale.     Findings: No erythema or rash.  Neurological:     Mental Status: He is lethargic.  Psychiatric:        Attention and Perception: Attention normal.        Speech: Speech normal.     Comments: Sleepy this am but arousable and we discussed new ARV for him      CBC:    BMET Recent Labs    08/07/20 0746 08/09/20 0413  NA 136 133*  K 4.1 4.5  CL 93* 92*  CO2 30 25  GLUCOSE 76 70  BUN 9 11  CREATININE 0.70 0.97  CALCIUM 8.5* 8.9     Liver Panel  Recent Labs    08/06/20 1712  PROT 5.9*  ALBUMIN 2.5*  AST 61*  ALT 22  ALKPHOS 82  BILITOT 1.2  BILIDIR 0.3*  IBILI 0.9       Sedimentation Rate Recent Labs    08/07/20 0746  ESRSEDRATE 30*   C-Reactive Protein Recent Labs    08/07/20 0746  CRP 0.7    Micro Results: Recent Results (from the past 720 hour(s))  SARS CORONAVIRUS 2 (TAT 6-24 HRS) Nasopharyngeal Nasopharyngeal Swab     Status: None   Collection Time: 08/03/20 10:00 PM   Specimen: Nasopharyngeal Swab  Result Value Ref Range Status   SARS Coronavirus 2 NEGATIVE NEGATIVE Final    Comment: (NOTE) SARS-CoV-2 target nucleic acids are NOT DETECTED.  The SARS-CoV-2 RNA is generally detectable in upper and lower respiratory specimens during the acute phase of infection. Negative results do not preclude SARS-CoV-2 infection, do not rule out co-infections with other pathogens, and should not be used as the sole basis for treatment or other patient management decisions. Negative results must be combined with clinical observations, patient history, and epidemiological information. The  expected result is Negative.  Fact Sheet for Patients: HairSlick.no  Fact Sheet for Healthcare Providers: quierodirigir.com  This test is not yet approved or cleared by the [place] FDA and  has been authorized for detection and/or diagnosis of SARS-CoV-2 by FDA under an Emergency Use Authorization (EUA). This EUA will remain  in effect (meaning this test can be used) for the duration of the COVID-19 declaration under Se ction 564(b)(1) of the Act, 21 U.S.C. section 360bbb-3(b)(1), unless the authorization is terminated or revoked sooner.  Performed at [facility], [address], [place], [place] [address]   Gastrointestinal Panel by PCR , Stool     Status: None   Collection Time: 08/06/20  4:59 PM   Specimen: Stool  Result Value Ref Range Status   Campylobacter species NOT DETECTED NOT DETECTED Final   Plesimonas shigelloides NOT DETECTED NOT DETECTED Final   Salmonella species NOT DETECTED NOT DETECTED Final   Yersinia enterocolitica NOT DETECTED NOT DETECTED Final   Vibrio species NOT DETECTED NOT DETECTED Final   Vibrio cholerae NOT DETECTED NOT DETECTED Final   Enteroaggregative E coli (EAEC) NOT DETECTED NOT DETECTED Final   Enteropathogenic E coli (EPEC) NOT DETECTED NOT DETECTED Final   Enterotoxigenic E coli (ETEC) NOT DETECTED NOT DETECTED Final   Shiga like toxin producing E coli (STEC) NOT DETECTED NOT DETECTED Final   Shigella/Enteroinvasive E coli (EIEC) NOT DETECTED NOT DETECTED Final   Cryptosporidium NOT DETECTED NOT DETECTED Final   Cyclospora cayetanensis NOT DETECTED NOT DETECTED Final   Entamoeba histolytica NOT DETECTED NOT DETECTED Final   Giardia lamblia NOT DETECTED NOT DETECTED Final   Adenovirus F40/41 NOT DETECTED NOT DETECTED Final   Astrovirus NOT DETECTED NOT DETECTED Final   Norovirus GI/GII NOT DETECTED NOT DETECTED Final   Rotavirus A NOT DETECTED NOT DETECTED Final    Sapovirus (I, II, IV, and V) NOT DETECTED NOT DETECTED Final    Comment: Performed at [facility], [address] [place], [place] [address]  CSF culture     Status: None (Preliminary result)   Collection Time: 08/07/20  1:28 PM   Specimen: CSF; Cerebrospinal Fluid  Result Value Ref Range Status   Specimen Description CSF  Final   Special Requests Immunocompromised  Final   Gram Stain NO WBC SEEN NO ORGANISMS SEEN   Final   Culture   Final    NO GROWTH < 24 HOURS Performed at [facility], [address] [place], [place] [address]  Report Status PENDING  Incomplete  MRSA PCR Screening     Status: None   Collection Time: 08/08/20  5:34 AM   Specimen: Nasopharyngeal  Result Value Ref Range Status   MRSA by PCR NEGATIVE NEGATIVE Final    Comment:        The GeneXpert MRSA Assay (FDA approved for NASAL specimens only), is one component of a comprehensive MRSA colonization surveillance program. It is not intended to diagnose MRSA infection nor to guide or monitor treatment for MRSA infections. Performed at [facility], [address], [place], [place] [address]     Studies/Results: MR BRAIN W WO CONTRAST  Result Date: 08/08/2020 CLINICAL DATA:  Mental status change EXAM: MRI HEAD WITHOUT AND WITH CONTRAST TECHNIQUE: Multiplanar, multiecho pulse sequences of the brain and surrounding structures were obtained without and with intravenous contrast. CONTRAST:  66mL GADAVIST GADOBUTROL 1 MMOL/ML IV SOLN COMPARISON:  None. FINDINGS: Brain: There is no acute infarction or intracranial hemorrhage. There is no intracranial mass, mass effect, or edema. There is no hydrocephalus or extra-axial fluid collection. Minimal small foci of T2 hyperintensity in the supratentorial white matter likely reflect nonspecific gliosis/demyelination. Ventricles and sulci are normal in size and configuration. No abnormal enhancement. Vascular: Major vessel flow voids at the skull base  are preserved. Skull and upper cervical spine: Normal marrow signal is preserved. Sinuses/Orbits: Paranasal sinuses are aerated. Orbits are unremarkable. Other: Sella is unremarkable.  Mastoid air cells are clear. IMPRESSION: No acute or significant abnormality. Electronically Signed   By: Guadlupe Spanish M.D.   On: 08/08/2020 17:43   MR CERVICAL SPINE W WO CONTRAST  Result Date: 08/08/2020 CLINICAL DATA:  Lower extremity weakness EXAM: MRI CERVICAL, THORACIC AND LUMBAR SPINE WITHOUT AND WITH CONTRAST TECHNIQUE: Multiplanar and multiecho pulse sequences of the cervical spine, to include the craniocervical junction and cervicothoracic junction, and thoracic and lumbar spine, were obtained without and with intravenous contrast. CONTRAST:  22mL GADAVIST GADOBUTROL 1 MMOL/ML IV SOLN COMPARISON:  None. FINDINGS: MRI CERVICAL SPINE Alignment: No significant listhesis. Vertebrae: Vertebral body heights are maintained. No marrow edema. Decreased T1 marrow signal probably reflects hematopoietic marrow. Cord: No abnormal signal or enhancement. Posterior Fossa, vertebral arteries, paraspinal tissues: Unremarkable. Disc levels: Intervertebral disc heights and signal are maintained. There is no disc herniation or stenosis at any level. MRI THORACIC SPINE Alignment:  No significant listhesis. Vertebrae: Vertebral body heights are maintained apart from minimal degenerative endplate irregularity at midthoracic levels. No marrow edema. Decreased T1 marrow signal probably reflects hematopoietic marrow. Cord:  No abnormal signal or enhancement. Paraspinal and other soft tissues: Unremarkable. Disc levels: Minimal right paracentral disc protrusion at T6-T7. No canal or foraminal stenosis at any level. MRI LUMBAR SPINE Segmentation:  Standard. Alignment:  No significant listhesis. Vertebrae: This vertebral body heights are maintained. No marrow edema. Decreased T1 marrow signal probably reflects hematopoietic marrow. Conus medullaris  and cauda equina: Conus extends to the L1 level. Conus and cauda equina appear normal. No abnormal intrathecal enhancement. Paraspinal and other soft tissues: Unremarkable. Disc levels: Intervertebral disc heights and signal are maintained. No disc herniation or stenosis at any level. IMPRESSION: No abnormal cord signal, abnormal enhancement, or stenosis. Electronically Signed   By: Guadlupe Spanish M.D.   On: 08/08/2020 17:56   MR THORACIC SPINE W WO CONTRAST  Result Date: 08/08/2020 CLINICAL DATA:  Lower extremity weakness EXAM: MRI CERVICAL, THORACIC AND LUMBAR SPINE WITHOUT AND WITH CONTRAST TECHNIQUE: Multiplanar and multiecho pulse sequences of the cervical  spine, to include the craniocervical junction and cervicothoracic junction, and thoracic and lumbar spine, were obtained without and with intravenous contrast. CONTRAST:  8mL GADAVIST GADOBUTROL 1 MMOL/ML IV SOLN COMPARISON:  None. FINDINGS: MRI CERVICAL SPINE Alignment: No significant listhesis. Vertebrae: Vertebral body heights are maintained. No marrow edema. Decreased T1 marrow signal probably reflects hematopoietic marrow. Cord: No abnormal signal or enhancement. Posterior Fossa, vertebral arteries, paraspinal tissues: Unremarkable. Disc levels: Intervertebral disc heights and signal are maintained. There is no disc herniation or stenosis at any level. MRI THORACIC SPINE Alignment:  No significant listhesis. Vertebrae: Vertebral body heights are maintained apart from minimal degenerative endplate irregularity at midthoracic levels. No marrow edema. Decreased T1 marrow signal probably reflects hematopoietic marrow. Cord:  No abnormal signal or enhancement. Paraspinal and other soft tissues: Unremarkable. Disc levels: Minimal right paracentral disc protrusion at T6-T7. No canal or foraminal stenosis at any level. MRI LUMBAR SPINE Segmentation:  Standard. Alignment:  No significant listhesis. Vertebrae: This vertebral body heights are maintained. No  marrow edema. Decreased T1 marrow signal probably reflects hematopoietic marrow. Conus medullaris and cauda equina: Conus extends to the L1 level. Conus and cauda equina appear normal. No abnormal intrathecal enhancement. Paraspinal and other soft tissues: Unremarkable. Disc levels: Intervertebral disc heights and signal are maintained. No disc herniation or stenosis at any level. IMPRESSION: No abnormal cord signal, abnormal enhancement, or stenosis. Electronically Signed   By: Guadlupe SpanishPraneil  Patel M.D.   On: 08/08/2020 17:56   MR Lumbar Spine W Wo Contrast  Result Date: 08/08/2020 CLINICAL DATA:  Lower extremity weakness EXAM: MRI CERVICAL, THORACIC AND LUMBAR SPINE WITHOUT AND WITH CONTRAST TECHNIQUE: Multiplanar and multiecho pulse sequences of the cervical spine, to include the craniocervical junction and cervicothoracic junction, and thoracic and lumbar spine, were obtained without and with intravenous contrast. CONTRAST:  8mL GADAVIST GADOBUTROL 1 MMOL/ML IV SOLN COMPARISON:  None. FINDINGS: MRI CERVICAL SPINE Alignment: No significant listhesis. Vertebrae: Vertebral body heights are maintained. No marrow edema. Decreased T1 marrow signal probably reflects hematopoietic marrow. Cord: No abnormal signal or enhancement. Posterior Fossa, vertebral arteries, paraspinal tissues: Unremarkable. Disc levels: Intervertebral disc heights and signal are maintained. There is no disc herniation or stenosis at any level. MRI THORACIC SPINE Alignment:  No significant listhesis. Vertebrae: Vertebral body heights are maintained apart from minimal degenerative endplate irregularity at midthoracic levels. No marrow edema. Decreased T1 marrow signal probably reflects hematopoietic marrow. Cord:  No abnormal signal or enhancement. Paraspinal and other soft tissues: Unremarkable. Disc levels: Minimal right paracentral disc protrusion at T6-T7. No canal or foraminal stenosis at any level. MRI LUMBAR SPINE Segmentation:  Standard.  Alignment:  No significant listhesis. Vertebrae: This vertebral body heights are maintained. No marrow edema. Decreased T1 marrow signal probably reflects hematopoietic marrow. Conus medullaris and cauda equina: Conus extends to the L1 level. Conus and cauda equina appear normal. No abnormal intrathecal enhancement. Paraspinal and other soft tissues: Unremarkable. Disc levels: Intervertebral disc heights and signal are maintained. No disc herniation or stenosis at any level. IMPRESSION: No abnormal cord signal, abnormal enhancement, or stenosis. Electronically Signed   By: Guadlupe SpanishPraneil  Patel M.D.   On: 08/08/2020 17:56   EEG adult  Result Date: 08/08/2020 Charlsie QuestYadav, Priyanka O, MD     08/08/2020 12:15 PM Patient Name: Harold Flores MRN: [id number] Epilepsy Attending: Charlsie QuestPriyanka O Yadav Referring Physician/Provider: Jimmye NormanKaren Kirby-Graham, NP Date: 08/08/2020 Duration: 25.19 minutes Patient history: 30 year old male with altered mental status.  EEG to evaluate for seizures. Level of alertness: Awake AEDs  during EEG study: Technical aspects: This EEG study was done with scalp electrodes positioned according to the 10-20 International system of electrode placement. Electrical activity was acquired at a sampling rate of 500Hz  and reviewed with a high frequency filter of 70Hz  and a low frequency filter of 1Hz . EEG data were recorded continuously and digitally stored. Description: The posterior dominant rhythm consists of 9-10 Hz activity of moderate voltage (25-35 uV) seen predominantly in posterior head regions, symmetric and reactive to eye opening and eye closing. EEG showed intermittent generalized 5 to 6 Hz theta slowing. Physiologic photic driving was seen during photic stimulation.  Hyperventilation was not performed.   ABNORMALITY -Intermittent slow, generalized IMPRESSION: This study is suggestive of mild diffuse encephalopathy, nonspecific etiology. No seizures or epileptiform discharges were seen throughout the  recording. Priyanka      Assessment/Plan:  INTERVAL HISTORY:   MRIS and LP complete  Principal Problem:   Encephalopathy Active Problems:   GAD (generalized anxiety disorder)   Essential hypertension, benign   Gastroesophageal reflux disease without esophagitis   AIDS (acquired immune deficiency syndrome) (HCC)   Hypokalemia   Lower extremity weakness    Harold Flores is a 30 y.o. male with  HIV, AIDS with loss of strength in his lower extremities gait instability and now numbness in his hands as well as feet encephalopathy waxing and waning neurological status.  Encephalopathy and neuro deficits:  His MRIS of brain and entire spine are completely wihtout pathology  Opening pressure normal  CSF normal  CSF crypto ag negative,  Multiple studies pending  I suspect that IF his Neuro deficits are related to infection then most likely related to his HIV itself  I agree consider other non ID issues confounding his exam including etoh withdrawal that is being treated  We will start Biktarvy, followup pending labs from CSF  HIV starting Biktarvy and  and Bactrim for PCP prevention Will need to enroll into [facility]  Get BIktarvy 30 day through [facility]  Etoh withdrawal: being treated     LOS: 6 days   Harold [place] 08/09/2020, 10:10 AM

## 2020-08-09 NOTE — Assessment & Plan Note (Signed)
-   repleting as needed 

## 2020-08-09 NOTE — Assessment & Plan Note (Addendum)
-   patient symptoms include delirium, hallucinations - etiology considered due to metabolic possibly from infectious source however still may be due to etoh use as well - follow up B1 level, 141 nmol/L (range 66 - 200 nmol/L) - MRIs are negative as well as prelim results from LP; still other tests pending - treated with 5 days IVIG ( 3/2 - 3/6) for empiric GBS tx - also on ganciclovir (started 3/2), and high-dose thiamine/folic acid and monitor mentation response (seems to have been slowly improving the past couple days) - CSF CMV fluid is negative; ID discontinuing ganciclovir on 3/7

## 2020-08-09 NOTE — Assessment & Plan Note (Addendum)
-   appears to be slowly improving past few days but still notable - PT/OT recommending CIR - etiology still u/k; possibly severe deconditioning from his excessive alcohol use versus infectious etiology from underlying uncontrolled HIV (followed by neurology and ID) - patient started on IVIG (cannot rule out GBS at this time) per neurology; 5 day course (3/2 - 3/6) -Continue with PT while hospitalized as patient is able - MRI C/TL spine unremarkable to explain weakness; MRI brain also unremarkable  - continue folic acid

## 2020-08-09 NOTE — Assessment & Plan Note (Signed)
-  Likely from GI losses with diarrhea -Replete and recheck as needed

## 2020-08-09 NOTE — Assessment & Plan Note (Signed)
-  Consider due to diuresis prior to admission -Has been improving

## 2020-08-09 NOTE — Assessment & Plan Note (Signed)
Continue Zoloft 

## 2020-08-09 NOTE — Progress Notes (Signed)
PROGRESS NOTE    Harold Flores   ZOX:[id number]  DOB: 12/10/1990  DOA: 08/03/2020     6  PCP: Etta GrandchildJones, Harold L, MD  CC: weakness, AMS  Hospital Course: [name] is a 30 year old male with PMH HIV, noncompliant to medications, essential hypertension, alcohol abuse, generalized anxiety disorder, GERD recently had lower extremity edema of unclear etiology and was started on Lasix following which patient reports not feeling well with generalized weakness.   The swelling improved after Lasix but on arrival to ED he was found to have significantly low potassium.  [facility] for further evaluation and management of severe hypokalemia and lower extremity edema.     On further discussions with the patient's husband, his symptoms have been ongoing for approximately 1 month. He has been non compliant to his HIV meds.  He had then developed multiple loose stools with intermittent confusion and hallucinations. Further collateral information was provided from his mother who also reported that he under reports his amount of drinking.  She states that he drinks an excessive amount of alcohol daily at home.    ID was also consulted given his confusion and concern for possible underlying infectious etiology.  He was recommended for undergoing full back MRI as well as brain MRI and LP.  Imaging studies were negative for any pathology including brain MRI.  LP was also obtained on 08/08/2020 which had normal opening pressure and negative for signs of infection.  Further studies still in process.  He continued to exhibit intermittent episodes of confusion during hospitalization.  Interval History:  Became agitated overnight and was calling 911 and going up to the nurse station several times.  He was confused and not making sense.  He was given a dose of Ativan due to elevated CIWA score. When seen this morning, he was more lethargic compared to yesterday and having difficulty staying awake  for conversations or answering questions appropriately.  ROS: Constitutional: negative for chills and fevers, Respiratory: negative for cough, Cardiovascular: negative for chest pain and Gastrointestinal: negative for abdominal pain  Assessment & Plan: * Acute metabolic encephalopathy - patient symptoms include delirium, hallucinations - etiology considered due to metabolic possibly from infectious source however still may be due to etoh use as well - follow up B1 level, still in process - MRIs are negative as well as prelim results from LP; still other tests pending - continue treating with CIWA for now and monitoring   Alcohol abuse - evening of 3/1 appeared to have further withdrawal symptoms and was given Ativan under CIWA protocol. -Continue CIWA -Continue folate, thiamine, multivitamin  Lower extremity weakness - PT/OT recommending CIR - etiology still u/k; possibly severe deconditioning from his excessive alcohol use versus infectious etiology from underlying uncontrolled HIV (discussed with neurology, possible treatment for CMV and GBS being initiated) -Mother concerned that patient may not go to rehab when he becomes more lucid -Continue with PT while hospitalized as patient is able - MRI C/TL spine unremarkable to explain weakness; MRI brain also unremarkable   Folic acid deficiency - level 3.4 - has been on oral folate but also having diarrhea on admission - given some concern for possible malabsorption as well as concern for myelopathy/persistent LE weakness will initiate on high dose course of IV folic acid followed by oral again (discussed with neurology as well)  AIDS (acquired immune deficiency syndrome) (HCC) - Non compliant to meds. CD4 absolute count is 40, HIV RNA 104,000  - ID  following -Biktarvy being started per ID -Bactrim also started for PJP ppx  Diarrhea -Negative GI pathogen panel -As he has continued to have withdrawal symptoms, diarrhea may be  related to ongoing alcohol withdrawal (could also be seen in CMV)  Normocytic anemia - downtrend from 2021 (prior 13-15 g/dL), now downtrending further -Iron stores adequate. -Folate low, 3.4 -Vitamin B12 726  Hyponatremia -Consider due to diuresis prior to admission -Has been improving  Hypomagnesemia -Likely from GI losses with diarrhea -Replete and recheck as needed  Hypokalemia - repleting as needed  Essential hypertension, benign -Continue amlodipine and Coreg  GAD (generalized anxiety disorder) -Continue Zoloft  Elevated troponin-resolved as of 08/09/2020 Pt denies any chest pain or sob.  ? From severe hypokalemia , anemia and demand ischemia.  ekg shows sinus tachycardia with t wave inversions in the lateral leads. Echocardiogram no significant abnormalities.  CT angio of the chest negative for PE.  Cardiology consulted for elevated troponins, recommended no further work-up.   Old records reviewed in assessment of this patient  Antimicrobials: N/a  DVT prophylaxis: SCDs Start: 08/03/20 2229   Code Status:   Code Status: Full Code Family Communication: none present  Disposition Plan: Status is: Inpatient  Remains inpatient appropriate because:Ongoing diagnostic testing needed not appropriate for outpatient work up, IV treatments appropriate due to intensity of illness or inability to take PO and Inpatient level of care appropriate due to severity of illness   Dispo: The patient is from: Home              Anticipated d/c is to: possibly CIR              Patient currently is not medically stable to d/c.   Difficult to place patient No Risk of unplanned readmission score: Unplanned Admission- Pilot do not use: 9.9   Objective: Blood pressure (!) 146/91, pulse (!) 106, temperature 98.6 F (37 C), temperature source Oral, resp. rate 16, height 5\' 10"  (1.778 m), weight 82.8 kg, SpO2 97 %.  Examination: General appearance: Much more drowsy/lethargic today.   Also confused and could not carry on conversation Head: Normocephalic, without obvious abnormality, atraumatic Eyes: EOMI Lungs: clear to auscultation bilaterally Heart: regular rate and rhythm and S1, S2 normal Abdomen: normal findings: bowel sounds normal and soft, non-tender Extremities: no edema Skin: mobility and turgor normal Neurologic: slowed mentation; 4/5 RUE strength. Today LE strength worse (2/5, but might be poor effort too)  Consultants:   ID  IR  Neurology   Procedures:   LP, 3/1  Data Reviewed: I have personally reviewed following labs and imaging studies Results for orders placed or performed during the hospital encounter of 08/03/20 (from the past 24 hour(s))  Cryptococcal antigen, CSF     Status: None   Collection Time: 08/08/20  5:00 PM  Result Value Ref Range   Crypto Ag NEGATIVE NEGATIVE   Cryptococcal Ag Titer NOT INDICATED NOT INDICATED  VDRL, CSF     Status: None   Collection Time: 08/08/20  5:00 PM  Result Value Ref Range   VDRL Quant, CSF Non Reactive Non Rea:<1:1  Protein and glucose, CSF     Status: None   Collection Time: 08/08/20  5:00 PM  Result Value Ref Range   Glucose, CSF 50 40 - 70 mg/dL   Total  Protein, CSF 22 15 - 45 mg/dL  CSF cell count with differential collection tube #: 1     Status: Abnormal   Collection Time: 08/08/20  5:00  PM  Result Value Ref Range   Tube # 1    Color, CSF COLORLESS COLORLESS   Appearance, CSF CLEAR CLEAR   Supernatant NOT INDICATED    RBC Count, CSF 1 (H) 0 /cu mm   WBC, CSF 1 0 - 5 /cu mm   Other Cells, CSF TOO FEW TO COUNT, SMEAR AVAILABLE FOR REVIEW   Miscellaneous [facility] test (send-out)     Status: None   Collection Time: 08/08/20  6:32 PM  Result Value Ref Range   [facility] test code [id number]    [facility] test name VARICELLA ZOSTER VIRUS ANTIBODY IGG CSF    Source ([facility]) CSF    Misc [facility] result COMMENT   CSF cell count with differential collection tube #: 4     Status: Abnormal    Collection Time: 08/08/20  6:32 PM  Result Value Ref Range   Tube # 4    Color, CSF COLORLESS COLORLESS   Appearance, CSF CLEAR CLEAR   Supernatant NOT INDICATED    RBC Count, CSF 1 (H) 0 /cu mm   WBC, CSF 0 0 - 5 /cu mm   Other Cells, CSF TOO FEW TO COUNT, SMEAR AVAILABLE FOR REVIEW   Urinalysis, Routine w reflex microscopic Urine, Clean Catch     Status: Abnormal   Collection Time: 08/09/20  3:33 AM  Result Value Ref Range   Color, Urine YELLOW YELLOW   APPearance CLEAR CLEAR   Specific Gravity, Urine 1.020 1.005 - 1.030   pH 8.0 5.0 - 8.0   Glucose, UA NEGATIVE NEGATIVE mg/dL   Hgb urine dipstick NEGATIVE NEGATIVE   Bilirubin Urine NEGATIVE NEGATIVE   Ketones, ur 20 (A) NEGATIVE mg/dL   Protein, ur 30 (A) NEGATIVE mg/dL   Nitrite NEGATIVE NEGATIVE   Leukocytes,Ua NEGATIVE NEGATIVE   RBC / HPF 0-5 0 - 5 RBC/hpf   WBC, UA 0-5 0 - 5 WBC/hpf   Bacteria, UA NONE SEEN NONE SEEN   Mucus PRESENT   CBC     Status: Abnormal   Collection Time: 08/09/20  4:13 AM  Result Value Ref Range   WBC 9.9 4.0 - 10.5 K/uL   RBC 2.74 (L) 4.22 - 5.81 MIL/uL   Hemoglobin 9.1 (L) 13.0 - 17.0 g/dL   HCT 45.4 (L) 09.8 - 11.9 %   MCV 92.3 80.0 - 100.0 fL   MCH 33.2 26.0 - 34.0 pg   MCHC 36.0 30.0 - 36.0 g/dL   RDW 14.7 (H) 82.9 - 56.2 %   Platelets 296 150 - 400 K/uL   nRBC 0.0 0.0 - 0.2 %  Basic metabolic panel     Status: Abnormal   Collection Time: 08/09/20  4:13 AM  Result Value Ref Range   Sodium 133 (L) 135 - 145 mmol/L   Potassium 4.5 3.5 - 5.1 mmol/L   Chloride 92 (L) 98 - 111 mmol/L   CO2 25 22 - 32 mmol/L   Glucose, Bld 70 70 - 99 mg/dL   BUN 11 6 - 20 mg/dL   Creatinine, Ser 1.30 0.61 - 1.24 mg/dL   Calcium 8.9 8.9 - 86.5 mg/dL   GFR, Estimated >78 >46 mL/min   Anion gap 16 (H) 5 - 15  Magnesium     Status: Abnormal   Collection Time: 08/09/20  4:13 AM  Result Value Ref Range   Magnesium 1.5 (L) 1.7 - 2.4 mg/dL    Recent Results (from the past 240 hour(s))  SARS CORONAVIRUS  2 (TAT 6-24  HRS) Nasopharyngeal Nasopharyngeal Swab     Status: None   Collection Time: 08/03/20 10:00 PM   Specimen: Nasopharyngeal Swab  Result Value Ref Range Status   SARS Coronavirus 2 NEGATIVE NEGATIVE Final    Comment: (NOTE) SARS-CoV-2 target nucleic acids are NOT DETECTED.  The SARS-CoV-2 RNA is generally detectable in upper and lower respiratory specimens during the acute phase of infection. Negative results do not preclude SARS-CoV-2 infection, do not rule out co-infections with other pathogens, and should not be used as the sole basis for treatment or other patient management decisions. Negative results must be combined with clinical observations, patient history, and epidemiological information. The expected result is Negative.  Fact Sheet for Patients: HairSlick.no  Fact Sheet for Healthcare Providers: quierodirigir.com  This test is not yet approved or cleared by the [place] FDA and  has been authorized for detection and/or diagnosis of SARS-CoV-2 by FDA under an Emergency Use Authorization (EUA). This EUA will remain  in effect (meaning this test can be used) for the duration of the COVID-19 declaration under Se ction 564(b)(1) of the Act, 21 U.S.C. section 360bbb-3(b)(1), unless the authorization is terminated or revoked sooner.  Performed at [facility], [address], [place], [place] [address]   Gastrointestinal Panel by PCR , Stool     Status: None   Collection Time: 08/06/20  4:59 PM   Specimen: Stool  Result Value Ref Range Status   Campylobacter species NOT DETECTED NOT DETECTED Final   Plesimonas shigelloides NOT DETECTED NOT DETECTED Final   Salmonella species NOT DETECTED NOT DETECTED Final   Yersinia enterocolitica NOT DETECTED NOT DETECTED Final   Vibrio species NOT DETECTED NOT DETECTED Final   Vibrio cholerae NOT DETECTED NOT DETECTED Final   Enteroaggregative E coli (EAEC)  NOT DETECTED NOT DETECTED Final   Enteropathogenic E coli (EPEC) NOT DETECTED NOT DETECTED Final   Enterotoxigenic E coli (ETEC) NOT DETECTED NOT DETECTED Final   Shiga like toxin producing E coli (STEC) NOT DETECTED NOT DETECTED Final   Shigella/Enteroinvasive E coli (EIEC) NOT DETECTED NOT DETECTED Final   Cryptosporidium NOT DETECTED NOT DETECTED Final   Cyclospora cayetanensis NOT DETECTED NOT DETECTED Final   Entamoeba histolytica NOT DETECTED NOT DETECTED Final   Giardia lamblia NOT DETECTED NOT DETECTED Final   Adenovirus F40/41 NOT DETECTED NOT DETECTED Final   Astrovirus NOT DETECTED NOT DETECTED Final   Norovirus GI/GII NOT DETECTED NOT DETECTED Final   Rotavirus A NOT DETECTED NOT DETECTED Final   Sapovirus (I, II, IV, and V) NOT DETECTED NOT DETECTED Final    Comment: Performed at [facility], [address] [place], [place] [address]  Culture, fungus without smear     Status: None (Preliminary result)   Collection Time: 08/07/20  1:28 PM   Specimen: CSF; Cerebrospinal Fluid  Result Value Ref Range Status   Specimen Description CSF  Final   Special Requests Immunocompromised  Final   Culture   Final    NO FUNGUS ISOLATED AFTER 1 DAY Performed at [facility], [address], [place], [place] [address]    Report Status PENDING  Incomplete  CSF culture     Status: None (Preliminary result)   Collection Time: 08/07/20  1:28 PM   Specimen: CSF; Cerebrospinal Fluid  Result Value Ref Range Status   Specimen Description CSF  Final   Special Requests Immunocompromised  Final   Gram Stain NO WBC SEEN NO ORGANISMS SEEN   Final  Culture   Final    NO GROWTH < 24 HOURS Performed at [facility], [address] [place], [place] [address]    Report Status PENDING  Incomplete  MRSA PCR Screening     Status: None   Collection Time: 08/08/20  5:34 AM   Specimen: Nasopharyngeal  Result Value Ref Range Status   MRSA by PCR NEGATIVE NEGATIVE Final     Comment:        The GeneXpert MRSA Assay (FDA approved for NASAL specimens only), is one component of a comprehensive MRSA colonization surveillance program. It is not intended to diagnose MRSA infection nor to guide or monitor treatment for MRSA infections. Performed at [facility], [address], [place], [place] [address]      Radiology Studies: MR BRAIN W WO CONTRAST  Result Date: 08/08/2020 CLINICAL DATA:  Mental status change EXAM: MRI HEAD WITHOUT AND WITH CONTRAST TECHNIQUE: Multiplanar, multiecho pulse sequences of the brain and surrounding structures were obtained without and with intravenous contrast. CONTRAST:  8mL GADAVIST GADOBUTROL 1 MMOL/ML IV SOLN COMPARISON:  None. FINDINGS: Brain: There is no acute infarction or intracranial hemorrhage. There is no intracranial mass, mass effect, or edema. There is no hydrocephalus or extra-axial fluid collection. Minimal small foci of T2 hyperintensity in the supratentorial white matter likely reflect nonspecific gliosis/demyelination. Ventricles and sulci are normal in size and configuration. No abnormal enhancement. Vascular: Major vessel flow voids at the skull base are preserved. Skull and upper cervical spine: Normal marrow signal is preserved. Sinuses/Orbits: Paranasal sinuses are aerated. Orbits are unremarkable. Other: Sella is unremarkable.  Mastoid air cells are clear. IMPRESSION: No acute or significant abnormality. Electronically Signed   By: Guadlupe Spanish M.D.   On: 08/08/2020 17:43   MR CERVICAL SPINE W WO CONTRAST  Result Date: 08/08/2020 CLINICAL DATA:  Lower extremity weakness EXAM: MRI CERVICAL, THORACIC AND LUMBAR SPINE WITHOUT AND WITH CONTRAST TECHNIQUE: Multiplanar and multiecho pulse sequences of the cervical spine, to include the craniocervical junction and cervicothoracic junction, and thoracic and lumbar spine, were obtained without and with intravenous contrast. CONTRAST:  8mL GADAVIST GADOBUTROL 1 MMOL/ML IV  SOLN COMPARISON:  None. FINDINGS: MRI CERVICAL SPINE Alignment: No significant listhesis. Vertebrae: Vertebral body heights are maintained. No marrow edema. Decreased T1 marrow signal probably reflects hematopoietic marrow. Cord: No abnormal signal or enhancement. Posterior Fossa, vertebral arteries, paraspinal tissues: Unremarkable. Disc levels: Intervertebral disc heights and signal are maintained. There is no disc herniation or stenosis at any level. MRI THORACIC SPINE Alignment:  No significant listhesis. Vertebrae: Vertebral body heights are maintained apart from minimal degenerative endplate irregularity at midthoracic levels. No marrow edema. Decreased T1 marrow signal probably reflects hematopoietic marrow. Cord:  No abnormal signal or enhancement. Paraspinal and other soft tissues: Unremarkable. Disc levels: Minimal right paracentral disc protrusion at T6-T7. No canal or foraminal stenosis at any level. MRI LUMBAR SPINE Segmentation:  Standard. Alignment:  No significant listhesis. Vertebrae: This vertebral body heights are maintained. No marrow edema. Decreased T1 marrow signal probably reflects hematopoietic marrow. Conus medullaris and cauda equina: Conus extends to the L1 level. Conus and cauda equina appear normal. No abnormal intrathecal enhancement. Paraspinal and other soft tissues: Unremarkable. Disc levels: Intervertebral disc heights and signal are maintained. No disc herniation or stenosis at any level. IMPRESSION: No abnormal cord signal, abnormal enhancement, or stenosis. Electronically Signed   By: Guadlupe Spanish M.D.   On: 08/08/2020 17:56   MR THORACIC SPINE W WO CONTRAST  Result Date: 08/08/2020 CLINICAL DATA:  Lower extremity weakness EXAM: MRI CERVICAL, THORACIC AND LUMBAR SPINE WITHOUT AND WITH CONTRAST TECHNIQUE: Multiplanar and multiecho pulse sequences of the cervical spine, to include the craniocervical junction and cervicothoracic junction, and thoracic and lumbar spine, were  obtained without and with intravenous contrast. CONTRAST:  15mL GADAVIST GADOBUTROL 1 MMOL/ML IV SOLN COMPARISON:  None. FINDINGS: MRI CERVICAL SPINE Alignment: No significant listhesis. Vertebrae: Vertebral body heights are maintained. No marrow edema. Decreased T1 marrow signal probably reflects hematopoietic marrow. Cord: No abnormal signal or enhancement. Posterior Fossa, vertebral arteries, paraspinal tissues: Unremarkable. Disc levels: Intervertebral disc heights and signal are maintained. There is no disc herniation or stenosis at any level. MRI THORACIC SPINE Alignment:  No significant listhesis. Vertebrae: Vertebral body heights are maintained apart from minimal degenerative endplate irregularity at midthoracic levels. No marrow edema. Decreased T1 marrow signal probably reflects hematopoietic marrow. Cord:  No abnormal signal or enhancement. Paraspinal and other soft tissues: Unremarkable. Disc levels: Minimal right paracentral disc protrusion at T6-T7. No canal or foraminal stenosis at any level. MRI LUMBAR SPINE Segmentation:  Standard. Alignment:  No significant listhesis. Vertebrae: This vertebral body heights are maintained. No marrow edema. Decreased T1 marrow signal probably reflects hematopoietic marrow. Conus medullaris and cauda equina: Conus extends to the L1 level. Conus and cauda equina appear normal. No abnormal intrathecal enhancement. Paraspinal and other soft tissues: Unremarkable. Disc levels: Intervertebral disc heights and signal are maintained. No disc herniation or stenosis at any level. IMPRESSION: No abnormal cord signal, abnormal enhancement, or stenosis. Electronically Signed   By: Guadlupe Spanish M.D.   On: 08/08/2020 17:56   MR Lumbar Spine W Wo Contrast  Result Date: 08/08/2020 CLINICAL DATA:  Lower extremity weakness EXAM: MRI CERVICAL, THORACIC AND LUMBAR SPINE WITHOUT AND WITH CONTRAST TECHNIQUE: Multiplanar and multiecho pulse sequences of the cervical spine, to include  the craniocervical junction and cervicothoracic junction, and thoracic and lumbar spine, were obtained without and with intravenous contrast. CONTRAST:  30mL GADAVIST GADOBUTROL 1 MMOL/ML IV SOLN COMPARISON:  None. FINDINGS: MRI CERVICAL SPINE Alignment: No significant listhesis. Vertebrae: Vertebral body heights are maintained. No marrow edema. Decreased T1 marrow signal probably reflects hematopoietic marrow. Cord: No abnormal signal or enhancement. Posterior Fossa, vertebral arteries, paraspinal tissues: Unremarkable. Disc levels: Intervertebral disc heights and signal are maintained. There is no disc herniation or stenosis at any level. MRI THORACIC SPINE Alignment:  No significant listhesis. Vertebrae: Vertebral body heights are maintained apart from minimal degenerative endplate irregularity at midthoracic levels. No marrow edema. Decreased T1 marrow signal probably reflects hematopoietic marrow. Cord:  No abnormal signal or enhancement. Paraspinal and other soft tissues: Unremarkable. Disc levels: Minimal right paracentral disc protrusion at T6-T7. No canal or foraminal stenosis at any level. MRI LUMBAR SPINE Segmentation:  Standard. Alignment:  No significant listhesis. Vertebrae: This vertebral body heights are maintained. No marrow edema. Decreased T1 marrow signal probably reflects hematopoietic marrow. Conus medullaris and cauda equina: Conus extends to the L1 level. Conus and cauda equina appear normal. No abnormal intrathecal enhancement. Paraspinal and other soft tissues: Unremarkable. Disc levels: Intervertebral disc heights and signal are maintained. No disc herniation or stenosis at any level. IMPRESSION: No abnormal cord signal, abnormal enhancement, or stenosis. Electronically Signed   By: Guadlupe Spanish M.D.   On: 08/08/2020 17:56   EEG adult  Result Date: 08/08/2020 Charlsie Quest, MD     08/08/2020 12:15 PM Patient Name: SOUL HACKMAN MRN: [id number] Epilepsy Attending: Charlsie Quest  Referring Physician/Provider: Jimmye Norman, NP Date: 08/08/2020 Duration: 25.19 minutes Patient history: 30 year old male with altered mental status.  EEG to evaluate for seizures. Level of alertness: Awake AEDs during EEG study: Technical aspects: This EEG study was done with scalp electrodes positioned according to the 10-20 International system of electrode placement. Electrical activity was acquired at a sampling rate of 500Hz  and reviewed with a high frequency filter of 70Hz  and a low frequency filter of 1Hz . EEG data were recorded continuously and digitally stored. Description: The posterior dominant rhythm consists of 9-10 Hz activity of moderate voltage (25-35 uV) seen predominantly in posterior head regions, symmetric and reactive to eye opening and eye closing. EEG showed intermittent generalized 5 to 6 Hz theta slowing. Physiologic photic driving was seen during photic stimulation.  Hyperventilation was not performed.   ABNORMALITY -Intermittent slow, generalized IMPRESSION: This study is suggestive of mild diffuse encephalopathy, nonspecific etiology. No seizures or epileptiform discharges were seen throughout the recording. Priyanka   MR BRAIN W WO CONTRAST  Final Result    MR CERVICAL SPINE W WO CONTRAST  Final Result    MR Lumbar Spine W Wo Contrast  Final Result    MR THORACIC SPINE W WO CONTRAST  Final Result    DG Abd 1 View  Final Result    CT ANGIO CHEST PE W OR WO CONTRAST  Final Result    DG Chest Port 1 View  Final Result      Scheduled Meds: . amLODipine  10 mg Oral Daily  . bictegravir-emtricitabine-tenofovir AF  1 tablet Oral Daily  . carvedilol  6.25 mg Oral BID WC  . [START ON 08/14/2020] folic acid  1 mg Oral Daily  . Gerhardt's butt cream   Topical TID  . multivitamin with minerals  1 tablet Oral Daily  . sertraline  100 mg Oral Daily  . sulfamethoxazole-trimethoprim  1 tablet Oral Daily  . thiamine  100 mg Oral Daily   Or  . thiamine  100  mg Intravenous Daily   PRN Meds: lip balm, loperamide, LORazepam **OR** LORazepam, ondansetron **OR** ondansetron (ZOFRAN) IV Continuous Infusions: . folic acid (FOLVITE) IVPB    . Immune Globulin 10%    . magnesium sulfate bolus IVPB 4 g (08/09/20 1546)     LOS: 6 days  Time spent: Greater than 50% of the 35 minute visit was spent in counseling/coordination of care for the patient as laid out in the A&P.   05-25-1992, MD [facility] 08/09/2020, 4:11 PM

## 2020-08-09 NOTE — Assessment & Plan Note (Signed)
Pt denies any chest pain or sob.  ? From severe hypokalemia , anemia and demand ischemia.  ekg shows sinus tachycardia with t wave inversions in the lateral leads. Echocardiogram no significant abnormalities.  CT angio of the chest negative for PE.  Cardiology consulted for elevated troponins, recommended no further work-up.

## 2020-08-09 NOTE — Assessment & Plan Note (Signed)
-   Non compliant to meds. CD4 absolute count is 40, HIV RNA 104,000  - ID following -Biktarvy being started per ID -Bactrim also started for PJP ppx

## 2020-08-09 NOTE — Assessment & Plan Note (Addendum)
-  Negative GI pathogen panel -As he has continued to have withdrawal symptoms, diarrhea may be related to ongoing alcohol withdrawal vs ART vs (could also be seen in CMV, but testing finally back and negative)

## 2020-08-09 NOTE — Plan of Care (Signed)
  Problem: Nutrition: Goal: Adequate nutrition will be maintained Outcome: Progressing   Problem: Safety: Goal: Ability to remain free from injury will improve Outcome: Progressing   

## 2020-08-09 NOTE — Hospital Course (Addendum)
Harold Flores is a 30 year old male with PMH HIV, noncompliant to medications, essential hypertension, alcohol abuse, generalized anxiety disorder, GERD recently had lower extremity edema of unclear etiology and was started on Lasix following which patient reports not feeling well with generalized weakness.   The swelling improved after Lasix but on arrival to ED he was found to have significantly low potassium.  He was admitted to [facility] for further evaluation and management of severe hypokalemia and lower extremity edema.     On further discussions with the patient's husband, his symptoms have been ongoing for approximately 1 month. He has been non compliant to his HIV meds.  He had then developed multiple loose stools with intermittent confusion and hallucinations. Further collateral information was provided from his mother who also reported that he under reports his amount of drinking.  She states that he drinks an excessive amount of alcohol daily at home.    ID was also consulted given his confusion and concern for possible underlying infectious etiology.  He was recommended for undergoing full back MRI as well as brain MRI and LP.  Imaging studies were negative for any pathology including brain MRI.  LP was also obtained on 08/08/2020 which had normal opening pressure and negative for signs of infection.  Further studies still in process.  He continued to exhibit intermittent episodes of confusion during hospitalization. Studies were reviewed by neurology on 08/09/2020.  Decision was made to start patient on IVIG as well as ganciclovir in conjunction with discussion with ID.  He was also placed on high-dose folic acid and thiamine supplementations.

## 2020-08-09 NOTE — Assessment & Plan Note (Addendum)
-   level 3.4 - has been on oral folate but also having diarrhea on admission - given some concern for possible malabsorption as well as concern for myelopathy/persistent LE weakness will initiate on high dose course of IV folic acid followed by oral again (discussed with neurology as well)

## 2020-08-09 NOTE — Progress Notes (Signed)
Subjective: No significant changes  Exam: Vitals:   08/09/20 0631 08/09/20 0804  BP: 118/80 (!) 146/91  Pulse: (!) 105 (!) 106  Resp: 18 16  Temp: 98 F (36.7 C) 98.6 F (37 C)  SpO2: 98% 97%   Gen: In bed, NAD Resp: non-labored breathing, no acute distress Abd: soft, nt  Neuro: MS: awake, alert, interactiv eand appropriate JY:NWGNF, EOMI, no nystagmus Motor: 5/5 in UE, he has 4/5 hip flexion 3/5 knee extension/flexion, 1/5 on th elef tan d0/5 on the right dorsiflexion Sensory:decreased to LT at the ankles AOZ:HYQMVH in BLE, preserved at biceps He is ataxic in his upper extremities   Pertinent Labs: Folate 3.4 Thiamine pending CSF WBC 1 RBC 1 Protein 22 Glucose 50 CSF vdrl - negative  HIV RNA quant 104,000, CD4 40 Peripheral CMV +  B12 726  Imaging: MRI imaging of the complete neuraxis is negative with and without contrast, no evidence of nerve root enhancement   Impression: 30 year old male with acute neuropathic process by exam as well as some delirium in the setting of AIDS.  Possible etiologies include Guillain-Barr syndrome (though I would expect elevated protein at this point), CMV polyradiculopathy (though I would expect pleocytosis on LP), folate deficiency neuropathy (though this is typically more sensory), thiamine deficiency (which could explain both confusion and neuropathy).  Folate neuropathy has the unusual distinction of preserving biceps reflexes, but as stated above it is usually more of a sensory neuropathy.  At this point, I do not think I can completely exclude Guillain-Barr syndrome and given that he is still having progression, I do think I would favor treating with IVIG.    CMV polyradiculopathy is less likely given no pleocytosis, but with concern for the severity that the symptoms presents, I would favor starting treatment as we await his CSF CMV.  I discussed this with infectious disease and they are starting ganciclovir.  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  Recommendations: 1) agree with high-dose folate 2) I increased thiamine to 500 mg 3 times daily for 2 days 3) appreciate ID assistance, ganciclovir has been started 4) IVIG total 2 g/kg dose, divided over 5 days 5) neurology will follow  Ritta Slot, MD [facility] [phone]  If 7pm- 7am, please page neurology on call as listed in AMION.

## 2020-08-09 NOTE — Progress Notes (Addendum)
03/01 2122 Pt agitated and irritable. Pt is confused and hallucinating. Pt called 911 three times. Security came up to talk to him and asked RN to take the room phone from Pt room. Pt kept calling the nurses station that he wanted to leave. B. Chotiner,MD notified. RN will continue to monitor pt. Awaiting orders.  03/01 2127 Orders given.  03/01 2344 Pt BP 149/108, 110, RR 20. Pt asymptomatic. Pt is observed to be calm.No BP PRN. B. Chotiner,MD notified.RN will continue to monitor pt. Awaiting orders.  03/01 2349 Orders given.   03/03 0346 Pt BP 153/100,P105. Pt asymptomatic. Pt observed to be calm. B. Chotiner,MD notified. Awaiting orders.  03/02 0350 Orders given.  03/02 0631 Pt BP 118/80,P 105. B. Chotiner has been notified of Pt's current status. Pt is calm and observed to be comfortably sleeping.  03/02 0638 B. Chotiner has communicated with RN.

## 2020-08-09 NOTE — Assessment & Plan Note (Signed)
-   downtrend from 2021 (prior 13-15 g/dL), now downtrending further -Iron stores adequate. -Folate low, 3.4 -Vitamin B12 726

## 2020-08-09 NOTE — TOC Benefit Eligibility Note (Signed)
Patient Advocate Encounter  Completed and sent [employer] Advancing Access application for Biktarvy for this patient who is uninsured.    Patient is approved.  BIN      [id number] PCN    [id number] [id number]    [id number] ID        [id number]   Roland Earl, CPhT Pharmacy Patient Advocate Specialist [place] [facility] Stewardship Team Direct Number: [phone]  Fax: [phone]

## 2020-08-09 NOTE — Progress Notes (Signed)
Occupational Therapy Treatment Patient Details Name: Harold Flores MRN: [id number] DOB: 06/16/1990 Today's Date: 08/09/2020    History of present illness 30 year old gentleman prior history of HIV, noncompliant to medications, essential hypertension, alcohol abuse, generalized anxiety disorder, GERD recently had lower extremity edema of unclear etiology and was started on  Lasix, Admitted 08/03/20 with progressive weakness , significantly low potassium of 2.   OT comments  Pt pleasant, cooperative and works hard. He continues to demonstrate B UE and LE ataxia, impaired cognition and poor standing balance. Focus of session on sit<>stand, standing balance using stedy as RW proved to be too unstable. Continue to recommend CIR.  Follow Up Recommendations  CIR;Supervision/Assistance - 24 hour    Equipment Recommendations  3 in 1 bedside commode;Wheelchair (measurements OT);Wheelchair cushion (measurements OT)    Recommendations for Other Services      Precautions / Restrictions Precautions Precautions: Fall Precaution Comments: not safe to leave up in chair       Mobility Bed Mobility Overal bed mobility: Needs Assistance Bed Mobility: Rolling;Sidelying to Sit;Sit to Sidelying Rolling: Modified independent (Device/Increase time) Sidelying to sit: Supervision     Sit to sidelying: Supervision General bed mobility comments: rolls using rail, no physical assist for bed mobility, increased time for LEs back into bed    Transfers   Equipment used: Rolling walker (2 wheeled);Ambulation equipment used Transfers: Sit to/from Stand Sit to Stand: +2 physical assistance;Mod assist         General transfer comment: stood with RW x 2 from elevated bed with +2 mod assist, pt very unsteady and immediately returns to sitting, worked on sit<>stand with stedy from bed, chair and stedy platforms    Balance Overall balance assessment: Needs assistance Sitting-balance support: Feet  supported;Bilateral upper extremity supported Sitting balance-Leahy Scale: Good Sitting balance - Comments: no LOB with neuro assessment of LEs without UE support   Standing balance support: Bilateral upper extremity supported Standing balance-Leahy Scale: Poor Standing balance comment: reliant on at least one hand on stedy cross bar, attempts to support trunk with cross bar, but is able to stand with min guard assist in stedy                           ADL either performed or assessed with clinical judgement   ADL Overall ADL's : Needs assistance/impaired                 Upper Body Dressing : Minimal assistance;Sitting Upper Body Dressing Details (indicate cue type and reason): unable to tie due ataxia Lower Body Dressing: Total assistance;Bed level                       Vision       Perception     Praxis      Cognition Arousal/Alertness: Awake/alert Behavior During Therapy: WFL for tasks assessed/performed Overall Cognitive Status: Impaired/Different from baseline Area of Impairment: Memory;Safety/judgement;Awareness;Problem solving;Following commands                     Memory: Decreased short-term memory;Decreased recall of precautions Following Commands: Follows one step commands with increased time Safety/Judgement: Decreased awareness of safety;Decreased awareness of deficits Awareness: Emergent Problem Solving: Slow processing          Exercises     Shoulder Instructions       General Comments      Pertinent Vitals/ Pain  Pain Assessment: No/denies pain  Home Living                                          Prior Functioning/Environment              Frequency  Min 2X/week        Progress Toward Goals  OT Goals(current goals can now be found in the care plan section)  Progress towards OT goals: Progressing toward goals  Acute Rehab OT Goals Patient Stated Goal: to get better OT  Goal Formulation: With patient Time For Goal Achievement: 08/20/20 Potential to Achieve Goals: Good  Plan Discharge plan remains appropriate    Co-evaluation    PT/OT/SLP Co-Evaluation/Treatment: Yes Reason for Co-Treatment: For patient/therapist safety   OT goals addressed during session: ADL's and self-care;Strengthening/ROM      AM-PAC OT "6 Clicks" Daily Activity     Outcome Measure   Help from another person eating meals?: None Help from another person taking care of personal grooming?: A Little Help from another person toileting, which includes using toliet, bedpan, or urinal?: Total Help from another person bathing (including washing, rinsing, drying)?: A Lot Help from another person to put on and taking off regular upper body clothing?: A Little Help from another person to put on and taking off regular lower body clothing?: Total 6 Click Score: 14    End of Session Equipment Utilized During Treatment: Gait belt  OT Visit Diagnosis: Other abnormalities of gait and mobility (R26.89);History of falling (Z91.81);Muscle weakness (generalized) (M62.81);Other symptoms and signs involving the nervous system (R29.898);Other symptoms and signs involving cognitive function   Activity Tolerance Patient tolerated treatment well   Patient Left in bed;with call bell/phone within reach;with bed alarm set   Nurse Communication Need for lift equipment        Time: 1416-1450 OT Time Calculation (min): 34 min  Charges: OT General Charges $OT Visit: 1 Visit OT Treatments $Therapeutic Activity: 8-22 mins  Martie Round, OTR/L Acute Rehabilitation Services Pager: [phone] Office: [phone]   Harold Flores 08/09/2020, 4:01 PM

## 2020-08-09 NOTE — Assessment & Plan Note (Signed)
-  Continue amlodipine and Coreg 

## 2020-08-10 ENCOUNTER — Telehealth: Payer: Self-pay

## 2020-08-10 DIAGNOSIS — R29898 Other symptoms and signs involving the musculoskeletal system: Secondary | ICD-10-CM

## 2020-08-10 LAB — COMPREHENSIVE METABOLIC PANEL
ALT: 25 U/L (ref 0–44)
AST: 41 U/L (ref 15–41)
Albumin: 2.3 g/dL — ABNORMAL LOW (ref 3.5–5.0)
Alkaline Phosphatase: 76 U/L (ref 38–126)
Anion gap: 11 (ref 5–15)
BUN: 5 mg/dL — ABNORMAL LOW (ref 6–20)
CO2: 26 mmol/L (ref 22–32)
Calcium: 8.4 mg/dL — ABNORMAL LOW (ref 8.9–10.3)
Chloride: 95 mmol/L — ABNORMAL LOW (ref 98–111)
Creatinine, Ser: 0.89 mg/dL (ref 0.61–1.24)
GFR, Estimated: >60 mL/min (ref >60)
Glucose, Bld: 91 mg/dL (ref 70–99)
Potassium: 4.1 mmol/L (ref 3.5–5.1)
Sodium: 132 mmol/L — ABNORMAL LOW (ref 135–145)
Total Bilirubin: 1.1 mg/dL (ref 0.3–1.2)
Total Protein: 6.5 g/dL (ref 6.5–8.1)

## 2020-08-10 LAB — QUANTIFERON-TB GOLD PLUS (RQFGPL)
QuantiFERON Mitogen Value: 0.26 IU/mL
QuantiFERON Nil Value: 0.05 IU/mL
QuantiFERON TB1 Ag Value: 0.05 IU/mL
QuantiFERON TB2 Ag Value: 0.04 IU/mL

## 2020-08-10 LAB — CBC WITH DIFFERENTIAL/PLATELET
Abs Immature Granulocytes: 0.13 10*3/uL — ABNORMAL HIGH (ref 0.00–0.07)
Basophils Absolute: 0 10*3/uL (ref 0.0–0.1)
Basophils Relative: 0 %
Eosinophils Absolute: 0.1 10*3/uL (ref 0.0–0.5)
Eosinophils Relative: 2 %
HCT: 24.5 % — ABNORMAL LOW (ref 39.0–52.0)
Hemoglobin: 8.4 g/dL — ABNORMAL LOW (ref 13.0–17.0)
Immature Granulocytes: 2 %
Lymphocytes Relative: 15 %
Lymphs Abs: 1 10*3/uL (ref 0.7–4.0)
MCH: 31.6 pg (ref 26.0–34.0)
MCHC: 34.3 g/dL (ref 30.0–36.0)
MCV: 92.1 fL (ref 80.0–100.0)
Monocytes Absolute: 0.8 10*3/uL (ref 0.1–1.0)
Monocytes Relative: 13 %
Neutro Abs: 4.6 10*3/uL (ref 1.7–7.7)
Neutrophils Relative %: 68 %
Platelets: 254 10*3/uL (ref 150–400)
RBC: 2.66 MIL/uL — ABNORMAL LOW (ref 4.22–5.81)
RDW: 18.1 % — ABNORMAL HIGH (ref 11.5–15.5)
WBC: 6.7 10*3/uL (ref 4.0–10.5)
nRBC: 0 % (ref 0.0–0.2)

## 2020-08-10 LAB — QUANTIFERON-TB GOLD PLUS: QuantiFERON-TB Gold Plus: UNDETERMINED — AB

## 2020-08-10 LAB — JC VIRUS, PCR CSF: JC Virus PCR, CSF: NEGATIVE

## 2020-08-10 LAB — MAGNESIUM: Magnesium: 1.9 mg/dL (ref 1.7–2.4)

## 2020-08-10 MED ORDER — GABAPENTIN 300 MG PO CAPS
300.0000 mg | ORAL_CAPSULE | Freq: Three times a day (TID) | ORAL | Status: DC
  Administered 2020-08-10 – 2020-08-13 (×10): 300 mg via ORAL
  Filled 2020-08-10 (×9): qty 1

## 2020-08-10 MED ORDER — GABAPENTIN 600 MG PO TABS
300.0000 mg | ORAL_TABLET | Freq: Three times a day (TID) | ORAL | Status: DC
  Filled 2020-08-10 (×3): qty 0.5

## 2020-08-10 NOTE — Progress Notes (Signed)
[facility]  Date of Admission:  08/03/2020      Total days of antibiotics   Day 2 ganciclovir   Day 2 IVIG           ASSESSMENT: Harold Flores is a 30 y.o. male with advanced HIV, AIDS status + (VL 104,000, CD4 40) off antiretroviral treatment for several years now here for evaluation of weakness and falls in the lower legs and weight loss.  MRI of the head and entire spine is unremarkable for any process. LP so far with all unremarkable findings as detailed below but some testing remaining.   Today his condition appears to be worse compared to a few days ago. Numbness and tingling described up to the knees. Unable to raise legs off bed today for [place] and cannot dorsiflex either foot to gravity.  Neurology following and appreciate assistance with his condition. He is receiving empiric IV Ganciclovir until CSF CMV studies result for possible CMV polyradiculopathy (although thought to be less likely w/ LP indices). He is also receiving IVIG for potential Guillan-Barre syndrome (although not all features fit) his condition has progressed.  No prohibiting opportunistic process identified to defer ARVs - he has been started on biktarvy and noticed some diarrhea with this. Will add imodium PRN to help with s/e. Counseled that this is very likely to improve with a few weeks.   Pertinent Tests : Folate 3.4 B12 726 Thiamine  CSF WBC 1, RBC 1, Protein 22, Glucose 50 CSF vdrl - negative HSV PCR, CSF - negative for 1 & 2 Varicella, CSF - negative  JC virus, CSF - negative  Cryptococcal Ag, CSF and Serum - negative  MTB PCR, CSF - pending CMV PCR, CSF - pending CMV PCR, serum - positive but low level virus < 200 copies     PLAN: 1. Continue IV ganciclovir  2. CBC to be monitored for s/e  3. Follow pending studies  4. Continue bactrim for prophylaxis 5. Continue Biktarvy    Principal Problem:   Acute metabolic encephalopathy Active Problems:    GAD (generalized anxiety disorder)   Essential hypertension, benign   Gastroesophageal reflux disease without esophagitis   AIDS (acquired immune deficiency syndrome) (HCC)   Hypokalemia   Lower extremity weakness   Alcohol abuse   Normocytic anemia   Diarrhea   Hypomagnesemia   Hyponatremia   Folic acid deficiency   . amLODipine  10 mg Oral Daily  . bictegravir-emtricitabine-tenofovir AF  1 tablet Oral Daily  . carvedilol  6.25 mg Oral BID WC  . [START ON 08/14/2020] folic acid  1 mg Oral Daily  . gabapentin  300 mg Oral TID  . Gerhardt's butt cream   Topical TID  . multivitamin with minerals  1 tablet Oral Daily  . sertraline  100 mg Oral Daily  . sulfamethoxazole-trimethoprim  1 tablet Oral Daily    SUBJECTIVE: Feels OK today. No new concerns aside from what he should be doing to help with his strength overall.  Trouble with dorsiflexion of both feet and feels they are both very heavy. Equal bilateral senses from what he can tell. Says he was able to feed himself breakfast.    Review of Systems: Review of Systems  Constitutional: Negative for chills and fever.  Respiratory: Negative for cough.   Cardiovascular: Negative for chest pain.  Genitourinary: Negative for dysuria.  Musculoskeletal: Negative for back pain and neck pain.  Neurological: Positive  for tingling, focal weakness and weakness. Negative for dizziness, seizures and headaches.  Psychiatric/Behavioral: The patient is not nervous/anxious.     No Known Allergies  OBJECTIVE: Vitals:   08/10/20 0001 08/10/20 0016 08/10/20 0356 08/10/20 0842  BP: (!) 144/95 (!) 141/84 137/81 (!) 146/94  Pulse: (!) 110 (!) 107 (!) 109 (!) 102  Resp: 19 18 18 16   Temp: 98.4 F (36.9 C) 98.6 F (37 C) 99.8 F (37.7 C) 98.4 F (36.9 C)  TempSrc: Oral Oral Oral Oral  SpO2: 100% 99% 100% 99%  Weight:      Height:       Body mass index is 26.19 kg/m.  Physical Exam Constitutional:      Comments: Resting comfortably  in bed.   HENT:     Mouth/Throat:     Mouth: Mucous membranes are moist.  Eyes:     General: No scleral icterus.    Extraocular Movements: Extraocular movements intact.     Pupils: Pupils are equal, round, and reactive to light.     Comments: L eye appears to deviate a bit towards lateral canthus (normal per his report)   Cardiovascular:     Rate and Rhythm: Normal rate.     Pulses: Normal pulses.     Heart sounds: No murmur heard.   Pulmonary:     Effort: Pulmonary effort is normal.     Breath sounds: Normal breath sounds.  Abdominal:     General: Bowel sounds are normal.     Palpations: Abdomen is soft.  Musculoskeletal:        General: No swelling or tenderness.  Skin:    General: Skin is warm and dry.     Capillary Refill: Capillary refill takes less than 2 seconds.     Findings: No rash.  Neurological:     Mental Status: He is alert.     Sensory: No sensory deficit.     Motor: Weakness present.     Coordination: Coordination abnormal.     Lab Results Lab Results  Component Value Date   WBC 6.7 08/10/2020   HGB 8.4 (L) 08/10/2020   HCT 24.5 (L) 08/10/2020   MCV 92.1 08/10/2020   PLT 254 08/10/2020    Lab Results  Component Value Date   CREATININE 0.89 08/10/2020   BUN 5 (L) 08/10/2020   NA 132 (L) 08/10/2020   K 4.1 08/10/2020   CL 95 (L) 08/10/2020   CO2 26 08/10/2020    Lab Results  Component Value Date   ALT 25 08/10/2020   AST 41 08/10/2020   ALKPHOS 76 08/10/2020   BILITOT 1.1 08/10/2020     Microbiology: Recent Results (from the past 240 hour(s))  SARS CORONAVIRUS 2 (TAT 6-24 HRS) Nasopharyngeal Nasopharyngeal Swab     Status: None   Collection Time: 08/03/20 10:00 PM   Specimen: Nasopharyngeal Swab  Result Value Ref Range Status   SARS Coronavirus 2 NEGATIVE NEGATIVE Final    Comment: (NOTE) SARS-CoV-2 target nucleic acids are NOT DETECTED.  The SARS-CoV-2 RNA is generally detectable in upper and lower respiratory specimens during the  acute phase of infection. Negative results do not preclude SARS-CoV-2 infection, do not rule out co-infections with other pathogens, and should not be used as the sole basis for treatment or other patient management decisions. Negative results must be combined with clinical observations, patient history, and epidemiological information. The expected result is Negative.  Fact Sheet for Patients: 08/05/20  Fact Sheet for Healthcare Providers: HairSlick.no  This test is not yet approved or cleared by the [place] FDA and  has been authorized for detection and/or diagnosis of SARS-CoV-2 by FDA under an Emergency Use Authorization (EUA). This EUA will remain  in effect (meaning this test can be used) for the duration of the COVID-19 declaration under Se ction 564(b)(1) of the Act, 21 U.S.C. section 360bbb-3(b)(1), unless the authorization is terminated or revoked sooner.  Performed at [facility], [address] [place], [place] [address]   Gastrointestinal Panel by PCR , Stool     Status: None   Collection Time: 08/06/20  4:59 PM   Specimen: Stool  Result Value Ref Range Status   Campylobacter species NOT DETECTED NOT DETECTED Final   Plesimonas shigelloides NOT DETECTED NOT DETECTED Final   Salmonella species NOT DETECTED NOT DETECTED Final   Yersinia enterocolitica NOT DETECTED NOT DETECTED Final   Vibrio species NOT DETECTED NOT DETECTED Final   Vibrio cholerae NOT DETECTED NOT DETECTED Final   Enteroaggregative E coli (EAEC) NOT DETECTED NOT DETECTED Final   Enteropathogenic E coli (EPEC) NOT DETECTED NOT DETECTED Final   Enterotoxigenic E coli (ETEC) NOT DETECTED NOT DETECTED Final   Shiga like toxin producing E coli (STEC) NOT DETECTED NOT DETECTED Final   Shigella/Enteroinvasive E coli (EIEC) NOT DETECTED NOT DETECTED Final   Cryptosporidium NOT DETECTED NOT DETECTED Final   Cyclospora cayetanensis  NOT DETECTED NOT DETECTED Final   Entamoeba histolytica NOT DETECTED NOT DETECTED Final   Giardia lamblia NOT DETECTED NOT DETECTED Final   Adenovirus F40/41 NOT DETECTED NOT DETECTED Final   Astrovirus NOT DETECTED NOT DETECTED Final   Norovirus GI/GII NOT DETECTED NOT DETECTED Final   Rotavirus A NOT DETECTED NOT DETECTED Final   Sapovirus (I, II, IV, and V) NOT DETECTED NOT DETECTED Final    Comment: Performed at [facility], [address] [place], [place] [address]  Culture, fungus without smear     Status: None (Preliminary result)   Collection Time: 08/07/20  1:28 PM   Specimen: CSF; Cerebrospinal Fluid  Result Value Ref Range Status   Specimen Description CSF  Final   Special Requests Immunocompromised  Final   Culture   Final    NO FUNGUS ISOLATED AFTER 2 DAYS Performed at [facility], [address] [place], [place] [address]    Report Status PENDING  Incomplete  CSF culture     Status: None (Preliminary result)   Collection Time: 08/07/20  1:28 PM   Specimen: CSF; Cerebrospinal Fluid  Result Value Ref Range Status   Specimen Description CSF  Final   Special Requests Immunocompromised  Final   Gram Stain NO WBC SEEN NO ORGANISMS SEEN   Final   Culture   Final    NO GROWTH 2 DAYS Performed at [facility], [address] [place], [place] [address]    Report Status PENDING  Incomplete  MRSA PCR Screening     Status: None   Collection Time: 08/08/20  5:34 AM   Specimen: Nasopharyngeal  Result Value Ref Range Status   MRSA by PCR NEGATIVE NEGATIVE Final    Comment:        The GeneXpert MRSA Assay (FDA approved for NASAL specimens only), is one component of a comprehensive MRSA colonization surveillance program. It is not intended to diagnose MRSA infection nor to guide or monitor treatment for MRSA infections. Performed at [facility], [address] [place], [place]  [address]      Rexene Alberts, MSN, NP-C [facility]  [email or web address] Pager: [phone] Office: [phone] [facility] Main Line: [phone]

## 2020-08-10 NOTE — Plan of Care (Signed)
  Problem: Elimination: Goal: Will not experience complications related to bowel motility Outcome: Progressing Goal: Will not experience complications related to urinary retention Outcome: Progressing   Problem: Safety: Goal: Ability to remain free from injury will improve Outcome: Progressing   

## 2020-08-10 NOTE — Progress Notes (Signed)
Physical Therapy Treatment Patient Details Name: Harold Flores MRN: [id number] DOB: January 15, 1991 Today's Date: 08/10/2020    History of Present Illness 30 year old gentleman prior history of HIV, noncompliant to medications, essential hypertension, alcohol abuse, generalized anxiety disorder, GERD recently had lower extremity edema of unclear etiology and was started on  Lasix, Admitted 08/03/20 with progressive weakness , significantly low potassium of 2.    PT Comments    Pt more lucid this session.  Performed sit to stand in sara stedy with no signs of knee buckling.  Plan next session to progression to gt training.  Pt will require close chair follow for safety. Continue to recommend aggressive rehab in a post acute setting to maximize functional gains before returning home.    Follow Up Recommendations  CIR     Equipment Recommendations  Wheelchair (measurements PT);Wheelchair cushion (measurements PT);Rolling walker with 5" wheels    Recommendations for Other Services       Precautions / Restrictions Precautions Precautions: Fall Precaution Comments: reports B buckling in stance but not observed this session. Restrictions Weight Bearing Restrictions: No    Mobility  Bed Mobility Overal bed mobility: Needs Assistance Bed Mobility: Rolling;Sidelying to Sit Rolling: Supervision Sidelying to sit: Supervision       General bed mobility comments: Pt able to move LEs to edge of bed with commands and elevate trunk into sitting.    Transfers Overall transfer level: Needs assistance Equipment used: Ambulation equipment used Transfers: Sit to/from Stand Sit to Stand: Min assist         General transfer comment: Min assistance to rise into standing with sara stedy.  Plan next session for progression to standing with RW.  Cues for hand placement. LE use and forward weight shifting.  Ambulation/Gait                 Stairs             Wheelchair Mobility     Modified Rankin (Stroke Patients Only)       Balance Overall balance assessment: Needs assistance Sitting-balance support: Feet supported;Bilateral upper extremity supported Sitting balance-Leahy Scale: Good Sitting balance - Comments: no LOB with neuro assessment of LEs without UE support     Standing balance-Leahy Scale: Poor Standing balance comment: min guard for safety.                            Cognition Arousal/Alertness: Awake/alert Behavior During Therapy: WFL for tasks assessed/performed;Anxious Overall Cognitive Status: Within Functional Limits for tasks assessed                                        Exercises General Exercises - Lower Extremity Long Arc Quad: AROM;Both;10 reps;Seated Hip Flexion/Marching: AROM;Both;10 reps;Seated    General Comments        Pertinent Vitals/Pain Pain Score: 6  Pain Location: B hands and feet Pain Descriptors / Indicators: Tingling;Burning Pain Intervention(s): Monitored during session;Repositioned    Home Living                      Prior Function            PT Goals (current goals can now be found in the care plan section) Acute Rehab PT Goals Patient Stated Goal: to get better Potential to Achieve Goals: Fair Additional Goals Additional Goal #  1: propel Wc x 50' with supervision Progress towards PT goals: Progressing toward goals    Frequency    Min 3X/week      PT Plan Current plan remains appropriate    Co-evaluation              AM-PAC PT "6 Clicks" Mobility   Outcome Measure  Help needed turning from your back to your side while in a flat bed without using bedrails?: A Little Help needed moving from lying on your back to sitting on the side of a flat bed without using bedrails?: A Little Help needed moving to and from a bed to a chair (including a wheelchair)?: A Little Help needed standing up from a chair using your arms (e.g., wheelchair or bedside  chair)?: A Little Help needed to walk in hospital room?: A Lot Help needed climbing 3-5 steps with a railing? : A Lot 6 Click Score: 16    End of Session Equipment Utilized During Treatment: Gait belt Activity Tolerance: Patient tolerated treatment well Patient left: in chair;with call bell/phone within reach;with chair alarm set Nurse Communication: Mobility status;Need for lift equipment PT Visit Diagnosis: Muscle weakness (generalized) (M62.81);Other symptoms and signs involving the nervous system (R29.898);Repeated falls (R29.6)     Time: 2774-1287 PT Time Calculation (min) (ACUTE ONLY): 30 min  Charges:  $Therapeutic Exercise: 8-22 mins $Therapeutic Activity: 8-22 mins                     Bonney Leitz , PTA Acute Rehabilitation Services Pager [phone] Office [phone]     Cashtyn Pouliot Artis Delay 08/10/2020, 5:36 PM

## 2020-08-10 NOTE — Telephone Encounter (Signed)
[facility] Patient Advocate Encounter  Completed and sent [employer] Advancing Access application for [employer] for this patient who is uninsured.    Patient is approved 08/09/20 through 08/09/21.  BIN      [id number] PCN    [id number] [id number]    [id number] ID        [id number]   Clearance Coots, CPhT Specialty Pharmacy Patient [facility] Phone: [phone] Fax:  [phone]

## 2020-08-10 NOTE — Progress Notes (Signed)
Inpatient Rehab Admissions Coordinator:   Met with patient at bedside to discuss goals/expectations of CIR stay.  Pt interested in CIR program and we discussed likely supervision level goals with average length of stay to be about 2 weeks.  Pt is open to this and we discussed getting estimate for CIR stay and getting screened for medicaid. Notified [employer] to call pt's spouse.  Will follow for potential admission pending progress with therapy while on IVIG.    Shann Medal, PT, DPT Admissions Coordinator [phone] 08/10/20  3:31 PM

## 2020-08-10 NOTE — Progress Notes (Signed)
**Note Harold-Identified via Obfuscation** PROGRESS NOTE    [name]   WUJ:[id number]  DOB: 03/10/1991  DOA: 08/03/2020     7  PCP: Etta Grandchild, MD  CC: weakness, AMS  Hospital Course: Harold Flores is a 30 year old male with PMH HIV, noncompliant to medications, essential hypertension, alcohol abuse, generalized anxiety disorder, GERD recently had lower extremity edema of unclear etiology and was started on Lasix following which patient reports not feeling well with generalized weakness.   The swelling improved after Lasix but on arrival to ED he was found to have significantly low potassium.  He was admitted to [facility] for further evaluation and management of severe hypokalemia and lower extremity edema.     On further discussions with the patient's husband, his symptoms have been ongoing for approximately 1 month. He has been non compliant to his HIV meds.  He had then developed multiple loose stools with intermittent confusion and hallucinations. Further collateral information was provided from his mother who also reported that he under reports his amount of drinking.  She states that he drinks an excessive amount of alcohol daily at home.    ID was also consulted given his confusion and concern for possible underlying infectious etiology.  He was recommended for undergoing full back MRI as well as brain MRI and LP.  Imaging studies were negative for any pathology including brain MRI.  LP was also obtained on 08/08/2020 which had normal opening pressure and negative for signs of infection.  Further studies still in process.  He continued to exhibit intermittent episodes of confusion during hospitalization. Studies were reviewed by neurology on 08/09/2020.  Decision was made to start patient on IVIG as well as ganciclovir in conjunction with discussion with ID.  He was also placed on high-dose folic acid and thiamine supplementations.  Interval History:  He is actually more awake/alert today compared to yesterday which was  likely the ativan he had as well as possible lingering anesthesia. His encephalopathy in general has been intermittent it seems. Regardless, his LE still remains very weak in bed this am.   ROS: Constitutional: negative for chills and fevers, Respiratory: negative for cough, Cardiovascular: negative for chest pain and Gastrointestinal: negative for abdominal pain  Assessment & Plan: * Acute metabolic encephalopathy - patient symptoms include delirium, hallucinations - etiology considered due to metabolic possibly from infectious source however still may be due to etoh use as well - follow up B1 level, still in process - MRIs are negative as well as prelim results from LP; still other tests pending - continue treating with IVIG, ganciclovir, and high-dose thiamine/folic acid and monitor mentation response  Alcohol abuse - evening of 3/1 appeared to have further withdrawal symptoms and was given Ativan under CIWA protocol. -Continue CIWA for now; scores now downtrending -Continue folate, thiamine, multivitamin  Lower extremity weakness - PT/OT recommending CIR - etiology still u/k; possibly severe deconditioning from his excessive alcohol use versus infectious etiology from underlying uncontrolled HIV (followed by neurology and ID) - patient started on IVIG (cannot rule out GBS at this time) per neurology; 5 day course -Continue with PT while hospitalized as patient is able - MRI C/TL spine unremarkable to explain weakness; MRI brain also unremarkable  - continue folic acid  Folic acid deficiency - level 3.4 - has been on oral folate but also having diarrhea on admission - given some concern for possible malabsorption as well as concern for myelopathy/persistent LE weakness will initiate on high dose course of IV folic acid  followed by oral again (discussed with neurology as well)  AIDS (acquired immune deficiency syndrome) (HCC) - Non compliant to meds. CD4 absolute count is 40, HIV  RNA 104,000  - ID following -Biktarvy being started per ID -Bactrim also started for PJP ppx  Diarrhea -Negative GI pathogen panel -As he has continued to have withdrawal symptoms, diarrhea may be related to ongoing alcohol withdrawal (could also be seen in CMV)  Normocytic anemia - downtrend from 2021 (prior 13-15 g/dL), now downtrending further -Iron stores adequate. -Folate low, 3.4 -Vitamin B12 726  Hyponatremia -Consider due to diuresis prior to admission -Has been improving  Hypomagnesemia -Likely from GI losses with diarrhea -Replete and recheck as needed  Hypokalemia - repleting as needed  Essential hypertension, benign -Continue amlodipine and Coreg  GAD (generalized anxiety disorder) -Continue Zoloft  Elevated troponin-resolved as of 08/09/2020 Pt denies any chest pain or sob.  ? From severe hypokalemia , anemia and demand ischemia.  ekg shows sinus tachycardia with t wave inversions in the lateral leads. Echocardiogram no significant abnormalities.  CT angio of the chest negative for PE.  Cardiology consulted for elevated troponins, recommended no further work-up.  Old records reviewed in assessment of this patient  Antimicrobials: Biktarvy 08/09/2020 >> current IVIG 08/09/2020 >> current  Ganciclovir 08/09/2020 >> current  DVT prophylaxis: SCDs Start: 08/03/20 2229   Code Status:   Code Status: Full Code Family Communication: mother   Disposition Plan: Status is: Inpatient  Remains inpatient appropriate because:Ongoing diagnostic testing needed not appropriate for outpatient work up, IV treatments appropriate due to intensity of illness or inability to take PO and Inpatient level of care appropriate due to severity of illness   Dispo: The patient is from: Home              Anticipated d/c is to: possibly CIR              Patient currently is not medically stable to d/c.   Difficult to place patient No Risk of unplanned readmission score: Unplanned  Admission- Pilot do not use: 11.77   Objective: Blood pressure (!) 146/94, pulse (!) 102, temperature 98.4 F (36.9 C), temperature source Oral, resp. rate 16, height  (1.778 m), weight 82.8 kg, SpO2 99 %.  Examination: General appearance: laying in bed naked but is more awake and able to talk some without falling asleep this morning; mentation still slow Head: Normocephalic, without obvious abnormality, atraumatic Eyes: EOMI Lungs: clear to auscultation bilaterally Heart: regular rate and rhythm and S1, S2 normal Abdomen: normal findings: bowel sounds normal and soft, non-tender Extremities: no edema Skin: mobility and turgor normal Neurologic: slowed mentation; 4/5 RUE strength. B/L LE strength 2/5  Consultants:   ID  IR  Neurology   Procedures:   LP, 3/1  Data Reviewed: I have personally reviewed following labs and imaging studies Results for orders placed or performed during the hospital encounter of 08/03/20 (from the past 24 hour(s))  CBC with Differential/Platelet     Status: Abnormal   Collection Time: 08/10/20  1:33 AM  Result Value Ref Range   WBC 6.7 4.0 - 10.5 K/uL   RBC 2.66 (L) 4.22 - 5.81 MIL/uL   Hemoglobin 8.4 (L) 13.0 - 17.0 g/dL   HCT 16.1 (L) 09.6 - 04.5 %   MCV 92.1 80.0 - 100.0 fL   MCH 31.6 26.0 - 34.0 pg   MCHC 34.3 30.0 - 36.0 g/dL   RDW 40.9 (H) 81.1 - 91.4 %  Platelets 254 150 - 400 K/uL   nRBC 0.0 0.0 - 0.2 %   Neutrophils Relative % 68 %   Neutro Abs 4.6 1.7 - 7.7 K/uL   Lymphocytes Relative 15 %   Lymphs Abs 1.0 0.7 - 4.0 K/uL   Monocytes Relative 13 %   Monocytes Absolute 0.8 0.1 - 1.0 K/uL   Eosinophils Relative 2 %   Eosinophils Absolute 0.1 0.0 - 0.5 K/uL   Basophils Relative 0 %   Basophils Absolute 0.0 0.0 - 0.1 K/uL   Immature Granulocytes 2 %   Abs Immature Granulocytes 0.13 (H) 0.00 - 0.07 K/uL  Magnesium     Status: None   Collection Time: 08/10/20  1:33 AM  Result Value Ref Range   Magnesium 1.9 1.7 - 2.4 mg/dL   Comprehensive metabolic panel     Status: Abnormal   Collection Time: 08/10/20  1:33 AM  Result Value Ref Range   Sodium 132 (L) 135 - 145 mmol/L   Potassium 4.1 3.5 - 5.1 mmol/L   Chloride 95 (L) 98 - 111 mmol/L   CO2 26 22 - 32 mmol/L   Glucose, Bld 91 70 - 99 mg/dL   BUN 5 (L) 6 - 20 mg/dL   Creatinine, Ser 1.61 0.61 - 1.24 mg/dL   Calcium 8.4 (L) 8.9 - 10.3 mg/dL   Total Protein 6.5 6.5 - 8.1 g/dL   Albumin 2.3 (L) 3.5 - 5.0 g/dL   AST 41 15 - 41 U/L   ALT 25 0 - 44 U/L   Alkaline Phosphatase 76 38 - 126 U/L   Total Bilirubin 1.1 0.3 - 1.2 mg/dL   GFR, Estimated >09 >60 mL/min   Anion gap 11 5 - 15    Recent Results (from the past 240 hour(s))  SARS CORONAVIRUS 2 (TAT 6-24 HRS) Nasopharyngeal Nasopharyngeal Swab     Status: None   Collection Time: 08/03/20 10:00 PM   Specimen: Nasopharyngeal Swab  Result Value Ref Range Status   SARS Coronavirus 2 NEGATIVE NEGATIVE Final    Comment: (NOTE) SARS-CoV-2 target nucleic acids are NOT DETECTED.  The SARS-CoV-2 RNA is generally detectable in upper and lower respiratory specimens during the acute phase of infection. Negative results do not preclude SARS-CoV-2 infection, do not rule out co-infections with other pathogens, and should not be used as the sole basis for treatment or other patient management decisions. Negative results must be combined with clinical observations, patient history, and epidemiological information. The expected result is Negative.  Fact Sheet for Patients: HairSlick.no  Fact Sheet for Healthcare Providers: quierodirigir.com  This test is not yet approved or cleared by the [place] FDA and  has been authorized for detection and/or diagnosis of SARS-CoV-2 by FDA under an Emergency Use Authorization (EUA). This EUA will remain  in effect (meaning this test can be used) for the duration of the COVID-19 declaration under Se ction 564(b)(1) of  the Act, 21 U.S.C. section 360bbb-3(b)(1), unless the authorization is terminated or revoked sooner.  Performed at [facility], [address] [place], [place] [address]   Gastrointestinal Panel by PCR , Stool     Status: None   Collection Time: 08/06/20  4:59 PM   Specimen: Stool  Result Value Ref Range Status   Campylobacter species NOT DETECTED NOT DETECTED Final   Plesimonas shigelloides NOT DETECTED NOT DETECTED Final   Salmonella species NOT DETECTED NOT DETECTED Final   Yersinia enterocolitica NOT DETECTED NOT DETECTED Final   Vibrio  species NOT DETECTED NOT DETECTED Final   Vibrio cholerae NOT DETECTED NOT DETECTED Final   Enteroaggregative E coli (EAEC) NOT DETECTED NOT DETECTED Final   Enteropathogenic E coli (EPEC) NOT DETECTED NOT DETECTED Final   Enterotoxigenic E coli (ETEC) NOT DETECTED NOT DETECTED Final   Shiga like toxin producing E coli (STEC) NOT DETECTED NOT DETECTED Final   Shigella/Enteroinvasive E coli (EIEC) NOT DETECTED NOT DETECTED Final   Cryptosporidium NOT DETECTED NOT DETECTED Final   Cyclospora cayetanensis NOT DETECTED NOT DETECTED Final   Entamoeba histolytica NOT DETECTED NOT DETECTED Final   Giardia lamblia NOT DETECTED NOT DETECTED Final   Adenovirus F40/41 NOT DETECTED NOT DETECTED Final   Astrovirus NOT DETECTED NOT DETECTED Final   Norovirus GI/GII NOT DETECTED NOT DETECTED Final   Rotavirus A NOT DETECTED NOT DETECTED Final   Sapovirus (I, II, IV, and V) NOT DETECTED NOT DETECTED Final    Comment: Performed at [facility], [address] [place], [place] [address]  Culture, fungus without smear     Status: None (Preliminary result)   Collection Time: 08/07/20  1:28 PM   Specimen: CSF; Cerebrospinal Fluid  Result Value Ref Range Status   Specimen Description CSF  Final   Special Requests Immunocompromised  Final   Culture   Final    NO FUNGUS ISOLATED AFTER 2 DAYS Performed at [facility], [address]  [place], [place] [address]    Report Status PENDING  Incomplete  CSF culture     Status: None (Preliminary result)   Collection Time: 08/07/20  1:28 PM   Specimen: CSF; Cerebrospinal Fluid  Result Value Ref Range Status   Specimen Description CSF  Final   Special Requests Immunocompromised  Final   Gram Stain NO WBC SEEN NO ORGANISMS SEEN   Final   Culture   Final    NO GROWTH 2 DAYS Performed at [facility], [address], , [place] [address]    Report Status PENDING  Incomplete  MRSA PCR Screening     Status: None   Collection Time: 08/08/20  5:34 AM   Specimen: Nasopharyngeal  Result Value Ref Range Status   MRSA by PCR NEGATIVE NEGATIVE Final    Comment:        The GeneXpert MRSA Assay (FDA approved for NASAL specimens only), is one component of a comprehensive MRSA colonization surveillance program. It is not intended to diagnose MRSA infection nor to guide or monitor treatment for MRSA infections. Performed at [facility], [address], [place], [place] [address]      Radiology Studies: MR BRAIN W WO CONTRAST  Result Date: 08/08/2020 CLINICAL DATA:  Mental status change EXAM: MRI HEAD WITHOUT AND WITH CONTRAST TECHNIQUE: Multiplanar, multiecho pulse sequences of the brain and surrounding structures were obtained without and with intravenous contrast. CONTRAST:  87mL GADAVIST GADOBUTROL 1 MMOL/ML IV SOLN COMPARISON:  None. FINDINGS: Brain: There is no acute infarction or intracranial hemorrhage. There is no intracranial mass, mass effect, or edema. There is no hydrocephalus or extra-axial fluid collection. Minimal small foci of T2 hyperintensity in the supratentorial white matter likely reflect nonspecific gliosis/demyelination. Ventricles and sulci are normal in size and configuration. No abnormal enhancement. Vascular: Major vessel flow voids at the skull base are preserved. Skull and upper cervical spine: Normal marrow signal is preserved.  Sinuses/Orbits: Paranasal sinuses are aerated. Orbits are unremarkable. Other: Sella is unremarkable.  Mastoid air cells are clear. IMPRESSION: No  acute or significant abnormality. Electronically Signed   By: Guadlupe SpanishPraneil  Patel M.D.   On: 08/08/2020 17:43   MR CERVICAL SPINE W WO CONTRAST  Result Date: 08/08/2020 CLINICAL DATA:  Lower extremity weakness EXAM: MRI CERVICAL, THORACIC AND LUMBAR SPINE WITHOUT AND WITH CONTRAST TECHNIQUE: Multiplanar and multiecho pulse sequences of the cervical spine, to include the craniocervical junction and cervicothoracic junction, and thoracic and lumbar spine, were obtained without and with intravenous contrast. CONTRAST:  8mL GADAVIST GADOBUTROL 1 MMOL/ML IV SOLN COMPARISON:  None. FINDINGS: MRI CERVICAL SPINE Alignment: No significant listhesis. Vertebrae: Vertebral body heights are maintained. No marrow edema. Decreased T1 marrow signal probably reflects hematopoietic marrow. Cord: No abnormal signal or enhancement. Posterior Fossa, vertebral arteries, paraspinal tissues: Unremarkable. Disc levels: Intervertebral disc heights and signal are maintained. There is no disc herniation or stenosis at any level. MRI THORACIC SPINE Alignment:  No significant listhesis. Vertebrae: Vertebral body heights are maintained apart from minimal degenerative endplate irregularity at midthoracic levels. No marrow edema. Decreased T1 marrow signal probably reflects hematopoietic marrow. Cord:  No abnormal signal or enhancement. Paraspinal and other soft tissues: Unremarkable. Disc levels: Minimal right paracentral disc protrusion at T6-T7. No canal or foraminal stenosis at any level. MRI LUMBAR SPINE Segmentation:  Standard. Alignment:  No significant listhesis. Vertebrae: This vertebral body heights are maintained. No marrow edema. Decreased T1 marrow signal probably reflects hematopoietic marrow. Conus medullaris and cauda equina: Conus extends to the L1 level. Conus and cauda equina appear  normal. No abnormal intrathecal enhancement. Paraspinal and other soft tissues: Unremarkable. Disc levels: Intervertebral disc heights and signal are maintained. No disc herniation or stenosis at any level. IMPRESSION: No abnormal cord signal, abnormal enhancement, or stenosis. Electronically Signed   By: Guadlupe SpanishPraneil  Patel M.D.   On: 08/08/2020 17:56   MR THORACIC SPINE W WO CONTRAST  Result Date: 08/08/2020 CLINICAL DATA:  Lower extremity weakness EXAM: MRI CERVICAL, THORACIC AND LUMBAR SPINE WITHOUT AND WITH CONTRAST TECHNIQUE: Multiplanar and multiecho pulse sequences of the cervical spine, to include the craniocervical junction and cervicothoracic junction, and thoracic and lumbar spine, were obtained without and with intravenous contrast. CONTRAST:  8mL GADAVIST GADOBUTROL 1 MMOL/ML IV SOLN COMPARISON:  None. FINDINGS: MRI CERVICAL SPINE Alignment: No significant listhesis. Vertebrae: Vertebral body heights are maintained. No marrow edema. Decreased T1 marrow signal probably reflects hematopoietic marrow. Cord: No abnormal signal or enhancement. Posterior Fossa, vertebral arteries, paraspinal tissues: Unremarkable. Disc levels: Intervertebral disc heights and signal are maintained. There is no disc herniation or stenosis at any level. MRI THORACIC SPINE Alignment:  No significant listhesis. Vertebrae: Vertebral body heights are maintained apart from minimal degenerative endplate irregularity at midthoracic levels. No marrow edema. Decreased T1 marrow signal probably reflects hematopoietic marrow. Cord:  No abnormal signal or enhancement. Paraspinal and other soft tissues: Unremarkable. Disc levels: Minimal right paracentral disc protrusion at T6-T7. No canal or foraminal stenosis at any level. MRI LUMBAR SPINE Segmentation:  Standard. Alignment:  No significant listhesis. Vertebrae: This vertebral body heights are maintained. No marrow edema. Decreased T1 marrow signal probably reflects hematopoietic marrow.  Conus medullaris and cauda equina: Conus extends to the L1 level. Conus and cauda equina appear normal. No abnormal intrathecal enhancement. Paraspinal and other soft tissues: Unremarkable. Disc levels: Intervertebral disc heights and signal are maintained. No disc herniation or stenosis at any level. IMPRESSION: No abnormal cord signal, abnormal enhancement, or stenosis. Electronically Signed   By: Guadlupe SpanishPraneil  Patel M.D.   On: 08/08/2020 17:56   MR Lumbar  Spine W Wo Contrast  Result Date: 08/08/2020 CLINICAL DATA:  Lower extremity weakness EXAM: MRI CERVICAL, THORACIC AND LUMBAR SPINE WITHOUT AND WITH CONTRAST TECHNIQUE: Multiplanar and multiecho pulse sequences of the cervical spine, to include the craniocervical junction and cervicothoracic junction, and thoracic and lumbar spine, were obtained without and with intravenous contrast. CONTRAST:  62mL GADAVIST GADOBUTROL 1 MMOL/ML IV SOLN COMPARISON:  None. FINDINGS: MRI CERVICAL SPINE Alignment: No significant listhesis. Vertebrae: Vertebral body heights are maintained. No marrow edema. Decreased T1 marrow signal probably reflects hematopoietic marrow. Cord: No abnormal signal or enhancement. Posterior Fossa, vertebral arteries, paraspinal tissues: Unremarkable. Disc levels: Intervertebral disc heights and signal are maintained. There is no disc herniation or stenosis at any level. MRI THORACIC SPINE Alignment:  No significant listhesis. Vertebrae: Vertebral body heights are maintained apart from minimal degenerative endplate irregularity at midthoracic levels. No marrow edema. Decreased T1 marrow signal probably reflects hematopoietic marrow. Cord:  No abnormal signal or enhancement. Paraspinal and other soft tissues: Unremarkable. Disc levels: Minimal right paracentral disc protrusion at T6-T7. No canal or foraminal stenosis at any level. MRI LUMBAR SPINE Segmentation:  Standard. Alignment:  No significant listhesis. Vertebrae: This vertebral body heights are  maintained. No marrow edema. Decreased T1 marrow signal probably reflects hematopoietic marrow. Conus medullaris and cauda equina: Conus extends to the L1 level. Conus and cauda equina appear normal. No abnormal intrathecal enhancement. Paraspinal and other soft tissues: Unremarkable. Disc levels: Intervertebral disc heights and signal are maintained. No disc herniation or stenosis at any level. IMPRESSION: No abnormal cord signal, abnormal enhancement, or stenosis. Electronically Signed   By: Guadlupe Spanish M.D.   On: 08/08/2020 17:56   MR BRAIN W WO CONTRAST  Final Result    MR CERVICAL SPINE W WO CONTRAST  Final Result    MR Lumbar Spine W Wo Contrast  Final Result    MR THORACIC SPINE W WO CONTRAST  Final Result    DG Abd 1 View  Final Result    CT ANGIO CHEST PE W OR WO CONTRAST  Final Result    DG Chest Port 1 View  Final Result      Scheduled Meds: . amLODipine  10 mg Oral Daily  . bictegravir-emtricitabine-tenofovir AF  1 tablet Oral Daily  . carvedilol  6.25 mg Oral BID WC  . [START ON 08/14/2020] folic acid  1 mg Oral Daily  . gabapentin  300 mg Oral TID  . Gerhardt's butt cream   Topical TID  . multivitamin with minerals  1 tablet Oral Daily  . sertraline  100 mg Oral Daily  . sulfamethoxazole-trimethoprim  1 tablet Oral Daily   PRN Meds: lip balm, loperamide, LORazepam **OR** LORazepam, ondansetron **OR** ondansetron (ZOFRAN) IV Continuous Infusions: . folic acid (FOLVITE) IVPB 5 mg (08/10/20 1224)  . ganciclovir 415 mg (08/10/20 0848)  . Immune Globulin 10% 35 g (08/09/20 2315)  . thiamine injection 500 mg (08/10/20 1117)     LOS: 7 days  Time spent: Greater than 50% of the 35 minute visit was spent in counseling/coordination of care for the patient as laid out in the A&P.   Lewie Chamber, MD [facility] 08/10/2020, 1:08 PM

## 2020-08-10 NOTE — Progress Notes (Signed)
Subjective: No significant changes, + paresthesias in his hands and feet.   Exam: Vitals:   08/10/20 0356 08/10/20 0842  BP: 137/81 (!) 146/94  Pulse: (!) 109 (!) 102  Resp: 18 16  Temp: 99.8 F (37.7 C) 98.4 F (36.9 C)  SpO2: 100% 99%   Gen: In bed, NAD Resp: non-labored breathing, no acute distress Abd: soft, nt  Neuro: MS: awake, alert, interactiv eand appropriate IR:JJOAC, EOMI, no nystagmus Motor: 5/5 in UE, he has 4/5 hip flexion 3/5 knee extension/flexion, 1/5 on th elef tan d0/5 on the right dorsiflexion Sensory:decreased to LT at the ankles, absent vibration at the toes ZYS:AYTKZS in BLE, 1+ at biceps He is ataxic in his upper extremities   Pertinent Labs: Folate 3.4 Thiamine pending CSF WBC 1 RBC 1 Protein 22 Glucose 50 CSF vdrl - negative  HIV RNA quant 104,000, CD4 40 Peripheral CMV +  B12 726  Imaging: MRI imaging of the complete neuraxis is negative with and without contrast, no evidence of nerve root enhancement   Impression: 30 year old male with acute neuropathic process by exam as well as some delirium in the setting of AIDS.  Possible etiologies include Guillain-Barr syndrome (though I would expect elevated protein at this point), CMV polyradiculopathy (though I would expect pleocytosis on LP), folate deficiency neuropathy (though this is typically more sensory), thiamine deficiency (which could explain both confusion and neuropathy).  Folate neuropathy has the unusual distinction of preserving biceps reflexes, but as stated above it is usually more of a sensory neuropathy.  At this point, I do not think I can completely exclude Guillain-Barr syndrome and given that he is still having progression, I do think I would favor treating with IVIG.    CMV polyradiculopathy is less likely given no pleocytosis, but with concern for the possible severity that the syndrome presents, I favored starting treatment as we await his CSF CMV.  I discussed this  with infectious disease and they have started ganciclovir.   Thiamine deficiency is easily treatable, and he has been started on replacement, but I would favor doing a higher dose pending his result.  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  Recommendations: 1) agree with high-dose folate 2) I increased thiamine to 500 mg 3 times daily for 2 days 3) appreciate ID assistance, ganciclovir has been started 4) IVIG total 2 g/kg dose, day 2 today 5) neurology will follow  Ritta Slot, MD [facility] [phone]  If 7pm- 7am, please page neurology on call as listed in AMION.

## 2020-08-11 ENCOUNTER — Other Ambulatory Visit (HOSPITAL_COMMUNITY): Payer: Self-pay

## 2020-08-11 ENCOUNTER — Other Ambulatory Visit: Payer: Self-pay | Admitting: Internal Medicine

## 2020-08-11 DIAGNOSIS — G9340 Encephalopathy, unspecified: Secondary | ICD-10-CM | POA: Insufficient documentation

## 2020-08-11 DIAGNOSIS — R4182 Altered mental status, unspecified: Secondary | ICD-10-CM | POA: Insufficient documentation

## 2020-08-11 DIAGNOSIS — G9341 Metabolic encephalopathy: Secondary | ICD-10-CM

## 2020-08-11 DIAGNOSIS — R40244 Other coma, without documented Glasgow coma scale score, or with partial score reported, unspecified time: Secondary | ICD-10-CM

## 2020-08-11 LAB — CBC WITH DIFFERENTIAL/PLATELET
Abs Immature Granulocytes: 0.11 10*3/uL — ABNORMAL HIGH (ref 0.00–0.07)
Basophils Absolute: 0 10*3/uL (ref 0.0–0.1)
Basophils Relative: 0 %
Eosinophils Absolute: 0.4 10*3/uL (ref 0.0–0.5)
Eosinophils Relative: 5 %
HCT: 24.8 % — ABNORMAL LOW (ref 39.0–52.0)
Hemoglobin: 8.5 g/dL — ABNORMAL LOW (ref 13.0–17.0)
Immature Granulocytes: 1 %
Lymphocytes Relative: 19 %
Lymphs Abs: 1.6 10*3/uL (ref 0.7–4.0)
MCH: 31.5 pg (ref 26.0–34.0)
MCHC: 34.3 g/dL (ref 30.0–36.0)
MCV: 91.9 fL (ref 80.0–100.0)
Monocytes Absolute: 1.1 10*3/uL — ABNORMAL HIGH (ref 0.1–1.0)
Monocytes Relative: 13 %
Neutro Abs: 5.1 10*3/uL (ref 1.7–7.7)
Neutrophils Relative %: 62 %
Platelets: 265 10*3/uL (ref 150–400)
RBC: 2.7 MIL/uL — ABNORMAL LOW (ref 4.22–5.81)
RDW: 17.7 % — ABNORMAL HIGH (ref 11.5–15.5)
WBC: 8.3 10*3/uL (ref 4.0–10.5)
nRBC: 0 % (ref 0.0–0.2)

## 2020-08-11 LAB — COMPREHENSIVE METABOLIC PANEL
ALT: 29 U/L (ref 0–44)
AST: 47 U/L — ABNORMAL HIGH (ref 15–41)
Albumin: 2.2 g/dL — ABNORMAL LOW (ref 3.5–5.0)
Alkaline Phosphatase: 75 U/L (ref 38–126)
Anion gap: 9 (ref 5–15)
BUN: 8 mg/dL (ref 6–20)
CO2: 24 mmol/L (ref 22–32)
Calcium: 8.3 mg/dL — ABNORMAL LOW (ref 8.9–10.3)
Chloride: 96 mmol/L — ABNORMAL LOW (ref 98–111)
Creatinine, Ser: 1 mg/dL (ref 0.61–1.24)
GFR, Estimated: >60 mL/min (ref >60)
Glucose, Bld: 94 mg/dL (ref 70–99)
Potassium: 3.9 mmol/L (ref 3.5–5.1)
Sodium: 129 mmol/L — ABNORMAL LOW (ref 135–145)
Total Bilirubin: 0.9 mg/dL (ref 0.3–1.2)
Total Protein: 6.7 g/dL (ref 6.5–8.1)

## 2020-08-11 LAB — MISC LABCORP TEST (SEND OUT)
Labcorp test code: 138693
Labcorp test code: 138289

## 2020-08-11 LAB — MAGNESIUM: Magnesium: 1.5 mg/dL — ABNORMAL LOW (ref 1.7–2.4)

## 2020-08-11 MED ORDER — SODIUM CHLORIDE 0.9 % IV SOLN
5.0000 mg/kg | Freq: Two times a day (BID) | INTRAVENOUS | Status: DC
  Administered 2020-08-11 – 2020-08-14 (×5): 415 mg via INTRAVENOUS
  Filled 2020-08-11 (×9): qty 8.3

## 2020-08-11 MED ORDER — MAGNESIUM SULFATE 2 GM/50ML IV SOLN
2.0000 g | Freq: Once | INTRAVENOUS | Status: AC
  Administered 2020-08-11: 2 g via INTRAVENOUS
  Filled 2020-08-11: qty 50

## 2020-08-11 MED ORDER — THIAMINE HCL 100 MG PO TABS
100.0000 mg | ORAL_TABLET | Freq: Every day | ORAL | Status: DC
  Administered 2020-08-12 – 2020-08-15 (×4): 100 mg via ORAL
  Filled 2020-08-11 (×4): qty 1

## 2020-08-11 NOTE — Progress Notes (Signed)
PROGRESS NOTE    Harold Flores   NID:[id number]  DOB: 1991-05-17  DOA: 08/03/2020     8  PCP: Etta Grandchild, MD  CC: weakness, AMS  Hospital Course: [name] is a 30 year old male with PMH HIV, noncompliant to medications, essential hypertension, alcohol abuse, generalized anxiety disorder, GERD recently had lower extremity edema of unclear etiology and was started on Lasix following which patient reports not feeling well with generalized weakness.   The swelling improved after Lasix but on arrival to ED he was found to have significantly low potassium.  He was admitted to [facility] for further evaluation and management of severe hypokalemia and lower extremity edema.     On further discussions with the patient's husband, his symptoms have been ongoing for approximately 1 month. He has been non compliant to his HIV meds.  He had then developed multiple loose stools with intermittent confusion and hallucinations. Further collateral information was provided from his mother who also reported that he under reports his amount of drinking.  She states that he drinks an excessive amount of alcohol daily at home.    ID was also consulted given his confusion and concern for possible underlying infectious etiology.  He was recommended for undergoing full back MRI as well as brain MRI and LP.  Imaging studies were negative for any pathology including brain MRI.  LP was also obtained on 08/08/2020 which had normal opening pressure and negative for signs of infection.  Further studies still in process.  He continued to exhibit intermittent episodes of confusion during hospitalization. Studies were reviewed by neurology on 08/09/2020.  Decision was made to start patient on IVIG as well as ganciclovir in conjunction with discussion with ID.  He was also placed on high-dose folic acid and thiamine supplementations.  Interval History:  No events overnight. Appears a little more alert and awake. Stated he  felt "agitated" and had some shaking in his hands and was asking for ativan this morning. Did not appear to be overtly in withdrawal but may still have some lingering symptoms.  Strength in her LE seemed better some too today; was also able to stand with PT yesterday with min assistance.   ROS: Constitutional: negative for chills and fevers, Respiratory: negative for cough, Cardiovascular: negative for chest pain and Gastrointestinal: negative for abdominal pain  Assessment & Plan: * Acute metabolic encephalopathy - patient symptoms include delirium, hallucinations - etiology considered due to metabolic possibly from infectious source however still may be due to etoh use as well - follow up B1 level, still in process - MRIs are negative as well as prelim results from LP; still other tests pending - continue treating with IVIG, ganciclovir, and high-dose thiamine/folic acid and monitor mentation response  Alcohol abuse - evening of 3/1 appeared to have further withdrawal symptoms and was given Ativan under CIWA protocol. -Continue CIWA for now; scores now downtrending; mild agitation/tremor reported per patient this am, 3/4 -Continue folate, thiamine, multivitamin  Lower extremity weakness - PT/OT recommending CIR - etiology still u/k; possibly severe deconditioning from his excessive alcohol use versus infectious etiology from underlying uncontrolled HIV (followed by neurology and ID) - patient started on IVIG (cannot rule out GBS at this time) per neurology; 5 day course -Continue with PT while hospitalized as patient is able - MRI C/TL spine unremarkable to explain weakness; MRI brain also unremarkable  - continue folic acid  Folic acid deficiency - level 3.4 - has been on oral folate but  also having diarrhea on admission - given some concern for possible malabsorption as well as concern for myelopathy/persistent LE weakness will initiate on high dose course of IV folic acid followed  by oral again (discussed with neurology as well)  AIDS (acquired immune deficiency syndrome) (HCC) - Non compliant to meds. CD4 absolute count is 40, HIV RNA 104,000  - ID following -Biktarvy being started per ID -Bactrim also started for PJP ppx  Diarrhea -Negative GI pathogen panel -As he has continued to have withdrawal symptoms, diarrhea may be related to ongoing alcohol withdrawal vs ART vs (could also be seen in CMV)  Normocytic anemia - downtrend from 2021 (prior 13-15 g/dL), now downtrending further -Iron stores adequate. -Folate low, 3.4 -Vitamin B12 726  Hyponatremia -Consider due to diuresis prior to admission -Has been improving  Hypomagnesemia -Likely from GI losses with diarrhea -Replete and recheck as needed  Hypokalemia - repleting as needed  Essential hypertension, benign -Continue amlodipine and Coreg  GAD (generalized anxiety disorder) -Continue Zoloft  Elevated troponin-resolved as of 08/09/2020 Pt denies any chest pain or sob.  ? From severe hypokalemia , anemia and demand ischemia.  ekg shows sinus tachycardia with t wave inversions in the lateral leads. Echocardiogram no significant abnormalities.  CT angio of the chest negative for PE.  Cardiology consulted for elevated troponins, recommended no further work-up.  Old records reviewed in assessment of this patient  Antimicrobials: Biktarvy 08/09/2020 >> current IVIG 08/09/2020 >> current  Ganciclovir 08/09/2020 >> current  DVT prophylaxis: SCDs Start: 08/03/20 2229   Code Status:   Code Status: Full Code Family Communication: mother   Disposition Plan: Status is: Inpatient  Remains inpatient appropriate because:Ongoing diagnostic testing needed not appropriate for outpatient work up, IV treatments appropriate due to intensity of illness or inability to take PO and Inpatient level of care appropriate due to severity of illness   Dispo: The patient is from: Home              Anticipated  d/c is to: possibly CIR              Patient currently is not medically stable to d/c.   Difficult to place patient No Risk of unplanned readmission score: Unplanned Admission- Pilot do not use: 11.95   Objective: Blood pressure 129/73, pulse (!) 103, temperature 98.4 F (36.9 C), temperature source Oral, resp. rate 16, height 5\' 10"  (1.778 m), weight 82.8 kg, SpO2 100 %.  Examination: General appearance: more awake/alert; sitting up in bed dressed now and in NAD Head: Normocephalic, without obvious abnormality, atraumatic Eyes: EOMI Lungs: clear to auscultation bilaterally Heart: regular rate and rhythm and S1, S2 normal Abdomen: normal findings: bowel sounds normal and soft, non-tender Extremities: no edema Skin: mobility and turgor normal Neurologic: slowed mentation; 4/5 RUE strength. B/L LE strength 4/5 (improved form 2/5, still question his effort at times)  Consultants:   ID  IR  Neurology   Procedures:   LP, 3/1  Data Reviewed: I have personally reviewed following labs and imaging studies Results for orders placed or performed during the hospital encounter of 08/03/20 (from the past 24 hour(s))  CBC with Differential/Platelet     Status: Abnormal   Collection Time: 08/11/20  6:12 AM  Result Value Ref Range   WBC 8.3 4.0 - 10.5 K/uL   RBC 2.70 (L) 4.22 - 5.81 MIL/uL   Hemoglobin 8.5 (L) 13.0 - 17.0 g/dL   HCT 57.824.8 (L) 46.939.0 - 62.952.0 %   MCV  91.9 80.0 - 100.0 fL   MCH 31.5 26.0 - 34.0 pg   MCHC 34.3 30.0 - 36.0 g/dL   RDW 82.9 (H) 56.2 - 13.0 %   Platelets 265 150 - 400 K/uL   nRBC 0.0 0.0 - 0.2 %   Neutrophils Relative % 62 %   Neutro Abs 5.1 1.7 - 7.7 K/uL   Lymphocytes Relative 19 %   Lymphs Abs 1.6 0.7 - 4.0 K/uL   Monocytes Relative 13 %   Monocytes Absolute 1.1 (H) 0.1 - 1.0 K/uL   Eosinophils Relative 5 %   Eosinophils Absolute 0.4 0.0 - 0.5 K/uL   Basophils Relative 0 %   Basophils Absolute 0.0 0.0 - 0.1 K/uL   Immature Granulocytes 1 %   Abs  Immature Granulocytes 0.11 (H) 0.00 - 0.07 K/uL  Magnesium     Status: Abnormal   Collection Time: 08/11/20  6:12 AM  Result Value Ref Range   Magnesium 1.5 (L) 1.7 - 2.4 mg/dL  Comprehensive metabolic panel     Status: Abnormal   Collection Time: 08/11/20  6:12 AM  Result Value Ref Range   Sodium 129 (L) 135 - 145 mmol/L   Potassium 3.9 3.5 - 5.1 mmol/L   Chloride 96 (L) 98 - 111 mmol/L   CO2 24 22 - 32 mmol/L   Glucose, Bld 94 70 - 99 mg/dL   BUN 8 6 - 20 mg/dL   Creatinine, Ser 8.65 0.61 - 1.24 mg/dL   Calcium 8.3 (L) 8.9 - 10.3 mg/dL   Total Protein 6.7 6.5 - 8.1 g/dL   Albumin 2.2 (L) 3.5 - 5.0 g/dL   AST 47 (H) 15 - 41 U/L   ALT 29 0 - 44 U/L   Alkaline Phosphatase 75 38 - 126 U/L   Total Bilirubin 0.9 0.3 - 1.2 mg/dL   GFR, Estimated >78 >46 mL/min   Anion gap 9 5 - 15    Recent Results (from the past 240 hour(s))  SARS CORONAVIRUS 2 (TAT 6-24 HRS) Nasopharyngeal Nasopharyngeal Swab     Status: None   Collection Time: 08/03/20 10:00 PM   Specimen: Nasopharyngeal Swab  Result Value Ref Range Status   SARS Coronavirus 2 NEGATIVE NEGATIVE Final    Comment: (NOTE) SARS-CoV-2 target nucleic acids are NOT DETECTED.  The SARS-CoV-2 RNA is generally detectable in upper and lower respiratory specimens during the acute phase of infection. Negative results do not preclude SARS-CoV-2 infection, do not rule out co-infections with other pathogens, and should not be used as the sole basis for treatment or other patient management decisions. Negative results must be combined with clinical observations, patient history, and epidemiological information. The expected result is Negative.  Fact Sheet for Patients: HairSlick.no  Fact Sheet for Healthcare Providers: quierodirigir.com  This test is not yet approved or cleared by the [place] FDA and  has been authorized for detection and/or diagnosis of SARS-CoV-2 by FDA  under an Emergency Use Authorization (EUA). This EUA will remain  in effect (meaning this test can be used) for the duration of the COVID-19 declaration under Se ction 564(b)(1) of the Act, 21 U.S.C. section 360bbb-3(b)(1), unless the authorization is terminated or revoked sooner.  Performed at [facility], [address] [place], [place] [address]   Gastrointestinal Panel by PCR , Stool     Status: None   Collection Time: 08/06/20  4:59 PM   Specimen: Stool  Result Value Ref Range Status   Campylobacter species NOT  DETECTED NOT DETECTED Final   Plesimonas shigelloides NOT DETECTED NOT DETECTED Final   Salmonella species NOT DETECTED NOT DETECTED Final   Yersinia enterocolitica NOT DETECTED NOT DETECTED Final   Vibrio species NOT DETECTED NOT DETECTED Final   Vibrio cholerae NOT DETECTED NOT DETECTED Final   Enteroaggregative E coli (EAEC) NOT DETECTED NOT DETECTED Final   Enteropathogenic E coli (EPEC) NOT DETECTED NOT DETECTED Final   Enterotoxigenic E coli (ETEC) NOT DETECTED NOT DETECTED Final   Shiga like toxin producing E coli (STEC) NOT DETECTED NOT DETECTED Final   Shigella/Enteroinvasive E coli (EIEC) NOT DETECTED NOT DETECTED Final   Cryptosporidium NOT DETECTED NOT DETECTED Final   Cyclospora cayetanensis NOT DETECTED NOT DETECTED Final   Entamoeba histolytica NOT DETECTED NOT DETECTED Final   Giardia lamblia NOT DETECTED NOT DETECTED Final   Adenovirus F40/41 NOT DETECTED NOT DETECTED Final   Astrovirus NOT DETECTED NOT DETECTED Final   Norovirus GI/GII NOT DETECTED NOT DETECTED Final   Rotavirus A NOT DETECTED NOT DETECTED Final   Sapovirus (I, II, IV, and V) NOT DETECTED NOT DETECTED Final    Comment: Performed at [facility], [address] [place], [place] [address]  Culture, fungus without smear     Status: None (Preliminary result)   Collection Time: 08/07/20  1:28 PM   Specimen: CSF; Cerebrospinal Fluid  Result Value Ref Range Status    Specimen Description CSF  Final   Special Requests Immunocompromised  Final   Culture   Final    NO FUNGUS ISOLATED AFTER 3 DAYS Performed at [facility], [address] [place], [place] [address]    Report Status PENDING  Incomplete  CSF culture     Status: None (Preliminary result)   Collection Time: 08/07/20  1:28 PM   Specimen: CSF; Cerebrospinal Fluid  Result Value Ref Range Status   Specimen Description CSF  Final   Special Requests Immunocompromised  Final   Gram Stain NO WBC SEEN NO ORGANISMS SEEN   Final   Culture   Final    NO GROWTH 3 DAYS Performed at [facility], [address] [place], [place] [address]    Report Status PENDING  Incomplete  MRSA PCR Screening     Status: None   Collection Time: 08/08/20  5:34 AM   Specimen: Nasopharyngeal  Result Value Ref Range Status   MRSA by PCR NEGATIVE NEGATIVE Final    Comment:        The GeneXpert MRSA Assay (FDA approved for NASAL specimens only), is one component of a comprehensive MRSA colonization surveillance program. It is not intended to diagnose MRSA infection nor to guide or monitor treatment for MRSA infections. Performed at [facility], [address], [place], [place] [address]      Radiology Studies: No results found. MR BRAIN W WO CONTRAST  Final Result    MR CERVICAL SPINE W WO CONTRAST  Final Result    MR Lumbar Spine W Wo Contrast  Final Result    MR THORACIC SPINE W WO CONTRAST  Final Result    DG Abd 1 View  Final Result    CT ANGIO CHEST PE W OR WO CONTRAST  Final Result    DG Chest Port 1 View  Final Result      Scheduled Meds: . amLODipine  10 mg Oral Daily  . bictegravir-emtricitabine-tenofovir AF  1 tablet Oral Daily  . carvedilol  6.25 mg Oral BID WC  . [  START ON 08/14/2020] folic acid  1 mg Oral Daily  . gabapentin  300 mg Oral TID  . Gerhardt's butt cream   Topical TID  . multivitamin with minerals  1 tablet Oral Daily  . sertraline  100 mg  Oral Daily  . sulfamethoxazole-trimethoprim  1 tablet Oral Daily  . [START ON 08/12/2020] thiamine  100 mg Oral Daily   PRN Meds: lip balm, loperamide, LORazepam **OR** LORazepam, ondansetron **OR** ondansetron (ZOFRAN) IV Continuous Infusions: . folic acid (FOLVITE) IVPB 5 mg (08/11/20 1025)  . ganciclovir 415 mg (08/11/20 0905)  . Immune Globulin 10% 35 g (08/10/20 2236)  . thiamine injection 500 mg (08/11/20 0818)     LOS: 8 days  Time spent: Greater than 50% of the 35 minute visit was spent in counseling/coordination of care for the patient as laid out in the A&P.   Lewie Chamber, MD [facility] 08/11/2020, 3:01 PM

## 2020-08-11 NOTE — Plan of Care (Signed)

## 2020-08-11 NOTE — Progress Notes (Signed)
Physical Therapy Treatment Patient Details Name: Harold Flores MRN: [id number] DOB: 1990/10/27 Today's Date: 08/11/2020    History of Present Illness 30 year old gentleman prior history of HIV, noncompliant to medications, essential hypertension, alcohol abuse, generalized anxiety disorder, GERD recently had lower extremity edema of unclear etiology and was started on  Lasix, Admitted 08/03/20 with progressive weakness , significantly low potassium of 2.    PT Comments    Pt supine in bed on arrival this session.  Pt continues to benefit from skilled rehab in aggressive setting.  Pt able to progress to gt training for first time in 2 weeks.  B buckling noted this session.  Close chair follow for safety with seated rest break between trials.    Follow Up Recommendations  CIR     Equipment Recommendations  Wheelchair (measurements PT);Wheelchair cushion (measurements PT);Rolling walker with 5" wheels    Recommendations for Other Services       Precautions / Restrictions Precautions Precautions: Fall Precaution Comments: B buckling noted with gt training. Restrictions Weight Bearing Restrictions: No    Mobility  Bed Mobility Overal bed mobility: Needs Assistance Bed Mobility: Rolling;Sidelying to Sit Rolling: Supervision Sidelying to sit: Supervision       General bed mobility comments: Pt able to move LEs to edge of bed with commands and elevate trunk into sitting.    Transfers Overall transfer level: Needs assistance Equipment used: Rolling walker (2 wheeled) Transfers: Sit to/from Stand Sit to Stand: Mod assist         General transfer comment: Cues for hand placement to rise into standing.  Heavy mod assistance to boost into standing with use of RW vs. stedy (in previous sessions).  Ambulation/Gait Ambulation/Gait assistance: Mod assist;Max assist Gait Distance (Feet): 8 Feet (+ 14 ft + 30 ft + 50 ft.) Assistive device: Rolling walker (2 wheeled) Gait  Pattern/deviations: Step-through pattern;Step-to pattern;Ataxic;Narrow base of support;Scissoring;Decreased stride length;Decreased dorsiflexion - right;Decreased dorsiflexion - left     General Gait Details: Pt presents with B foot slap and B intermittent buckling in stance phase. Close chair follow for safety.  LOB noted x 3.  Pt required cues to increase BOS.   Stairs             Wheelchair Mobility    Modified Rankin (Stroke Patients Only)       Balance Overall balance assessment: Needs assistance Sitting-balance support: Feet supported;Bilateral upper extremity supported Sitting balance-Leahy Scale: Good       Standing balance-Leahy Scale: Poor Standing balance comment: Heavy reliance on BUEs and external assistance.                            Cognition Arousal/Alertness: Awake/alert Behavior During Therapy: WFL for tasks assessed/performed;Anxious Overall Cognitive Status: Within Functional Limits for tasks assessed                                        Exercises Other Exercises Other Exercises: Instructed in B heel cord stretch with gt belt.    General Comments        Pertinent Vitals/Pain Pain Assessment: No/denies pain Faces Pain Scale: Hurts a little bit Pain Location: B hands and feet Pain Descriptors / Indicators: Tingling;Burning Pain Intervention(s): Monitored during session;Repositioned    Home Living  Prior Function            PT Goals (current goals can now be found in the care plan section) Acute Rehab PT Goals Patient Stated Goal: to get better Potential to Achieve Goals: Fair Additional Goals Additional Goal #1: propel Wc x 50' with supervision Progress towards PT goals: Progressing toward goals    Frequency    Min 3X/week      PT Plan Current plan remains appropriate    Co-evaluation              AM-PAC PT "6 Clicks" Mobility   Outcome Measure  Help  needed turning from your back to your side while in a flat bed without using bedrails?: A Little Help needed moving from lying on your back to sitting on the side of a flat bed without using bedrails?: A Little Help needed moving to and from a bed to a chair (including a wheelchair)?: A Little Help needed standing up from a chair using your arms (e.g., wheelchair or bedside chair)?: A Little Help needed to walk in hospital room?: A Lot Help needed climbing 3-5 steps with a railing? : A Lot 6 Click Score: 16    End of Session Equipment Utilized During Treatment: Gait belt Activity Tolerance: Patient tolerated treatment well Patient left: in chair;with call bell/phone within reach;with chair alarm set Nurse Communication: Mobility status;Need for lift equipment PT Visit Diagnosis: Muscle weakness (generalized) (M62.81);Other symptoms and signs involving the nervous system (R29.898);Repeated falls (R29.6)     Time: 9417-4081 PT Time Calculation (min) (ACUTE ONLY): 27 min  Charges:  $Gait Training: 8-22 mins $Therapeutic Activity: 8-22 mins                     Bonney Leitz , PTA Acute Rehabilitation Services Pager [phone] Office [phone]     Romey Cohea Artis Delay 08/11/2020, 4:18 PM

## 2020-08-11 NOTE — Progress Notes (Signed)
[facility]  Date of Admission:  08/03/2020      Total days of antibiotics   Day 3 ganciclovir   Day 3 IVIG           ASSESSMENT: Harold Flores is a 30 y.o. male with advanced HIV, AIDS status + (VL 104,000, CD4 40) off antiretroviral treatment for several years now here for evaluation of weakness and falls in the lower legs and weight loss.  MRI of the head and entire spine is unremarkable for any process. LP unremarkable cell counts (WBC 1, normal Protein/Glucose).   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  I think he is indicating that his neuropathy is better with the gabapentin. LE neuro exam seems improved compared to yesterday. He was able to raise his hips up with moderate effort to allow me to help change his linens today.  It also appears he is able to stand with nursing/PT but still needs a lot of support/assistance. CIR evaluating.  Follow up CSF PCR for CMV - if positive will need PICC line and 2 weeks of BID induction therapy followed by daily maintenance infusion until CD4 improves. Hold for now until PCR results.   Diarrhea - present prior to admission, a little worse on Biktarvy (known lead in side effect). OK to use imodium PRN.    Pertinent Tests : Folate 3.4 B12 726 Thiamine  CSF WBC 1, RBC 1, Protein 22, Glucose 50 CSF vdrl - negative HSV PCR, CSF - negative for 1 & 2 Varicella, CSF - negative  JC virus, CSF - negative  Cryptococcal Ag, CSF and Serum - negative  MTB PCR, CSF - pending EBV PCR, CSF - negative  CMV PCR, CSF - pending CMV PCR, serum - positive but low level virus < 200 copies  Toxo PCR, CSF - pending     PLAN: 1. Continue IV ganciclovir pending CMV PCR from CSF 2. Follow CBC 2x weekly for now on #1 3. Follow BMP at least weekly  4. Follow pending micro studies 5. Continue  bactrim for prophylaxis 6. Continue Biktarvy daily   Dr. Luciana Axe will check in with the patient over the weekend.    Principal Problem:   Acute metabolic encephalopathy Active Problems:   GAD (generalized anxiety disorder)   Essential hypertension, benign   Gastroesophageal reflux disease without esophagitis   AIDS (acquired immune deficiency syndrome) (HCC)   Hypokalemia   Lower extremity weakness   Alcohol abuse   Normocytic anemia   Diarrhea   Hypomagnesemia   Hyponatremia   Folic acid deficiency   . amLODipine  10 mg Oral Daily  . bictegravir-emtricitabine-tenofovir AF  1 tablet Oral Daily  . carvedilol  6.25 mg Oral BID WC  . [START ON 08/14/2020] folic acid  1 mg Oral Daily  . gabapentin  300 mg Oral TID  . Gerhardt's butt cream   Topical TID  . multivitamin with minerals  1 tablet Oral Daily  . sertraline  100 mg Oral Daily  . sulfamethoxazole-trimethoprim  1 tablet Oral Daily    SUBJECTIVE: Feels better today. Described "body burning" with one of the "powder medications" (?gabapentin) but he got some medication that helped that. Thinks his legs are a little more responsive today.   He was attempting to clean himself up from a BM while I came in  Review of  Systems: Review of Systems  Constitutional: Negative for chills and fever.  Respiratory: Negative for cough.   Cardiovascular: Negative for chest pain.  Genitourinary: Negative for dysuria.  Musculoskeletal: Negative for back pain and neck pain.  Neurological: Positive for tingling, focal weakness and weakness. Negative for dizziness, seizures and headaches.  Psychiatric/Behavioral: The patient is not nervous/anxious.     No Known Allergies  OBJECTIVE: Vitals:   08/11/20 0000 08/11/20 0017 08/11/20 0333 08/11/20 0827  BP: 138/81 (!) 143/79 (!) 143/95 (!) 150/95  Pulse: (!) 110 (!) 108 (!) 107 (!) 104  Resp: 18 19 18 17   Temp: 98.9 F (37.2 C) 99.2 F (37.3 C) 99.6 F (37.6 C) 98.2 F (36.8 C)   TempSrc: Oral Oral Oral Oral  SpO2: 100% 100% 100% 100%  Weight:      Height:       Body mass index is 26.19 kg/m.  Physical Exam Constitutional:      Comments: Resting comfortably in bed.   HENT:     Mouth/Throat:     Mouth: Mucous membranes are moist.  Eyes:     General: No scleral icterus.    Extraocular Movements: Extraocular movements intact.     Pupils: Pupils are equal, round, and reactive to light.     Comments: L eye appears to deviate a bit towards lateral canthus (normal per his report)   Cardiovascular:     Rate and Rhythm: Normal rate.     Pulses: Normal pulses.     Heart sounds: No murmur heard.   Pulmonary:     Effort: Pulmonary effort is normal.     Breath sounds: Normal breath sounds.  Abdominal:     General: Bowel sounds are normal.     Palpations: Abdomen is soft.  Musculoskeletal:        General: No swelling or tenderness.  Skin:    General: Skin is warm and dry.     Capillary Refill: Capillary refill takes less than 2 seconds.     Findings: No rash.  Neurological:     Mental Status: He is alert.     Sensory: No sensory deficit.     Motor: Weakness present.     Coordination: Coordination abnormal.     Comments: Able to raise feet off the bed to gravity (R 3/5, L 4/5). Making more effort to dorsiflex feet today, still requiring passive assistance to perform skill. Movements seem a bit more coordinated today.   Psychiatric:        Mood and Affect: Mood normal.        Behavior: Behavior normal.     Lab Results Lab Results  Component Value Date   WBC 8.3 08/11/2020   HGB 8.5 (L) 08/11/2020   HCT 24.8 (L) 08/11/2020   MCV 91.9 08/11/2020   PLT 265 08/11/2020    Lab Results  Component Value Date   CREATININE 1.00 08/11/2020   BUN 8 08/11/2020   NA 129 (L) 08/11/2020   K 3.9 08/11/2020   CL 96 (L) 08/11/2020   CO2 24 08/11/2020    Lab Results  Component Value Date   ALT 29 08/11/2020   AST 47 (H) 08/11/2020   ALKPHOS 75 08/11/2020    BILITOT 0.9 08/11/2020     Microbiology: Recent Results (from the past 240 hour(s))  SARS CORONAVIRUS 2 (TAT 6-24 HRS) Nasopharyngeal Nasopharyngeal Swab     Status: None   Collection Time: 08/03/20 10:00 PM   Specimen: Nasopharyngeal Swab  Result Value  Ref Range Status   SARS Coronavirus 2 NEGATIVE NEGATIVE Final    Comment: (NOTE) SARS-CoV-2 target nucleic acids are NOT DETECTED.  The SARS-CoV-2 RNA is generally detectable in upper and lower respiratory specimens during the acute phase of infection. Negative results do not preclude SARS-CoV-2 infection, do not rule out co-infections with other pathogens, and should not be used as the sole basis for treatment or other patient management decisions. Negative results must be combined with clinical observations, patient history, and epidemiological information. The expected result is Negative.  Fact Sheet for Patients: HairSlick.no  Fact Sheet for Healthcare Providers: quierodirigir.com  This test is not yet approved or cleared by the [place] FDA and  has been authorized for detection and/or diagnosis of SARS-CoV-2 by FDA under an Emergency Use Authorization (EUA). This EUA will remain  in effect (meaning this test can be used) for the duration of the COVID-19 declaration under Se ction 564(b)(1) of the Act, 21 U.S.C. section 360bbb-3(b)(1), unless the authorization is terminated or revoked sooner.  Performed at [facility], [address], [place], [place] [address]   Gastrointestinal Panel by PCR , Stool     Status: None   Collection Time: 08/06/20  4:59 PM   Specimen: Stool  Result Value Ref Range Status   Campylobacter species NOT DETECTED NOT DETECTED Final   Plesimonas shigelloides NOT DETECTED NOT DETECTED Final   Salmonella species NOT DETECTED NOT DETECTED Final   Yersinia enterocolitica NOT DETECTED NOT DETECTED Final   Vibrio species NOT  DETECTED NOT DETECTED Final   Vibrio cholerae NOT DETECTED NOT DETECTED Final   Enteroaggregative E coli (EAEC) NOT DETECTED NOT DETECTED Final   Enteropathogenic E coli (EPEC) NOT DETECTED NOT DETECTED Final   Enterotoxigenic E coli (ETEC) NOT DETECTED NOT DETECTED Final   Shiga like toxin producing E coli (STEC) NOT DETECTED NOT DETECTED Final   Shigella/Enteroinvasive E coli (EIEC) NOT DETECTED NOT DETECTED Final   Cryptosporidium NOT DETECTED NOT DETECTED Final   Cyclospora cayetanensis NOT DETECTED NOT DETECTED Final   Entamoeba histolytica NOT DETECTED NOT DETECTED Final   Giardia lamblia NOT DETECTED NOT DETECTED Final   Adenovirus F40/41 NOT DETECTED NOT DETECTED Final   Astrovirus NOT DETECTED NOT DETECTED Final   Norovirus GI/GII NOT DETECTED NOT DETECTED Final   Rotavirus A NOT DETECTED NOT DETECTED Final   Sapovirus (I, II, IV, and V) NOT DETECTED NOT DETECTED Final    Comment: Performed at [facility], [address] [place], [place] [address]  Culture, fungus without smear     Status: None (Preliminary result)   Collection Time: 08/07/20  1:28 PM   Specimen: CSF; Cerebrospinal Fluid  Result Value Ref Range Status   Specimen Description CSF  Final   Special Requests Immunocompromised  Final   Culture   Final    NO FUNGUS ISOLATED AFTER 2 DAYS Performed at [facility], [address], [place], [place] [address]    Report Status PENDING  Incomplete  CSF culture     Status: None (Preliminary result)   Collection Time: 08/07/20  1:28 PM   Specimen: CSF; Cerebrospinal Fluid  Result Value Ref Range Status   Specimen Description CSF  Final   Special Requests Immunocompromised  Final   Gram Stain NO WBC SEEN NO ORGANISMS SEEN   Final   Culture   Final    NO GROWTH 3 DAYS Performed at [facility], [address] [place], [place]  [address]    Report Status PENDING  Incomplete  MRSA PCR Screening     Status: None   Collection Time: 08/08/20   5:34 AM   Specimen: Nasopharyngeal  Result Value Ref Range Status   MRSA by PCR NEGATIVE NEGATIVE Final    Comment:        The GeneXpert MRSA Assay (FDA approved for NASAL specimens only), is one component of a comprehensive MRSA colonization surveillance program. It is not intended to diagnose MRSA infection nor to guide or monitor treatment for MRSA infections. Performed at [facility], [address], [place], [place] [address]      Rexene Alberts, MSN, NP-C [facility]  [email or web address] Pager: [phone] Office: [phone] [facility] Main Line: [phone]

## 2020-08-12 LAB — CBC WITH DIFFERENTIAL/PLATELET
Abs Immature Granulocytes: 0.08 10*3/uL — ABNORMAL HIGH (ref 0.00–0.07)
Basophils Absolute: 0 10*3/uL (ref 0.0–0.1)
Basophils Relative: 0 %
Eosinophils Absolute: 0.4 10*3/uL (ref 0.0–0.5)
Eosinophils Relative: 5 %
HCT: 24.4 % — ABNORMAL LOW (ref 39.0–52.0)
Hemoglobin: 8.6 g/dL — ABNORMAL LOW (ref 13.0–17.0)
Immature Granulocytes: 1 %
Lymphocytes Relative: 21 %
Lymphs Abs: 1.8 10*3/uL (ref 0.7–4.0)
MCH: 32.3 pg (ref 26.0–34.0)
MCHC: 35.2 g/dL (ref 30.0–36.0)
MCV: 91.7 fL (ref 80.0–100.0)
Monocytes Absolute: 1.1 10*3/uL — ABNORMAL HIGH (ref 0.1–1.0)
Monocytes Relative: 13 %
Neutro Abs: 5.2 10*3/uL (ref 1.7–7.7)
Neutrophils Relative %: 60 %
Platelets: 250 10*3/uL (ref 150–400)
RBC: 2.66 MIL/uL — ABNORMAL LOW (ref 4.22–5.81)
RDW: 18 % — ABNORMAL HIGH (ref 11.5–15.5)
WBC: 8.6 10*3/uL (ref 4.0–10.5)
nRBC: 0 % (ref 0.0–0.2)

## 2020-08-12 LAB — CSF CULTURE W GRAM STAIN
Culture: NO GROWTH
Gram Stain: NONE SEEN

## 2020-08-12 LAB — MAGNESIUM: Magnesium: 1.7 mg/dL (ref 1.7–2.4)

## 2020-08-12 LAB — COMPREHENSIVE METABOLIC PANEL
ALT: 24 U/L (ref 0–44)
AST: 34 U/L (ref 15–41)
Albumin: 2.4 g/dL — ABNORMAL LOW (ref 3.5–5.0)
Alkaline Phosphatase: 75 U/L (ref 38–126)
Anion gap: 10 (ref 5–15)
BUN: 6 mg/dL (ref 6–20)
CO2: 22 mmol/L (ref 22–32)
Calcium: 8.4 mg/dL — ABNORMAL LOW (ref 8.9–10.3)
Chloride: 98 mmol/L (ref 98–111)
Creatinine, Ser: 0.88 mg/dL (ref 0.61–1.24)
GFR, Estimated: >60 mL/min (ref >60)
Glucose, Bld: 76 mg/dL (ref 70–99)
Potassium: 4 mmol/L (ref 3.5–5.1)
Sodium: 130 mmol/L — ABNORMAL LOW (ref 135–145)
Total Bilirubin: 1.1 mg/dL (ref 0.3–1.2)
Total Protein: 7.3 g/dL (ref 6.5–8.1)

## 2020-08-12 LAB — TOXOPLASMA GONDII, PCR: Toxoplasma Gondii, PCR: NEGATIVE

## 2020-08-12 MED ORDER — LORAZEPAM 2 MG/ML IJ SOLN
INTRAMUSCULAR | Status: AC
  Filled 2020-08-12: qty 1

## 2020-08-12 MED ORDER — LORAZEPAM 0.5 MG PO TABS
0.2500 mg | ORAL_TABLET | Freq: Two times a day (BID) | ORAL | Status: DC | PRN
  Administered 2020-08-13 (×2): 0.25 mg via ORAL
  Filled 2020-08-12 (×2): qty 1

## 2020-08-12 MED ORDER — LORAZEPAM 2 MG/ML IJ SOLN
1.0000 mg | Freq: Once | INTRAMUSCULAR | Status: AC
  Administered 2020-08-12: 1 mg via INTRAVENOUS

## 2020-08-12 MED ORDER — LORAZEPAM 0.5 MG PO TABS: 0.5000 mg | ORAL_TABLET | Freq: Two times a day (BID) | ORAL | Status: DC | PRN

## 2020-08-12 NOTE — Progress Notes (Signed)
PROGRESS NOTE    Harold Flores   UJW:[id number]  DOB: 1990/09/18  DOA: 08/03/2020     9  PCP: Etta Grandchild, MD  CC: weakness, AMS  Hospital Course: Harold Flores is a 30 year old male with PMH HIV, noncompliant to medications, essential hypertension, alcohol abuse, generalized anxiety disorder, GERD recently had lower extremity edema of unclear etiology and was started on Lasix following which patient reports not feeling well with generalized weakness.   The swelling improved after Lasix but on arrival to ED he was found to have significantly low potassium.  He was admitted to [facility] for further evaluation and management of severe hypokalemia and lower extremity edema.     On further discussions with the patient's husband, his symptoms have been ongoing for approximately 1 month. He has been non compliant to his HIV meds.  He had then developed multiple loose stools with intermittent confusion and hallucinations. Further collateral information was provided from his mother who also reported that he under reports his amount of drinking.  She states that he drinks an excessive amount of alcohol daily at home.    ID was also consulted given his confusion and concern for possible underlying infectious etiology.  He was recommended for undergoing full back MRI as well as brain MRI and LP.  Imaging studies were negative for any pathology including brain MRI.  LP was also obtained on 08/08/2020 which had normal opening pressure and negative for signs of infection.  Further studies still in process.  He continued to exhibit intermittent episodes of confusion during hospitalization. Studies were reviewed by neurology on 08/09/2020.  Decision was made to start patient on IVIG as well as ganciclovir in conjunction with discussion with ID.  He was also placed on high-dose folic acid and thiamine supplementations.  Interval History:  No events overnight.  He is awake and alert talking easily with me  this morning.  It appears that his mentation has been slowly improving the past couple days and I can also tell a difference in his mood as well as tremors in his hands which have both improved.  He was able to ambulate with physical therapy yesterday; still had some loss of balance and required cues to increase his stability.  ROS: Constitutional: negative for chills and fevers, Respiratory: negative for cough, Cardiovascular: negative for chest pain and Gastrointestinal: negative for abdominal pain  Assessment & Plan: * Acute metabolic encephalopathy - patient symptoms include delirium, hallucinations - etiology considered due to metabolic possibly from infectious source however still may be due to etoh use as well - follow up B1 level, still in process - MRIs are negative as well as prelim results from LP; still other tests pending - continue treating with IVIG (started 3/2), ganciclovir (started 3/2), and high-dose thiamine/folic acid and monitor mentation response (seems to have been slowly improving the past couple days)  Alcohol abuse - evening of 3/1 appeared to have further withdrawal symptoms and was given Ativan under CIWA protocol. -Continue CIWA for now; scores now downtrending; mild agitation/tremor reported per patient this am, 3/4 -Continue folate, thiamine, multivitamin  Lower extremity weakness - PT/OT recommending CIR - etiology still u/k; possibly severe deconditioning from his excessive alcohol use versus infectious etiology from underlying uncontrolled HIV (followed by neurology and ID) - patient started on IVIG (cannot rule out GBS at this time) per neurology; 5 day course -Continue with PT while hospitalized as patient is able - MRI C/TL spine unremarkable to explain weakness; MRI  brain also unremarkable  - continue folic acid  Folic acid deficiency - level 3.4 - has been on oral folate but also having diarrhea on admission - given some concern for possible  malabsorption as well as concern for myelopathy/persistent LE weakness will initiate on high dose course of IV folic acid followed by oral again (discussed with neurology as well)  HIV (human immunodeficiency virus infection) (HCC) - Non compliant to meds. CD4 absolute count is 40, HIV RNA 104,000  - ID following -Biktarvy being started per ID -Bactrim also started for PJP ppx  Diarrhea -Negative GI pathogen panel -As he has continued to have withdrawal symptoms, diarrhea may be related to ongoing alcohol withdrawal vs ART vs (could also be seen in CMV)  Normocytic anemia - downtrend from 2021 (prior 13-15 g/dL), now downtrending further -Iron stores adequate. -Folate low, 3.4 -Vitamin B12 726  Hyponatremia -Consider due to diuresis prior to admission -Has been improving  Hypomagnesemia -Likely from GI losses with diarrhea -Replete and recheck as needed  Hypokalemia - repleting as needed  Essential hypertension, benign -Continue amlodipine and Coreg  GAD (generalized anxiety disorder) -Continue Zoloft  Elevated troponin-resolved as of 08/09/2020 Pt denies any chest pain or sob.  ? From severe hypokalemia , anemia and demand ischemia.  ekg shows sinus tachycardia with t wave inversions in the lateral leads. Echocardiogram no significant abnormalities.  CT angio of the chest negative for PE.  Cardiology consulted for elevated troponins, recommended no further work-up.  Old records reviewed in assessment of this patient  Antimicrobials: Biktarvy 08/09/2020 >> current IVIG 08/09/2020 >> current  Ganciclovir 08/09/2020 >> current Bactrim 08/09/2020 >> current   DVT prophylaxis: SCDs Start: 08/03/20 2229   Code Status:   Code Status: Full Code Family Communication: mother   Disposition Plan: Status is: Inpatient  Remains inpatient appropriate because:Ongoing diagnostic testing needed not appropriate for outpatient work up, IV treatments appropriate due to intensity of  illness or inability to take PO and Inpatient level of care appropriate due to severity of illness   Dispo: The patient is from: Home              Anticipated d/c is to: possibly CIR              Patient currently is not medically stable to d/c.   Difficult to place patient No Risk of unplanned readmission score: Unplanned Admission- Pilot do not use: 12   Objective: Blood pressure (!) 144/98, pulse (!) 109, temperature 99 F (37.2 C), temperature source Oral, resp. rate 17, height 5\' 10"  (1.778 m), weight 82.8 kg, SpO2 99 %.  Examination: General appearance: more awake/alert; sitting up in bed dressed now and in NAD Head: Normocephalic, without obvious abnormality, atraumatic Eyes: EOMI Lungs: clear to auscultation bilaterally Heart: regular rate and rhythm and S1, S2 normal Abdomen: normal findings: bowel sounds normal and soft, non-tender Extremities: no edema Skin: mobility and turgor normal Neurologic: slowed mentation; 4/5 RUE strength. B/L LE strength 4/5 (improved form 2/5, still question his effort at times)  Consultants:   ID  IR  Neurology   Procedures:   LP, 3/1  Data Reviewed: I have personally reviewed following labs and imaging studies Results for orders placed or performed during the hospital encounter of 08/03/20 (from the past 24 hour(s))  CBC with Differential/Platelet     Status: Abnormal   Collection Time: 08/12/20  2:45 AM  Result Value Ref Range   WBC 8.6 4.0 - 10.5 K/uL  RBC 2.66 (L) 4.22 - 5.81 MIL/uL   Hemoglobin 8.6 (L) 13.0 - 17.0 g/dL   HCT 32.2 (L) 02.5 - 42.7 %   MCV 91.7 80.0 - 100.0 fL   MCH 32.3 26.0 - 34.0 pg   MCHC 35.2 30.0 - 36.0 g/dL   RDW 06.2 (H) 37.6 - 28.3 %   Platelets 250 150 - 400 K/uL   nRBC 0.0 0.0 - 0.2 %   Neutrophils Relative % 60 %   Neutro Abs 5.2 1.7 - 7.7 K/uL   Lymphocytes Relative 21 %   Lymphs Abs 1.8 0.7 - 4.0 K/uL   Monocytes Relative 13 %   Monocytes Absolute 1.1 (H) 0.1 - 1.0 K/uL   Eosinophils  Relative 5 %   Eosinophils Absolute 0.4 0.0 - 0.5 K/uL   Basophils Relative 0 %   Basophils Absolute 0.0 0.0 - 0.1 K/uL   Immature Granulocytes 1 %   Abs Immature Granulocytes 0.08 (H) 0.00 - 0.07 K/uL  Magnesium     Status: None   Collection Time: 08/12/20  2:45 AM  Result Value Ref Range   Magnesium 1.7 1.7 - 2.4 mg/dL  Comprehensive metabolic panel     Status: Abnormal   Collection Time: 08/12/20  2:45 AM  Result Value Ref Range   Sodium 130 (L) 135 - 145 mmol/L   Potassium 4.0 3.5 - 5.1 mmol/L   Chloride 98 98 - 111 mmol/L   CO2 22 22 - 32 mmol/L   Glucose, Bld 76 70 - 99 mg/dL   BUN 6 6 - 20 mg/dL   Creatinine, Ser 1.51 0.61 - 1.24 mg/dL   Calcium 8.4 (L) 8.9 - 10.3 mg/dL   Total Protein 7.3 6.5 - 8.1 g/dL   Albumin 2.4 (L) 3.5 - 5.0 g/dL   AST 34 15 - 41 U/L   ALT 24 0 - 44 U/L   Alkaline Phosphatase 75 38 - 126 U/L   Total Bilirubin 1.1 0.3 - 1.2 mg/dL   GFR, Estimated >76 >16 mL/min   Anion gap 10 5 - 15    Recent Results (from the past 240 hour(s))  SARS CORONAVIRUS 2 (TAT 6-24 HRS) Nasopharyngeal Nasopharyngeal Swab     Status: None   Collection Time: 08/03/20 10:00 PM   Specimen: Nasopharyngeal Swab  Result Value Ref Range Status   SARS Coronavirus 2 NEGATIVE NEGATIVE Final    Comment: (NOTE) SARS-CoV-2 target nucleic acids are NOT DETECTED.  The SARS-CoV-2 RNA is generally detectable in upper and lower respiratory specimens during the acute phase of infection. Negative results do not preclude SARS-CoV-2 infection, do not rule out co-infections with other pathogens, and should not be used as the sole basis for treatment or other patient management decisions. Negative results must be combined with clinical observations, patient history, and epidemiological information. The expected result is Negative.  Fact Sheet for Patients: HairSlick.no  Fact Sheet for Healthcare Providers: quierodirigir.com  This  test is not yet approved or cleared by the [place] FDA and  has been authorized for detection and/or diagnosis of SARS-CoV-2 by FDA under an Emergency Use Authorization (EUA). This EUA will remain  in effect (meaning this test can be used) for the duration of the COVID-19 declaration under Se ction 564(b)(1) of the Act, 21 U.S.C. section 360bbb-3(b)(1), unless the authorization is terminated or revoked sooner.  Performed at [facility], [address] [place], [place] [address]   Gastrointestinal Panel by PCR , Stool  Status: None   Collection Time: 08/06/20  4:59 PM   Specimen: Stool  Result Value Ref Range Status   Campylobacter species NOT DETECTED NOT DETECTED Final   Plesimonas shigelloides NOT DETECTED NOT DETECTED Final   Salmonella species NOT DETECTED NOT DETECTED Final   Yersinia enterocolitica NOT DETECTED NOT DETECTED Final   Vibrio species NOT DETECTED NOT DETECTED Final   Vibrio cholerae NOT DETECTED NOT DETECTED Final   Enteroaggregative E coli (EAEC) NOT DETECTED NOT DETECTED Final   Enteropathogenic E coli (EPEC) NOT DETECTED NOT DETECTED Final   Enterotoxigenic E coli (ETEC) NOT DETECTED NOT DETECTED Final   Shiga like toxin producing E coli (STEC) NOT DETECTED NOT DETECTED Final   Shigella/Enteroinvasive E coli (EIEC) NOT DETECTED NOT DETECTED Final   Cryptosporidium NOT DETECTED NOT DETECTED Final   Cyclospora cayetanensis NOT DETECTED NOT DETECTED Final   Entamoeba histolytica NOT DETECTED NOT DETECTED Final   Giardia lamblia NOT DETECTED NOT DETECTED Final   Adenovirus F40/41 NOT DETECTED NOT DETECTED Final   Astrovirus NOT DETECTED NOT DETECTED Final   Norovirus GI/GII NOT DETECTED NOT DETECTED Final   Rotavirus A NOT DETECTED NOT DETECTED Final   Sapovirus (I, II, IV, and V) NOT DETECTED NOT DETECTED Final    Comment: Performed at [facility], [address] [place], [place] [address]  Culture, fungus without smear     Status:  None (Preliminary result)   Collection Time: 08/07/20  1:28 PM   Specimen: CSF; Cerebrospinal Fluid  Result Value Ref Range Status   Specimen Description CSF  Final   Special Requests Immunocompromised  Final   Culture   Final    NO FUNGUS ISOLATED AFTER 3 DAYS Performed at [facility], [address] [place], [place] [address]    Report Status PENDING  Incomplete  CSF culture     Status: None   Collection Time: 08/07/20  1:28 PM   Specimen: CSF; Cerebrospinal Fluid  Result Value Ref Range Status   Specimen Description CSF  Final   Special Requests Immunocompromised  Final   Gram Stain NO WBC SEEN NO ORGANISMS SEEN   Final   Culture   Final    NO GROWTH 3 DAYS Performed at [facility], [address] [place], [place] [address]    Report Status 08/12/2020 FINAL  Final  MRSA PCR Screening     Status: None   Collection Time: 08/08/20  5:34 AM   Specimen: Nasopharyngeal  Result Value Ref Range Status   MRSA by PCR NEGATIVE NEGATIVE Final    Comment:        The GeneXpert MRSA Assay (FDA approved for NASAL specimens only), is one component of a comprehensive MRSA colonization surveillance program. It is not intended to diagnose MRSA infection nor to guide or monitor treatment for MRSA infections. Performed at [facility], [address] [place], [place] [address]   Georgana Curio, PCR     Status: None   Collection Time: 08/09/20  8:28 AM   Specimen: Blood  Result Value Ref Range Status   Toxoplasma Gondii, PCR Negative Negative Final    Comment: (NOTE) No Toxoplasma gondii DNA detected. This test was developed and its performance characteristics determined by [employer]. It has not been cleared or approved by the U.S. Food and Drug Administration. The FDA has determined that such clearance or approval is not necessary. This test is used for clinical purposes. It should not be regarded  as investigational or research. Performed At:  [facility] [address] [place], [place] [id number] Jolene Schimke MD DJ:[id number]      Radiology Studies: No results found. MR BRAIN W WO CONTRAST  Final Result    MR CERVICAL SPINE W WO CONTRAST  Final Result    MR Lumbar Spine W Wo Contrast  Final Result    MR THORACIC SPINE W WO CONTRAST  Final Result    DG Abd 1 View  Final Result    CT ANGIO CHEST PE W OR WO CONTRAST  Final Result    DG Chest Port 1 View  Final Result      Scheduled Meds: . amLODipine  10 mg Oral Daily  . bictegravir-emtricitabine-tenofovir AF  1 tablet Oral Daily  . carvedilol  6.25 mg Oral BID WC  . [START ON 08/14/2020] folic acid  1 mg Oral Daily  . gabapentin  300 mg Oral TID  . Gerhardt's butt cream   Topical TID  . multivitamin with minerals  1 tablet Oral Daily  . sertraline  100 mg Oral Daily  . sulfamethoxazole-trimethoprim  1 tablet Oral Daily  . thiamine  100 mg Oral Daily   PRN Meds: lip balm, loperamide, LORazepam, ondansetron **OR** ondansetron (ZOFRAN) IV Continuous Infusions: . folic acid (FOLVITE) IVPB 5 mg (08/12/20 1254)  . ganciclovir 415 mg (08/12/20 1025)  . Immune Globulin 10% 35 g (08/11/20 2254)     LOS: 9 days  Time spent: Greater than 50% of the 35 minute visit was spent in counseling/coordination of care for the patient as laid out in the A&P.   Lewie Chamber, MD [facility] 08/12/2020, 2:23 PM

## 2020-08-12 NOTE — Plan of Care (Signed)
  Problem: Education: Goal: Knowledge of General Education information will improve Description: Including pain rating scale, medication(s)/side effects and non-pharmacologic comfort measures Outcome: Progressing   Problem: Activity: Goal: Risk for activity intolerance will decrease Outcome: Progressing   Problem: Nutrition: Goal: Adequate nutrition will be maintained Outcome: Progressing   

## 2020-08-12 NOTE — Progress Notes (Signed)
Patient stated that the IG infusion makes him anxious and that he wants 2mg  of Ativan. LPN

## 2020-08-12 NOTE — Progress Notes (Signed)
Subjective: Feels he is improving  Exam: Vitals:   08/12/20 0833 08/12/20 1522  BP: (!) 144/98 (!) 148/85  Pulse: (!) 109 (!) 105  Resp: 17 17  Temp: 99 F (37.2 C) 98.3 F (36.8 C)  SpO2: 99% 100%   Gen: In bed, NAD Resp: non-labored breathing, no acute distress Abd: soft, nt  Neuro: MS: awake, alert, interactiv eand appropriate XT:GGYIR, EOMI, no nystagmus Motor: 5/5 in UE, he has 4/5 hip flexion 4/5 knee extension/flexion, he has 2/5 plantar flexion bilaterally, 2/5 dorsiflexion on the right and 1/5 on the left Sensory: Decreased to light touch from the midfoot downwards SWN:IOEVOJ in BLE, trace at biceps He is ataxic in his upper extremities   Pertinent Labs: Folate 3.4 Thiamine pending CSF WBC 1 RBC 1 Protein 22 Glucose 50 CSF vdrl - negative CSF EBV-negative CSF VZV PCR -negative CSF HSV-negative CSF VDRL-negative CSF JC virus-negative CSF toxoplasma-negative  CSF VZV IgG-pending CSF CMV PCR-pending CSF Mycobacterium TB-pending   HIV RNA quant 104,000, CD4 40 Peripheral CMV +  B12 726  Imaging: MRI imaging of the complete neuraxis is negative with and without contrast, no evidence of nerve root enhancement   Impression: 30 year old male with acute neuropathic process by exam as well as some delirium in the setting of AIDS.  Possible etiologies include Guillain-Barr syndrome (though I would expect elevated protein at this point), CMV polyradiculopathy (though I would expect pleocytosis on LP), folate deficiency neuropathy (though this is typically more sensory), thiamine deficiency (which could explain both confusion and neuropathy).  Folate neuropathy has the unusual distinction of preserving biceps reflexes, but as stated above it is usually more of a sensory neuropathy.  Given that I do not think I can completely exclude Guillain-Barr syndrome, IVIG was started.  He does appear to be having some mild improvement and is subjectively having  improvement.  CMV polyradiculopathy is less likely given no pleocytosis, but with concern for the possible severity that the syndrome presents, I favored starting treatment as we await his CSF CMV.  I discussed this with infectious disease and they have started ganciclovir.   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  Recommendations: 1) agree with high-dose folate 2) resume thiamine 100 mg daily after he finishes his 500 mg 3 times daily 3) appreciate ID assistance, ganciclovir has been started 4) IVIG total 2 g/kg dose, day 4 today 5) neurology will follow  Ritta Slot, MD [facility] [phone]  If 7pm- 7am, please page neurology on call as listed in AMION.

## 2020-08-12 NOTE — Progress Notes (Signed)
[facility]   Reason for visit: Follow up on AMS  Interval History: no positive cultures, WBC wnl, no fever.  No acute events. CMV in CSF pending.   Day 4 ganciclovir Biktarvy Sulfamethoxazole   Physical Exam: Constitutional:  Vitals:   08/12/20 0410 08/12/20 0833  BP: 140/83 (!) 144/98  Pulse: (!) 110 (!) 109  Resp: 16 17  Temp: 98.9 F (37.2 C) 99 F (37.2 C)  SpO2: 100% 99%   patient appears in NAD; does not respond to my questions; sleeping Respiratory: Normal respiratory effort, breathing comfortably; CTA B Cardiovascular: RRR Skin: no rashes  Review of Systems: Unable to be assessed due to patient factors  Lab Results  Component Value Date   WBC 8.6 08/12/2020   HGB 8.6 (L) 08/12/2020   HCT 24.4 (L) 08/12/2020   MCV 91.7 08/12/2020   PLT 250 08/12/2020    Lab Results  Component Value Date   CREATININE 0.88 08/12/2020   BUN 6 08/12/2020   NA 130 (L) 08/12/2020   K 4.0 08/12/2020   CL 98 08/12/2020   CO2 22 08/12/2020    Lab Results  Component Value Date   ALT 24 08/12/2020   AST 34 08/12/2020   ALKPHOS 75 08/12/2020     Microbiology: Recent Results (from the past 240 hour(s))  SARS CORONAVIRUS 2 (TAT 6-24 HRS) Nasopharyngeal Nasopharyngeal Swab     Status: None   Collection Time: 08/03/20 10:00 PM   Specimen: Nasopharyngeal Swab  Result Value Ref Range Status   SARS Coronavirus 2 NEGATIVE NEGATIVE Final    Comment: (NOTE) SARS-CoV-2 target nucleic acids are NOT DETECTED.  The SARS-CoV-2 RNA is generally detectable in upper and lower respiratory specimens during the acute phase of infection. Negative results do not preclude SARS-CoV-2 infection, do not rule out co-infections with other pathogens, and should not be used as the sole basis for treatment or other patient management decisions. Negative results must be combined with clinical observations, patient history, and epidemiological information. The  expected result is Negative.  Fact Sheet for Patients: HairSlick.no  Fact Sheet for Healthcare Providers: quierodirigir.com  This test is not yet approved or cleared by the [place] FDA and  has been authorized for detection and/or diagnosis of SARS-CoV-2 by FDA under an Emergency Use Authorization (EUA). This EUA will remain  in effect (meaning this test can be used) for the duration of the COVID-19 declaration under Se ction 564(b)(1) of the Act, 21 U.S.C. section 360bbb-3(b)(1), unless the authorization is terminated or revoked sooner.  Performed at [facility], [address], [place], [place] [address]   Gastrointestinal Panel by PCR , Stool     Status: None   Collection Time: 08/06/20  4:59 PM   Specimen: Stool  Result Value Ref Range Status   Campylobacter species NOT DETECTED NOT DETECTED Final   Plesimonas shigelloides NOT DETECTED NOT DETECTED Final   Salmonella species NOT DETECTED NOT DETECTED Final   Yersinia enterocolitica NOT DETECTED NOT DETECTED Final   Vibrio species NOT DETECTED NOT DETECTED Final   Vibrio cholerae NOT DETECTED NOT DETECTED Final   Enteroaggregative E coli (EAEC) NOT DETECTED NOT DETECTED Final   Enteropathogenic E coli (EPEC) NOT DETECTED NOT DETECTED Final   Enterotoxigenic E coli (ETEC) NOT DETECTED NOT DETECTED Final   Shiga like toxin producing E coli (STEC) NOT DETECTED NOT DETECTED Final   Shigella/Enteroinvasive E coli (EIEC) NOT DETECTED NOT DETECTED Final   Cryptosporidium  NOT DETECTED NOT DETECTED Final   Cyclospora cayetanensis NOT DETECTED NOT DETECTED Final   Entamoeba histolytica NOT DETECTED NOT DETECTED Final   Giardia lamblia NOT DETECTED NOT DETECTED Final   Adenovirus F40/41 NOT DETECTED NOT DETECTED Final   Astrovirus NOT DETECTED NOT DETECTED Final   Norovirus GI/GII NOT DETECTED NOT DETECTED Final   Rotavirus A NOT DETECTED NOT DETECTED Final    Sapovirus (I, II, IV, and V) NOT DETECTED NOT DETECTED Final    Comment: Performed at [facility], [address] [place], [place] [address]  Culture, fungus without smear     Status: None (Preliminary result)   Collection Time: 08/07/20  1:28 PM   Specimen: CSF; Cerebrospinal Fluid  Result Value Ref Range Status   Specimen Description CSF  Final   Special Requests Immunocompromised  Final   Culture   Final    NO FUNGUS ISOLATED AFTER 3 DAYS Performed at [facility], [address], [place], [place] [address]    Report Status PENDING  Incomplete  CSF culture     Status: None   Collection Time: 08/07/20  1:28 PM   Specimen: CSF; Cerebrospinal Fluid  Result Value Ref Range Status   Specimen Description CSF  Final   Special Requests Immunocompromised  Final   Gram Stain NO WBC SEEN NO ORGANISMS SEEN   Final   Culture   Final    NO GROWTH 3 DAYS Performed at [facility], [address], [place], [place] [address]    Report Status 08/12/2020 FINAL  Final  MRSA PCR Screening     Status: None   Collection Time: 08/08/20  5:34 AM   Specimen: Nasopharyngeal  Result Value Ref Range Status   MRSA by PCR NEGATIVE NEGATIVE Final    Comment:        The GeneXpert MRSA Assay (FDA approved for NASAL specimens only), is one component of a comprehensive MRSA colonization surveillance program. It is not intended to diagnose MRSA infection nor to guide or monitor treatment for MRSA infections. Performed at [facility], [address], [place], [place] [address]   Georgana Curio, PCR     Status: None   Collection Time: 08/09/20  8:28 AM   Specimen: Blood  Result Value Ref Range Status   Toxoplasma Gondii, PCR Negative Negative Final    Comment: (NOTE) No Toxoplasma gondii DNA detected. This test was developed and its performance characteristics determined by [employer]. It has not been cleared or approved by the U.S. Food and Drug  Administration. The FDA has determined that such clearance or approval is not necessary. This test is used for clinical purposes. It should not be regarded as investigational or research. Performed At: [facility] [address] [place], [place] [id number] Jolene Schimke MD [id number]     Impression/Plan:  1. Encephalopathy - overall seems to be improving though patient no participating this am.  All studies either negative or pending.  CMV in the CSF not resulted yet.  He remains on ganciclovir and will continue pending the CSF result.  No other etiology identified.    2.  HIV/AIDS - on Biktarvy and appears to be tolerating well.  No changes.  3.  OI prophylaxis - he is on TMP/SMX and will continue.

## 2020-08-12 NOTE — Progress Notes (Signed)
[facility] inform of patient request to have Ativan reduced to !/2 of dose. IT make him fell loopy

## 2020-08-13 DIAGNOSIS — F1010 Alcohol abuse, uncomplicated: Secondary | ICD-10-CM

## 2020-08-13 LAB — CBC WITH DIFFERENTIAL/PLATELET
Abs Immature Granulocytes: 0.12 10*3/uL — ABNORMAL HIGH (ref 0.00–0.07)
Basophils Absolute: 0 10*3/uL (ref 0.0–0.1)
Basophils Relative: 0 %
Eosinophils Absolute: 0.4 10*3/uL (ref 0.0–0.5)
Eosinophils Relative: 4 %
HCT: 24.8 % — ABNORMAL LOW (ref 39.0–52.0)
Hemoglobin: 8.8 g/dL — ABNORMAL LOW (ref 13.0–17.0)
Immature Granulocytes: 1 %
Lymphocytes Relative: 23 %
Lymphs Abs: 2.2 10*3/uL (ref 0.7–4.0)
MCH: 32.6 pg (ref 26.0–34.0)
MCHC: 35.5 g/dL (ref 30.0–36.0)
MCV: 91.9 fL (ref 80.0–100.0)
Monocytes Absolute: 1.1 10*3/uL — ABNORMAL HIGH (ref 0.1–1.0)
Monocytes Relative: 11 %
Neutro Abs: 5.7 10*3/uL (ref 1.7–7.7)
Neutrophils Relative %: 61 %
Platelets: 294 10*3/uL (ref 150–400)
RBC: 2.7 MIL/uL — ABNORMAL LOW (ref 4.22–5.81)
RDW: 18.2 % — ABNORMAL HIGH (ref 11.5–15.5)
WBC: 9.5 10*3/uL (ref 4.0–10.5)
nRBC: 0 % (ref 0.0–0.2)

## 2020-08-13 LAB — COMPREHENSIVE METABOLIC PANEL
ALT: 22 U/L (ref 0–44)
AST: 36 U/L (ref 15–41)
Albumin: 2.5 g/dL — ABNORMAL LOW (ref 3.5–5.0)
Alkaline Phosphatase: 80 U/L (ref 38–126)
Anion gap: 10 (ref 5–15)
BUN: <5 mg/dL — ABNORMAL LOW (ref 6–20)
CO2: 23 mmol/L (ref 22–32)
Calcium: 8.8 mg/dL — ABNORMAL LOW (ref 8.9–10.3)
Chloride: 99 mmol/L (ref 98–111)
Creatinine, Ser: 0.89 mg/dL (ref 0.61–1.24)
GFR, Estimated: >60 mL/min (ref >60)
Glucose, Bld: 111 mg/dL — ABNORMAL HIGH (ref 70–99)
Potassium: 4.1 mmol/L (ref 3.5–5.1)
Sodium: 132 mmol/L — ABNORMAL LOW (ref 135–145)
Total Bilirubin: 1 mg/dL (ref 0.3–1.2)
Total Protein: 7.5 g/dL (ref 6.5–8.1)

## 2020-08-13 LAB — MAGNESIUM: Magnesium: 1.4 mg/dL — ABNORMAL LOW (ref 1.7–2.4)

## 2020-08-13 MED ORDER — GABAPENTIN 400 MG PO CAPS
400.0000 mg | ORAL_CAPSULE | Freq: Three times a day (TID) | ORAL | Status: DC
  Administered 2020-08-13 – 2020-08-15 (×7): 400 mg via ORAL
  Filled 2020-08-13 (×7): qty 1

## 2020-08-13 MED ORDER — PANTOPRAZOLE SODIUM 40 MG PO TBEC
40.0000 mg | DELAYED_RELEASE_TABLET | Freq: Every day | ORAL | Status: DC
  Administered 2020-08-13 – 2020-08-15 (×3): 40 mg via ORAL
  Filled 2020-08-13 (×3): qty 1

## 2020-08-13 MED ORDER — GABAPENTIN 300 MG PO CAPS
300.0000 mg | ORAL_CAPSULE | Freq: Once | ORAL | Status: AC
  Administered 2020-08-13: 300 mg via ORAL
  Filled 2020-08-13: qty 1

## 2020-08-13 MED ORDER — MAGNESIUM SULFATE 2 GM/50ML IV SOLN
2.0000 g | Freq: Once | INTRAVENOUS | Status: AC
  Administered 2020-08-13: 2 g via INTRAVENOUS
  Filled 2020-08-13: qty 50

## 2020-08-13 MED ORDER — KETOROLAC TROMETHAMINE 30 MG/ML IJ SOLN
INTRAMUSCULAR | Status: AC
  Administered 2020-08-13: 30 mg
  Filled 2020-08-13: qty 1

## 2020-08-13 MED ORDER — KETOROLAC TROMETHAMINE 30 MG/ML IJ SOLN
30.0000 mg | Freq: Once | INTRAMUSCULAR | Status: AC
  Administered 2020-08-13: 30 mg via INTRAVENOUS
  Filled 2020-08-13: qty 1

## 2020-08-13 NOTE — Progress Notes (Signed)
PROGRESS NOTE    [name]   JTT:[id number]  DOB: 09/02/1990  DOA: 08/03/2020     10  PCP: Harold Grandchild, MD  CC: weakness, AMS  Hospital Course: Harold Flores is a 30 year old male with PMH HIV, noncompliant to medications, essential hypertension, alcohol abuse, generalized anxiety disorder, GERD recently had lower extremity edema of unclear etiology and was started on Lasix following which patient reports not feeling well with generalized weakness.   The swelling improved after Lasix but on arrival to ED he was found to have significantly low potassium.  He was admitted to [facility] for further evaluation and management of severe hypokalemia and lower extremity edema.     On further discussions with the patient's husband, his symptoms have been ongoing for approximately 1 month. He has been non compliant to his HIV meds.  He had then developed multiple loose stools with intermittent confusion and hallucinations. Further collateral information was provided from his mother who also reported that he under reports his amount of drinking.  She states that he drinks an excessive amount of alcohol daily at home.    ID was also consulted given his confusion and concern for possible underlying infectious etiology.  He was recommended for undergoing full back MRI as well as brain MRI and LP.  Imaging studies were negative for any pathology including brain MRI.  LP was also obtained on 08/08/2020 which had normal opening pressure and negative for signs of infection.  Further studies still in process.  He continued to exhibit intermittent episodes of confusion during hospitalization. Studies were reviewed by neurology on 08/09/2020.  Decision was made to start patient on IVIG as well as ganciclovir in conjunction with discussion with ID.  He was also placed on high-dose folic acid and thiamine supplementations.  Interval History:  Mentation still intact.  Able to carry on conversation well this  morning.  There was some concern for anxiety overnight and he received extra Ativan.  States that he associates IVIG infusion with causing increased anxiety.  Also complaining of ongoing burning pain in his feet and no difference noted when gabapentin was started.  ROS: Constitutional: negative for chills and fevers, Respiratory: negative for cough, Cardiovascular: negative for chest pain and Gastrointestinal: negative for abdominal pain  Assessment & Plan: * Acute metabolic encephalopathy - patient symptoms include delirium, hallucinations - etiology considered due to metabolic possibly from infectious source however still may be due to etoh use as well - follow up B1 level, still in process - MRIs are negative as well as prelim results from LP; still other tests pending - treating with 5 days IVIG (started 3/2) for empiric GBS tx - also on ganciclovir (started 3/2), and high-dose thiamine/folic acid and monitor mentation response (seems to have been slowly improving the past couple days)  Alcohol abuse - evening of 3/1 appeared to have further withdrawal symptoms and was given Ativan under CIWA protocol. -Continue CIWA for now; scores now downtrending; mild agitation/tremor reported per patient this am, 3/4 -Continue folate, thiamine, multivitamin  Lower extremity weakness - appears to be slowly improving past few days but still notable - PT/OT recommending CIR - etiology still u/k; possibly severe deconditioning from his excessive alcohol use versus infectious etiology from underlying uncontrolled HIV (followed by neurology and ID) - patient started on IVIG (cannot rule out GBS at this time) per neurology; 5 day course (3/2 - 3/6) -Continue with PT while hospitalized as patient is able - MRI C/TL spine unremarkable  to explain weakness; MRI brain also unremarkable  - continue folic acid  Folic acid deficiency - level 3.4 - has been on oral folate but also having diarrhea on  admission - given some concern for possible malabsorption as well as concern for myelopathy/persistent LE weakness will initiate on high dose course of IV folic acid followed by oral again (discussed with neurology as well)  HIV (human immunodeficiency virus infection) (HCC) - Non compliant to meds. CD4 absolute count is 40, HIV RNA 104,000  - ID following -Biktarvy being started per ID -Bactrim also started for PJP ppx  Diarrhea -Negative GI pathogen panel -As he has continued to have withdrawal symptoms, diarrhea may be related to ongoing alcohol withdrawal vs ART vs (could also be seen in CMV)  Normocytic anemia - downtrend from 2021 (prior 13-15 g/dL), now downtrending further -Iron stores adequate. -Folate low, 3.4 -Vitamin B12 726  Hyponatremia -Consider due to diuresis prior to admission -Has been improving  Hypomagnesemia -Likely from GI losses with diarrhea -Replete and recheck as needed  Hypokalemia - repleting as needed  Essential hypertension, benign -Continue amlodipine and Coreg  GAD (generalized anxiety disorder) -Continue Zoloft  Elevated troponin-resolved as of 08/09/2020 Pt denies any chest pain or sob.  ? From severe hypokalemia , anemia and demand ischemia.  ekg shows sinus tachycardia with t wave inversions in the lateral leads. Echocardiogram no significant abnormalities.  CT angio of the chest negative for PE.  Cardiology consulted for elevated troponins, recommended no further work-up.  Old records reviewed in assessment of this patient  Antimicrobials: Biktarvy 08/09/2020 >> current IVIG 08/09/2020 >> current  Ganciclovir 08/09/2020 >> current Bactrim 08/09/2020 >> current   DVT prophylaxis: SCDs Start: 08/03/20 2229   Code Status:   Code Status: Full Code Family Communication: mother   Disposition Plan: Status is: Inpatient  Remains inpatient appropriate because:Ongoing diagnostic testing needed not appropriate for outpatient work up, IV  treatments appropriate due to intensity of illness or inability to take PO and Inpatient level of care appropriate due to severity of illness   Dispo: The patient is from: Home              Anticipated d/c is to: possibly CIR              Patient currently is not medically stable to d/c.   Difficult to place patient No Risk of unplanned readmission score: Unplanned Admission- Pilot do not use: 12.5   Objective: Blood pressure (!) 143/91, pulse (!) 101, temperature 99 F (37.2 C), temperature source Oral, resp. rate 16, height 5\' 10"  (1.778 m), weight 82.8 kg, SpO2 100 %.  Examination: General appearance: more awake/alert; sitting up in bed dressed now and in NAD Head: Normocephalic, without obvious abnormality, atraumatic Eyes: EOMI Lungs: clear to auscultation bilaterally Heart: regular rate and rhythm and S1, S2 normal Abdomen: normal findings: bowel sounds normal and soft, non-tender Extremities: no edema Skin: mobility and turgor normal Neurologic: slowed mentation but has been slowly improving; 5/5 LUE/RUE strength. B/L LE strength 4/5 (improved from 2/5, still question his effort at times); sensation intact to subjectively  Consultants:   ID  IR  Neurology   Procedures:   LP, 3/1  Data Reviewed: I have personally reviewed following labs and imaging studies Results for orders placed or performed during the hospital encounter of 08/03/20 (from the past 24 hour(s))  CBC with Differential/Platelet     Status: Abnormal   Collection Time: 08/13/20  1:19 AM  Result  Value Ref Range   WBC 9.5 4.0 - 10.5 K/uL   RBC 2.70 (L) 4.22 - 5.81 MIL/uL   Hemoglobin 8.8 (L) 13.0 - 17.0 g/dL   HCT 16.1 (L) 09.6 - 04.5 %   MCV 91.9 80.0 - 100.0 fL   MCH 32.6 26.0 - 34.0 pg   MCHC 35.5 30.0 - 36.0 g/dL   RDW 40.9 (H) 81.1 - 91.4 %   Platelets 294 150 - 400 K/uL   nRBC 0.0 0.0 - 0.2 %   Neutrophils Relative % 61 %   Neutro Abs 5.7 1.7 - 7.7 K/uL   Lymphocytes Relative 23 %   Lymphs  Abs 2.2 0.7 - 4.0 K/uL   Monocytes Relative 11 %   Monocytes Absolute 1.1 (H) 0.1 - 1.0 K/uL   Eosinophils Relative 4 %   Eosinophils Absolute 0.4 0.0 - 0.5 K/uL   Basophils Relative 0 %   Basophils Absolute 0.0 0.0 - 0.1 K/uL   Immature Granulocytes 1 %   Abs Immature Granulocytes 0.12 (H) 0.00 - 0.07 K/uL  Magnesium     Status: Abnormal   Collection Time: 08/13/20  1:19 AM  Result Value Ref Range   Magnesium 1.4 (L) 1.7 - 2.4 mg/dL  Comprehensive metabolic panel     Status: Abnormal   Collection Time: 08/13/20  1:19 AM  Result Value Ref Range   Sodium 132 (L) 135 - 145 mmol/L   Potassium 4.1 3.5 - 5.1 mmol/L   Chloride 99 98 - 111 mmol/L   CO2 23 22 - 32 mmol/L   Glucose, Bld 111 (H) 70 - 99 mg/dL   BUN <5 (L) 6 - 20 mg/dL   Creatinine, Ser 7.82 0.61 - 1.24 mg/dL   Calcium 8.8 (L) 8.9 - 10.3 mg/dL   Total Protein 7.5 6.5 - 8.1 g/dL   Albumin 2.5 (L) 3.5 - 5.0 g/dL   AST 36 15 - 41 U/L   ALT 22 0 - 44 U/L   Alkaline Phosphatase 80 38 - 126 U/L   Total Bilirubin 1.0 0.3 - 1.2 mg/dL   GFR, Estimated >95 >62 mL/min   Anion gap 10 5 - 15    Recent Results (from the past 240 hour(s))  SARS CORONAVIRUS 2 (TAT 6-24 HRS) Nasopharyngeal Nasopharyngeal Swab     Status: None   Collection Time: 08/03/20 10:00 PM   Specimen: Nasopharyngeal Swab  Result Value Ref Range Status   SARS Coronavirus 2 NEGATIVE NEGATIVE Final    Comment: (NOTE) SARS-CoV-2 target nucleic acids are NOT DETECTED.  The SARS-CoV-2 RNA is generally detectable in upper and lower respiratory specimens during the acute phase of infection. Negative results do not preclude SARS-CoV-2 infection, do not rule out co-infections with other pathogens, and should not be used as the sole basis for treatment or other patient management decisions. Negative results must be combined with clinical observations, patient history, and epidemiological information. The expected result is Negative.  Fact Sheet for  Patients: HairSlick.no  Fact Sheet for Healthcare Providers: quierodirigir.com  This test is not yet approved or cleared by the [place] FDA and  has been authorized for detection and/or diagnosis of SARS-CoV-2 by FDA under an Emergency Use Authorization (EUA). This EUA will remain  in effect (meaning this test can be used) for the duration of the COVID-19 declaration under Se ction 564(b)(1) of the Act, 21 U.S.C. section 360bbb-3(b)(1), unless the authorization is terminated or revoked sooner.  Performed at [facility], [address] [place], [place] [address]   Gastrointestinal Panel by PCR , Stool     Status: None   Collection Time: 08/06/20  4:59 PM   Specimen: Stool  Result Value Ref Range Status   Campylobacter species NOT DETECTED NOT DETECTED Final   Plesimonas shigelloides NOT DETECTED NOT DETECTED Final   Salmonella species NOT DETECTED NOT DETECTED Final   Yersinia enterocolitica NOT DETECTED NOT DETECTED Final   Vibrio species NOT DETECTED NOT DETECTED Final   Vibrio cholerae NOT DETECTED NOT DETECTED Final   Enteroaggregative E coli (EAEC) NOT DETECTED NOT DETECTED Final   Enteropathogenic E coli (EPEC) NOT DETECTED NOT DETECTED Final   Enterotoxigenic E coli (ETEC) NOT DETECTED NOT DETECTED Final   Shiga like toxin producing E coli (STEC) NOT DETECTED NOT DETECTED Final   Shigella/Enteroinvasive E coli (EIEC) NOT DETECTED NOT DETECTED Final   Cryptosporidium NOT DETECTED NOT DETECTED Final   Cyclospora cayetanensis NOT DETECTED NOT DETECTED Final   Entamoeba histolytica NOT DETECTED NOT DETECTED Final   Giardia lamblia NOT DETECTED NOT DETECTED Final   Adenovirus F40/41 NOT DETECTED NOT DETECTED Final   Astrovirus NOT DETECTED NOT DETECTED Final   Norovirus GI/GII NOT DETECTED NOT DETECTED Final   Rotavirus A NOT DETECTED NOT DETECTED Final   Sapovirus (I, II, IV, and V) NOT DETECTED NOT DETECTED  Final    Comment: Performed at [facility], [address] [place], [place] [address]  Culture, fungus without smear     Status: None (Preliminary result)   Collection Time: 08/07/20  1:28 PM   Specimen: CSF; Cerebrospinal Fluid  Result Value Ref Range Status   Specimen Description CSF  Final   Special Requests Immunocompromised  Final   Culture   Final    NO FUNGUS ISOLATED AFTER 5 DAYS Performed at [facility], [address] [place], [place] [address]    Report Status PENDING  Incomplete  CSF culture     Status: None   Collection Time: 08/07/20  1:28 PM   Specimen: CSF; Cerebrospinal Fluid  Result Value Ref Range Status   Specimen Description CSF  Final   Special Requests Immunocompromised  Final   Gram Stain NO WBC SEEN NO ORGANISMS SEEN   Final   Culture   Final    NO GROWTH 3 DAYS Performed at [facility], [address] [place], [place] [address]    Report Status 08/12/2020 FINAL  Final  MRSA PCR Screening     Status: None   Collection Time: 08/08/20  5:34 AM   Specimen: Nasopharyngeal  Result Value Ref Range Status   MRSA by PCR NEGATIVE NEGATIVE Final    Comment:        The GeneXpert MRSA Assay (FDA approved for NASAL specimens only), is one component of a comprehensive MRSA colonization surveillance program. It is not intended to diagnose MRSA infection nor to guide or monitor treatment for MRSA infections. Performed at [facility], [address], [place], [place] [address]   Georgana Curio, PCR     Status: None   Collection Time: 08/09/20  8:28 AM   Specimen: Blood  Result Value Ref Range Status   Toxoplasma Gondii, PCR Negative Negative Final    Comment: (NOTE) No Toxoplasma gondii DNA detected. This test was developed and its performance characteristics determined by [employer]. It has not been cleared or approved by the U.S. Food and Drug Administration. The FDA has determined that such clearance or  approval is not necessary. This test is used for clinical purposes. It should not be regarded as investigational or research. Performed At: [facility] [address] [place], [place] [id number] Jolene SchimkeNagendra Sanjai MD WU:[id number]:[phone]      Radiology Studies: No results found. MR BRAIN W WO CONTRAST  Final Result    MR CERVICAL SPINE W WO CONTRAST  Final Result    MR Lumbar Spine W Wo Contrast  Final Result    MR THORACIC SPINE W WO CONTRAST  Final Result    DG Abd 1 View  Final Result    CT ANGIO CHEST PE W OR WO CONTRAST  Final Result    DG Chest Port 1 View  Final Result      Scheduled Meds: . amLODipine  10 mg Oral Daily  . bictegravir-emtricitabine-tenofovir AF  1 tablet Oral Daily  . carvedilol  6.25 mg Oral BID WC  . [START ON 08/14/2020] folic acid  1 mg Oral Daily  . gabapentin  300 mg Oral TID  . Gerhardt's butt cream   Topical TID  . multivitamin with minerals  1 tablet Oral Daily  . pantoprazole  40 mg Oral Daily  . sertraline  100 mg Oral Daily  . sulfamethoxazole-trimethoprim  1 tablet Oral Daily  . thiamine  100 mg Oral Daily   PRN Meds: lip balm, loperamide, LORazepam, ondansetron **OR** ondansetron (ZOFRAN) IV Continuous Infusions: . ganciclovir 415 mg (08/12/20 2201)  . Immune Globulin 10% 35 g (08/12/20 2340)     LOS: 10 days  Time spent: Greater than 50% of the 35 minute visit was spent in counseling/coordination of care for the patient as laid out in the A&P.   Lewie Chamberavid Girguis, MD [facility] 08/13/2020, 3:55 PM

## 2020-08-13 NOTE — Progress Notes (Signed)
[facility] notified that patient stated that he is in need of more Ativan and having pain in his hands and feet 8/10.

## 2020-08-13 NOTE — Progress Notes (Signed)
[facility]   Reason for visit: Follow up on AMS  Interval History: WBC wnl, afebrile.  No acute events Day 5 ganciclovir Biktarvy Sulfamethoxazole   Physical Exam: Constitutional:  Vitals:   08/13/20 0100 08/13/20 0412  BP: (!) 150/94 (!) 143/91  Pulse: (!) 109 (!) 101  Resp:  16  Temp: 98.6 F (37 C) 99 F (37.2 C)  SpO2: 100% 100%   he is alert, responds appropriately to questions Respiratory: Normal respiratory effort, CTA B Cardiovascular: RRR Skin: no rashes MS: moving his lower extremities  Review of Systems: Constitutional: no fever GI: no diarrhea  Lab Results  Component Value Date   WBC 9.5 08/13/2020   HGB 8.8 (L) 08/13/2020   HCT 24.8 (L) 08/13/2020   MCV 91.9 08/13/2020   PLT 294 08/13/2020    Lab Results  Component Value Date   CREATININE 0.89 08/13/2020   BUN <5 (L) 08/13/2020   NA 132 (L) 08/13/2020   K 4.1 08/13/2020   CL 99 08/13/2020   CO2 23 08/13/2020    Lab Results  Component Value Date   ALT 22 08/13/2020   AST 36 08/13/2020   ALKPHOS 80 08/13/2020     Microbiology: Recent Results (from the past 240 hour(s))  SARS CORONAVIRUS 2 (TAT 6-24 HRS) Nasopharyngeal Nasopharyngeal Swab     Status: None   Collection Time: 08/03/20 10:00 PM   Specimen: Nasopharyngeal Swab  Result Value Ref Range Status   SARS Coronavirus 2 NEGATIVE NEGATIVE Final    Comment: (NOTE) SARS-CoV-2 target nucleic acids are NOT DETECTED.  The SARS-CoV-2 RNA is generally detectable in upper and lower respiratory specimens during the acute phase of infection. Negative results do not preclude SARS-CoV-2 infection, do not rule out co-infections with other pathogens, and should not be used as the sole basis for treatment or other patient management decisions. Negative results must be combined with clinical observations, patient history, and epidemiological information. The expected result is Negative.  Fact Sheet for  Patients: HairSlick.no  Fact Sheet for Healthcare Providers: quierodirigir.com  This test is not yet approved or cleared by the [place] FDA and  has been authorized for detection and/or diagnosis of SARS-CoV-2 by FDA under an Emergency Use Authorization (EUA). This EUA will remain  in effect (meaning this test can be used) for the duration of the COVID-19 declaration under Se ction 564(b)(1) of the Act, 21 U.S.C. section 360bbb-3(b)(1), unless the authorization is terminated or revoked sooner.  Performed at [facility], [address] [place], [place] [address]   Gastrointestinal Panel by PCR , Stool     Status: None   Collection Time: 08/06/20  4:59 PM   Specimen: Stool  Result Value Ref Range Status   Campylobacter species NOT DETECTED NOT DETECTED Final   Plesimonas shigelloides NOT DETECTED NOT DETECTED Final   Salmonella species NOT DETECTED NOT DETECTED Final   Yersinia enterocolitica NOT DETECTED NOT DETECTED Final   Vibrio species NOT DETECTED NOT DETECTED Final   Vibrio cholerae NOT DETECTED NOT DETECTED Final   Enteroaggregative E coli (EAEC) NOT DETECTED NOT DETECTED Final   Enteropathogenic E coli (EPEC) NOT DETECTED NOT DETECTED Final   Enterotoxigenic E coli (ETEC) NOT DETECTED NOT DETECTED Final   Shiga like toxin producing E coli (STEC) NOT DETECTED NOT DETECTED Final   Shigella/Enteroinvasive E coli (EIEC) NOT DETECTED NOT DETECTED Final   Cryptosporidium NOT DETECTED NOT DETECTED Final   Cyclospora cayetanensis NOT DETECTED  NOT DETECTED Final   Entamoeba histolytica NOT DETECTED NOT DETECTED Final   Giardia lamblia NOT DETECTED NOT DETECTED Final   Adenovirus F40/41 NOT DETECTED NOT DETECTED Final   Astrovirus NOT DETECTED NOT DETECTED Final   Norovirus GI/GII NOT DETECTED NOT DETECTED Final   Rotavirus A NOT DETECTED NOT DETECTED Final   Sapovirus (I, II, IV, and V) NOT DETECTED NOT DETECTED  Final    Comment: Performed at [facility], [address] [place], [place] [address]  Culture, fungus without smear     Status: None (Preliminary result)   Collection Time: 08/07/20  1:28 PM   Specimen: CSF; Cerebrospinal Fluid  Result Value Ref Range Status   Specimen Description CSF  Final   Special Requests Immunocompromised  Final   Culture   Final    NO FUNGUS ISOLATED AFTER 3 DAYS Performed at [facility], [address] [place], [place] [address]    Report Status PENDING  Incomplete  CSF culture     Status: None   Collection Time: 08/07/20  1:28 PM   Specimen: CSF; Cerebrospinal Fluid  Result Value Ref Range Status   Specimen Description CSF  Final   Special Requests Immunocompromised  Final   Gram Stain NO WBC SEEN NO ORGANISMS SEEN   Final   Culture   Final    NO GROWTH 3 DAYS Performed at [facility], [address] [place], [place] [address]    Report Status 08/12/2020 FINAL  Final  MRSA PCR Screening     Status: None   Collection Time: 08/08/20  5:34 AM   Specimen: Nasopharyngeal  Result Value Ref Range Status   MRSA by PCR NEGATIVE NEGATIVE Final    Comment:        The GeneXpert MRSA Assay (FDA approved for NASAL specimens only), is one component of a comprehensive MRSA colonization surveillance program. It is not intended to diagnose MRSA infection nor to guide or monitor treatment for MRSA infections. Performed at [facility], [address] [place], [place] [address]   Georgana Curio, PCR     Status: None   Collection Time: 08/09/20  8:28 AM   Specimen: Blood  Result Value Ref Range Status   Toxoplasma Gondii, PCR Negative Negative Final    Comment: (NOTE) No Toxoplasma gondii DNA detected. This test was developed and its performance characteristics determined by [employer]. It has not been cleared or approved by the U.S. Food and Drug Administration. The FDA has determined that such clearance or  approval is not necessary. This test is used for clinical purposes. It should not be regarded as investigational or research. Performed At: [facility] [address] [place], [place] [id number] Jolene Schimke MD [id number]     Impression/Plan:  1. Encephalopathy - no positive findings.  On ganciclovir for concern for CMV and test still pending from CSF.  Test in positive in blood with level < 200.  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 Followed by neurology.   2.  HIV - on Biktarvy and no issues. Will continue  3.  OI prophylaxis - he will continue with SMX/TMP  Dr. Daiva Eves back tomorrow

## 2020-08-14 LAB — CBC WITH DIFFERENTIAL/PLATELET
Abs Immature Granulocytes: 0.05 10*3/uL (ref 0.00–0.07)
Basophils Absolute: 0 10*3/uL (ref 0.0–0.1)
Basophils Relative: 0 %
Eosinophils Absolute: 0.3 10*3/uL (ref 0.0–0.5)
Eosinophils Relative: 5 %
HCT: 24.6 % — ABNORMAL LOW (ref 39.0–52.0)
Hemoglobin: 8.7 g/dL — ABNORMAL LOW (ref 13.0–17.0)
Immature Granulocytes: 1 %
Lymphocytes Relative: 34 %
Lymphs Abs: 2 10*3/uL (ref 0.7–4.0)
MCH: 32.5 pg (ref 26.0–34.0)
MCHC: 35.4 g/dL (ref 30.0–36.0)
MCV: 91.8 fL (ref 80.0–100.0)
Monocytes Absolute: 0.7 10*3/uL (ref 0.1–1.0)
Monocytes Relative: 12 %
Neutro Abs: 2.9 10*3/uL (ref 1.7–7.7)
Neutrophils Relative %: 48 %
Platelets: 322 10*3/uL (ref 150–400)
RBC: 2.68 MIL/uL — ABNORMAL LOW (ref 4.22–5.81)
RDW: 18.2 % — ABNORMAL HIGH (ref 11.5–15.5)
WBC: 5.9 10*3/uL (ref 4.0–10.5)
nRBC: 0 % (ref 0.0–0.2)

## 2020-08-14 LAB — COMPREHENSIVE METABOLIC PANEL
ALT: 23 U/L (ref 0–44)
AST: 33 U/L (ref 15–41)
Albumin: 2.4 g/dL — ABNORMAL LOW (ref 3.5–5.0)
Alkaline Phosphatase: 75 U/L (ref 38–126)
Anion gap: 8 (ref 5–15)
BUN: 6 mg/dL (ref 6–20)
CO2: 22 mmol/L (ref 22–32)
Calcium: 8.6 mg/dL — ABNORMAL LOW (ref 8.9–10.3)
Chloride: 102 mmol/L (ref 98–111)
Creatinine, Ser: 0.88 mg/dL (ref 0.61–1.24)
GFR, Estimated: >60 mL/min (ref >60)
Glucose, Bld: 91 mg/dL (ref 70–99)
Potassium: 4.4 mmol/L (ref 3.5–5.1)
Sodium: 132 mmol/L — ABNORMAL LOW (ref 135–145)
Total Bilirubin: 0.6 mg/dL (ref 0.3–1.2)
Total Protein: 8.2 g/dL — ABNORMAL HIGH (ref 6.5–8.1)

## 2020-08-14 LAB — MAGNESIUM: Magnesium: 1.5 mg/dL — ABNORMAL LOW (ref 1.7–2.4)

## 2020-08-14 LAB — VITAMIN B1: Vitamin B1 (Thiamine): 141.1 nmol/L (ref 66.5–200.0)

## 2020-08-14 MED ORDER — CARVEDILOL 12.5 MG PO TABS
12.5000 mg | ORAL_TABLET | Freq: Two times a day (BID) | ORAL | Status: DC
  Administered 2020-08-14 – 2020-08-15 (×4): 12.5 mg via ORAL
  Filled 2020-08-14 (×4): qty 1

## 2020-08-14 MED ORDER — CARBAMIDE PEROXIDE 6.5 % OT SOLN
5.0000 [drp] | Freq: Two times a day (BID) | OTIC | Status: DC
  Administered 2020-08-14 – 2020-08-15 (×2): 5 [drp] via OTIC
  Filled 2020-08-14: qty 15

## 2020-08-14 MED ORDER — MAGNESIUM OXIDE 400 (241.3 MG) MG PO TABS
800.0000 mg | ORAL_TABLET | Freq: Every day | ORAL | Status: DC
  Administered 2020-08-14 – 2020-08-15 (×2): 800 mg via ORAL
  Filled 2020-08-14 (×2): qty 2

## 2020-08-14 NOTE — Progress Notes (Signed)
Subjective: He is worried about whether he will ever regain his strength in his lower extremities.  Antibiotics:  Anti-infectives (From admission, onward)   Start     Dose/Rate Route Frequency Ordered Stop   08/11/20 2100  ganciclovir (CYTOVENE) 415 mg in sodium chloride 0.9 % 100 mL IVPB  Status:  Discontinued       Note to Pharmacy: Monitor renal funtion and dose adjust   5 mg/kg  82.8 kg 100 mL/hr over 60 Minutes Intravenous Every 12 hours 08/11/20 1755 08/14/20 1049   08/09/20 1830  ganciclovir (CYTOVENE) 415 mg in sodium chloride 0.9 % 100 mL IVPB  Status:  Discontinued       Note to Pharmacy: Monitor renal funtion and dose adjust   5 mg/kg  82.8 kg 100 mL/hr over 60 Minutes Intravenous Every 12 hours 08/09/20 1649 08/11/20 1755   08/09/20 1115  sulfamethoxazole-trimethoprim (BACTRIM DS) 800-160 MG per tablet 1 tablet        1 tablet Oral Daily 08/09/20 1015     08/09/20 1100  bictegravir-emtricitabine-tenofovir AF (BIKTARVY) 50-200-25 MG per tablet 1 tablet        1 tablet Oral Daily 08/09/20 1008     08/04/20 1000  abacavir-dolutegravir-lamiVUDine (TRIUMEQ) 600-50-300 MG per tablet 1 tablet  Status:  Discontinued        1 tablet Oral Daily 08/04/20 0820 08/07/20 1044      Medications: Scheduled Meds: . amLODipine  10 mg Oral Daily  . bictegravir-emtricitabine-tenofovir AF  1 tablet Oral Daily  . carvedilol  12.5 mg Oral BID WC  . folic acid  1 mg Oral Daily  . gabapentin  400 mg Oral TID  . Gerhardt's butt cream   Topical TID  . magnesium oxide  800 mg Oral Daily  . multivitamin with minerals  1 tablet Oral Daily  . pantoprazole  40 mg Oral Daily  . sertraline  100 mg Oral Daily  . sulfamethoxazole-trimethoprim  1 tablet Oral Daily  . thiamine  100 mg Oral Daily   Continuous Infusions: PRN Meds:.lip balm, loperamide, LORazepam, ondansetron **OR** ondansetron (ZOFRAN) IV    Objective: Weight change:   Intake/Output Summary (Last 24 hours) at  08/14/2020 1400 Last data filed at 08/14/2020 0900 Gross per 24 hour  Intake 240 ml  Output 450 ml  Net -210 ml   Blood pressure (!) 139/98, pulse (!) 103, temperature 98.9 F (37.2 C), temperature source Oral, resp. rate 17, height 5\' 10"  (1.778 m), weight 82.8 kg, SpO2 100 %. Temp:  [98.5 F (36.9 C)-99.9 F (37.7 C)] 98.9 F (37.2 C) (03/07 0758) Pulse Rate:  [97-108] 103 (03/07 0758) Resp:  [17-18] 17 (03/07 0758) BP: (133-155)/(70-98) 139/98 (03/07 0758) SpO2:  [100 %] 100 % (03/07 0758)  Physical Exam: Physical Exam Constitutional:      Appearance: He is well-developed.  HENT:     Head: Normocephalic and atraumatic.  Eyes:     Extraocular Movements: Extraocular movements intact.     Conjunctiva/sclera: Conjunctivae normal.  Cardiovascular:     Rate and Rhythm: Normal rate and regular rhythm.  Pulmonary:     Effort: Pulmonary effort is normal. No respiratory distress.     Breath sounds: No wheezing.  Abdominal:     General: There is no distension.     Palpations: Abdomen is soft.  Musculoskeletal:        General: Normal range of motion.     Cervical back: Normal range of motion  and neck supple.  Skin:    General: Skin is warm and dry.     Coloration: Skin is not pale.     Findings: No erythema or rash.  Neurological:     Mental Status: He is lethargic.  Psychiatric:        Attention and Perception: Attention normal.        Mood and Affect: Mood is depressed.        Speech: Speech normal.        Behavior: Behavior normal.        Thought Content: Thought content normal.      CBC:    BMET Recent Labs    08/13/20 0119 08/14/20 0354  NA 132* 132*  K 4.1 4.4  CL 99 102  CO2 23 22  GLUCOSE 111* 91  BUN <5* 6  CREATININE 0.89 0.88  CALCIUM 8.8* 8.6*     Liver Panel  Recent Labs    08/13/20 0119 08/14/20 0354  PROT 7.5 8.2*  ALBUMIN 2.5* 2.4*  AST 36 33  ALT 22 23  ALKPHOS 80 75  BILITOT 1.0 0.6       Sedimentation Rate No results for  input(s): ESRSEDRATE in the last 72 hours. C-Reactive Protein No results for input(s): CRP in the last 72 hours.  Micro Results: Recent Results (from the past 720 hour(s))  SARS CORONAVIRUS 2 (TAT 6-24 HRS) Nasopharyngeal Nasopharyngeal Swab     Status: None   Collection Time: 08/03/20 10:00 PM   Specimen: Nasopharyngeal Swab  Result Value Ref Range Status   SARS Coronavirus 2 NEGATIVE NEGATIVE Final    Comment: (NOTE) SARS-CoV-2 target nucleic acids are NOT DETECTED.  The SARS-CoV-2 RNA is generally detectable in upper and lower respiratory specimens during the acute phase of infection. Negative results do not preclude SARS-CoV-2 infection, do not rule out co-infections with other pathogens, and should not be used as the sole basis for treatment or other patient management decisions. Negative results must be combined with clinical observations, patient history, and epidemiological information. The expected result is Negative.  Fact Sheet for Patients: HairSlick.no  Fact Sheet for Healthcare Providers: quierodirigir.com  This test is not yet approved or cleared by the [place] FDA and  has been authorized for detection and/or diagnosis of SARS-CoV-2 by FDA under an Emergency Use Authorization (EUA). This EUA will remain  in effect (meaning this test can be used) for the duration of the COVID-19 declaration under Se ction 564(b)(1) of the Act, 21 U.S.C. section 360bbb-3(b)(1), unless the authorization is terminated or revoked sooner.  Performed at [facility], [address], [place], [place] [address]   Gastrointestinal Panel by PCR , Stool     Status: None   Collection Time: 08/06/20  4:59 PM   Specimen: Stool  Result Value Ref Range Status   Campylobacter species NOT DETECTED NOT DETECTED Final   Plesimonas shigelloides NOT DETECTED NOT DETECTED Final   Salmonella species NOT DETECTED NOT DETECTED Final    Yersinia enterocolitica NOT DETECTED NOT DETECTED Final   Vibrio species NOT DETECTED NOT DETECTED Final   Vibrio cholerae NOT DETECTED NOT DETECTED Final   Enteroaggregative E coli (EAEC) NOT DETECTED NOT DETECTED Final   Enteropathogenic E coli (EPEC) NOT DETECTED NOT DETECTED Final   Enterotoxigenic E coli (ETEC) NOT DETECTED NOT DETECTED Final   Shiga like toxin producing E coli (STEC) NOT DETECTED NOT DETECTED Final   Shigella/Enteroinvasive E coli (EIEC) NOT DETECTED NOT DETECTED Final  Cryptosporidium NOT DETECTED NOT DETECTED Final   Cyclospora cayetanensis NOT DETECTED NOT DETECTED Final   Entamoeba histolytica NOT DETECTED NOT DETECTED Final   Giardia lamblia NOT DETECTED NOT DETECTED Final   Adenovirus F40/41 NOT DETECTED NOT DETECTED Final   Astrovirus NOT DETECTED NOT DETECTED Final   Norovirus GI/GII NOT DETECTED NOT DETECTED Final   Rotavirus A NOT DETECTED NOT DETECTED Final   Sapovirus (I, II, IV, and V) NOT DETECTED NOT DETECTED Final    Comment: Performed at [facility], [address] [place], [place] [address]  Culture, fungus without smear     Status: None (Preliminary result)   Collection Time: 08/07/20  1:28 PM   Specimen: CSF; Cerebrospinal Fluid  Result Value Ref Range Status   Specimen Description CSF  Final   Special Requests Immunocompromised  Final   Culture   Final    NO FUNGUS ISOLATED AFTER 6 DAYS Performed at [facility], [address] [place], [place] [address]    Report Status PENDING  Incomplete  CSF culture     Status: None   Collection Time: 08/07/20  1:28 PM   Specimen: CSF; Cerebrospinal Fluid  Result Value Ref Range Status   Specimen Description CSF  Final   Special Requests Immunocompromised  Final   Gram Stain NO WBC SEEN NO ORGANISMS SEEN   Final   Culture   Final    NO GROWTH 3 DAYS Performed at [facility], [address], [place], [place] [address]    Report Status 08/12/2020 FINAL  Final  MRSA  PCR Screening     Status: None   Collection Time: 08/08/20  5:34 AM   Specimen: Nasopharyngeal  Result Value Ref Range Status   MRSA by PCR NEGATIVE NEGATIVE Final    Comment:        The GeneXpert MRSA Assay (FDA approved for NASAL specimens only), is one component of a comprehensive MRSA colonization surveillance program. It is not intended to diagnose MRSA infection nor to guide or monitor treatment for MRSA infections. Performed at [facility], [address], [place], [place] [address]   Georgana Curio, PCR     Status: None   Collection Time: 08/09/20  8:28 AM   Specimen: Blood  Result Value Ref Range Status   Toxoplasma Gondii, PCR Negative Negative Final    Comment: (NOTE) No Toxoplasma gondii DNA detected. This test was developed and its performance characteristics determined by [employer]. It has not been cleared or approved by the U.S. Food and Drug Administration. The FDA has determined that such clearance or approval is not necessary. This test is used for clinical purposes. It should not be regarded as investigational or research. Performed At: [facility] [address] [place], [place] [id number] Jolene Schimke MD [id number]     Studies/Results: No results found.    Assessment/Plan:  INTERVAL HISTORY:   CMV PCR from spinal fluid is negative  Principal Problem:   Acute metabolic encephalopathy Active Problems:   GAD (generalized anxiety disorder)   Essential hypertension, benign   Gastroesophageal reflux disease without esophagitis   HIV (human immunodeficiency virus infection) (HCC)   Hypokalemia   Lower extremity weakness   Alcohol abuse   Normocytic anemia   Diarrhea   Hypomagnesemia   Hyponatremia   Folic acid deficiency   Encephalopathy    Harold Flores is a 30 y.o. male with  HIV, AIDS with loss of strength in his lower  extremities gait instability and now numbness in his hands as well as feet  encephalopathy waxing and waning neurological status.  Encephalopathy and neuro deficits:  Not completely clear to me what is causing this but could be related to his alcohol abuse he is getting thiamine and folate.  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

## 2020-08-14 NOTE — Progress Notes (Signed)
Physical Therapy Treatment Patient Details Name: Harold Flores MRN: [id number] DOB: Apr 06, 1991 Today's Date: 08/14/2020    History of Present Illness 30 year old gentleman prior history of HIV, noncompliant to medications, essential hypertension, alcohol abuse, generalized anxiety disorder, GERD recently had lower extremity edema of unclear etiology and was started on  Lasix, Admitted 08/03/20 with progressive weakness , significantly low potassium of 2.    PT Comments    Pt very motivated this session to participate in PT.  Pt continues to require moderate assistance for transfers and gt training.  Mild scissoring noted with decreased coordination and balance.  Pt continues to benefit from rehab in a post acute setting to maximize functional gains before returning home.     Follow Up Recommendations  CIR     Equipment Recommendations  Wheelchair (measurements PT);Wheelchair cushion (measurements PT);Rolling walker with 5" wheels    Recommendations for Other Services Rehab consult     Precautions / Restrictions Precautions Precautions: Fall Precaution Comments: B buckling noted with gt training. Restrictions Weight Bearing Restrictions: No    Mobility  Bed Mobility Overal bed mobility: Needs Assistance       Supine to sit: Min guard     General bed mobility comments: Min guard to move to edge of bed with increased time and effort.    Transfers Overall transfer level: Needs assistance Equipment used: Rolling walker (2 wheeled) Transfers: Sit to/from Stand Sit to Stand: Mod assist         General transfer comment: Cues for hand placement to rise into standing.  Heavy mod assistance to boost into standing.  Ambulation/Gait Ambulation/Gait assistance: Mod assist Gait Distance (Feet): 80 Feet Assistive device: Rolling walker (2 wheeled) Gait Pattern/deviations: Step-through pattern;Step-to pattern;Ataxic;Narrow base of support;Scissoring;Decreased stride  length;Decreased dorsiflexion - right;Decreased dorsiflexion - left     General Gait Details: Pt presents with B foot slap and B intermittent buckling in stance phase. Close chair follow for safety.  LOB noted x 2.  Pt required cues to increase BOS.  Increased gt distance this session. Posterior bias pulling RW causing wheels to rise assistance to ground RW for support.   Stairs             Wheelchair Mobility    Modified Rankin (Stroke Patients Only)       Balance Overall balance assessment: Needs assistance Sitting-balance support: Feet supported;Bilateral upper extremity supported Sitting balance-Leahy Scale: Good     Standing balance support: Bilateral upper extremity supported Standing balance-Leahy Scale: Poor Standing balance comment: Heavy reliance on BUEs and external assistance.                            Cognition Arousal/Alertness: Awake/alert Behavior During Therapy: WFL for tasks assessed/performed;Anxious Overall Cognitive Status: Within Functional Limits for tasks assessed Area of Impairment: Safety/judgement;Problem solving                         Safety/Judgement: Decreased awareness of deficits;Decreased awareness of safety   Problem Solving: Slow processing;Requires verbal cues;Requires tactile cues;Difficulty sequencing        Exercises General Exercises - Lower Extremity Quad Sets: AROM;Both;10 reps;Supine Long Arc Quad: AROM;Both;Seated;15 reps Hip ABduction/ADduction: AROM;Both;10 reps;Supine Hip Flexion/Marching: AROM;Both;Seated;10 reps    General Comments        Pertinent Vitals/Pain Pain Assessment: No/denies pain Faces Pain Scale: No hurt    Home Living  Prior Function            PT Goals (current goals can now be found in the care plan section) Acute Rehab PT Goals Patient Stated Goal: to get better Potential to Achieve Goals: Fair Additional Goals Additional Goal #1:  propel Wc x 50' with supervision Progress towards PT goals: Progressing toward goals    Frequency    Min 3X/week      PT Plan Current plan remains appropriate    Co-evaluation              AM-PAC PT "6 Clicks" Mobility   Outcome Measure  Help needed turning from your back to your side while in a flat bed without using bedrails?: A Little Help needed moving from lying on your back to sitting on the side of a flat bed without using bedrails?: A Little Help needed moving to and from a bed to a chair (including a wheelchair)?: A Lot Help needed standing up from a chair using your arms (e.g., wheelchair or bedside chair)?: A Lot Help needed to walk in hospital room?: A Lot Help needed climbing 3-5 steps with a railing? : Total 6 Click Score: 13    End of Session Equipment Utilized During Treatment: Gait belt Activity Tolerance: Patient tolerated treatment well Patient left: in chair;with call bell/phone within reach;with chair alarm set Nurse Communication: Mobility status;Need for lift equipment PT Visit Diagnosis: Muscle weakness (generalized) (M62.81);Other symptoms and signs involving the nervous system (R29.898);Repeated falls (R29.6)     Time: 7510-2585 PT Time Calculation (min) (ACUTE ONLY): 29 min  Charges:  $Gait Training: 8-22 mins $Therapeutic Exercise: 8-22 mins                     Bonney Leitz , PTA Acute Rehabilitation Services Pager [phone] Office [phone]     Tamica Covell Artis Delay 08/14/2020, 3:55 PM

## 2020-08-14 NOTE — Progress Notes (Signed)
PROGRESS NOTE    Harold Flores   UEA:[id number]  DOB: 05/16/1991  DOA: 08/03/2020     11  PCP: Etta GrandchildJones, Thomas L, MD  CC: weakness, AMS  Hospital Course: [name] is a 30 year old male with PMH HIV, noncompliant to medications, essential hypertension, alcohol abuse, generalized anxiety disorder, GERD recently had lower extremity edema of unclear etiology and was started on Lasix following which patient reports not feeling well with generalized weakness.   The swelling improved after Lasix but on arrival to ED he was found to have significantly low potassium.  [facility] was [facility] for further evaluation and management of severe hypokalemia and lower extremity edema.     On further discussions with the patient's husband, his symptoms have been ongoing for approximately 1 month. He has been non compliant to his HIV meds.  He had then developed multiple loose stools with intermittent confusion and hallucinations. Further collateral information was provided from his mother who also reported that he under reports his amount of drinking.  She states that he drinks an excessive amount of alcohol daily at home.    ID was also consulted given his confusion and concern for possible underlying infectious etiology.  He was recommended for undergoing full back MRI as well as brain MRI and LP.  Imaging studies were negative for any pathology including brain MRI.  LP was also obtained on 08/08/2020 which had normal opening pressure and negative for signs of infection.  Further studies still in process.  He continued to exhibit intermittent episodes of confusion during hospitalization. Studies were reviewed by neurology on 08/09/2020.  Decision was made to start patient on IVIG as well as ganciclovir in conjunction with discussion with ID.  He was also placed on high-dose folic acid and thiamine supplementations.  Interval History:  Mentation remains improved. Mood seems slightly depressed some vs  just a flat affect at times.  Anxiety more controlled in general. Biggest concern now he has is his LE weakness and if it will improve. He's starting to realize how much he was drinking at home too now that he's been feeling better.   ROS: Constitutional: negative for chills and fevers, Respiratory: negative for cough, Cardiovascular: negative for chest pain and Gastrointestinal: negative for abdominal pain  Assessment & Plan: * Acute metabolic encephalopathy - patient symptoms include delirium, hallucinations - etiology considered due to metabolic possibly from infectious source however still may be due to etoh use as well - follow up B1 level, 141 nmol/L (range 66 - 200 nmol/L) - MRIs are negative as well as prelim results from LP; still other tests pending - treated with 5 days IVIG ( 3/2 - 3/6) for empiric GBS tx - also on ganciclovir (started 3/2), and high-dose thiamine/folic acid and monitor mentation response (seems to have been slowly improving the past couple days) - CSF CMV fluid is negative; ID discontinuing ganciclovir on 3/7  Alcohol abuse - evening of 3/1 appeared to have further withdrawal symptoms and was given Ativan under CIWA protocol. - completed etoh withdrawal while in hospital; CIWA discontinued -Continue folate, thiamine, multivitamin -Patient states he does not wish to go back to drinking  Lower extremity weakness - appears to be slowly improving past few days but still notable - PT/OT recommending CIR - etiology still u/k; possibly severe deconditioning from his excessive alcohol use versus infectious etiology from underlying uncontrolled HIV (followed by neurology and ID) - patient started on IVIG (cannot rule out GBS at this time)  per neurology; 5 day course (3/2 - 3/6) -Continue with PT while hospitalized as patient is able - MRI C/TL spine unremarkable to explain weakness; MRI brain also unremarkable  - continue folic acid  Folic acid deficiency - level  3.4 - has been on oral folate but also having diarrhea on admission - given some concern for possible malabsorption as well as concern for myelopathy/persistent LE weakness will initiate on high dose course of IV folic acid followed by oral again (discussed with neurology as well)  HIV (human immunodeficiency virus infection) (HCC) - Non compliant to meds. CD4 absolute count is 40, HIV RNA 104,000  - ID following -Biktarvy being started per ID -Bactrim also started for PJP ppx  Diarrhea -Negative GI pathogen panel -As he has continued to have withdrawal symptoms, diarrhea may be related to ongoing alcohol withdrawal vs ART vs (could also be seen in CMV, but testing finally back and negative)  Normocytic anemia - downtrend from 2021 (prior 13-15 g/dL), now downtrending further -Iron stores adequate. -Folate low, 3.4 -Vitamin B12 726  Hyponatremia -Consider due to diuresis prior to admission -Has been improving  Hypomagnesemia -Likely from GI losses with diarrhea -Replete and recheck as needed  Hypokalemia - repleting as needed  Essential hypertension, benign -Continue amlodipine and Coreg  GAD (generalized anxiety disorder) -Continue Zoloft  Elevated troponin-resolved as of 08/09/2020 Pt denies any chest pain or sob.  ? From severe hypokalemia , anemia and demand ischemia.  ekg shows sinus tachycardia with t wave inversions in the lateral leads. Echocardiogram no significant abnormalities.  CT angio of the chest negative for PE.  Cardiology consulted for elevated troponins, recommended no further work-up.  Old records reviewed in assessment of this patient  Antimicrobials: Biktarvy 08/09/2020 >> current IVIG 08/09/2020 >> 08/13/20 Ganciclovir 08/09/2020 >> 08/14/20 Bactrim 08/09/2020 >> current   DVT prophylaxis: SCDs Start: 08/03/20 2229   Code Status:   Code Status: Full Code Family Communication: mother   Disposition Plan: Status is: Inpatient  Remains inpatient  appropriate because:Ongoing diagnostic testing needed not appropriate for outpatient work up, IV treatments appropriate due to intensity of illness or inability to take PO and Inpatient level of care appropriate due to severity of illness   Dispo: The patient is from: Home              Anticipated d/c is to: possibly CIR              Patient currently is not medically stable to d/c.   Difficult to place patient No Risk of unplanned readmission score: Unplanned Admission- Pilot do not use: 12.42   Objective: Blood pressure (!) 139/98, pulse (!) 103, temperature 98.9 F (37.2 C), temperature source Oral, resp. rate 17, height 5\' 10"  (1.778 m), weight 82.8 kg, SpO2 100 %.  Examination: General appearance: more awake/alert; sitting up in bed dressed now and in NAD Head: Normocephalic, without obvious abnormality, atraumatic Eyes: EOMI Lungs: clear to auscultation bilaterally Heart: regular rate and rhythm and S1, S2 normal Abdomen: normal findings: bowel sounds normal and soft, non-tender Extremities: no edema Skin: mobility and turgor normal Neurologic: slowed mentation but has been slowly improving; 5/5 LUE/RUE strength. B/L LE strength 4/5 (improved from 2/5, still question his effort at times); sensation intact subjectively but at times he'll endorse LLE paresthesia Psych: slightly flat affect and depressed mood  Consultants:   ID  IR  Neurology   Procedures:   LP, 3/1  Data Reviewed: I have personally reviewed following labs  and imaging studies Results for orders placed or performed during the hospital encounter of 08/03/20 (from the past 24 hour(s))  CBC with Differential/Platelet     Status: Abnormal   Collection Time: 08/14/20  3:54 AM  Result Value Ref Range   WBC 5.9 4.0 - 10.5 K/uL   RBC 2.68 (L) 4.22 - 5.81 MIL/uL   Hemoglobin 8.7 (L) 13.0 - 17.0 g/dL   HCT 75.1 (L) 02.5 - 85.2 %   MCV 91.8 80.0 - 100.0 fL   MCH 32.5 26.0 - 34.0 pg   MCHC 35.4 30.0 - 36.0 g/dL    RDW 77.8 (H) 24.2 - 15.5 %   Platelets 322 150 - 400 K/uL   nRBC 0.0 0.0 - 0.2 %   Neutrophils Relative % 48 %   Neutro Abs 2.9 1.7 - 7.7 K/uL   Lymphocytes Relative 34 %   Lymphs Abs 2.0 0.7 - 4.0 K/uL   Monocytes Relative 12 %   Monocytes Absolute 0.7 0.1 - 1.0 K/uL   Eosinophils Relative 5 %   Eosinophils Absolute 0.3 0.0 - 0.5 K/uL   Basophils Relative 0 %   Basophils Absolute 0.0 0.0 - 0.1 K/uL   Immature Granulocytes 1 %   Abs Immature Granulocytes 0.05 0.00 - 0.07 K/uL  Magnesium     Status: Abnormal   Collection Time: 08/14/20  3:54 AM  Result Value Ref Range   Magnesium 1.5 (L) 1.7 - 2.4 mg/dL  Comprehensive metabolic panel     Status: Abnormal   Collection Time: 08/14/20  3:54 AM  Result Value Ref Range   Sodium 132 (L) 135 - 145 mmol/L   Potassium 4.4 3.5 - 5.1 mmol/L   Chloride 102 98 - 111 mmol/L   CO2 22 22 - 32 mmol/L   Glucose, Bld 91 70 - 99 mg/dL   BUN 6 6 - 20 mg/dL   Creatinine, Ser 3.53 0.61 - 1.24 mg/dL   Calcium 8.6 (L) 8.9 - 10.3 mg/dL   Total Protein 8.2 (H) 6.5 - 8.1 g/dL   Albumin 2.4 (L) 3.5 - 5.0 g/dL   AST 33 15 - 41 U/L   ALT 23 0 - 44 U/L   Alkaline Phosphatase 75 38 - 126 U/L   Total Bilirubin 0.6 0.3 - 1.2 mg/dL   GFR, Estimated >61 >44 mL/min   Anion gap 8 5 - 15    Recent Results (from the past 240 hour(s))  Gastrointestinal Panel by PCR , Stool     Status: None   Collection Time: 08/06/20  4:59 PM   Specimen: Stool  Result Value Ref Range Status   Campylobacter species NOT DETECTED NOT DETECTED Final   Plesimonas shigelloides NOT DETECTED NOT DETECTED Final   Salmonella species NOT DETECTED NOT DETECTED Final   Yersinia enterocolitica NOT DETECTED NOT DETECTED Final   Vibrio species NOT DETECTED NOT DETECTED Final   Vibrio cholerae NOT DETECTED NOT DETECTED Final   Enteroaggregative E coli (EAEC) NOT DETECTED NOT DETECTED Final   Enteropathogenic E coli (EPEC) NOT DETECTED NOT DETECTED Final   Enterotoxigenic E coli (ETEC)  NOT DETECTED NOT DETECTED Final   Shiga like toxin producing E coli (STEC) NOT DETECTED NOT DETECTED Final   Shigella/Enteroinvasive E coli (EIEC) NOT DETECTED NOT DETECTED Final   Cryptosporidium NOT DETECTED NOT DETECTED Final   Cyclospora cayetanensis NOT DETECTED NOT DETECTED Final   Entamoeba histolytica NOT DETECTED NOT DETECTED Final   Giardia lamblia NOT DETECTED NOT DETECTED Final  Adenovirus F40/41 NOT DETECTED NOT DETECTED Final   Astrovirus NOT DETECTED NOT DETECTED Final   Norovirus GI/GII NOT DETECTED NOT DETECTED Final   Rotavirus A NOT DETECTED NOT DETECTED Final   Sapovirus (I, II, IV, and V) NOT DETECTED NOT DETECTED Final    Comment: Performed at [facility], [address] [place], [place] [address]  Culture, fungus without smear     Status: None (Preliminary result)   Collection Time: 08/07/20  1:28 PM   Specimen: CSF; Cerebrospinal Fluid  Result Value Ref Range Status   Specimen Description CSF  Final   Special Requests Immunocompromised  Final   Culture   Final    NO FUNGUS ISOLATED AFTER 6 DAYS Performed at [facility], [address] [place], [place] [address]    Report Status PENDING  Incomplete  CSF culture     Status: None   Collection Time: 08/07/20  1:28 PM   Specimen: CSF; Cerebrospinal Fluid  Result Value Ref Range Status   Specimen Description CSF  Final   Special Requests Immunocompromised  Final   Gram Stain NO WBC SEEN NO ORGANISMS SEEN   Final   Culture   Final    NO GROWTH 3 DAYS Performed at [facility], [address], [place], [place] [address]    Report Status 08/12/2020 FINAL  Final  MRSA PCR Screening     Status: None   Collection Time: 08/08/20  5:34 AM   Specimen: Nasopharyngeal  Result Value Ref Range Status   MRSA by PCR NEGATIVE NEGATIVE Final    Comment:        The GeneXpert MRSA Assay (FDA approved for NASAL specimens only), is one component of a comprehensive MRSA colonization surveillance  program. It is not intended to diagnose MRSA infection nor to guide or monitor treatment for MRSA infections. Performed at [facility], [address], [place], [place] [address]   Georgana Curio, PCR     Status: None   Collection Time: 08/09/20  8:28 AM   Specimen: Blood  Result Value Ref Range Status   Toxoplasma Gondii, PCR Negative Negative Final    Comment: (NOTE) No Toxoplasma gondii DNA detected. This test was developed and its performance characteristics determined by [employer]. It has not been cleared or approved by the U.S. Food and Drug Administration. The FDA has determined that such clearance or approval is not necessary. This test is used for clinical purposes. It should not be regarded as investigational or research. Performed At: [name] [address] [place], [place] [id number] Jolene Schimke MD MV:[id number]      Radiology Studies: No results found. MR BRAIN W WO CONTRAST  Final Result    MR CERVICAL SPINE W WO CONTRAST  Final Result    MR Lumbar Spine W Wo Contrast  Final Result    MR THORACIC SPINE W WO CONTRAST  Final Result    DG Abd 1 View  Final Result    CT ANGIO CHEST PE W OR WO CONTRAST  Final Result    DG Chest Port 1 View  Final Result      Scheduled Meds: . amLODipine  10 mg Oral Daily  . bictegravir-emtricitabine-tenofovir AF  1 tablet Oral Daily  . carvedilol  12.5 mg Oral BID WC  . folic acid  1 mg Oral Daily  . gabapentin  400 mg Oral TID  . Gerhardt's butt cream   Topical TID  . magnesium  oxide  800 mg Oral Daily  . multivitamin with minerals  1 tablet Oral Daily  . pantoprazole  40 mg Oral Daily  . sertraline  100 mg Oral Daily  . sulfamethoxazole-trimethoprim  1 tablet Oral Daily  . thiamine  100 mg Oral Daily   PRN Meds: lip balm, loperamide, LORazepam, ondansetron **OR** ondansetron (ZOFRAN) IV Continuous Infusions:    LOS: 11 days  Time spent: Greater than 50% of the 35  minute visit was spent in counseling/coordination of care for the patient as laid out in the A&P.   Lewie Chamber, MD [facility] 08/14/2020, 2:10 PM

## 2020-08-14 NOTE — Progress Notes (Addendum)
Subjective: Feels he is improving. No acute overnight events.  Spoke with patient about medication compliance and alcohol cessation. Patient verbalized understanding of education and plans to abstain from alcohol and manage medications appropriately in the future.  Exam: Vitals:   08/14/20 0200 08/14/20 0230  BP: (!) 145/98 (!) 155/92  Pulse: (!) 104 (!) 106  Resp: 17 17  Temp: 98.9 F (37.2 C) 98.6 F (37 C)  SpO2: 100% 100%   Gen: In bed, NAD Resp: non-labored breathing, regular respirations Abd: soft, non-distended, non-tender  Neuro: MS: Patient is awake, alert, and oriented to person, place, month, year, age, and situation. Patient is able to provide a clear and coherent history of present illness. Speech is without aphasia or dysarthria. No neglect noted.  CN: EOMI, tracks examiner, no ptosis noted, hearing intact to voice, phonation normal. Face is symmetric resting and smiling.  Motor: 5/5 upper extremities, 4-/5 at the hip bilaterally, 4/5 knee extension/flexion. Unable to stand independently at this time. Able to sit on the side of the bed without assistance.    Sensory: Choreoathetotic movements, more notable with eyes closed.  Essentially absent proprioception at the bilateral fingers DTR: 1+ hypoactive in bilateral patellae, absent in achilles.   Pertinent Labs: Folate 3.4 Thiamine pending CSF WBC 1 RBC 1 Protein 22 Glucose 50 CSF vdrl - negative CSF EBV-negative CSF VZV PCR -negative CSF HSV-negative CSF VDRL-negative CSF JC virus-negative CSF toxoplasma-negative CSF CMV PCR- negative CSF VZV IgG-negative  CSF Mycobacterium TB-pending  HIV RNA quant 104,000, CD4 40 Peripheral CMV +  B12 726  Unresulted Labs (From admission, onward)          Start     Ordered   08/08/20 1715  Miscellaneous [facility] test (send-out)  Once,   R       Question:  Test name / description:  Answer:  mycobacterium TB by PCR   08/08/20 1715   08/07/20 4098   Miscellaneous [facility] test (send-out)  Once,   R       Comments: Cytomegalovirus (CMV), Qualitative, PCR TEST: 119147   Question:  Test name / description:  Answer:  Cytomegalovirus (CMV), Qualitative, PCR   08/07/20 0645   08/07/20 0500  Vitamin B1  Tomorrow morning,   R       Question:  Specimen collection method  Answer:  Lab=Lab collect   08/06/20 1700   08/03/20 2152  Vitamin B1  Once,   R        08/03/20 2152   Unscheduled  Occult blood card to lab, stool  As needed,   R      08/04/20 1701         HIV RNA quant 104,000, CD4 40 Peripheral CMV +  B12 726  Imaging: MRI imaging of the complete neuraxis is negative with and without contrast, no evidence of nerve root enhancement  Impression: 30 year old male with acute neuropathic process by exam as well as some delirium in the setting of AIDS.  Possible etiologies include Guillain-Barr syndrome (though I would expect elevated protein at this point), CMV polyradiculopathy (though I would expect pleocytosis on LP), folate deficiency neuropathy (though this is typically more sensory), thiamine deficiency (which could explain both confusion and neuropathy), alcohol-induced neuropathy  Folate neuropathy has the unusual distinction of preserving biceps reflexes, but as stated above it is usually more of a sensory neuropathy.  Given that I do not think I can completely exclude Guillain-Barr syndrome, IVIG was started.  He does appear to be  having some mild improvement and is subjectively having improvement.  CMV polyradiculopathy is less likely given no pleocytosis, but with concern for the possible severity that the syndrome presents, I favored starting treatment as we await his CSF CMV.  I discussed this with infectious disease and they have started ganciclovir.   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  Overall, I favor a multifactorial  process, a combination of all of the above.   Recommendations: - ganciclovir started on 3/2, will follow up pending results with [facility] today - IVIG total 2 g/kg dose, 3/2-3/6, completed  - continue vitamin repletion, alcohol cessation and medication compliance counseling  - Discussed with patient that gabapentin may be tried again for his neuropathic pain (burning pain).  Alternatively topical lidocaine followed by topical capsaicin cream can be useful in situations.  At this time he is not having significant pain and it is fairly intermittent so we will hold off on treating for now. - Outpatient neurology follow-up for neuropathy - neurology will follow peripherally for CSF CMV result.  No further neurological work-up indicated at this time.  Please reach out to neurology if new concerns arise   Brooke Dare MD-PhD [facility] [phone]    If 7pm- 7am, please page neurology on call as listed in AMION.

## 2020-08-15 ENCOUNTER — Other Ambulatory Visit: Payer: Self-pay

## 2020-08-15 ENCOUNTER — Encounter: Payer: Self-pay | Admitting: Infectious Disease

## 2020-08-15 ENCOUNTER — Ambulatory Visit: Payer: Self-pay

## 2020-08-15 ENCOUNTER — Encounter (HOSPITAL_COMMUNITY): Payer: Self-pay | Admitting: Physical Medicine & Rehabilitation

## 2020-08-15 ENCOUNTER — Inpatient Hospital Stay (HOSPITAL_COMMUNITY)
Admission: RE | Admit: 2020-08-15 | Discharge: 2020-08-31 | DRG: 977 | Disposition: A | Discharge status: Home / Self Care | Payer: Medicaid Other | Source: Intra-hospital | Attending: Physical Medicine & Rehabilitation | Admitting: Physical Medicine & Rehabilitation

## 2020-08-15 ENCOUNTER — Telehealth: Payer: Self-pay | Admitting: Internal Medicine

## 2020-08-15 DIAGNOSIS — R5381 Other malaise: Secondary | ICD-10-CM | POA: Diagnosis present

## 2020-08-15 DIAGNOSIS — M21371 Foot drop, right foot: Secondary | ICD-10-CM | POA: Diagnosis present

## 2020-08-15 DIAGNOSIS — R202 Paresthesia of skin: Secondary | ICD-10-CM

## 2020-08-15 DIAGNOSIS — R531 Weakness: Secondary | ICD-10-CM

## 2020-08-15 DIAGNOSIS — R000 Tachycardia, unspecified: Secondary | ICD-10-CM

## 2020-08-15 DIAGNOSIS — D72819 Decreased white blood cell count, unspecified: Secondary | ICD-10-CM

## 2020-08-15 DIAGNOSIS — R197 Diarrhea, unspecified: Secondary | ICD-10-CM

## 2020-08-15 DIAGNOSIS — Z79899 Other long term (current) drug therapy: Secondary | ICD-10-CM | POA: Diagnosis not present

## 2020-08-15 DIAGNOSIS — F17290 Nicotine dependence, other tobacco product, uncomplicated: Secondary | ICD-10-CM | POA: Diagnosis present

## 2020-08-15 DIAGNOSIS — E519 Thiamine deficiency, unspecified: Secondary | ICD-10-CM | POA: Diagnosis present

## 2020-08-15 DIAGNOSIS — R260 Ataxic gait: Secondary | ICD-10-CM | POA: Diagnosis present

## 2020-08-15 DIAGNOSIS — F32A Depression, unspecified: Secondary | ICD-10-CM | POA: Diagnosis present

## 2020-08-15 DIAGNOSIS — F1010 Alcohol abuse, uncomplicated: Secondary | ICD-10-CM | POA: Diagnosis present

## 2020-08-15 DIAGNOSIS — M21372 Foot drop, left foot: Secondary | ICD-10-CM | POA: Diagnosis present

## 2020-08-15 DIAGNOSIS — E538 Deficiency of other specified B group vitamins: Secondary | ICD-10-CM

## 2020-08-15 DIAGNOSIS — R7401 Elevation of levels of liver transaminase levels: Secondary | ICD-10-CM

## 2020-08-15 DIAGNOSIS — F411 Generalized anxiety disorder: Secondary | ICD-10-CM | POA: Diagnosis present

## 2020-08-15 DIAGNOSIS — I10 Essential (primary) hypertension: Secondary | ICD-10-CM

## 2020-08-15 DIAGNOSIS — K219 Gastro-esophageal reflux disease without esophagitis: Secondary | ICD-10-CM

## 2020-08-15 DIAGNOSIS — I119 Hypertensive heart disease without heart failure: Secondary | ICD-10-CM | POA: Diagnosis present

## 2020-08-15 DIAGNOSIS — E871 Hypo-osmolality and hyponatremia: Secondary | ICD-10-CM

## 2020-08-15 DIAGNOSIS — R778 Other specified abnormalities of plasma proteins: Secondary | ICD-10-CM

## 2020-08-15 DIAGNOSIS — B20 Human immunodeficiency virus [HIV] disease: Secondary | ICD-10-CM

## 2020-08-15 DIAGNOSIS — G610 Guillain-Barre syndrome: Secondary | ICD-10-CM | POA: Diagnosis present

## 2020-08-15 DIAGNOSIS — D649 Anemia, unspecified: Secondary | ICD-10-CM

## 2020-08-15 LAB — MAGNESIUM: Magnesium: 1.4 mg/dL — ABNORMAL LOW (ref 1.7–2.4)

## 2020-08-15 LAB — BASIC METABOLIC PANEL
Anion gap: 8 (ref 5–15)
BUN: 6 mg/dL (ref 6–20)
CO2: 22 mmol/L (ref 22–32)
Calcium: 9 mg/dL (ref 8.9–10.3)
Chloride: 102 mmol/L (ref 98–111)
Creatinine, Ser: 0.84 mg/dL (ref 0.61–1.24)
GFR, Estimated: >60 mL/min (ref >60)
Glucose, Bld: 88 mg/dL (ref 70–99)
Potassium: 4.4 mmol/L (ref 3.5–5.1)
Sodium: 132 mmol/L — ABNORMAL LOW (ref 135–145)

## 2020-08-15 LAB — CBC WITH DIFFERENTIAL/PLATELET
Abs Immature Granulocytes: 0.05 10*3/uL (ref 0.00–0.07)
Basophils Absolute: 0 10*3/uL (ref 0.0–0.1)
Basophils Relative: 0 %
Eosinophils Absolute: 0.2 10*3/uL (ref 0.0–0.5)
Eosinophils Relative: 4 %
HCT: 24.8 % — ABNORMAL LOW (ref 39.0–52.0)
Hemoglobin: 8.5 g/dL — ABNORMAL LOW (ref 13.0–17.0)
Immature Granulocytes: 1 %
Lymphocytes Relative: 41 %
Lymphs Abs: 2.2 10*3/uL (ref 0.7–4.0)
MCH: 31.8 pg (ref 26.0–34.0)
MCHC: 34.3 g/dL (ref 30.0–36.0)
MCV: 92.9 fL (ref 80.0–100.0)
Monocytes Absolute: 0.5 10*3/uL (ref 0.1–1.0)
Monocytes Relative: 10 %
Neutro Abs: 2.4 10*3/uL (ref 1.7–7.7)
Neutrophils Relative %: 44 %
Platelets: 339 10*3/uL (ref 150–400)
RBC: 2.67 MIL/uL — ABNORMAL LOW (ref 4.22–5.81)
RDW: 18.3 % — ABNORMAL HIGH (ref 11.5–15.5)
WBC: 5.4 10*3/uL (ref 4.0–10.5)
nRBC: 0 % (ref 0.0–0.2)

## 2020-08-15 LAB — VITAMIN B1: Vitamin B1 (Thiamine): 31.9 nmol/L — ABNORMAL LOW (ref 66.5–200.0)

## 2020-08-15 MED ORDER — ONDANSETRON HCL 4 MG/2ML IJ SOLN: 4.0000 mg | Freq: Four times a day (QID) | INTRAMUSCULAR | Status: DC | PRN

## 2020-08-15 MED ORDER — DIPHENHYDRAMINE HCL 12.5 MG/5ML PO ELIX
12.5000 mg | ORAL_SOLUTION | Freq: Four times a day (QID) | ORAL | Status: DC | PRN
  Filled 2020-08-15: qty 10

## 2020-08-15 MED ORDER — PROCHLORPERAZINE EDISYLATE 10 MG/2ML IJ SOLN: 5.0000 mg | Freq: Four times a day (QID) | INTRAMUSCULAR | Status: DC | PRN

## 2020-08-15 MED ORDER — MAGNESIUM OXIDE 400 (241.3 MG) MG PO TABS
800.0000 mg | ORAL_TABLET | Freq: Every day | ORAL | Status: DC
  Administered 2020-08-16: 800 mg via ORAL
  Filled 2020-08-15: qty 2

## 2020-08-15 MED ORDER — ADULT MULTIVITAMIN W/MINERALS CH
1.0000 | ORAL_TABLET | Freq: Every day | ORAL | Status: DC
  Administered 2020-08-16 – 2020-08-24 (×9): 1 via ORAL
  Filled 2020-08-15 (×9): qty 1

## 2020-08-15 MED ORDER — MAGNESIUM SULFATE 2 GM/50ML IV SOLN
2.0000 g | Freq: Once | INTRAVENOUS | Status: AC
  Administered 2020-08-15: 2 g via INTRAVENOUS
  Filled 2020-08-15: qty 50

## 2020-08-15 MED ORDER — MAGNESIUM OXIDE 400 (241.3 MG) MG PO TABS: 800.0000 mg | ORAL_TABLET | Freq: Every day | ORAL | Status: DC

## 2020-08-15 MED ORDER — BICTEGRAVIR-EMTRICITAB-TENOFOV 50-200-25 MG PO TABS: 1.0000 | ORAL_TABLET | Freq: Every day | ORAL | Status: DC

## 2020-08-15 MED ORDER — BISACODYL 10 MG RE SUPP: 10.0000 mg | Freq: Every day | RECTAL | Status: DC | PRN

## 2020-08-15 MED ORDER — ENSURE ENLIVE PO LIQD
237.0000 mL | Freq: Two times a day (BID) | ORAL | Status: DC
  Administered 2020-08-16 – 2020-08-18 (×5): 237 mL via ORAL

## 2020-08-15 MED ORDER — LOPERAMIDE HCL 2 MG PO CAPS: 2.0000 mg | ORAL_CAPSULE | ORAL | 0 refills | Status: DC | PRN

## 2020-08-15 MED ORDER — FUROSEMIDE 20 MG PO TABS: 20.0000 mg | ORAL_TABLET | Freq: Every day | ORAL | Status: DC | PRN

## 2020-08-15 MED ORDER — ALUM & MAG HYDROXIDE-SIMETH 200-200-20 MG/5ML PO SUSP: 30.0000 mL | ORAL | Status: DC | PRN

## 2020-08-15 MED ORDER — PROCHLORPERAZINE MALEATE 5 MG PO TABS: 5.0000 mg | ORAL_TABLET | Freq: Four times a day (QID) | ORAL | Status: DC | PRN

## 2020-08-15 MED ORDER — LIP MEDEX EX OINT
1.0000 "application " | TOPICAL_OINTMENT | CUTANEOUS | Status: DC | PRN
  Administered 2020-08-15: 1 via TOPICAL
  Filled 2020-08-15: qty 7

## 2020-08-15 MED ORDER — GERHARDT'S BUTT CREAM: 1.0000 "application " | TOPICAL_CREAM | Freq: Three times a day (TID) | CUTANEOUS | Status: DC

## 2020-08-15 MED ORDER — SERTRALINE HCL 100 MG PO TABS
100.0000 mg | ORAL_TABLET | Freq: Every day | ORAL | Status: DC
  Administered 2020-08-16 – 2020-08-31 (×16): 100 mg via ORAL
  Filled 2020-08-15 (×16): qty 1

## 2020-08-15 MED ORDER — AMLODIPINE BESYLATE 10 MG PO TABS: 10.0000 mg | ORAL_TABLET | Freq: Every day | ORAL | Status: DC

## 2020-08-15 MED ORDER — THIAMINE HCL 100 MG PO TABS
100.0000 mg | ORAL_TABLET | Freq: Every day | ORAL | Status: DC
  Administered 2020-08-16 – 2020-08-31 (×16): 100 mg via ORAL
  Filled 2020-08-15 (×16): qty 1

## 2020-08-15 MED ORDER — POLYETHYLENE GLYCOL 3350 17 G PO PACK: 17.0000 g | PACK | Freq: Every day | ORAL | Status: DC | PRN

## 2020-08-15 MED ORDER — SULFAMETHOXAZOLE-TRIMETHOPRIM 800-160 MG PO TABS: 1.0000 | ORAL_TABLET | Freq: Every day | ORAL | Status: DC

## 2020-08-15 MED ORDER — NICOTINE POLACRILEX 2 MG MT GUM
2.0000 mg | CHEWING_GUM | OROMUCOSAL | Status: DC | PRN
  Administered 2020-08-15 – 2020-08-31 (×35): 2 mg via ORAL
  Filled 2020-08-15 (×42): qty 1

## 2020-08-15 MED ORDER — FOLIC ACID 1 MG PO TABS: 1.0000 mg | ORAL_TABLET | Freq: Every day | ORAL | Status: DC

## 2020-08-15 MED ORDER — LOPERAMIDE HCL 2 MG PO CAPS: 2.0000 mg | ORAL_CAPSULE | ORAL | Status: DC | PRN

## 2020-08-15 MED ORDER — ONDANSETRON HCL 4 MG PO TABS: 4.0000 mg | ORAL_TABLET | Freq: Four times a day (QID) | ORAL | Status: DC | PRN

## 2020-08-15 MED ORDER — FLEET ENEMA 7-19 GM/118ML RE ENEM: 1.0000 | ENEMA | Freq: Once | RECTAL | Status: DC | PRN

## 2020-08-15 MED ORDER — CARVEDILOL 12.5 MG PO TABS
12.5000 mg | ORAL_TABLET | Freq: Two times a day (BID) | ORAL | Status: DC
  Administered 2020-08-16 – 2020-08-31 (×31): 12.5 mg via ORAL
  Filled 2020-08-15 (×31): qty 1

## 2020-08-15 MED ORDER — CARBAMIDE PEROXIDE 6.5 % OT SOLN
5.0000 [drp] | Freq: Two times a day (BID) | OTIC | Status: AC
  Administered 2020-08-15 – 2020-08-17 (×4): 5 [drp] via OTIC
  Filled 2020-08-15: qty 15

## 2020-08-15 MED ORDER — PANTOPRAZOLE SODIUM 40 MG PO TBEC
40.0000 mg | DELAYED_RELEASE_TABLET | Freq: Every day | ORAL | Status: DC
  Administered 2020-08-16 – 2020-08-28 (×13): 40 mg via ORAL
  Filled 2020-08-15 (×13): qty 1

## 2020-08-15 MED ORDER — FOLIC ACID 1 MG PO TABS
1.0000 mg | ORAL_TABLET | Freq: Every day | ORAL | Status: DC
  Administered 2020-08-16 – 2020-08-31 (×16): 1 mg via ORAL
  Filled 2020-08-15 (×16): qty 1

## 2020-08-15 MED ORDER — LORAZEPAM 0.5 MG PO TABS: 0.2500 mg | ORAL_TABLET | Freq: Two times a day (BID) | ORAL | Status: DC | PRN

## 2020-08-15 MED ORDER — PANTOPRAZOLE SODIUM 40 MG PO TBEC: 40.0000 mg | DELAYED_RELEASE_TABLET | Freq: Every day | ORAL | Status: DC

## 2020-08-15 MED ORDER — ENOXAPARIN SODIUM 40 MG/0.4ML ~~LOC~~ SOLN
40.0000 mg | SUBCUTANEOUS | Status: DC
  Administered 2020-08-15 – 2020-08-23 (×9): 40 mg via SUBCUTANEOUS
  Filled 2020-08-15 (×9): qty 0.4

## 2020-08-15 MED ORDER — GABAPENTIN 400 MG PO CAPS: 400.0000 mg | ORAL_CAPSULE | Freq: Three times a day (TID) | ORAL | Status: DC

## 2020-08-15 MED ORDER — GERHARDT'S BUTT CREAM
1.0000 "application " | TOPICAL_CREAM | Freq: Three times a day (TID) | CUTANEOUS | Status: DC
  Administered 2020-08-15 – 2020-08-21 (×11): 1 via TOPICAL
  Filled 2020-08-15: qty 1

## 2020-08-15 MED ORDER — AMLODIPINE BESYLATE 10 MG PO TABS
10.0000 mg | ORAL_TABLET | Freq: Every day | ORAL | Status: DC
  Administered 2020-08-16 – 2020-08-31 (×16): 10 mg via ORAL
  Filled 2020-08-15 (×16): qty 1

## 2020-08-15 MED ORDER — LORAZEPAM 0.5 MG PO TABS: 0.2500 mg | ORAL_TABLET | Freq: Two times a day (BID) | ORAL | 0 refills | Status: DC | PRN

## 2020-08-15 MED ORDER — SULFAMETHOXAZOLE-TRIMETHOPRIM 800-160 MG PO TABS
1.0000 | ORAL_TABLET | Freq: Every day | ORAL | Status: DC
  Administered 2020-08-16 – 2020-08-31 (×16): 1 via ORAL
  Filled 2020-08-15 (×16): qty 1

## 2020-08-15 MED ORDER — TRAZODONE HCL 50 MG PO TABS
25.0000 mg | ORAL_TABLET | Freq: Every evening | ORAL | Status: DC | PRN
  Filled 2020-08-15: qty 1

## 2020-08-15 MED ORDER — BICTEGRAVIR-EMTRICITAB-TENOFOV 50-200-25 MG PO TABS
1.0000 | ORAL_TABLET | Freq: Every day | ORAL | Status: DC
  Administered 2020-08-16 – 2020-08-31 (×16): 1 via ORAL
  Filled 2020-08-15 (×16): qty 1

## 2020-08-15 MED ORDER — PROCHLORPERAZINE 25 MG RE SUPP: 12.5000 mg | Freq: Four times a day (QID) | RECTAL | Status: DC | PRN

## 2020-08-15 MED ORDER — GUAIFENESIN-DM 100-10 MG/5ML PO SYRP: 5.0000 mL | ORAL_SOLUTION | Freq: Four times a day (QID) | ORAL | Status: DC | PRN

## 2020-08-15 MED ORDER — ACETAMINOPHEN 325 MG PO TABS: 325.0000 mg | ORAL_TABLET | ORAL | Status: DC | PRN

## 2020-08-15 MED ORDER — GABAPENTIN 400 MG PO CAPS
400.0000 mg | ORAL_CAPSULE | Freq: Three times a day (TID) | ORAL | Status: DC
  Administered 2020-08-15 – 2020-08-31 (×47): 400 mg via ORAL
  Filled 2020-08-15 (×47): qty 1

## 2020-08-15 MED ORDER — THIAMINE HCL 100 MG PO TABS: 100.0000 mg | ORAL_TABLET | Freq: Every day | ORAL | Status: DC

## 2020-08-15 NOTE — PMR Pre-admission (Signed)
PMR Admission Coordinator Pre-Admission Assessment  Patient: Harold Flores is an 30 y.o., male MRN: [id number] DOB: 02/15/1991 Height: _0  (177.8 cm) Weight: 82.8 kg  Insurance Information HMO:     PPO:      PCP:      IPA:      80/20:      OTHER:  PRIMARY: Uninsured    Development worker, community:       Phone#:   The "Data Collection Information Summary" for patients in Inpatient Rehabilitation Facilities with attached "Privacy Act [place] Records" was provided and verbally reviewed with: N/A  Emergency Contact Information Contact Information    Name Relation Home Work Mobile   [name] Spouse   [name] [phone]     [name] [phone]        Current Medical History  Patient Admitting Diagnosis: progressive weakness, debility   History of Present Illness: Pt is a 30 y/o male with PMH of HIV/AIDS, non-compliant with medications due to cost, HTN, ETOH abuse anxiety, and GERD who recently was seen as an outpatient for LE edema of unclear etiology.  He was prescribed lasix but LE weakness progressed.  Pt presented to [facility] on 2/24 with progressive weakness and confusion, and was transferred to [facility] for further workup.  MRIs of full spine were negative, as was lumbar puncture.  Pt was started on empiric IVIG which concluded on 3/6 after 5 doses.  Pt was started on ganciclovir per ID discretion and was discontinued on 3/7.  Pt also placed on high dose folic acid and thiamine supplementations.  Pt slowly improving.  Therapy evaluations were completed and pt was recommended for CIR.     Patient's medical record from [facility] has been reviewed by the rehabilitation admission coordinator and physician.  Past Medical History  Past Medical History:  Diagnosis Date  . Anal fissure   . Anal fistula   . Anal ulcer   . Asthma   . Epistaxis   . GAD (generalized anxiety disorder)   . GERD (gastroesophageal reflux  disease)   . Hemorrhoid   . HIV (human immunodeficiency virus infection) ([place])   . Hypertension   . Syphilis   . Varicella     Family History   family history includes Diabetes in his maternal grandmother and mother; Heart disease in his paternal grandfather; Hyperlipidemia in his mother.  Prior Rehab/Hospitalizations Has the patient had prior rehab or hospitalizations prior to admission? No  Has the patient had major surgery during 100 days prior to admission? No   Current Medications  Current Facility-Administered Medications:  .  amLODipine (NORVASC) tablet 10 mg, 10 mg, Oral, Daily, Chotiner, Yevonne Aline, MD, 10 mg at 08/15/20 0855 .  bictegravir-emtricitabine-tenofovir AF (BIKTARVY) 50-200-25 MG per tablet 1 tablet, 1 tablet, Oral, Daily, Tommy Medal, Lavell Islam, MD, 1 tablet at 08/15/20 [phone] .  carbamide peroxide (DEBROX) 6.5 % OTIC (EAR) solution 5 drop, 5 drop, Both EARS, BID, Dwyane Dee, MD, 5 drop at 08/15/20 0858 .  carvedilol (COREG) tablet 12.5 mg, 12.5 mg, Oral, BID WC, Dwyane Dee, MD, 12.5 mg at 08/15/20 0855 .  [COMPLETED] folic acid 5 mg in sodium chloride 0.9 % 50 mL IVPB, 5 mg, Intravenous, Daily, Last Rate: 102 mL/hr at 08/13/20 1223, 5 mg at 08/13/20 1223 **FOLLOWED BY** folic acid (FOLVITE) tablet 1 mg, 1 mg, Oral, Daily, Dwyane Dee, MD, 1 mg at 08/15/20 0856 .  gabapentin (NEURONTIN) capsule 400 mg, 400 mg,  Oral, TID, Dwyane Dee, MD, 400 mg at 08/15/20 0856 .  Gerhardt's butt cream, , Topical, TID, Hosie Poisson, MD, Given at 08/14/20 2002 .  lip balm (CARMEX) ointment, , Topical, PRN, Hosie Poisson, MD, Given at 08/07/20 1024 .  loperamide (IMODIUM) capsule 2 mg, 2 mg, Oral, PRN, Hosie Poisson, MD, 2 mg at 08/10/20 0903 .  LORazepam (ATIVAN) tablet 0.25 mg, 0.25 mg, Oral, BID PRN, Blount, Xenia T, NP, 0.25 mg at 08/13/20 2147 .  magnesium oxide (MAG-OX) tablet 800 mg, 800 mg, Oral, Daily, Dwyane Dee, MD, 800 mg at 08/15/20 0856 .  magnesium sulfate  IVPB 2 g 50 mL, 2 g, Intravenous, Once, Pokhrel, Laxman, MD, Last Rate: 50 mL/hr at 08/15/20 1048, 2 g at 08/15/20 1048 .  multivitamin with minerals tablet 1 tablet, 1 tablet, Oral, Daily, Hosie Poisson, MD, 1 tablet at 08/15/20 0857 .  ondansetron (ZOFRAN) tablet 4 mg, 4 mg, Oral, Q6H PRN **OR** ondansetron (ZOFRAN) injection 4 mg, 4 mg, Intravenous, Q6H PRN, Hosie Poisson, MD .  pantoprazole (PROTONIX) EC tablet 40 mg, 40 mg, Oral, Daily, Dwyane Dee, MD, 40 mg at 08/15/20 0857 .  sertraline (ZOLOFT) tablet 100 mg, 100 mg, Oral, Daily, Hosie Poisson, MD, 100 mg at 08/15/20 0857 .  sulfamethoxazole-trimethoprim (BACTRIM DS) 800-160 MG per tablet 1 tablet, 1 tablet, Oral, Daily, Tommy Medal, Lavell Islam, MD, 1 tablet at 08/15/20 0857 .  thiamine tablet 100 mg, 100 mg, Oral, Daily, Greta Doom, MD, 100 mg at 08/15/20 5038  Patients Current Diet:  Diet Order            Diet - low sodium heart healthy           Diet Heart Room service appropriate? Yes with Assist; Fluid consistency: Thin  Diet effective now                 Precautions / Restrictions Precautions Precautions: Fall Precaution Comments: B buckling noted with gt training. Restrictions Weight Bearing Restrictions: No   Has the patient had 2 or more falls or a fall with injury in the past year? Yes  Prior Activity Level Limited Community (1-2x/wk): was not going out very much, per review unable to ambulate for about 1 month prior to admission, was not driving  Prior Functional Level Self Care: Did the patient need help bathing, dressing, using the toilet or eating? Independent  Indoor Mobility: Did the patient need assistance with walking from room to room (with or without device)? Independent  Stairs: Did the patient need assistance with internal or external stairs (with or without device)? Independent  Functional Cognition: Did the patient need help planning regular tasks such as shopping or remembering to  take medications? Independent  Home Assistive Devices / Equipment Home Assistive Devices/Equipment: None Home Equipment: Walker - 4 wheels,Cane - single point  Prior Device Use: Indicate devices/aids used by the patient prior to current illness, exacerbation or injury? immediately prior to admission was using a rollator as a w/c  Current Functional Level Cognition  Overall Cognitive Status: Within Functional Limits for tasks assessed Orientation Level: Oriented X4 Following Commands: Follows one step commands with increased time Safety/Judgement: Decreased awareness of deficits,Decreased awareness of safety General Comments: After placing patient in chair, NT informed therapist that it was not safe to leave in chair. Asked patient, "Is it safe to get into bed by yourself?". He said "yes I can probably do it by myself."    Extremity Assessment (includes Sensation/Coordination)  Upper Extremity  a w/c °  °Current Functional Level °Cognition °  Overall Cognitive Status: Within Functional Limits for tasks assessed °Orientation Level: Oriented X4 °Following Commands: Follows one step commands with increased time °Safety/Judgement: Decreased awareness of deficits,Decreased awareness of safety °General Comments: After placing patient in chair, NT informed therapist that it was not safe to leave in chair. Asked patient, "Is it safe to get into bed by yourself?". He said "yes I can probably do it by myself." °   °Extremity Assessment °(includes Sensation/Coordination) °  Upper Extremity Assessment: RUE deficits/detail,LUE deficits/detail °RUE Deficits / Details: 3+/5 shoulder strength, 4+/5 bicep, 4/5 tricep, 3+/5 wrist, 3+/5 grip °RUE Sensation: decreased light touch (in hand) °RUE Coordination: WNL °LUE Deficits / Details: 4/5 shoulder strength, 4+/5 bicep, 4/5 tricep, 3+/5 wrist, 3+/5 grip °LUE Sensation: decreased light touch (in hand) °LUE Coordination: WNL  °Lower Extremity Assessment: Defer to PT evaluation °RLE Deficits / Details: LT impaired distal Leg, deep pressure sensed but not specific to location, more sensation proximally, Dorsiflexion 0/5 , knee extension 2/5, hip flex 2/5, Abd . 2/5 °LLE Deficits / Details: very similar to the right, decreased senastion, 0 dorsiflexion  °   °ADLs °  Overall ADL's : Needs assistance/impaired °Eating/Feeding: Set up °Grooming: Set up °Upper Body Bathing: Set up,Cueing for sequencing,Sitting °Lower Body Bathing: Moderate  assistance,Sitting/lateral leans,Set up °Upper Body Dressing : Minimal assistance,Sitting °Upper Body Dressing Details (indicate cue type and reason): unable to tie due ataxia °Lower Body Dressing: Total assistance,Bed level °Toilet Transfer Details (indicate cue type and reason): unable to transfer off of bed °Toileting- Clothing Manipulation and Hygiene: Total assistance,Sit to/from stand,+2 for physical assistance,+2 for safety/equipment °General ADL Comments: brief bouts of grooming and UB ADL's seated EOB increased time with observed swaying with decreased trunk strength and control.  °   °Mobility °  Overal bed mobility: Needs Assistance °Bed Mobility: Rolling,Sidelying to Sit °Rolling: Supervision °Sidelying to sit: Supervision °Supine to sit: Min guard °Sit to supine: Min assist (increased time, reliance on rails and cues for sequencing, increased A for LE's required) °Sit to sidelying: Supervision °General bed mobility comments: Min guard to move to edge of bed with increased time and effort.  °   °Transfers °  Overall transfer level: Needs assistance °Equipment used: Rolling walker (2 wheeled) °Transfer via Lift Equipment: Stedy °Transfers: Sit to/from Stand °Sit to Stand: Mod assist ° Lateral/Scoot Transfers: Mod assist °General transfer comment: Cues for hand placement to rise into standing.  Heavy mod assistance to boost into standing.  °   °Ambulation / Gait / Stairs / Wheelchair Mobility °  Ambulation/Gait °Ambulation/Gait assistance: Mod assist °Gait Distance (Feet): 80 Feet °Assistive device: Rolling walker (2 wheeled) °Gait Pattern/deviations: Step-through pattern,Step-to pattern,Ataxic,Narrow base of support,Scissoring,Decreased stride length,Decreased dorsiflexion - right,Decreased dorsiflexion - left °General Gait Details: Pt presents with B foot slap and B intermittent buckling in stance phase. Close chair follow for safety.  LOB noted x 2.  Pt required cues to increase BOS.  Increased gt  distance this session. Posterior bias pulling RW causing wheels to rise assistance to ground RW for support.  °   °Posture / Balance Dynamic Sitting Balance °Sitting balance - Comments: no LOB with neuro assessment of LEs without UE support °Balance °Overall balance assessment: Needs assistance °Sitting-balance support: Feet supported,Bilateral upper extremity supported °Sitting balance-Leahy Scale: Good °Sitting balance - Comments: no LOB with neuro assessment of LEs without UE support °Standing balance support: Bilateral upper extremity supported °Standing balance-Leahy Scale: Poor °Standing balance comment:   Yes How Accessible: Accessible via walker Does the patient have any problems obtaining your medications?: Yes (Describe) (uninsured)  Social/Family/Support Systems Patient Roles: Spouse Contact Information: Gwyndolyn Saxon [phone] Anticipated Caregiver: mom Plumer Mittelstaedt) and spouse Gwyndolyn Saxon) Anticipated Caregiver's Contact Information: Mickel Baas [phone] Ability/Limitations of Caregiver: supervision only Caregiver Availability: 24/7 Discharge Plan Discussed with Primary Caregiver: Yes Is Caregiver In Agreement with Plan?: Yes Does Caregiver/Family have Issues with Lodging/Transportation while Pt is in Rehab?: No  Goals Patient/Family Goal for  Rehab: PT/OT sueprvision to min assist, SLP mod I Expected length of stay: 12-14 days Pt/Family Agrees to Admission and willing to participate: Yes Program Orientation Provided & Reviewed with Pt/Caregiver Including Roles  & Responsibilities: Yes  Barriers to Discharge: Insurance for SNF coverage  Decrease burden of Care through IP rehab admission: n/a  Possible need for SNF placement upon discharge: Not anticipated. Pt with good progress with therapies and has support from his husband and his mom.   Patient Condition: I have reviewed medical records from [facility], spoken with CM, and patient. I met with patient at the bedside for inpatient rehabilitation assessment.  Patient will benefit from ongoing PT and OT, can actively participate in 3 hours of therapy a day 5 days of the week, and can make measurable gains during the admission.  Patient will also benefit from the coordinated team approach during an Inpatient Acute Rehabilitation admission.  The patient will receive intensive therapy as well as Rehabilitation physician, nursing, social worker, and care management interventions.  Due to bowel management, safety, skin/wound care, disease management, medication administration, pain management and patient education the patient requires 24 hour a day rehabilitation nursing.  The patient is currently mod assist with mobility and basic ADLs.  Discharge setting and therapy post discharge at home with home health is anticipated.  Patient has agreed to participate in the Acute Inpatient Rehabilitation Program and will admit today.  Preadmission Screen Completed By:  Michel Santee, PT, DPT 08/15/2020 11:47 AM ______________________________________________________________________   Discussed status with Dr. Dagoberto Ligas on 08/15/20  at 11:59 AM  and received approval for admission today.  Admission Coordinator:  Michel Santee, PT, DPT time 11:59 AM Sudie Grumbling 08/15/20     Assessment/Plan: Diagnosis: 1. Does the need for close, 24 hr/day Medical supervision in concert with the patient's rehab needs make it unreasonable for this patient to be served in a less intensive setting? Yes 2. Co-Morbidities requiring supervision/potential complications: HIV/AIDs, Guillain Barre syndrome, LE weakness, encephalopathy 3. Due to bladder management, bowel management, safety, skin/wound care, disease management, medication administration, pain management and patient education, does the patient require 24 hr/day rehab nursing? Yes 4. Does the patient require coordinated care of a physician, rehab nurse, PT, OT, and SLP to address physical and functional deficits in the context of the above medical diagnosis(es)? Yes Addressing deficits in the following areas: balance, endurance, locomotion, strength, transferring, bathing, dressing, feeding, grooming and toileting 5. Can the patient actively participate in an intensive therapy program of at least 3 hrs of therapy 5 days a week? Yes 6. The potential for patient to make measurable gains while on inpatient rehab is good 7. Anticipated functional outcomes upon discharge from inpatient rehab: supervision and min assist PT, supervision and min assist OT, modified independent SLP 8. Estimated rehab length of stay to reach the above functional goals is: 12-14 days 9. Anticipated discharge destination: Home 10. Overall Rehab/Functional Prognosis: good   MD Signature:

## 2020-08-15 NOTE — H&P (Signed)
Physical Medicine and Rehabilitation Admission H&P    Chief Complaint  Patient presents with  . Functional decline due to progressive neuropathy with weakness and debility    HPI:  Harold Flores is a 30 year old male with history of ETOH abuse, syphilis, HIV/AIDS (did not follow up with ID due to GAD), BLE edema (started on lasix 2/10 as refused to go to ED) who was admitted on 08/06/20 with BLE weakness with difficulty walking, paraesthesia, decreased appetite with loose stools, worsening of confusion with hallucinations. He was found to have significant hypokalemia with K-2.0 and elevated troponin felt to be multifactorial due to LVH, tachycardia and anemia. Dr. Iver Nestle consulted for input and expressed concerns of opportunistic infection and full work up initiated. MRI brain and spine done and was negative for acute abnormality or abnormal cord signal.  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  Folate low -3.4 and Thiamine low-31.9.   He was treated with IVIG due to suspicion of GBS, high doses of IV B1 and folic acid due to concerns of folic acid/thiamine deficiency causing encephalopathy/confusion as well as IV ganciclovir for empiric CMV polyradiculopathy. 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  LP negative for JC virus, EGV, VSZ, VDRL, toxoplasma and CMV--mycobacterium TB  pending.  He has had some improvement in BLE strength but continues to have BLE instability, ataxic gait with tendency to loose balance posteriorly as well as waxing and waning of mentation. CIR recommended due to functional decline.     Pt reports he thinks his cognition is now at baseline.  Having pins and needles in hands and feet- Gabapentin is a good medicine and is helping- doesn't think he needs an increased dose.  Knees also ache some.  Thinks IVIG made him do a  lot better.  LBM 2 BMs this AM- voiding well- no issues.   Review of Systems  Constitutional: Negative for chills and fever.  HENT: Negative for hearing loss and tinnitus.   Eyes: Negative for blurred vision and double vision.  Respiratory: Negative for cough and shortness of breath.   Cardiovascular: Negative for chest pain, palpitations and leg swelling.  Gastrointestinal: Negative for diarrhea, heartburn and nausea.  Genitourinary: Negative for dysuria and urgency.  Musculoskeletal: Negative for back pain and myalgias.  Skin: Negative for itching and rash.  Neurological: Positive for sensory change (pins and needles with burning--got worse 2-3 weeks PTA. ).  Psychiatric/Behavioral: Negative for hallucinations. The patient does not have insomnia.   All other systems reviewed and are negative.    Past Medical History:  Diagnosis Date  . Anal fissure   . Anal fistula   . Anal ulcer   . Asthma   . Epistaxis   . GAD (generalized anxiety disorder)   . GERD (gastroesophageal reflux disease)   . Hemorrhoid   . HIV (human immunodeficiency virus infection) (HCC)   . Hypertension   . Syphilis   . Varicella     Past Surgical History:  Procedure Laterality Date  . EYE SURGERY  1994   blephroplasty right eye  . RADIOLOGY WITH ANESTHESIA N/A 08/08/2020   Procedure: MRI WITH ANESTHESIA- BRAIN WITH AND WITHOUT CONTRAST,  CERVICAL SPINE WITH AND WITHOUT CONTRAST, LUMBAR SPINE WITH WITHOUT CONTRAST, THORACIC SPINE WITH WITHOUT CONTRAST;  Surgeon: Radiologist, Medication, MD;  Location: [facility] OR;  Service: Radiology;  Laterality: N/A;  Family History  Problem Relation Age of Onset  . Diabetes Mother   . Hyperlipidemia Mother   . Diabetes Maternal Grandmother   . Heart disease Paternal Grandfather   . Cancer Neg Hx     Social History:  Married but separated. Lives with disabled mother. He was working as a Educational psychologist at [employer]. He  reports that he has been smoking e-cigarettes and  cigarettes about a PPD for past 4-5 years. He has never used smokeless tobacco. He reports current alcohol use of about 4 boot leggers per day. He reports current drug use. Drug: Marijuana.    Allergies: No Known Allergies    Medications Prior to Admission  Medication Sig Dispense Refill  . [START ON 08/16/2020] amLODipine (NORVASC) 10 MG tablet Take 1 tablet (10 mg total) by mouth daily.    . bictegravir-emtricitabine-tenofovir AF (BIKTARVY) 50-200-25 MG TABS tablet Take 1 tablet by mouth daily.    . carvedilol (COREG) 12.5 MG tablet 1 tab by mouth twice per day 180 tablet 0  . [START ON 08/16/2020] folic acid (FOLVITE) 1 MG tablet Take 1 tablet (1 mg total) by mouth daily.    . furosemide (LASIX) 20 MG tablet Take 1 tablet (20 mg total) by mouth daily as needed for fluid or edema. 1 tab by mouth in the AM, and 1 in the PM for persistent swelling    . gabapentin (NEURONTIN) 400 MG capsule Take 1 capsule (400 mg total) by mouth 3 (three) times daily.    Marland Kitchen loperamide (IMODIUM) 2 MG capsule Take 1 capsule (2 mg total) by mouth as needed for diarrhea or loose stools.  0  . LORazepam (ATIVAN) 0.5 MG tablet Take 0.5 tablets (0.25 mg total) by mouth 2 (two) times daily as needed for anxiety.  0  . [START ON 08/16/2020] magnesium oxide (MAG-OX) 400 (241.3 Mg) MG tablet Take 2 tablets (800 mg total) by mouth daily.    . Multiple Vitamin (MULTIVITAMIN) tablet Take 1 tablet by mouth daily.    Marland Kitchen Nystatin (GERHARDT'S BUTT CREAM) CREA Apply 1 application topically 3 (three) times daily.    Melene Muller ON 08/16/2020] pantoprazole (PROTONIX) 40 MG tablet Take 1 tablet (40 mg total) by mouth daily.    . sertraline (ZOLOFT) 100 MG tablet Take 1 tablet (100 mg total) by mouth daily. 90 tablet 1  . [START ON 08/16/2020] sulfamethoxazole-trimethoprim (BACTRIM DS) 800-160 MG tablet Take 1 tablet by mouth daily.    Melene Muller ON 08/16/2020] thiamine 100 MG tablet Take 1 tablet (100 mg total) by mouth daily.      Drug Regimen  Review  Drug regimen was reviewed and remains appropriate with no significant issues identified  Home:     Functional History:    Functional Status:  Mobility:          ADL:    Cognition: Cognition Orientation Level: Oriented X4     Blood pressure 123/83, pulse 87, temperature 98.6 F (37 C), temperature source Oral, resp. rate 17, height  (1.778 m), weight 77.9 kg, SpO2 100 %. Physical Exam Vitals and nursing note reviewed.  Constitutional:      Appearance: Normal appearance.     Comments: Pt sitting up in bed finishing lunch, appropriate, interactive, NAD  HENT:     Head: Normocephalic.     Comments: Smile equal- tongue midline    Nose: Nose normal. No congestion.     Mouth/Throat:     Mouth: Mucous membranes are moist.  Pharynx: Oropharynx is clear. No oropharyngeal exudate.  Eyes:     General:        Right eye: No discharge.        Left eye: No discharge.     Extraocular Movements: Extraocular movements intact.  Cardiovascular:     Comments: RRR- no JVD Pulmonary:     Comments: CTA B/L- no W/R/R- good air movement Abdominal:     Comments: Soft, NT, ND, (+)BS normoactive  Musculoskeletal:     Cervical back: Normal range of motion and neck supple.     Comments: UEs 4+/5 in biceps, triceps, WE, and grip and 4/5 in finger abd B/L RLE- HF 3-/5, KE 3-/5, DF 0/5 and PF 2-/5 LLE- HF 2+/5, KE 2+/5, DF 0/5, and PF 2-/5  Skin:    Comments: R forearm IV- looks OK No skin breakdown seen  Neurological:     Mental Status: He is alert.     Comments: Speech clear. Appropriate and interactive.  BLE weakness with ataxia LLE>RLE.  Bilateral foot drop.   Appropriate Ox3 Decreased sensation from knees on down B/L to toes Intact sensation in thighs and UEs B/L No spasticity seen  Psychiatric:        Mood and Affect: Mood normal.        Behavior: Behavior normal.     Results for orders placed or performed during the hospital encounter of 08/03/20 (from  the past 48 hour(s))  CBC with Differential/Platelet     Status: Abnormal   Collection Time: 08/14/20  3:54 AM  Result Value Ref Range   WBC 5.9 4.0 - 10.5 K/uL   RBC 2.68 (L) 4.22 - 5.81 MIL/uL   Hemoglobin 8.7 (L) 13.0 - 17.0 g/dL   HCT 16.124.6 (L) 09.639.0 - 04.552.0 %   MCV 91.8 80.0 - 100.0 fL   MCH 32.5 26.0 - 34.0 pg   MCHC 35.4 30.0 - 36.0 g/dL   RDW 40.918.2 (H) 81.111.5 - 91.415.5 %   Platelets 322 150 - 400 K/uL   nRBC 0.0 0.0 - 0.2 %   Neutrophils Relative % 48 %   Neutro Abs 2.9 1.7 - 7.7 K/uL   Lymphocytes Relative 34 %   Lymphs Abs 2.0 0.7 - 4.0 K/uL   Monocytes Relative 12 %   Monocytes Absolute 0.7 0.1 - 1.0 K/uL   Eosinophils Relative 5 %   Eosinophils Absolute 0.3 0.0 - 0.5 K/uL   Basophils Relative 0 %   Basophils Absolute 0.0 0.0 - 0.1 K/uL   Immature Granulocytes 1 %   Abs Immature Granulocytes 0.05 0.00 - 0.07 K/uL    Comment: Performed at [facility], [address] [place], [place] [address]  Magnesium     Status: Abnormal   Collection Time: 08/14/20  3:54 AM  Result Value Ref Range   Magnesium 1.5 (L) 1.7 - 2.4 mg/dL    Comment: Performed at [facility], [address] [place], [place] [address]  Comprehensive metabolic panel     Status: Abnormal   Collection Time: 08/14/20  3:54 AM  Result Value Ref Range   Sodium 132 (L) 135 - 145 mmol/L   Potassium 4.4 3.5 - 5.1 mmol/L   Chloride 102 98 - 111 mmol/L   CO2 22 22 - 32 mmol/L   Glucose, Bld 91 70 - 99 mg/dL    Comment: Glucose reference range applies only to samples taken after fasting for at least 8 hours.   BUN 6 6 -  20 mg/dL   Creatinine, Ser 3.73 0.61 - 1.24 mg/dL   Calcium 8.6 (L) 8.9 - 10.3 mg/dL   Total Protein 8.2 (H) 6.5 - 8.1 g/dL   Albumin 2.4 (L) 3.5 - 5.0 g/dL   AST 33 15 - 41 U/L   ALT 23 0 - 44 U/L   Alkaline Phosphatase 75 38 - 126 U/L   Total Bilirubin 0.6 0.3 - 1.2 mg/dL   GFR, Estimated >42 >87 mL/min    Comment: (NOTE) Calculated using the CKD-EPI Creatinine Equation (2021)     Anion gap 8 5 - 15    Comment: Performed at [facility], [address] [place], [place] [address]  Basic metabolic panel     Status: Abnormal   Collection Time: 08/15/20  2:35 AM  Result Value Ref Range   Sodium 132 (L) 135 - 145 mmol/L   Potassium 4.4 3.5 - 5.1 mmol/L   Chloride 102 98 - 111 mmol/L   CO2 22 22 - 32 mmol/L   Glucose, Bld 88 70 - 99 mg/dL    Comment: Glucose reference range applies only to samples taken after fasting for at least 8 hours.   BUN 6 6 - 20 mg/dL   Creatinine, Ser 7.26 0.61 - 1.24 mg/dL   Calcium 9.0 8.9 - 20.3 mg/dL   GFR, Estimated >55 >97 mL/min    Comment: (NOTE) Calculated using the CKD-EPI Creatinine Equation (2021)    Anion gap 8 5 - 15    Comment: Performed at [facility], [address] [place], [place] [address]  CBC with Differential/Platelet     Status: Abnormal   Collection Time: 08/15/20  2:35 AM  Result Value Ref Range   WBC 5.4 4.0 - 10.5 K/uL   RBC 2.67 (L) 4.22 - 5.81 MIL/uL   Hemoglobin 8.5 (L) 13.0 - 17.0 g/dL   HCT 45.3 (L) 64.6 - 80.3 %   MCV 92.9 80.0 - 100.0 fL   MCH 31.8 26.0 - 34.0 pg   MCHC 34.3 30.0 - 36.0 g/dL   RDW 21.2 (H) 24.8 - 25.0 %   Platelets 339 150 - 400 K/uL   nRBC 0.0 0.0 - 0.2 %   Neutrophils Relative % 44 %   Neutro Abs 2.4 1.7 - 7.7 K/uL   Lymphocytes Relative 41 %   Lymphs Abs 2.2 0.7 - 4.0 K/uL   Monocytes Relative 10 %   Monocytes Absolute 0.5 0.1 - 1.0 K/uL   Eosinophils Relative 4 %   Eosinophils Absolute 0.2 0.0 - 0.5 K/uL   Basophils Relative 0 %   Basophils Absolute 0.0 0.0 - 0.1 K/uL   Immature Granulocytes 1 %   Abs Immature Granulocytes 0.05 0.00 - 0.07 K/uL    Comment: Performed at [facility], [address], [place], [place] [address]  Magnesium     Status: Abnormal   Collection Time: 08/15/20  2:35 AM  Result Value Ref Range   Magnesium 1.4 (L) 1.7 - 2.4 mg/dL    Comment: Performed at [facility], [address], [place], [place] [address]   No  results found.     Medical Problem List and Plan: 1.  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  Antithrombotics: -DVT/anticoagulation:  Pharmaceutical: Lovenox  -antiplatelet therapy: N/A 3. Pain Management: Gabapentin tid 4. Mood: Team to provide ego support.   -antipsychotic agents: N/A 5. Neuropsych: This patient is not fully capable of making decisions on his own behalf. 6. Skin/Wound Care: Routine pressure relief measures  7. Fluids/Electrolytes/Nutrition: Monitor I/O. Check lytes in am. 8. HTN: Monitor BP tid--continue Norvasc and coreg bid. . 9. HIV/AIDS: Now on Bitarvy (has been approved for assistance) with Bactrim daily for prophylaxis.  --Imodium prn for diarrhea 10. ETOH abuse with folate/thiamine deficiency:  Completed IV doses of folic acid/Thiamine   --On folic acid ane thiamine for supplement.   -pt insists cannot drink again- I agreed 11. Hypomagnesemia: Remains low despite supplement 1.4-->2.0-->1.4  --received 2 gram IV doses--03/04, 03/06 and 03/8.   --Now on daily po supplement. Will monitor for now.   12. GAD/depression: On Zoloft 100 mg bid with low dose ativan bid prn.  13. B/L foot drops- suggest prevalon boots at night- doesn't want PRAFOs- thought they might be too heavy; also needs AFO consults for B/L foot drop.  14 Encephalopathy- sounds like resolved- not sure needs SLP.  PamelaS Love- PA-C 08/15/20   I have personally performed a face to face diagnostic evaluation of this patient and formulated the key components of the plan.  Additionally, I have personally reviewed laboratory data, imaging studies, as well as relevant notes and concur with the physician assistant's documentation above.   The patient's status has not changed from  the original H&P.  Any changes in documentation from the acute care chart have been noted above.         Genice Rouge, MD 08/15/2020

## 2020-08-15 NOTE — Progress Notes (Signed)
Inpatient Rehabilitation Medication Review by a Pharmacist  A complete drug regimen review was completed for this patient to identify any potential clinically significant medication issues.  Clinically significant medication issues were identified:  Yes  Type of Medication Issue Identified Description of Issue Urgent (address now) Non-Urgent (address on AM team rounds) Plan Plan Accepted by Provider? (Yes / No / Pending AM Rounds)  Drug Interaction(s) (clinically significant)       Duplicate Therapy       Allergy       No Medication Administration End Date       Incorrect Dose       Additional Drug Therapy Needed       Other  [place] discharge med list included Lasix PRN fluid/edema; pt did not receive this med at [facility], and it was not ordered on admission to CIR Non urgent Pending review by CIR team on AM rounds Pending AM rounds    Name of provider notified for urgent issues identified:  N/A  For non-urgent medication issues to be resolved on team rounds tomorrow morning a [facility] Secure Chat Handoff was sent to:  N/A (med review completed after 5 PM)  Time spent performing this drug regimen review (minutes):  20  Vicki Mallet, PharmD, BCPS, [facility] Clinical Pharmacist 08/15/2020 5:58 PM

## 2020-08-15 NOTE — Progress Notes (Signed)
PMR Admission Coordinator Pre-Admission Assessment   Patient: Harold Flores is an 30 y.o., male MRN: [id number] DOB: [birth year] Height: 5' 10" (177.8 cm) Weight: 82.8 kg   Insurance Information HMO:     PPO:      PCP:      IPA:      80/20:      OTHER:  PRIMARY: Uninsured     Development worker, community:       Phone#:    The Actuary for patients in Inpatient Rehabilitation Facilities with attached Privacy Act [place] Records was provided and verbally reviewed with: N/A   Emergency Contact Information         Contact Information     Name Relation Home Work Mobile    [name] Spouse     Candler. Father [phone]        [facility] Mother [phone]             Current Medical History  Patient Admitting Diagnosis: progressive weakness, debility    History of Present Illness: Pt is a 30 y/o male with PMH of HIV/AIDS, non-compliant with medications due to cost, HTN, ETOH abuse anxiety, and GERD who recently was seen as an outpatient for LE edema of unclear etiology.  He was prescribed lasix but LE weakness progressed.  Pt presented to [facility] on 2/24 with progressive weakness and confusion, and was transferred to [facility] for further workup.  MRIs of full spine were negative, as was lumbar puncture.  Pt was started on empiric IVIG which concluded on 3/6 after 5 doses.  Pt was started on ganciclovir per ID discretion and was discontinued on 3/7.  Pt also placed on high dose folic acid and thiamine supplementations.  Pt slowly improving.  Therapy evaluations were completed and pt was recommended for CIR.    Patient's medical record from [facility] has been reviewed by the rehabilitation admission coordinator and physician.   Past Medical History      Past Medical History:  Diagnosis Date   Anal fissure     Anal fistula     Anal ulcer     Asthma     Epistaxis     GAD (generalized anxiety  disorder)     GERD (gastroesophageal reflux disease)     Hemorrhoid     HIV (human immunodeficiency virus infection) ([place])     Hypertension     Syphilis     Varicella        Family History   family history includes Diabetes in his maternal grandmother and mother; Heart disease in his paternal grandfather; Hyperlipidemia in his mother.   Prior Rehab/Hospitalizations Has the patient had prior rehab or hospitalizations prior to admission? No   Has the patient had major surgery during 100 days prior to admission? No               Current Medications   Current Facility-Administered Medications:    amLODipine (NORVASC) tablet 10 mg, 10 mg, Oral, Daily, Chotiner, Yevonne Aline, MD, 10 mg at 08/15/20 0855   bictegravir-emtricitabine-tenofovir AF (BIKTARVY) 50-200-25 MG per tablet 1 tablet, 1 tablet, Oral, Daily, Tommy Medal, Lavell Islam, MD, 1 tablet at 08/15/20 0855   carbamide peroxide (DEBROX) 6.5 % OTIC (EAR) solution 5 drop, 5 drop, Both EARS, BID, Girguis, David, MD, 5 drop at 08/15/20 0858   carvedilol (COREG) tablet 12.5 mg, 12.5 mg, Oral, BID WC, Dwyane Dee, MD, 12.5 mg at 08/15/20 0814   [  COMPLETED] folic acid 5 mg in sodium chloride 0.9 % 50 mL IVPB, 5 mg, Intravenous, Daily, Last Rate: 102 mL/hr at 08/13/20 1223, 5 mg at 08/13/20 1223 **FOLLOWED BY** folic acid (FOLVITE) tablet 1 mg, 1 mg, Oral, Daily, Girguis, David, MD, 1 mg at 08/15/20 0856   gabapentin (NEURONTIN) capsule 400 mg, 400 mg, Oral, TID, Dwyane Dee, MD, 400 mg at 08/15/20 0737   Gerhardt's butt cream, , Topical, TID, Hosie Poisson, MD, Given at 08/14/20 2002   lip balm (CARMEX) ointment, , Topical, PRN, Hosie Poisson, MD, Given at 08/07/20 1024   loperamide (IMODIUM) capsule 2 mg, 2 mg, Oral, PRN, Hosie Poisson, MD, 2 mg at 08/10/20 1062   LORazepam (ATIVAN) tablet 0.25 mg, 0.25 mg, Oral, BID PRN, Lovey Newcomer T, NP, 0.25 mg at 08/13/20 2147   magnesium oxide (MAG-OX) tablet 800 mg, 800 mg, Oral,  Daily, Dwyane Dee, MD, 800 mg at 08/15/20 0856   magnesium sulfate IVPB 2 g 50 mL, 2 g, Intravenous, Once, Pokhrel, Laxman, MD, Last Rate: 50 mL/hr at 08/15/20 1048, 2 g at 08/15/20 1048   multivitamin with minerals tablet 1 tablet, 1 tablet, Oral, Daily, Hosie Poisson, MD, 1 tablet at 08/15/20 0857   ondansetron (ZOFRAN) tablet 4 mg, 4 mg, Oral, Q6H PRN **OR** ondansetron (ZOFRAN) injection 4 mg, 4 mg, Intravenous, Q6H PRN, Karleen Hampshire, Vijaya, MD   pantoprazole (PROTONIX) EC tablet 40 mg, 40 mg, Oral, Daily, Girguis, David, MD, 40 mg at 08/15/20 0857   sertraline (ZOLOFT) tablet 100 mg, 100 mg, Oral, Daily, Hosie Poisson, MD, 100 mg at 08/15/20 0857   sulfamethoxazole-trimethoprim (BACTRIM DS) 800-160 MG per tablet 1 tablet, 1 tablet, Oral, Daily, Tommy Medal, Lavell Islam, MD, 1 tablet at 08/15/20 6948   thiamine tablet 100 mg, 100 mg, Oral, Daily, Greta Doom, MD, 100 mg at 08/15/20 5462   Patients Current Diet:     Diet Order                      Diet - low sodium heart healthy              Diet Heart Room service appropriate? Yes with Assist; Fluid consistency: Thin  Diet effective now                      Precautions / Restrictions Precautions Precautions: Fall Precaution Comments: B buckling noted with gt training. Restrictions Weight Bearing Restrictions: No    Has the patient had 2 or more falls or a fall with injury in the past year? Yes   Prior Activity Level Limited Community (1-2x/wk): was not going out very much, per review unable to ambulate for about 1 month prior to admission, was not driving   Prior Functional Level Self Care: Did the patient need help bathing, dressing, using the toilet or eating? Independent   Indoor Mobility: Did the patient need assistance with walking from room to room (with or without device)? Independent   Stairs: Did the patient need assistance with internal or external stairs (with or without device)? Independent    Functional Cognition: Did the patient need help planning regular tasks such as shopping or remembering to take medications? Independent   Home Assistive Devices / Equipment Home Assistive Devices/Equipment: None Home Equipment: Walker - 4 wheels,Cane - single point   Prior Device Use: Indicate devices/aids used by the patient prior to current illness, exacerbation or injury? immediately prior to admission was using a rollator as  a w/c   Current Functional Level Cognition   Overall Cognitive Status: Within Functional Limits for tasks assessed Orientation Level: Oriented X4 Following Commands: Follows one step commands with increased time Safety/Judgement: Decreased awareness of deficits,Decreased awareness of safety General Comments: After placing patient in chair, NT informed therapist that it was not safe to leave in chair. Asked patient, "Is it safe to get into bed by yourself?". He said "yes I can probably do it by myself."    Extremity Assessment (includes Sensation/Coordination)   Upper Extremity Assessment: RUE deficits/detail,LUE deficits/detail RUE Deficits / Details: 3+/5 shoulder strength, 4+/5 bicep, 4/5 tricep, 3+/5 wrist, 3+/5 grip RUE Sensation: decreased light touch (in hand) RUE Coordination: WNL LUE Deficits / Details: 4/5 shoulder strength, 4+/5 bicep, 4/5 tricep, 3+/5 wrist, 3+/5 grip LUE Sensation: decreased light touch (in hand) LUE Coordination: WNL  Lower Extremity Assessment: Defer to PT evaluation RLE Deficits / Details: LT impaired distal Leg, deep pressure sensed but not specific to location, more sensation proximally, Dorsiflexion 0/5 , knee extension 2/5, hip flex 2/5, Abd . 2/5 LLE Deficits / Details: very similar to the right, decreased senastion, 0 dorsiflexion     ADLs   Overall ADL's : Needs assistance/impaired Eating/Feeding: Set up Grooming: Set up Upper Body Bathing: Set up,Cueing for sequencing,Sitting Lower Body Bathing: Moderate  assistance,Sitting/lateral leans,Set up Upper Body Dressing : Minimal assistance,Sitting Upper Body Dressing Details (indicate cue type and reason): unable to tie due ataxia Lower Body Dressing: Total assistance,Bed level Toilet Transfer Details (indicate cue type and reason): unable to transfer off of bed Toileting- Clothing Manipulation and Hygiene: Total assistance,Sit to/from stand,+2 for physical assistance,+2 for safety/equipment General ADL Comments: brief bouts of grooming and UB ADL's seated EOB increased time with observed swaying with decreased trunk strength and control.     Mobility   Overal bed mobility: Needs Assistance Bed Mobility: Rolling,Sidelying to Sit Rolling: Supervision Sidelying to sit: Supervision Supine to sit: Min guard Sit to supine: Min assist (increased time, reliance on rails and cues for sequencing, increased A for LE's required) Sit to sidelying: Supervision General bed mobility comments: Min guard to move to edge of bed with increased time and effort.     Transfers   Overall transfer level: Needs assistance Equipment used: Rolling walker (2 wheeled) Transfer via Lift Equipment: Stedy Transfers: Sit to/from Stand Sit to Stand: Mod assist  Lateral/Scoot Transfers: Mod assist General transfer comment: Cues for hand placement to rise into standing.  Heavy mod assistance to boost into standing.     Ambulation / Gait / Stairs / Wheelchair Mobility   Ambulation/Gait Ambulation/Gait assistance: Mod assist Gait Distance (Feet): 80 Feet Assistive device: Rolling walker (2 wheeled) Gait Pattern/deviations: Step-through pattern,Step-to pattern,Ataxic,Narrow base of support,Scissoring,Decreased stride length,Decreased dorsiflexion - right,Decreased dorsiflexion - left General Gait Details: Pt presents with B foot slap and B intermittent buckling in stance phase. Close chair follow for safety.  LOB noted x 2.  Pt required cues to increase BOS.  Increased gt  distance this session. Posterior bias pulling RW causing wheels to rise assistance to ground RW for support.     Posture / Balance Dynamic Sitting Balance Sitting balance - Comments: no LOB with neuro assessment of LEs without UE support Balance Overall balance assessment: Needs assistance Sitting-balance support: Feet supported,Bilateral upper extremity supported Sitting balance-Leahy Scale: Good Sitting balance - Comments: no LOB with neuro assessment of LEs without UE support Standing balance support: Bilateral upper extremity supported Standing balance-Leahy Scale: Poor Standing balance comment:  Heavy reliance on BUEs and external assistance.     Special needs/care consideration n/a    Previous Home Environment (from acute therapy documentation) Living Arrangements: Spouse/significant other,Parent Available Help at Discharge: Family,Available PRN/intermittently Type of Home: [place]: One level Home Access: Ramped entrance Bathroom Shower/Tub: Chiropodist: [place]: No   Discharge Living Setting Plans for Discharge Living Setting: Patient's home,Lives with (comment) (spouse and mom) Type of Home at Discharge: Apartment Discharge Home Layout: One level Discharge Home Access: [place] entrance Discharge Bathroom Shower/Tub: [place] unit Discharge Bathroom Toilet: Standard Discharge Bathroom Accessibility: Yes How Accessible: Accessible via walker Does the patient have any problems obtaining your medications?: Yes (Describe) (uninsured)   Social/Family/Support Systems Patient Roles: Spouse Contact Information: Gwyndolyn Saxon [phone] Anticipated Caregiver: mom Deshane Cotroneo) and spouse Gwyndolyn Saxon) Anticipated Caregiver's Contact Information: Mickel Baas [phone] Ability/Limitations of Caregiver: supervision only Caregiver Availability: 24/7 Discharge Plan Discussed with Primary Caregiver: Yes Is Caregiver In Agreement with  Plan?: Yes Does Caregiver/Family have Issues with Lodging/Transportation while Pt is in Rehab?: No   Goals Patient/Family Goal for Rehab: PT/OT sueprvision to min assist, SLP mod I Expected length of stay: 12-14 days Pt/Family Agrees to Admission and willing to participate: Yes Program Orientation Provided & Reviewed with Pt/Caregiver Including Roles  & Responsibilities: Yes  Barriers to Discharge: Insurance for SNF coverage   Decrease burden of Care through IP rehab admission: n/a  Possible need for SNF placement upon discharge: Not anticipated. Pt with good progress with therapies and has support from his husband and his mom.    Patient Condition: I have reviewed medical records from [facility], spoken with CM, and patient. I met with patient at the bedside for inpatient rehabilitation assessment.  Patient will benefit from ongoing PT and OT, can actively participate in 3 hours of therapy a day 5 days of the week, and can make measurable gains during the admission.  Patient will also benefit from the coordinated team approach during an Inpatient Acute Rehabilitation admission.  The patient will receive intensive therapy as well as Rehabilitation physician, nursing, social worker, and care management interventions.  Due to bowel management, safety, skin/wound care, disease management, medication administration, pain management and patient education the patient requires 24 hour a day rehabilitation nursing.  The patient is currently mod assist with mobility and basic ADLs.  Discharge setting and therapy post discharge at home with home health is anticipated.  Patient has agreed to participate in the Acute Inpatient Rehabilitation Program and will admit today.   Preadmission Screen Completed By:  Michel Santee, PT, DPT 08/15/2020 11:47 AM ______________________________________________________________________   Discussed status with Dr. Dagoberto Ligas on 08/15/20  at 11:59 AM  and received approval  for admission today.   Admission Coordinator:  Michel Santee, PT, DPT time 11:59 AM Sudie Grumbling 08/15/20     Assessment/Plan: Diagnosis: 1. Does the need for close, 24 hr/day Medical supervision in concert with the patient's rehab needs make it unreasonable for this patient to be served in a less intensive setting? Yes 2. Co-Morbidities requiring supervision/potential complications: HIV/AIDs, Guillain Barre syndrome, LE weakness, encephalopathy 3. Due to bladder management, bowel management, safety, skin/wound care, disease management, medication administration, pain management and patient education, does the patient require 24 hr/day rehab nursing? Yes 4. Does the patient require coordinated care of a physician, rehab nurse, PT, OT, and SLP to address physical and functional deficits in the context of the above medical diagnosis(es)? Yes Addressing deficits in the following areas:  balance, endurance, locomotion, strength, transferring, bathing, dressing, feeding, grooming and toileting 5. Can the patient actively participate in an intensive therapy program of at least 3 hrs of therapy 5 days a week? Yes 6. The potential for patient to make measurable gains while on inpatient rehab is good 7. Anticipated functional outcomes upon discharge from inpatient rehab: supervision and min assist PT, supervision and min assist OT, modified independent SLP 8. Estimated rehab length of stay to reach the above functional goals is: 12-14 days 9. Anticipated discharge destination: Home 10. Overall Rehab/Functional Prognosis: good     MD Signature:

## 2020-08-15 NOTE — Progress Notes (Signed)
Inpatient Rehab Admissions Coordinator:   I have a bed available for this patient to admit to CIR today.  Dr. Tyson Babinski in agreement.  Will let pt/family and TOC team know.   Estill Dooms, PT, DPT Admissions Coordinator [phone] 08/15/20  11:26 AM

## 2020-08-15 NOTE — Progress Notes (Signed)
Report given to 4W staff. Per staff nurse   Room [phone] is not ready yet and will infiorm 5N , once the room is ready for transfer.

## 2020-08-15 NOTE — Progress Notes (Signed)
Occupational Therapy Treatment Patient Details Name: Harold Flores MRN: [id number] DOB: 28-Jan-1991 Today's Date: 08/15/2020    History of present illness 30 year old gentleman prior history of HIV, noncompliant to medications, essential hypertension, alcohol abuse, generalized anxiety disorder, GERD recently had lower extremity edema of unclear etiology and was started on  Lasix, Admitted 08/03/20 with progressive weakness , significantly low potassium of 2.   OT comments  Pt making good progress with functional goals. Pt sat EOB min guard A, stood from EOB to RW with mod A. Pt ambulated to bathroom min A for toileting tasks mod A; pt with uncoordinated and scissoring gait.  Pt stood sink for washing/drring hands and face min guard A with min cues for safety. Pt with impaired coordination and had difficulty with buttons when donning clean gown seated EOB.   Follow Up Recommendations  CIR;Supervision/Assistance - 24 hour    Equipment Recommendations  3 in 1 bedside commode;Wheelchair (measurements OT);Wheelchair cushion (measurements OT)    Recommendations for Other Services      Precautions / Restrictions Precautions Precautions: Fall Precaution Comments: B buckling noted with gt training. Restrictions Weight Bearing Restrictions: No       Mobility Bed Mobility Overal bed mobility: Needs Assistance       Supine to sit: Min guard Sit to supine: Min guard   General bed mobility comments: increased time and effort.    Transfers Overall transfer level: Needs assistance Equipment used: Rolling walker (2 wheeled) Transfers: Sit to/from Stand Sit to Stand: Mod assist         General transfer comment: cues for correct hand placement for sit - stand from EOB with RW and from toilet to RW. pt required having to power up each time    Balance Overall balance assessment: Needs assistance Sitting-balance support: Feet supported;Bilateral upper extremity supported Sitting  balance-Leahy Scale: Good     Standing balance support: Bilateral upper extremity supported;During functional activity Standing balance-Leahy Scale: Poor                             ADL either performed or assessed with clinical judgement   ADL Overall ADL's : Needs assistance/impaired     Grooming: Wash/dry hands;Wash/dry face;Min guard;Standing       Lower Body Bathing: Minimal assistance;Sitting/lateral leans Lower Body Bathing Details (indicate cue type and reason): simulated seated EOB Upper Body Dressing : Minimal assistance Upper Body Dressing Details (indicate cue type and reason): unabe to fastne button or tie up  gown     Toilet Transfer: Moderate assistance;Ambulation;RW;Cueing for safety;Comfort height toilet;Grab bars   Toileting- Clothing Manipulation and Hygiene: Moderate assistance;Sit to/from stand;Cueing for safety       Functional mobility during ADLs: Moderate assistance;Rolling walker;Cueing for safety       Vision Patient Visual Report: No change from baseline     Perception     Praxis      Cognition Arousal/Alertness: Awake/alert Behavior During Therapy: WFL for tasks assessed/performed;Anxious Overall Cognitive Status: Within Functional Limits for tasks assessed Area of Impairment: Safety/judgement;Problem solving                         Safety/Judgement: Decreased awareness of deficits;Decreased awareness of safety   Problem Solving: Slow processing;Requires verbal cues;Requires tactile cues;Difficulty sequencing          Exercises     Shoulder Instructions       General Comments  Pertinent Vitals/ Pain       Pain Assessment: Faces Pain Score: 3  Pain Location: B hands and feet Pain Descriptors / Indicators: Tingling;Burning Pain Intervention(s): Monitored during session;Repositioned  Home Living                                          Prior Functioning/Environment               Frequency  Min 2X/week        Progress Toward Goals  OT Goals(current goals can now be found in the care plan section)  Progress towards OT goals: Progressing toward goals  Acute Rehab OT Goals Patient Stated Goal: to get better  Plan Discharge plan remains appropriate    Co-evaluation                 AM-PAC OT "6 Clicks" Daily Activity     Outcome Measure   Help from another person eating meals?: None Help from another person taking care of personal grooming?: A Little Help from another person toileting, which includes using toliet, bedpan, or urinal?: A Lot Help from another person bathing (including washing, rinsing, drying)?: A Lot Help from another person to put on and taking off regular upper body clothing?: A Little Help from another person to put on and taking off regular lower body clothing?: Total 6 Click Score: 15    End of Session Equipment Utilized During Treatment: Gait belt;Rolling walker  OT Visit Diagnosis: Other abnormalities of gait and mobility (R26.89);History of falling (Z91.81);Muscle weakness (generalized) (M62.81);Other symptoms and signs involving the nervous system (R29.898);Other symptoms and signs involving cognitive function   Activity Tolerance Patient tolerated treatment well   Patient Left in bed;with call bell/phone within reach;with bed alarm set   Nurse Communication          Time: 1324-4010 OT Time Calculation (min): 20 min  Charges: OT General Charges $OT Visit: 1 Visit OT Treatments $Self Care/Home Management : 8-22 mins     Galen Manila 08/15/2020, 3:25 PM

## 2020-08-15 NOTE — Progress Notes (Signed)
Physical Therapy Treatment Patient Details Name: Harold Flores MRN: [id number] DOB: [birth year] Today's Date: 08/15/2020    History of Present Illness 30 year old gentleman prior history of HIV, noncompliant to medications, essential hypertension, alcohol abuse, generalized anxiety disorder, GERD recently had lower extremity edema of unclear etiology and was started on  Lasix, Admitted 08/03/20 with progressive weakness , significantly low potassium of 2.    PT Comments    Pt supine in bed on arrival.  Performed edge of bed transition with supervision.  Pt continues to benefit from skilled rehab in a post acute setting.  Pt remains to require min to mod assistance for transfers into standing and gt training.  Pt to d/c to CIR this pm.  He is very motivated for his stay.     Follow Up Recommendations  CIR     Equipment Recommendations  Wheelchair (measurements PT);Wheelchair cushion (measurements PT);Rolling walker with 5" wheels    Recommendations for Other Services       Precautions / Restrictions Precautions Precautions: Fall Precaution Comments: B buckling noted with gt training. Restrictions Weight Bearing Restrictions: No    Mobility  Bed Mobility Overal bed mobility: Needs Assistance Bed Mobility: Rolling;Sidelying to Sit Rolling: Supervision Sidelying to sit: Supervision Supine to sit: Min guard Sit to supine: Min guard   General bed mobility comments: increased time and effort.    Transfers Overall transfer level: Needs assistance Equipment used: Rolling walker (2 wheeled) Transfers: Sit to/from Stand Sit to Stand: Min assist         General transfer comment: Cues for hand placement to and from seated surface.  Ambulation/Gait Ambulation/Gait assistance: Mod assist;Min assist;+2 safety/equipment Gait Distance (Feet): 140 Feet (+ 80 ft) Assistive device: Rolling walker (2 wheeled) Gait Pattern/deviations: Step-through pattern;Ataxic;Narrow base of  support;Decreased stride length;Decreased dorsiflexion - right;Decreased dorsiflexion - left     General Gait Details: No scissoring noted this session.  Continues to buckle with B LEs.  Pt continues to require close chair follow for safety with gt training.  LOB x 3  with posterior translation.   Stairs             Wheelchair Mobility    Modified Rankin (Stroke Patients Only)       Balance Overall balance assessment: Needs assistance Sitting-balance support: Feet supported;Bilateral upper extremity supported Sitting balance-Leahy Scale: Good     Standing balance support: Bilateral upper extremity supported;During functional activity Standing balance-Leahy Scale: Poor Standing balance comment: Heavy reliance on BUEs and external assistance.                            Cognition Arousal/Alertness: Awake/alert Behavior During Therapy: WFL for tasks assessed/performed;Anxious Overall Cognitive Status: Within Functional Limits for tasks assessed Area of Impairment: Safety/judgement;Problem solving                         Safety/Judgement: Decreased awareness of deficits;Decreased awareness of safety   Problem Solving: Slow processing;Requires verbal cues;Requires tactile cues;Difficulty sequencing        Exercises      General Comments        Pertinent Vitals/Pain Pain Assessment: Faces Pain Score: 3  Pain Location: B hands and feet Pain Descriptors / Indicators: Tingling;Burning Pain Intervention(s): Monitored during session;Repositioned    Home Living                      Prior Function  PT Goals (current goals can now be found in the care plan section) Acute Rehab PT Goals Patient Stated Goal: to get better Potential to Achieve Goals: Fair Progress towards PT goals: Progressing toward goals    Frequency    Min 3X/week      PT Plan Current plan remains appropriate    Co-evaluation               AM-PAC PT "6 Clicks" Mobility   Outcome Measure  Help needed turning from your back to your side while in a flat bed without using bedrails?: A Little Help needed moving from lying on your back to sitting on the side of a flat bed without using bedrails?: A Little Help needed moving to and from a bed to a chair (including a wheelchair)?: A Lot Help needed standing up from a chair using your arms (e.g., wheelchair or bedside chair)?: A Lot Help needed to walk in hospital room?: A Lot Help needed climbing 3-5 steps with a railing? : Total 6 Click Score: 13    End of Session Equipment Utilized During Treatment: Gait belt Activity Tolerance: Patient tolerated treatment well Patient left: in chair;with call bell/phone within reach;with chair alarm set Nurse Communication: Mobility status PT Visit Diagnosis: Muscle weakness (generalized) (M62.81);Other symptoms and signs involving the nervous system (R29.898);Repeated falls (R29.6)     Time: 0962-8366 PT Time Calculation (min) (ACUTE ONLY): 17 min  Charges:  $Gait Training: 8-22 mins                     Bonney Leitz , PTA Acute Rehabilitation Services Pager [phone] Office [phone]     Aimee Artis Delay 08/15/2020, 3:33 PM

## 2020-08-15 NOTE — H&P (Shared)
Physical Medicine and Rehabilitation Admission H&P    Chief Complaint  Patient presents with  . Functional decline due to progressive neuropathy with weakness and debility    HPI:  Harold Flores is a 30 year old male with history of ETOH abuse, syphilis, HIV/AIDS (did not follow up with ID due to GAD), BLE edema (started on lasix 2/10 as refused to go to ED) who was admitted on 08/06/20 with BLE weakness with difficulty walking, paraesthesia, decreased appetite with loose stools, worsening of confusion with hallucinations. He was found to have significant hypokalemia with K-2.0 and elevated troponin felt to be multifactorial due to LVH, tachycardia and anemia. Dr. Iver Nestle consulted for input and expressed concerns of opportunistic infection and full work up initiated. MRI brain and spine done and was negative for acute abnormality or abnormal cord signal.  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  Folate low -3.4 and Thiamine low-31.9.   He was treated with IVIG due to suspicion of GBS, high doses of IV B1 and folic acid due to concerns of folic acid/thiamine deficiency causing encephalopathy/confusion as well as IV ganciclovir for empiric CMV polyradiculopathy. 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  LP negative for JC virus, EGV, VSZ, VDRL, toxoplasma and CMV--mycobacterium TB  pending.  He has had some improvement in BLE strength but continues to have BLE instability, ataxic gait with tendency to loose balance posteriorly as well as waxing and waning of mentation. CIR recommended due to functional decline.     Review of Systems  Constitutional: Negative for chills and fever.  HENT: Negative for hearing loss and tinnitus.   Eyes: Negative for blurred vision and double vision.  Respiratory: Negative for cough and shortness of breath.    Cardiovascular: Negative for chest pain, palpitations and leg swelling.  Gastrointestinal: Negative for diarrhea, heartburn and nausea.  Genitourinary: Negative for dysuria and urgency.  Musculoskeletal: Negative for back pain and myalgias.  Skin: Negative for itching and rash.  Neurological: Positive for sensory change (pins and needles with burning--got worse 2-3 weeks PTA. ).  Psychiatric/Behavioral: Negative for hallucinations. The patient does not have insomnia.      Past Medical History:  Diagnosis Date  . Anal fissure   . Anal fistula   . Anal ulcer   . Asthma   . Epistaxis   . GAD (generalized anxiety disorder)   . GERD (gastroesophageal reflux disease)   . Hemorrhoid   . HIV (human immunodeficiency virus infection) (HCC)   . Hypertension   . Syphilis   . Varicella     Past Surgical History:  Procedure Laterality Date  . EYE SURGERY  1994   blephroplasty right eye  . RADIOLOGY WITH ANESTHESIA N/A 08/08/2020   Procedure: MRI WITH ANESTHESIA- BRAIN WITH AND WITHOUT CONTRAST,  CERVICAL SPINE WITH AND WITHOUT CONTRAST, LUMBAR SPINE WITH WITHOUT CONTRAST, THORACIC SPINE WITH WITHOUT CONTRAST;  Surgeon: Radiologist, Medication, MD;  Location: [facility] OR;  Service: Radiology;  Laterality: N/A;    Family History  Problem Relation Age of Onset  . Diabetes Mother   . Hyperlipidemia Mother   . Diabetes Maternal Grandmother   . Heart disease Paternal Grandfather   . Cancer Neg Hx     Social History:  Married but separated. Lives with disabled mother. He was working as a Educational psychologist at [employer]. He  reports that he has  been smoking e-cigarettes and cigarettes about a PPD for past 4-5 years. He has never used smokeless tobacco. He reports current alcohol use of about 4 boot leggers per day. He reports current drug use. Drug: Marijuana.    Allergies: No Known Allergies    Medications Prior to Admission  Medication Sig Dispense Refill  . carvedilol (COREG) 12.5 MG tablet 1 tab  by mouth twice per day 180 tablet 0  . Multiple Vitamin (MULTIVITAMIN) tablet Take 1 tablet by mouth daily.    . sertraline (ZOLOFT) 100 MG tablet Take 1 tablet (100 mg total) by mouth daily. 90 tablet 1  . [DISCONTINUED] furosemide (LASIX) 20 MG tablet 1 tab by mouth in the AM, and 1 in the PM for persistent swelling 60 tablet 1  . chlorthalidone (HYGROTON) 25 MG tablet Take 1 tablet (25 mg total) by mouth daily. (Patient not taking: Reported on 08/04/2020) 30 tablet 0  . Cholecalciferol (VITAMIN D3) 1.25 MG (50000 UT) CAPS TAKE 1 CAPSULE BY MOUTH ONE TIME PER WEEK (Patient not taking: Reported on 08/04/2020) 4 capsule 0  . TRIUMEQ 600-50-300 MG tablet TAKE 1 TABLET BY MOUTH EVERY DAY 30 tablet 2  . valACYclovir (VALTREX) 1000 MG tablet Take 1 tablet (1,000 mg total) by mouth 2 (two) times daily. (Patient not taking: Reported on 08/04/2020) 20 tablet 2    Drug Regimen Review { DRUG REGIMEN TOIZTI:45809}  Home: Home Living Family/patient expects to be discharged to:: Private residence Living Arrangements: Spouse/significant other,Parent Available Help at Discharge: Family,Available PRN/intermittently Type of Home: House Home Access: Ramped entrance Home Layout: One level Bathroom Shower/Tub: Engineer, manufacturing systems: Standard Home Equipment: Walker - 4 wheels,Cane - single point   Functional History: Prior Function Level of Independence: Needs assistance Gait / Transfers Assistance Needed: has  not ambulated in ~ 2weeks per spouse, has been rolling on 4 wheeled  Rw. ADL's / Homemaking Assistance Needed: requires assistance with trnasfers.  Functional Status:  Mobility: Bed Mobility Overal bed mobility: Needs Assistance Bed Mobility: Rolling,Sidelying to Sit Rolling: Supervision Sidelying to sit: Supervision Supine to sit: Min guard Sit to supine: Min assist (increased time, reliance on rails and cues for sequencing, increased A for LE's required) Sit to sidelying:  Supervision General bed mobility comments: Min guard to move to edge of bed with increased time and effort. Transfers Overall transfer level: Needs assistance Equipment used: Rolling walker (2 wheeled) Transfer via Lift Equipment: Stedy Transfers: Sit to/from Stand Sit to Stand: Mod assist  Lateral/Scoot Transfers: Mod assist General transfer comment: Cues for hand placement to rise into standing.  Heavy mod assistance to boost into standing. Ambulation/Gait Ambulation/Gait assistance: Mod assist Gait Distance (Feet): 80 Feet Assistive device: Rolling walker (2 wheeled) Gait Pattern/deviations: Step-through pattern,Step-to pattern,Ataxic,Narrow base of support,Scissoring,Decreased stride length,Decreased dorsiflexion - right,Decreased dorsiflexion - left General Gait Details: Pt presents with B foot slap and B intermittent buckling in stance phase. Close chair follow for safety.  LOB noted x 2.  Pt required cues to increase BOS.  Increased gt distance this session. Posterior bias pulling RW causing wheels to rise assistance to ground RW for support.    ADL: ADL Overall ADL's : Needs assistance/impaired Eating/Feeding: Set up Grooming: Set up Upper Body Bathing: Set up,Cueing for sequencing,Sitting Lower Body Bathing: Moderate assistance,Sitting/lateral leans,Set up Upper Body Dressing : Minimal assistance,Sitting Upper Body Dressing Details (indicate cue type and reason): unable to tie due ataxia Lower Body Dressing: Total assistance,Bed level Toilet Transfer Details (indicate cue type and reason):  unable to transfer off of bed Toileting- Clothing Manipulation and Hygiene: Total assistance,Sit to/from stand,+2 for physical assistance,+2 for safety/equipment General ADL Comments: brief bouts of grooming and UB ADL's seated EOB increased time with observed swaying with decreased trunk strength and control.  Cognition: Cognition Overall Cognitive Status: Within Functional Limits for  tasks assessed Orientation Level: Oriented X4 Cognition Arousal/Alertness: Awake/alert Behavior During Therapy: WFL for tasks assessed/performed,Anxious Overall Cognitive Status: Within Functional Limits for tasks assessed Area of Impairment: Safety/judgement,Problem solving Orientation Level: Place,Situation Memory: Decreased short-term memory,Decreased recall of precautions Following Commands: Follows one step commands with increased time Safety/Judgement: Decreased awareness of deficits,Decreased awareness of safety Awareness: Emergent Problem Solving: Slow processing,Requires verbal cues,Requires tactile cues,Difficulty sequencing General Comments: After placing patient in chair, NT informed therapist that it was not safe to leave in chair. Asked patient, "Is it safe to get into bed by yourself?". He said "yes I can probably do it by myself."   Blood pressure (!) 131/95, pulse (!) 103, temperature 98.7 F (37.1 C), temperature source Oral, resp. rate 17, height 5\' 10"  (1.778 m), weight 82.8 kg, SpO2 97 %. Physical Exam Vitals and nursing note reviewed.  Constitutional:      Appearance: Normal appearance.  Neurological:     Mental Status: He is alert.     Comments: Speech clear. Appropriate and interactive.  BLE weakness with ataxia LLE>RLE.  Bilateral foot drop.      Results for orders placed or performed during the hospital encounter of 08/03/20 (from the past 48 hour(s))  CBC with Differential/Platelet     Status: Abnormal   Collection Time: 08/14/20  3:54 AM  Result Value Ref Range   WBC 5.9 4.0 - 10.5 K/uL   RBC 2.68 (L) 4.22 - 5.81 MIL/uL   Hemoglobin 8.7 (L) 13.0 - 17.0 g/dL   HCT 40.924.6 (L) 81.139.0 - 91.452.0 %   MCV 91.8 80.0 - 100.0 fL   MCH 32.5 26.0 - 34.0 pg   MCHC 35.4 30.0 - 36.0 g/dL   RDW 78.218.2 (H) 95.611.5 - 21.315.5 %   Platelets 322 150 - 400 K/uL   nRBC 0.0 0.0 - 0.2 %   Neutrophils Relative % 48 %   Neutro Abs 2.9 1.7 - 7.7 K/uL   Lymphocytes Relative 34 %    Lymphs Abs 2.0 0.7 - 4.0 K/uL   Monocytes Relative 12 %   Monocytes Absolute 0.7 0.1 - 1.0 K/uL   Eosinophils Relative 5 %   Eosinophils Absolute 0.3 0.0 - 0.5 K/uL   Basophils Relative 0 %   Basophils Absolute 0.0 0.0 - 0.1 K/uL   Immature Granulocytes 1 %   Abs Immature Granulocytes 0.05 0.00 - 0.07 K/uL    Comment: Performed at [facility], [address] [place], [place] [address]  Magnesium     Status: Abnormal   Collection Time: 08/14/20  3:54 AM  Result Value Ref Range   Magnesium 1.5 (L) 1.7 - 2.4 mg/dL    Comment: Performed at [facility], [address] [place], [place] [address]  Comprehensive metabolic panel     Status: Abnormal   Collection Time: 08/14/20  3:54 AM  Result Value Ref Range   Sodium 132 (L) 135 - 145 mmol/L   Potassium 4.4 3.5 - 5.1 mmol/L   Chloride 102 98 - 111 mmol/L   CO2 22 22 - 32 mmol/L   Glucose, Bld 91 70 - 99 mg/dL    Comment: Glucose reference range applies  only to samples taken after fasting for at least 8 hours.   BUN 6 6 - 20 mg/dL   Creatinine, Ser 1.61 0.61 - 1.24 mg/dL   Calcium 8.6 (L) 8.9 - 10.3 mg/dL   Total Protein 8.2 (H) 6.5 - 8.1 g/dL   Albumin 2.4 (L) 3.5 - 5.0 g/dL   AST 33 15 - 41 U/L   ALT 23 0 - 44 U/L   Alkaline Phosphatase 75 38 - 126 U/L   Total Bilirubin 0.6 0.3 - 1.2 mg/dL   GFR, Estimated >09 >60 mL/min    Comment: (NOTE) Calculated using the CKD-EPI Creatinine Equation (2021)    Anion gap 8 5 - 15    Comment: Performed at [facility], [address], [place], [place] [address]  Basic metabolic panel     Status: Abnormal   Collection Time: 08/15/20  2:35 AM  Result Value Ref Range   Sodium 132 (L) 135 - 145 mmol/L   Potassium 4.4 3.5 - 5.1 mmol/L   Chloride 102 98 - 111 mmol/L   CO2 22 22 - 32 mmol/L   Glucose, Bld 88 70 - 99 mg/dL    Comment: Glucose reference range applies only to samples taken after fasting for at least 8 hours.   BUN 6 6 - 20 mg/dL   Creatinine, Ser 8.11 0.61 -  1.24 mg/dL   Calcium 9.0 8.9 - 91.4 mg/dL   GFR, Estimated >78 >29 mL/min    Comment: (NOTE) Calculated using the CKD-EPI Creatinine Equation (2021)    Anion gap 8 5 - 15    Comment: Performed at [facility], [address], [place], [place] [address]  CBC with Differential/Platelet     Status: Abnormal   Collection Time: 08/15/20  2:35 AM  Result Value Ref Range   WBC 5.4 4.0 - 10.5 K/uL   RBC 2.67 (L) 4.22 - 5.81 MIL/uL   Hemoglobin 8.5 (L) 13.0 - 17.0 g/dL   HCT 08.6 (L) 57.8 - 46.9 %   MCV 92.9 80.0 - 100.0 fL   MCH 31.8 26.0 - 34.0 pg   MCHC 34.3 30.0 - 36.0 g/dL   RDW 62.9 (H) 52.8 - 41.3 %   Platelets 339 150 - 400 K/uL   nRBC 0.0 0.0 - 0.2 %   Neutrophils Relative % 44 %   Neutro Abs 2.4 1.7 - 7.7 K/uL   Lymphocytes Relative 41 %   Lymphs Abs 2.2 0.7 - 4.0 K/uL   Monocytes Relative 10 %   Monocytes Absolute 0.5 0.1 - 1.0 K/uL   Eosinophils Relative 4 %   Eosinophils Absolute 0.2 0.0 - 0.5 K/uL   Basophils Relative 0 %   Basophils Absolute 0.0 0.0 - 0.1 K/uL   Immature Granulocytes 1 %   Abs Immature Granulocytes 0.05 0.00 - 0.07 K/uL    Comment: Performed at [facility], [address] [place], [place] [address]  Magnesium     Status: Abnormal   Collection Time: 08/15/20  2:35 AM  Result Value Ref Range   Magnesium 1.4 (L) 1.7 - 2.4 mg/dL    Comment: Performed at [facility], [address] [place], [place] [address]   No results found.     Medical Problem List and Plan: 1.  *** secondary to ***  -patient may *** shower  -ELOS/Goals: *** 2.  Antithrombotics: -DVT/anticoagulation:  Pharmaceutical: Lovenox  -antiplatelet therapy: N/A 3. Pain Management: Gabapentin tid 4. Mood: Team  to provide ego support.   -antipsychotic agents: N/A 5. Neuropsych: This patient is not fully capable of making decisions on his own behalf. 6. Skin/Wound Care: Routine pressure relief measures  7. Fluids/Electrolytes/Nutrition: Monitor I/O. Check lytes  in am. 8. HTN: Monitor BP tid--continue Norvasc and coreg bid. . 9. HIV/AIDS: Now on Bitarvy (has been approved for assistance) with Bactrim daily for prophylaxis.  --Imodium prn for diarrhea 10. ETOH abuse with folate/thiamine deficiency:  Completed IV doses of folic acid/Thiamine   --On folic acid ane thiamine for supplement.  11. Hypomagnesemia: Remains low despite supplement 1.4-->2.0-->1.4  --received 2 gram IV doses--03/04, 03/06 and 03/8.   --Now on daily po supplement. Will monitor for now.   12. GAD/depression: On Zoloft 100 mg bid with low dose ativan bid prn.     Jacquelynn Cree, PA-C 08/15/2020

## 2020-08-15 NOTE — Discharge Summary (Signed)
Physician Discharge Summary  TED GOODNER PPJ:[id number] DOB: 1990/11/01 DOA: 08/03/2020  PCP: Etta Grandchild, MD  Admit date: 08/03/2020 Discharge date: 08/15/2020  Admitted From: Home  Discharge disposition: CIR   Recommendations for Outpatient Follow-Up:   . Follow up with [facility], infectious disease as outpatient. . Check CBC, BMP, magnesium in 2 to 3 days, replenish electrolytes as necessary . Continue rehabilitation. . Continue thiamine and folic acid replacement.  Discharge Diagnosis:   Principal Problem:   Acute metabolic encephalopathy Active Problems:   GAD (generalized anxiety disorder)   Essential hypertension, benign   Gastroesophageal reflux disease without esophagitis   HIV (human immunodeficiency virus infection) (HCC)   Hypokalemia   Lower extremity weakness   Alcohol abuse   Normocytic anemia   Diarrhea   Hypomagnesemia   Hyponatremia   Folic acid deficiency   Encephalopathy   Discharge Condition: Improved.  Diet recommendation: Low sodium, heart healthy.   Wound care: None.  Code status: Full.   History of Present Illness:   Mr. Lieurance is a 30 year old male with past medical history of HIV, noncompliant to medications, essential hypertension, alcohol abuse, generalized anxiety disorder, GERD r who recently had lower extremity edema of unclear etiology and was started on Lasix following which he reported not feeling well with generalized weakness. Patient has swelling improved with Lasix but had hypokalemia and was admitted initially with severe hypokalemia and lower extremity edema. Patient does have history of HIV but was noncompliant to medication.  Subsequently he developed diarrhea with intermittent confusion and hallucinations.  He does have history of alcohol consumption at home as well.  Patient was then seen by infectious disease and lumbar puncture was performed by interventional radiology for metabolic  encephalopathy.  Imaging including was negative for acute pathology.  LP was also obtained on 08/08/2020 which had normal opening pressure and negative for signs of infection.  He continued to exhibit intermittent episodes of confusion during hospitalization. Studies were reviewed by neurology on 08/09/2020.  Decision was made to start patient on IVIG as well as ganciclovir in conjunction with discussion with ID.  He was also placed on high-dose folic acid and thiamine supplementations.  Subsequently patient's condition improved.  He was seen by physical therapy and recommended CIR due to lower extremity weakness.  Hospital Course:   Following conditions were addressed during hospitalization as listed below,  Acute metabolic encephalopathy Patient had delirium hallucinations initially thought to be metabolic in nature.  History of alcohol use at home.  MRI was negative.  He was empirically treated with IVIG for 5 days for GBS with lower extremity weakness and ganciclovir high-dose thiamine and folic acid.  Mentation has improved at this time.  CSF CMV is negative so ganciclovir has been discontinued on 08/14/2020.Marland Kitchen  Patient will be continued on thiamine and folic acid on   Alcohol abuse On the evening of 3/1 patient appeared to have further withdrawal symptoms and was given Ativan under CIWA protocol.  Subsequently CIWA was discontinued.  We will continue thiamine folic acid.  Patient is motivated to quit drinking.  Bilateral lower extremity weakness Neurology was consulted.  Could not rule out GBS so received 5 days of IVIG.  MRI of the cervical thoracic lumbar spine was unremarkable.  Continue thiamine and folic acid for now.  Weakness gradually improving. 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 physical therapy recommending CIR placement.  Folic acid deficiency Will be continued on folate supplements on discharge, initially received high-dose IV folic acid  HIV  Non compliant to meds. CD4 absolute count is  40,HIV RNA 104,000.  Patient was seen by ID.  Recommend Biktarvy and Bactrim on discharge.  Diarrhea -Patient had negative GI pathogen panel, CMV titer negative as well.  Overall improved.  Continue loperamide.   Normocytic anemia Folic acid levels low.  Continue to monitor closely.  Latest hemoglobin of 8.5. Iron stores adequate.Folate low, 3.4.Vitamin B12 726  Hyponatremia, mild Would need to monitor as outpatient.  Hypomagnesemia Likely from GI loss from diarrhea.  Continue to replenish with magnesium oxide.  Will receive 2 grams of magnesium sulfate IV prior to discharge.  Hypokalemia Improved after replacement.  Essential hypertension -Continue amlodipine and Coreg on discharge.  Generalized anxiety disorder -Continue Zoloft  Elevated troponin Likely nonspecific elevation secondary to anemia, hypokalemia demand ischemia.  No chest pain.  EKG showed sinus tachycardia.  Echo with no significant abnormality.  CT angiogram of the chest was negative for PE.  Cardiology recommended no further work-up.    Disposition.  At this time, patient is stable for disposition to CIR.  Patient will need to follow-up with PCP and [facility] as outpatient  Medical Consultants:    Infectious disease  Neurology  PMR  IR  Cardiology  Procedures:    Lumbar puncture on 08/08/2020 Subjective:   Today, patient was seen and examined at bedside.  Patient denies any nausea vomiting fever chills or rigor.  Has mild weakness in the legs.  Did walk some to the door with a walker  Discharge Exam:   Vitals:   08/14/20 2010 08/15/20 0747  BP: 125/86 (!) 131/95  Pulse: 91 (!) 103  Resp: 18 17  Temp: 98.3 F (36.8 C) 98.7 F (37.1 C)  SpO2: 100% 97%   Vitals:   08/14/20 0758 08/14/20 1513 08/14/20 2010 08/15/20 0747  BP: (!) 139/98 134/81 125/86 (!) 131/95  Pulse: (!) 103 100 91 (!) 103  Resp: Temp: 98.9 F (37.2 C) 98.4 F (36.9 C) 98.3 F  (36.8 C) 98.7 F (37.1 C)  TempSrc: Oral Oral Oral Oral  SpO2: 100% 100% 100% 97%  Weight:      Height:       General: Alert awake, not in obvious distress HENT: pupils equally reacting to light,  No scleral pallor or icterus noted. Oral mucosa is moist.  Chest:  Clear breath sounds.  Diminished breath sounds bilaterally. No crackles or wheezes.  CVS: S1 &S2 heard. No murmur.  Regular rate and rhythm. Abdomen: Soft, nontender, nondistended.  Bowel sounds are heard.   Extremities: No cyanosis, clubbing or edema.  Peripheral pulses are palpable. B/L LE strength 4/5 Psych: Alert, awake and oriented, normal mood CNS:  No cranial nerve deficits.  Mild weakness of the bilateral lower extremities. Skin: Warm and dry.  No rashes noted.  The results of significant diagnostics from this hospitalization (including imaging, microbiology, ancillary and laboratory) are listed below for reference.     Diagnostic Studies:   DG Chest Port 1 View  Result Date: 08/03/2020 CLINICAL DATA:  Weakness, anorexia, HIV positive EXAM: PORTABLE CHEST 1 VIEW COMPARISON:  07/31/2017 FINDINGS: The heart size and mediastinal contours are within normal limits. Both lungs are clear. The visualized skeletal structures are unremarkable. IMPRESSION: No active disease. Electronically Signed   By: Sharlet Salina M.D.   On: 08/03/2020 20:27   ECHOCARDIOGRAM COMPLETE  Result Date: 08/04/2020    ECHOCARDIOGRAM REPORT   Patient Name:   LEILAN A  Fredricksen Date of Exam: 08/04/2020 Medical Rec #:  [id number]          Height:       70.0 in Accession #:    [id number]         Weight:       182.5 lb Date of Birth:  03/22/1991           BSA:          2.008 m Patient Age:    30 years           BP:           106/75 mmHg Patient Gender: M                  HR:           94 bpm. Exam Location:  Inpatient Procedure: 2D Echo, Cardiac Doppler and Color Doppler Indications:    Bilateral Lower Extremity Edema [id number].  History:        Patient has no  prior history of Echocardiogram examinations.                 Risk Factors:Hypertension. HIV. GERD.  Sonographer:    Elmarie Shiley Dance Referring Phys: Nilda Calamity AKULA IMPRESSIONS  1. Left ventricular ejection fraction, by estimation, is 60 to 65%. The left ventricle has normal function. The left ventricle has no regional wall motion abnormalities. There is mild left ventricular hypertrophy. Left ventricular diastolic parameters were normal.  2. Right ventricular systolic function is normal. The right ventricular size is normal.  3. Left atrial size was mildly dilated.  4. The mitral valve is normal in structure. No evidence of mitral valve regurgitation. No evidence of mitral stenosis.  5. The aortic valve is normal in structure. Aortic valve regurgitation is not visualized. No aortic stenosis is present.  6. Aortic dilatation noted. There is mild dilatation at the level of the sinuses of Valsalva, measuring 40 mm.  7. The inferior vena cava is normal in size with greater than 50% respiratory variability, suggesting right atrial pressure of 3 mmHg. FINDINGS  Left Ventricle: Left ventricular ejection fraction, by estimation, is 60 to 65%. The left ventricle has normal function. The left ventricle has no regional wall motion abnormalities. The left ventricular internal cavity size was normal in size. There is  mild left ventricular hypertrophy. Left ventricular diastolic parameters were normal. Right Ventricle: The right ventricular size is normal. No increase in right ventricular wall thickness. Right ventricular systolic function is normal. Left Atrium: Left atrial size was mildly dilated. Right Atrium: Right atrial size was normal in size. Pericardium: There is no evidence of pericardial effusion. Mitral Valve: The mitral valve is normal in structure. No evidence of mitral valve regurgitation. No evidence of mitral valve stenosis. Tricuspid Valve: The tricuspid valve is normal in structure. Tricuspid valve  regurgitation is not demonstrated. No evidence of tricuspid stenosis. Aortic Valve: The aortic valve is normal in structure. Aortic valve regurgitation is not visualized. No aortic stenosis is present. Pulmonic Valve: The pulmonic valve was normal in structure. Pulmonic valve regurgitation is not visualized. No evidence of pulmonic stenosis. Aorta: Aortic dilatation noted. There is mild dilatation at the level of the sinuses of Valsalva, measuring 40 mm. Venous: The inferior vena cava is normal in size with greater than 50% respiratory variability, suggesting right atrial pressure of 3 mmHg. IAS/Shunts: No atrial level shunt detected by color flow Doppler.  LEFT VENTRICLE PLAX 2D LVIDd:  5.00 cm  Diastology LVIDs:         3.30 cm  LV e' medial:    9.68 cm/s LV PW:         1.30 cm  LV E/e' medial:  8.8 LV IVS:        0.90 cm  LV e' lateral:   12.40 cm/s LVOT diam:     2.40 cm  LV E/e' lateral: 6.9 LV SV:         98 LV SV Index:   49 LVOT Area:     4.52 cm  RIGHT VENTRICLE             IVC RV Basal diam:  3.00 cm     IVC diam: 1.60 cm RV Mid diam:    2.40 cm RV S prime:     12.90 cm/s TAPSE (M-mode): 2.8 cm LEFT ATRIUM             Index       RIGHT ATRIUM           Index LA diam:        4.20 cm 2.09 cm/m  RA Area:     18.10 cm LA Vol (A2C):   88.5 ml 44.08 ml/m RA Volume:   51.70 ml  25.75 ml/m LA Vol (A4C):   78.0 ml 38.85 ml/m LA Biplane Vol: 86.3 ml 42.98 ml/m  AORTIC VALVE LVOT Vmax:   109.00 cm/s LVOT Vmean:  78.400 cm/s LVOT VTI:    0.217 m  AORTA Ao Root diam: 4.00 cm Ao Asc diam:  3.20 cm MITRAL VALVE MV Area (PHT): 2.71 cm    SHUNTS MV Decel Time: 280 msec    Systemic VTI:  0.22 m MV E velocity: 85.60 cm/s  Systemic Diam: 2.40 cm Donato Schultz MD Electronically signed by Donato Schultz MD Signature Date/Time: 08/04/2020/3:13:38 PM    Final      Labs:   Basic Metabolic Panel: Recent Labs  Lab 08/11/20 0612 08/12/20 0245 08/13/20 0119 08/14/20 0354 08/15/20 0235  NA 129* 130* 132* 132*  132*  K 3.9 4.0 4.1 4.4 4.4  CL 96* 98 99 102 102  CO2 GLUCOSE 94 76 111* 91 88  BUN 8 6 <5* 6 6  CREATININE 1.00 0.88 0.89 0.88 0.84  CALCIUM 8.3* 8.4* 8.8* 8.6* 9.0  MG 1.5* 1.7 1.4* 1.5* 1.4*   GFR Estimated Creatinine Clearance: 134 mL/min (by C-G formula based on SCr of 0.84 mg/dL). Liver Function Tests: Recent Labs  Lab 08/10/20 0133 08/11/20 0612 08/12/20 0245 08/13/20 0119 08/14/20 0354  AST 41 47* 34 36 33  ALT ALKPHOS 76 75 75 80 75  BILITOT 1.1 0.9 1.1 1.0 0.6  PROT 6.5 6.7 7.3 7.5 8.2*  ALBUMIN 2.3* 2.2* 2.4* 2.5* 2.4*   No results for input(s): LIPASE, AMYLASE in the last 168 hours. No results for input(s): AMMONIA in the last 168 hours. Coagulation profile No results for input(s): INR, PROTIME in the last 168 hours.  CBC: Recent Labs  Lab 08/11/20 0612 08/12/20 0245 08/13/20 0119 08/14/20 0354 08/15/20 0235  WBC 8.3 8.6 9.5 5.9 5.4  NEUTROABS 5.1 5.2 5.7 2.9 2.4  HGB 8.5* 8.6* 8.8* 8.7* 8.5*  HCT 24.8* 24.4* 24.8* 24.6* 24.8*  MCV 91.9 91.7 91.9 91.8 92.9  PLT 265 250 294 322 339   Cardiac Enzymes: No results for input(s): CKTOTAL, CKMB, CKMBINDEX, TROPONINI in the last 168  hours. BNP: Invalid input(s): POCBNP CBG: No results for input(s): GLUCAP in the last 168 hours. D-Dimer No results for input(s): DDIMER in the last 72 hours. Hgb A1c No results for input(s): HGBA1C in the last 72 hours. Lipid Profile No results for input(s): CHOL, HDL, LDLCALC, TRIG, CHOLHDL, LDLDIRECT in the last 72 hours. Thyroid function studies No results for input(s): TSH, T4TOTAL, T3FREE, THYROIDAB in the last 72 hours.  Invalid input(s): FREET3 Anemia work up No results for input(s): VITAMINB12, FOLATE, FERRITIN, TIBC, IRON, RETICCTPCT in the last 72 hours. Microbiology Recent Results (from the past 240 hour(s))  Gastrointestinal Panel by PCR , Stool     Status: None   Collection Time: 08/06/20  4:59 PM   Specimen: Stool   Result Value Ref Range Status   Campylobacter species NOT DETECTED NOT DETECTED Final   Plesimonas shigelloides NOT DETECTED NOT DETECTED Final   Salmonella species NOT DETECTED NOT DETECTED Final   Yersinia enterocolitica NOT DETECTED NOT DETECTED Final   Vibrio species NOT DETECTED NOT DETECTED Final   Vibrio cholerae NOT DETECTED NOT DETECTED Final   Enteroaggregative E coli (EAEC) NOT DETECTED NOT DETECTED Final   Enteropathogenic E coli (EPEC) NOT DETECTED NOT DETECTED Final   Enterotoxigenic E coli (ETEC) NOT DETECTED NOT DETECTED Final   Shiga like toxin producing E coli (STEC) NOT DETECTED NOT DETECTED Final   Shigella/Enteroinvasive E coli (EIEC) NOT DETECTED NOT DETECTED Final   Cryptosporidium NOT DETECTED NOT DETECTED Final   Cyclospora cayetanensis NOT DETECTED NOT DETECTED Final   Entamoeba histolytica NOT DETECTED NOT DETECTED Final   Giardia lamblia NOT DETECTED NOT DETECTED Final   Adenovirus F40/41 NOT DETECTED NOT DETECTED Final   Astrovirus NOT DETECTED NOT DETECTED Final   Norovirus GI/GII NOT DETECTED NOT DETECTED Final   Rotavirus A NOT DETECTED NOT DETECTED Final   Sapovirus (I, II, IV, and V) NOT DETECTED NOT DETECTED Final    Comment: Performed at [facility], [address] [place], [place] [address]  Culture, fungus without smear     Status: None (Preliminary result)   Collection Time: 08/07/20  1:28 PM   Specimen: CSF; Cerebrospinal Fluid  Result Value Ref Range Status   Specimen Description CSF  Final   Special Requests Immunocompromised  Final   Culture   Final    NO FUNGUS ISOLATED AFTER 6 DAYS Performed at [facility], [address], [place], [place] [address]    Report Status PENDING  Incomplete  CSF culture     Status: None   Collection Time: 08/07/20  1:28 PM   Specimen: CSF; Cerebrospinal Fluid  Result Value Ref Range Status   Specimen Description CSF  Final   Special Requests Immunocompromised  Final   Gram Stain NO  WBC SEEN NO ORGANISMS SEEN   Final   Culture   Final    NO GROWTH 3 DAYS Performed at [facility], [address], [place], [place] [address]    Report Status 08/12/2020 FINAL  Final  MRSA PCR Screening     Status: None   Collection Time: 08/08/20  5:34 AM   Specimen: Nasopharyngeal  Result Value Ref Range Status   MRSA by PCR NEGATIVE NEGATIVE Final    Comment:        The GeneXpert MRSA Assay (FDA approved for NASAL specimens only), is one component of a comprehensive MRSA colonization surveillance program. It is not intended to diagnose MRSA infection nor to guide or monitor  treatment for MRSA infections. Performed at [facility], [address] [place], [place] [address]   Georgana Curiooxoplasma Gondii, PCR     Status: None   Collection Time: 08/09/20  8:28 AM   Specimen: Blood  Result Value Ref Range Status   Toxoplasma Gondii, PCR Negative Negative Final    Comment: (NOTE) No Toxoplasma gondii DNA detected. This test was developed and its performance characteristics determined by [facility]. It has not been cleared or approved by the U.S. Food and Drug Administration. The FDA has determined that such clearance or approval is not necessary. This test is used for clinical purposes. It should not be regarded as investigational or research. 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  [facility]  Continue to take medications as prescribed.   Continue rehabilitation.   Increase activity slowly   Complete by: As directed    No wound care   Complete by: As directed      Allergies as of 08/15/2020   No Known Allergies     Medication List    STOP taking these medications   chlorthalidone 25 MG tablet Commonly known as: HYGROTON   Triumeq 600-50-300 MG tablet Generic drug: abacavir-dolutegravir-lamiVUDine   valACYclovir 1000 MG tablet Commonly known as: VALTREX   Vitamin D3 1.25 MG (50000 UT) Caps     TAKE these medications   amLODipine 10 MG tablet Commonly known as: NORVASC Take 1 tablet (10 mg total) by mouth daily. Start taking on: August 16, 2020   bictegravir-emtricitabine-tenofovir AF 50-200-25 MG Tabs tablet Commonly known as: BIKTARVY Take 1 tablet by mouth daily.   carvedilol 12.5 MG tablet Commonly known as: COREG 1 tab by mouth twice per day   folic acid 1 MG tablet Commonly known as: FOLVITE Take 1 tablet (1 mg total) by mouth daily. Start taking on: August 16, 2020   furosemide 20 MG tablet Commonly known as: LASIX Take 1 tablet (20 mg total) by mouth daily as needed for fluid or edema. 1 tab by mouth in the AM, and 1 in the PM for persistent swelling What changed:   how much to take  how to take this  when to take this  reasons to take this   gabapentin 400 MG capsule Commonly known as: NEURONTIN Take 1 capsule (400 mg total) by mouth 3 (three) times daily.   Gerhardt's butt cream Crea Apply 1 application topically 3 (three) times daily.   loperamide 2 MG capsule Commonly known as: IMODIUM Take 1 capsule (2 mg total) by mouth as needed for diarrhea or loose stools.   LORazepam 0.5 MG tablet Commonly known as: ATIVAN Take 0.5 tablets (0.25 mg total) by mouth 2 (two) times daily as needed for anxiety.   magnesium oxide 400 (241.3 Mg) MG tablet Commonly known as: MAG-OX Take 2 tablets (800 mg total) by mouth daily. Start taking on: August 16, 2020   multivitamin tablet Take 1  tablet  by mouth daily.   pantoprazole 40 MG tablet Commonly known as: PROTONIX Take 1 tablet (40 mg total) by mouth daily. Start taking on: August 16, 2020   sertraline 100 MG tablet Commonly known as: ZOLOFT Take 1 tablet (100 mg total) by mouth daily.   sulfamethoxazole-trimethoprim 800-160 MG tablet Commonly known as: BACTRIM DS Take 1 tablet by mouth daily. Start taking on: August 16, 2020   thiamine 100 MG tablet Take 1 tablet (100 mg total) by mouth daily. Start taking on: August 16, 2020       Follow-up Information    [facility] Follow up in 2 week(s).   Specialty: Neurology Why: follow up of weakness Contact information: [address] [place] [address] [phone]               Time coordinating discharge: 39 minutes  Signed:  Laxman Pokhrel  [facility] 08/15/2020, 11:20 AM

## 2020-08-15 NOTE — Progress Notes (Signed)
Inpatient Rehabilitation  Patient information reviewed and entered into eRehab system by Melissa M. Bowie, M.A., CCC/SLP, PPS Coordinator.  Information including medical coding, functional ability and quality indicators will be reviewed and updated through discharge.    

## 2020-08-15 NOTE — Progress Notes (Signed)
Pt transferred to 4W22 as ordered. Pt remains alert/oriented in no apparent distress. No complaints.Pt situated to room and received by 4W staff nurse.

## 2020-08-15 NOTE — Progress Notes (Signed)
Pt admitted to room [phone]. Denies pain or discomfort at this time. Oriented to floor and rehab policy.   Marylu Lund, RN

## 2020-08-15 NOTE — Progress Notes (Signed)
Subjective:  No new complaints  Antibiotics:  Anti-infectives (From admission, onward)   Start     Dose/Rate Route Frequency Ordered Stop   08/16/20 0000  sulfamethoxazole-trimethoprim (BACTRIM DS) 800-160 MG tablet        1 tablet Oral Daily 08/15/20 1118     08/15/20 0000  bictegravir-emtricitabine-tenofovir AF (BIKTARVY) 50-200-25 MG TABS tablet        1 tablet Oral Daily 08/15/20 1118     08/11/20 2100  ganciclovir (CYTOVENE) 415 mg in sodium chloride 0.9 % 100 mL IVPB  Status:  Discontinued       Note to Pharmacy: Monitor renal funtion and dose adjust   5 mg/kg  82.8 kg 100 mL/hr over 60 Minutes Intravenous Every 12 hours 08/11/20 1755 08/14/20 1049   08/09/20 1830  ganciclovir (CYTOVENE) 415 mg in sodium chloride 0.9 % 100 mL IVPB  Status:  Discontinued       Note to Pharmacy: Monitor renal funtion and dose adjust   5 mg/kg  82.8 kg 100 mL/hr over 60 Minutes Intravenous Every 12 hours 08/09/20 1649 08/11/20 1755   08/09/20 1115  sulfamethoxazole-trimethoprim (BACTRIM DS) 800-160 MG per tablet 1 tablet        1 tablet Oral Daily 08/09/20 1015     08/09/20 1100  bictegravir-emtricitabine-tenofovir AF (BIKTARVY) 50-200-25 MG per tablet 1 tablet        1 tablet Oral Daily 08/09/20 1008     08/04/20 1000  abacavir-dolutegravir-lamiVUDine (TRIUMEQ) 600-50-300 MG per tablet 1 tablet  Status:  Discontinued        1 tablet Oral Daily 08/04/20 0820 08/07/20 1044      Medications: Scheduled Meds: . amLODipine  10 mg Oral Daily  . bictegravir-emtricitabine-tenofovir AF  1 tablet Oral Daily  . carbamide peroxide  5 drop Both EARS BID  . carvedilol  12.5 mg Oral BID WC  . folic acid  1 mg Oral Daily  . gabapentin  400 mg Oral TID  . Gerhardt's butt cream   Topical TID  . magnesium oxide  800 mg Oral Daily  . multivitamin with minerals  1 tablet Oral Daily  . pantoprazole  40 mg Oral Daily  . sertraline  100 mg Oral Daily  . sulfamethoxazole-trimethoprim  1 tablet Oral  Daily  . thiamine  100 mg Oral Daily   Continuous Infusions: PRN Meds:.lip balm, loperamide, LORazepam, ondansetron **OR** ondansetron (ZOFRAN) IV    Objective: Weight change:   Intake/Output Summary (Last 24 hours) at 08/15/2020 1221 Last data filed at 08/15/2020 0721 Gross per 24 hour  Intake --  Output 1550 ml  Net -1550 ml   Blood pressure (!) 131/95, pulse (!) 103, temperature 98.7 F (37.1 C), temperature source Oral, resp. rate 17, height $RemoveBe'5\' 10"'hEcOxYVri$  (1.778 m), weight 82.8 kg, SpO2 97 %. Temp:  [98.3 F (36.8 C)-98.7 F (37.1 C)] 98.7 F (37.1 C) (03/08 0747) Pulse Rate:  [91-103] 103 (03/08 0747) Resp:  [16-18] 17 (03/08 0747) BP: (125-134)/(81-95) 131/95 (03/08 0747) SpO2:  [97 %-100 %] 97 % (03/08 0747)  Physical Exam: Physical Exam Constitutional:      Appearance: He is well-developed.  HENT:     Head: Normocephalic and atraumatic.  Eyes:     Extraocular Movements: Extraocular movements intact.     Conjunctiva/sclera: Conjunctivae normal.  Cardiovascular:     Rate and Rhythm: Normal rate and regular rhythm.  Pulmonary:     Effort: Pulmonary effort is normal. No respiratory  distress.     Breath sounds: No wheezing.  Abdominal:     General: There is no distension.     Palpations: Abdomen is soft.  Musculoskeletal:        General: Normal range of motion.     Cervical back: Normal range of motion and neck supple.  Skin:    General: Skin is warm and dry.     Coloration: Skin is not pale.     Findings: No erythema or rash.  Neurological:     Mental Status: He is lethargic.  Psychiatric:        Attention and Perception: Attention normal.        Mood and Affect: Mood is depressed.        Speech: Speech normal.        Behavior: Behavior normal.        Thought Content: Thought content normal.      CBC:    BMET Recent Labs    08/14/20 0354 08/15/20 0235  NA 132* 132*  K 4.4 4.4  CL 102 102  CO2 22 22  GLUCOSE 91 88  BUN 6 6  CREATININE 0.88 0.84   CALCIUM 8.6* 9.0     Liver Panel  Recent Labs    08/13/20 0119 08/14/20 0354  PROT 7.5 8.2*  ALBUMIN 2.5* 2.4*  AST 36 33  ALT 22 23  ALKPHOS 80 75  BILITOT 1.0 0.6       Sedimentation Rate No results for input(s): ESRSEDRATE in the last 72 hours. C-Reactive Protein No results for input(s): CRP in the last 72 hours.  Micro Results: Recent Results (from the past 720 hour(s))  SARS CORONAVIRUS 2 (TAT 6-24 HRS) Nasopharyngeal Nasopharyngeal Swab     Status: None   Collection Time: 08/03/20 10:00 PM   Specimen: Nasopharyngeal Swab  Result Value Ref Range Status   SARS Coronavirus 2 NEGATIVE NEGATIVE Final    Comment: (NOTE) SARS-CoV-2 target nucleic acids are NOT DETECTED.  The SARS-CoV-2 RNA is generally detectable in upper and lower respiratory specimens during the acute phase of infection. Negative results do not preclude SARS-CoV-2 infection, do not rule out co-infections with other pathogens, and should not be used as the sole basis for treatment or other patient management decisions. Negative results must be combined with clinical observations, patient history, and epidemiological information. The expected result is Negative.  Fact Sheet for Patients: HairSlick.no  Fact Sheet for Healthcare Providers: quierodirigir.com  This test is not yet approved or cleared by the [place] FDA and  has been authorized for detection and/or diagnosis of SARS-CoV-2 by FDA under an Emergency Use Authorization (EUA). This EUA will remain  in effect (meaning this test can be used) for the duration of the COVID-19 declaration under Se ction 564(b)(1) of the Act, 21 U.S.C. section 360bbb-3(b)(1), unless the authorization is terminated or revoked sooner.  Performed at [facility], [address] [place], [place] [address]   Gastrointestinal Panel by PCR , Stool     Status: None   Collection Time: 08/06/20   4:59 PM   Specimen: Stool  Result Value Ref Range Status   Campylobacter species NOT DETECTED NOT DETECTED Final   Plesimonas shigelloides NOT DETECTED NOT DETECTED Final   Salmonella species NOT DETECTED NOT DETECTED Final   Yersinia enterocolitica NOT DETECTED NOT DETECTED Final   Vibrio species NOT DETECTED NOT DETECTED Final   Vibrio cholerae NOT DETECTED NOT DETECTED Final   Enteroaggregative E coli (EAEC)  NOT DETECTED NOT DETECTED Final   Enteropathogenic E coli (EPEC) NOT DETECTED NOT DETECTED Final   Enterotoxigenic E coli (ETEC) NOT DETECTED NOT DETECTED Final   Shiga like toxin producing E coli (STEC) NOT DETECTED NOT DETECTED Final   Shigella/Enteroinvasive E coli (EIEC) NOT DETECTED NOT DETECTED Final   Cryptosporidium NOT DETECTED NOT DETECTED Final   Cyclospora cayetanensis NOT DETECTED NOT DETECTED Final   Entamoeba histolytica NOT DETECTED NOT DETECTED Final   Giardia lamblia NOT DETECTED NOT DETECTED Final   Adenovirus F40/41 NOT DETECTED NOT DETECTED Final   Astrovirus NOT DETECTED NOT DETECTED Final   Norovirus GI/GII NOT DETECTED NOT DETECTED Final   Rotavirus A NOT DETECTED NOT DETECTED Final   Sapovirus (I, II, IV, and V) NOT DETECTED NOT DETECTED Final    Comment: Performed at [facility], [address] [place], [place] [address]  Culture, fungus without smear     Status: None (Preliminary result)   Collection Time: 08/07/20  1:28 PM   Specimen: CSF; Cerebrospinal Fluid  Result Value Ref Range Status   Specimen Description CSF  Final   Special Requests Immunocompromised  Final   Culture   Final    NO FUNGUS ISOLATED AFTER 6 DAYS Performed at [facility], [place] [address] [place], [place] [address]    Report Status PENDING  Incomplete  CSF culture     Status: None   Collection Time: 08/07/20  1:28 PM   Specimen: CSF; Cerebrospinal Fluid  Result Value Ref Range Status   Specimen Description CSF  Final   Special Requests Immunocompromised   Final   Gram Stain NO WBC SEEN NO ORGANISMS SEEN   Final   Culture   Final    NO GROWTH 3 DAYS Performed at [facility], [place] [address] [place], [place] [address]    Report Status 08/12/2020 FINAL  Final  MRSA PCR Screening     Status: None   Collection Time: 08/08/20  5:34 AM   Specimen: Nasopharyngeal  Result Value Ref Range Status   MRSA by PCR NEGATIVE NEGATIVE Final    Comment:        The GeneXpert MRSA Assay (FDA approved for NASAL specimens only), is one component of a comprehensive MRSA colonization surveillance program. It is not intended to diagnose MRSA infection nor to guide or monitor treatment for MRSA infections. Performed at [facility], [place] [address] [place], [place] [address]   Andee Poles, PCR     Status: None   Collection Time: 08/09/20  8:28 AM   Specimen: Blood  Result Value Ref Range Status   Toxoplasma Gondii, PCR Negative Negative Final    Comment: (NOTE) No Toxoplasma gondii DNA detected. This test was developed and its performance characteristics determined by [employer]. It has not been cleared or approved by the U.S. Food and Drug Administration. The FDA has determined that such clearance or approval is not necessary. This test is used for clinical purposes. It should not be regarded as investigational or research. Performed At: [facility] [place], [place] [id number] Rush Farmer MD SJ:[id number]     Studies/Results: No results found.    Assessment/Plan:  INTERVAL HISTORY:   CMV PCR from spinal fluid is negative  Principal Problem:   Acute metabolic encephalopathy Active Problems:   GAD (generalized anxiety disorder)   Essential hypertension, benign   Gastroesophageal reflux disease without esophagitis   HIV (human immunodeficiency virus infection) ([place])  Hypokalemia   Lower extremity weakness   Alcohol abuse   Normocytic anemia   Diarrhea   Hypomagnesemia    Hyponatremia   Folic acid deficiency   Encephalopathy    Harold Flores is a 30 y.o. male with  HIV, AIDS with loss of strength in his lower extremities gait instability and now numbness in his hands as well as feet encephalopathy waxing and waning neurological status.  Encephalopathy and neuro deficits:  Not completely clear to me what is causing this but could be related to his alcohol abuse he is getting thiamine and folate.  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 Tommy Medal at   [facility] is located in the [facility] at  [address] in [place].  [address], which is located to the left of the elevators.  Phone: [phone]  Fax: [phone]  [email or web address]  He should arrive 15 minutes prior to his appointment.  I will sign off for now  Please call with further questions.   LOS: 12 days   Alcide Evener 08/15/2020, 12:21 PM

## 2020-08-16 DIAGNOSIS — R7401 Elevation of levels of liver transaminase levels: Secondary | ICD-10-CM

## 2020-08-16 DIAGNOSIS — F1010 Alcohol abuse, uncomplicated: Secondary | ICD-10-CM

## 2020-08-16 DIAGNOSIS — E871 Hypo-osmolality and hyponatremia: Secondary | ICD-10-CM

## 2020-08-16 LAB — CBC WITH DIFFERENTIAL/PLATELET
Abs Immature Granulocytes: 0.04 10*3/uL (ref 0.00–0.07)
Basophils Absolute: 0 10*3/uL (ref 0.0–0.1)
Basophils Relative: 1 %
Eosinophils Absolute: 0.1 10*3/uL (ref 0.0–0.5)
Eosinophils Relative: 3 %
HCT: 24.5 % — ABNORMAL LOW (ref 39.0–52.0)
Hemoglobin: 8.5 g/dL — ABNORMAL LOW (ref 13.0–17.0)
Immature Granulocytes: 1 %
Lymphocytes Relative: 46 %
Lymphs Abs: 2.3 10*3/uL (ref 0.7–4.0)
MCH: 32.3 pg (ref 26.0–34.0)
MCHC: 34.7 g/dL (ref 30.0–36.0)
MCV: 93.2 fL (ref 80.0–100.0)
Monocytes Absolute: 0.4 10*3/uL (ref 0.1–1.0)
Monocytes Relative: 8 %
Neutro Abs: 2 10*3/uL (ref 1.7–7.7)
Neutrophils Relative %: 41 %
Platelets: 371 10*3/uL (ref 150–400)
RBC: 2.63 MIL/uL — ABNORMAL LOW (ref 4.22–5.81)
RDW: 18.7 % — ABNORMAL HIGH (ref 11.5–15.5)
WBC: 4.9 10*3/uL (ref 4.0–10.5)
nRBC: 0 % (ref 0.0–0.2)

## 2020-08-16 LAB — COMPREHENSIVE METABOLIC PANEL
ALT: 35 U/L (ref 0–44)
AST: 46 U/L — ABNORMAL HIGH (ref 15–41)
Albumin: 2.6 g/dL — ABNORMAL LOW (ref 3.5–5.0)
Alkaline Phosphatase: 72 U/L (ref 38–126)
Anion gap: 8 (ref 5–15)
BUN: 6 mg/dL (ref 6–20)
CO2: 24 mmol/L (ref 22–32)
Calcium: 9 mg/dL (ref 8.9–10.3)
Chloride: 100 mmol/L (ref 98–111)
Creatinine, Ser: 0.68 mg/dL (ref 0.61–1.24)
GFR, Estimated: >60 mL/min (ref >60)
Glucose, Bld: 95 mg/dL (ref 70–99)
Potassium: 4 mmol/L (ref 3.5–5.1)
Sodium: 132 mmol/L — ABNORMAL LOW (ref 135–145)
Total Bilirubin: 0.7 mg/dL (ref 0.3–1.2)
Total Protein: 8 g/dL (ref 6.5–8.1)

## 2020-08-16 LAB — MAGNESIUM: Magnesium: 1.5 mg/dL — ABNORMAL LOW (ref 1.7–2.4)

## 2020-08-16 MED ORDER — NICOTINE 14 MG/24HR TD PT24
14.0000 mg | MEDICATED_PATCH | Freq: Every day | TRANSDERMAL | Status: DC
  Administered 2020-08-16: 14 mg via TRANSDERMAL
  Filled 2020-08-16 (×3): qty 1

## 2020-08-16 MED ORDER — MAGNESIUM GLUCONATE 500 MG PO TABS
500.0000 mg | ORAL_TABLET | Freq: Every day | ORAL | Status: DC
  Administered 2020-08-16 – 2020-08-22 (×7): 500 mg via ORAL
  Filled 2020-08-16 (×7): qty 1

## 2020-08-16 NOTE — IPOC Note (Signed)
Individualized overall Plan of Care [facility]) Patient Details Name: Harold Flores MRN: [id number] DOB: [birth year]  Admitting Diagnosis: Progressive focal motor weakness  Hospital Problems: Principal Problem:   Progressive focal motor weakness Active Problems:   Transaminitis   ETOH abuse     Functional Problem List: Nursing Pain,Bowel,Skin Integrity,Endurance,Motor,Medication Management  PT Balance,Sensory,Endurance,Motor,Safety,Pain  OT Balance,Endurance,Motor,Pain,Skin Integrity,Sensory  SLP    TR         Basic ADL's: OT Eating,Grooming,Toileting,Bathing,Dressing     Advanced  ADL's: OT Simple Meal Preparation,Light Housekeeping     Transfers: PT Bed Mobility,Bed to [employer]  OT Toilet,Tub/Shower     Locomotion: PT Ambulation,Wheelchair Mobility,Stairs     Additional Impairments: OT None  SLP        TR      Anticipated Outcomes Item Anticipated Outcome  Self Feeding ind  Swallowing      Basic self-care  mod I  Toileting  mod I   Bathroom Transfers S  Bowel/Bladder  Patient will be able to manage bowel and bladder with Mod I assist.  Transfers  Supervision  Locomotion  Supervision with ambulation  Communication     Cognition     Pain  Patient will have pain level less than 4 on scale of 0-10  Safety/Judgment  Patient will remain free of injury, prevent falls with cues and reminders.   Therapy Plan: PT Intensity: Minimum of 1-2 x/day ,45 to 90 minutes PT Frequency: 5 out of 7 days PT Duration Estimated Length of Stay: 2.5 weeks OT Intensity: Minimum of 1-2 x/day, 45 to 90 minutes OT Frequency: 5 out of 7 days OT Duration/Estimated Length of Stay: 2 to 2.5 weeks      Team Interventions: Nursing Interventions Patient/Family Education,Pain Management,Bowel Management,Skin Care/Wound Management,Medication Management,Discharge Planning,Psychosocial Support,Disease Management/Prevention  PT interventions Ambulation/gait  training,DME/adaptive equipment instruction,Balance/vestibular training,Therapeutic Activities,UE/LE Coordination activities,Therapeutic Exercise,Splinting/orthotics,Patient/family education,Functional mobility training,Disease management/prevention,Stair training,UE/LE Strength taining/ROM,Wheelchair propulsion/positioning,Neuromuscular re-education,Discharge planning  OT Interventions Balance/vestibular training,Disease mangement/prevention,Neuromuscular re-education,Self Care/advanced ADL retraining,Therapeutic Exercise,Wheelchair propulsion/positioning,UE/LE Strength taining/ROM,Skin care/wound Midwife stimulation,Community reintegration,Patient/family education,Splinting/orthotics,UE/LE Coordination activities,Therapeutic Activities,Psychosocial support,Functional mobility training,Discharge planning,Cognitive remediation/compensation  SLP Interventions    TR Interventions    SW/CM Interventions Discharge Planning,Psychosocial Support,Patient/Family Education   Barriers to Discharge MD  Medical stability  Nursing      PT Medication compliance,Decreased caregiver support mom with fibromyalgia can provide S assist, prior history of noncompliance  OT      SLP      SW Insurance for SNF coverage,Medication compliance     Team Discharge Planning: Destination: PT-Home ,OT- Home , SLP-Home Projected Follow-up: PT-24 hour supervision/assistance,Home health PT, OT-  Home health OT, SLP-None Projected Equipment Needs: PT-Other (comment), OT- 3 in 1 bedside comode,Tub/shower bench, SLP-  Equipment Details: PT-possibly transport chair, OT-  Patient/family involved in discharge planning: PT- Patient,  OT-Patient, SLP-Patient  MD ELOS: 10-14 days. Medical Rehab Prognosis:  Good Assessment: 30 year old male with history of ETOH abuse, syphilis, HIV/AIDS (did not follow up with ID due to GAD), BLE edema (started on lasix 2/10  as refused to go to ED) who was admitted on 08/06/20 with BLE weakness with difficulty walking, paraesthesia, decreased appetite with loose stools, worsening of confusion with hallucinations. He was found to have significant hypokalemia with K-2.0 and elevated troponin felt to be multifactorial due to LVH, tachycardia and anemia. Dr. Iver Nestle consulted for input and expressed concerns of opportunistic infection and full work up initiated. MRI brain and spine done and was negative for acute abnormality  or abnormal cord signal.  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  Folate low -3.4 and Thiamine low-31.9.  He was treated with IVIG due to suspicion of GBS, high doses of IV B1 and folic acid due to concerns of folic acid/thiamine deficiency causing encephalopathy/confusion as well as IV ganciclovir for empiric CMV polyradiculopathy. 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  LP negative for JC virus, EGV, VSZ, VDRL, toxoplasma and CMV--mycobacterium TB.  He has had some improvement in BLE strength but continues to have BLE instability, ataxic gait with tendency to loose balance posteriorly as well as waxing and waning of mentation and b/l foot drop.  Will set goals for Supervision with PT/OT.   Due to the current state of emergency, patients may not be receiving their 3-hours of Medicare-mandated therapy.  See Team Conference Notes for weekly updates to the plan of care

## 2020-08-16 NOTE — Evaluation (Signed)
Physical Therapy Assessment and Plan  Patient Details  Name: Harold Flores MRN: [id number] Date of Birth: Jan 30, 1991  PT Diagnosis: Abnormality of gait, Ataxic gait and Muscle weakness Rehab Potential: Good ELOS: 2.5 weeks   Today's Date: 08/16/2020 PT Individual Time: 8182-9937 PT Individual Time Calculation (min): 57 min    Hospital Problem: Principal Problem:   Progressive focal motor weakness Active Problems:   Transaminitis   ETOH abuse   Past Medical History:  Past Medical History:  Diagnosis Date  . Anal fissure   . Anal fistula   . Anal ulcer   . Asthma   . Epistaxis   . GAD (generalized anxiety disorder)   . GERD (gastroesophageal reflux disease)   . Hemorrhoid   . HIV (human immunodeficiency virus infection) ([place])   . Hypertension   . Syphilis   . Varicella    Past Surgical History:  Past Surgical History:  Procedure Laterality Date  . EYE SURGERY  1994   blephroplasty right eye  . RADIOLOGY WITH ANESTHESIA N/A 08/08/2020   Procedure: MRI WITH ANESTHESIA- BRAIN WITH AND WITHOUT CONTRAST,  CERVICAL SPINE WITH AND WITHOUT CONTRAST, LUMBAR SPINE WITH WITHOUT CONTRAST, THORACIC SPINE WITH WITHOUT CONTRAST;  Surgeon: Radiologist, Medication, MD;  Location: [facility];  Service: Radiology;  Laterality: N/A;    Assessment & Plan Clinical Impression: Harold Flores is a 30 year old male with history of ETOH abuse, syphilis, HIV/AIDS (did not follow up with ID due to GAD), BLE edema (started on lasix 2/10 as refused to go to ED) who was admitted on 08/06/20 with BLE weakness with difficulty walking, paraesthesia, decreased appetite with loose stools, worsening of confusion with hallucinations. He was found to have significant hypokalemia with K-2.0 and elevated troponin felt to be multifactorial due to LVH, tachycardia and anemia. Dr. Curly Flores consulted for input and expressed concerns of opportunistic infection and full work up initiated. MRI brain and spine done and was  negative for acute abnormality or abnormal cord signal.  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  Folate low -3.4 and Thiamine low-31.9.   He was treated with IVIG due to suspicion of GBS, high doses of IV B1 and folic acid due to concerns of folic acid/thiamine deficiency causing encephalopathy/confusion as well as IV ganciclovir for empiric CMV polyradiculopathy. 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  LP negative for JC virus, EGV, VSZ, VDRL, toxoplasma and CMV--mycobacterium TB  pending.  He has had some improvement in BLE strength but continues to have BLE instability, ataxic gait with tendency to loose balance posteriorly as well as waxing and waning of mentation. CIR recommended due to functional decline.    Patient transferred to CIR on 08/15/2020 .   Patient currently requires mod with mobility secondary to muscle weakness and muscle paralysis and impaired timing and sequencing, ataxia and decreased coordination.  Prior to hospitalization, patient was modified independent  with mobility and lived with Family in a [place] home.  Home access is  Level entry.  Patient will benefit from skilled PT intervention to maximize safe functional mobility, minimize fall risk and decrease caregiver burden for planned discharge home with 24 hour supervision.  Anticipate patient will benefit from follow up [place] at discharge.  PT - End of Session Activity Tolerance: Tolerates 30+ min activity without fatigue Endurance Deficit: Yes Endurance Deficit Description: fatigues quickly with strenuous activity HR  up to 132 PT Assessment Rehab Potential (ACUTE/IP ONLY): Good PT Barriers to Discharge: Medication compliance;Decreased caregiver support PT Barriers to Discharge Comments: mom with fibromyalgia can provide S assist, prior history of noncompliance PT Patient  demonstrates impairments in the following area(s): Balance;Sensory;Endurance;Motor;Safety;Pain PT Transfers Functional Problem(s): Bed Mobility;Bed to Chair;Car;Furniture;Floor PT Locomotion Functional Problem(s): Ambulation;Wheelchair Mobility;Stairs PT Plan PT Intensity: Minimum of 1-2 x/day ,45 to 90 minutes PT Frequency: 5 out of 7 days PT Duration Estimated Length of Stay: 2.5 weeks PT Treatment/Interventions: Ambulation/gait training;DME/adaptive equipment instruction;Balance/vestibular training;Therapeutic Activities;UE/LE Coordination activities;Therapeutic Exercise;Splinting/orthotics;Patient/family education;Functional mobility training;Disease management/prevention;Stair training;UE/LE Strength taining/ROM;Wheelchair propulsion/positioning;Neuromuscular re-education;Discharge planning PT Transfers Anticipated Outcome(s): Supervision PT Locomotion Anticipated Outcome(s): Supervision with ambulation PT Recommendation Follow Up Recommendations: 24 hour supervision/assistance;Home health PT Patient destination: Home Equipment Recommended: Other (comment) Equipment Details: possibly transport chair   PT Evaluation Precautions/Restrictions Precautions Precautions: Fall General   Vital Signs  Pain Pain Assessment Pain Score: 0-No pain Pain Type: Acute pain Pain Location: Foot Pain Orientation: Right;Left Pain Descriptors / Indicators: Tingling Pain Onset: On-going Pain Intervention(s): Distraction Home Living/Prior Functioning Home Living Living Arrangements: Parent Available Help at Discharge: Family;Available PRN/intermittently Type of Home: Apartment Home Access: Level entry Home Layout: One level Bathroom Shower/Tub: Tub/shower unit Building surveyor) Bathroom Toilet: Standard  Lives With: Family Prior Function Level of Independence: Independent with transfers;Independent with homemaking with ambulation;Independent with basic ADLs;Independent with gait Driving:  Yes 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, multimedia Comments: 5 falls in 2 weeks prior to admission Vision/Perception     Cognition Overall Cognitive Status: Within Functional Limits for tasks assessed Orientation Level: Oriented X4 Attention: Alternating;Divided Alternating Attention: Appears intact Divided Attention: Appears intact Memory: Appears intact Awareness: Appears intact Problem Solving: Appears intact Safety/Judgment: Appears intact Sensation Sensation Light Touch: Impaired by gross assessment Hot/Cold: Not tested Proprioception: Impaired by gross assessment Stereognosis: Not tested Additional Comments: decreased in hands and feet Coordination Gross Motor Movements are Fluid and Coordinated: No Fine Motor Movements are Fluid and Coordinated: No Coordination and Movement Description: slowed coordination with hands and ataxic in LE's Motor  Motor Motor: Ataxia Motor - Skilled Clinical Observations: ataxic and weak more distally with foot drop bilaterally   Trunk/Postural Assessment  Cervical Assessment Cervical Assessment: Within Functional Limits Thoracic Assessment Thoracic Assessment: Within Functional Limits Lumbar Assessment Lumbar Assessment: Within Functional Limits Postural Control Postural Control: Deficits on evaluation Trunk Control: ataxic with mobility/ADL's  Balance Balance Balance Assessed: Yes Dynamic Sitting Balance Dynamic Sitting - Level of Assistance: 5: Stand by assistance Dynamic Sitting Balance - Compensations: assist for safety reaching for feet Dynamic Sitting - Balance Activities: Forward lean/weight shifting Static Standing Balance Static Standing - Balance Support: Bilateral upper extremity supported Static Standing - Level of Assistance: 4: Min assist Static Standing - Comment/# of Minutes: using RW for UE support Dynamic Standing Balance Dynamic Standing - Balance Support: Bilateral upper extremity  supported Dynamic Standing - Level of Assistance: 3: Mod assist Dynamic Standing - Balance Activities: Other (comment) Dynamic Standing - Comments: stand stepping to toilet Extremity Assessment      RLE Assessment RLE Assessment: Exceptions to [facility] Active Range of Motion (AROM) Comments: WFL General Strength Comments: Hip flexion 3+/5, knee extension 4-/5, flexion 4-/5, ankle DF 1/5, PF 3-/5 LLE Assessment Active Range of Motion (AROM) Comments: WFL General Strength Comments: Hip flexion 3+/5, knee extension 3+/5, flexion 3+/5, ankle DF 1/5, plantarflexion 3-/5  Care Tool Care Tool Bed Mobility Roll left and right activity   Roll left and right assist level:  Supervision/Verbal cueing    Sit to lying activity   Sit to lying assist level: Supervision/Verbal cueing    Lying to sitting edge of bed activity   Lying to sitting edge of bed assist level: Minimal Assistance - Patient > 75%     Care Tool Transfers Sit to stand transfer   Sit to stand assist level: Moderate Assistance - Patient 50 - 74%    Chair/bed transfer   Chair/bed transfer assist level: Moderate Assistance - Patient 50 - 74%     Toilet transfer   Assist Level: Moderate Assistance - Patient 50 - 74%    Car transfer Car transfer activity did not occur: Safety/medical concerns        Care Tool Locomotion Ambulation   Assist level: Moderate Assistance - Patient 50 - 74%   Max distance: 65'  Walk 10 feet activity   Assist level: Moderate Assistance - Patient - 50 - 74% Assistive device: Walker-rolling   Walk 50 feet with 2 turns activity   Assist level: Moderate Assistance - Patient - 50 - 74% Assistive device: Walker-rolling  Walk 150 feet activity Walk 150 feet activity did not occur: Safety/medical concerns      Walk 10 feet on uneven surfaces activity Walk 10 feet on uneven surfaces activity did not occur: Safety/medical concerns      Stairs   Assist level: Moderate Assistance - Patient - 50 -  74% Stairs assistive device: 2 hand rails    Walk up/down 1 step activity   Walk up/down 1 step (curb) assist level: Moderate Assistance - Patient - 50 - 74%   Walk up/down 4 steps activity did not occuR: Safety/medical concerns  Walk up/down 4 steps activity      Walk up/down 12 steps activity Walk up/down 12 steps activity did not occur: Safety/medical concerns      Pick up small objects from floor Pick up small object from the floor (from standing position) activity did not occur: Safety/medical concerns      Wheelchair Will patient use wheelchair at discharge?: No Type of Wheelchair: Manual   Wheelchair assist level: Minimal Assistance - Patient > 75% Max wheelchair distance: 110'  Wheel 50 feet with 2 turns activity   Assist Level: Supervision/Verbal cueing  Wheel 150 feet activity Wheelchair 150 feet activity did not occur: Safety/medical concerns      Refer to Care Plan for Long Term Goals  SHORT TERM GOAL WEEK 1 PT Short Term Goal 1 (Week 1): Patient will perform sit to stand with min A PT Short Term Goal 2 (Week 1): Patient to ambulate with RW and min A x 120' PT Short Term Goal 3 (Week 1): Patient to negotiated 4 steps with rails and min A PT Short Term Goal 4 (Week 1): Patient to tolerate standing at least 5 minutes for functional task  Recommendations for other services: None   Skilled Therapeutic Intervention Patient in supine and reports feeling okay.  Performed upper body dressing with set up in supine.  Patient supine to sit to EOB with HOB elevated with S.  Performed lower body dressing looping legs into pants with CGA and sit to stand mod A to complete donning pants.  Patient seated for strength and coordination, sensory assessment.  Performed sit to stand with mod A and ambulated x 60' with RW and mod A with steppage pattern due to bilateral foot drop and assist for balance and safety.  Patient seated in w/c and propelled x 110' with min  A and cues due to  veering L.  Patient sit to stand at steps mod A and negotiated 3 (3") steps with rails and mod A cues for safety turning on steps and for foot placement.  Patient c/o nausea with HR 138 initially but recovered to 124 with seated rest and cool cloth.  Requesting to toilet so assisted in w/c to room and to bathroom.  Sit to stand using grabbar to stand step to toilet (3:1 over commode) with mod A and cues.  Patient completed toilet hygiene seated and sit to stand and donning pants mod A and stepped to w/c.  Assist to recliner again stand step with mod A.  Patient left in recliner with call bell in reach and seat alarm active, needs in reach.  Mobility Bed Mobility Bed Mobility: Supine to Sit Supine to Sit: Supervision/Verbal cueing Transfers Transfers: Sit to Stand;Stand to Sit Sit to Stand: Moderate Assistance - Patient 50-74% Stand to Sit: Moderate Assistance - Patient 50-74% Transfer (Assistive device): Rolling walker Locomotion  Gait Ambulation: Yes Gait Assistance: Moderate Assistance - Patient 50-74% Gait Distance (Feet): 60 Feet Assistive device: Rolling walker Gait Assistance Details: assist for balance, safety Gait Gait: Yes Gait Pattern: Impaired Gait Pattern: Scissoring;Ataxic;Step-through pattern;Decreased stride length;Decreased dorsiflexion - left;Decreased dorsiflexion - right;Right steppage;Left steppage Stairs / Additional Locomotion Stairs: Yes Stairs Assistance: Moderate Assistance - Patient 50 - 74% Stair Management Technique: Two rails;Step to pattern;Forwards Number of Stairs: 3 Height of Stairs: 3 Wheelchair Mobility Wheelchair Mobility: Yes Wheelchair Assistance: Minimal assistance - Patient >75% Wheelchair Propulsion: Both upper extremities Wheelchair Parts Management: Needs assistance Distance: 110'   Discharge Criteria: Patient will be discharged from PT if patient refuses treatment 3 consecutive times without medical reason, if treatment goals not met, if  there is a change in medical status, if patient makes no progress towards goals or if patient is discharged from hospital.  The above assessment, treatment plan, treatment alternatives and goals were discussed and mutually agreed upon: by patient  Jamison Oka, PT 08/16/2020, 12:26 PM

## 2020-08-16 NOTE — Plan of Care (Signed)
  Problem: RH Balance Goal: LTG Patient will maintain dynamic sitting balance (PT) Description: LTG:  Patient will maintain dynamic sitting balance with assistance during mobility activities (PT) Flowsheets (Taken 08/16/2020 1237) LTG: Pt will maintain dynamic sitting balance during mobility activities with:: Independent with assistive device  Goal: LTG Patient will maintain dynamic standing balance (PT) Description: LTG:  Patient will maintain dynamic standing balance with assistance during mobility activities (PT) Flowsheets (Taken 08/16/2020 1237) LTG: Pt will maintain dynamic standing balance during mobility activities with:: Supervision/Verbal cueing   Problem: Sit to Stand Goal: LTG:  Patient will perform sit to stand with assistance level (PT) Description: LTG:  Patient will perform sit to stand with assistance level (PT) Flowsheets (Taken 08/16/2020 1237) LTG: PT will perform sit to stand in preparation for functional mobility with assistance level: Supervision/Verbal cueing   Problem: RH Bed Mobility Goal: LTG Patient will perform bed mobility with assist (PT) Description: LTG: Patient will perform bed mobility with assistance, with/without cues (PT). Flowsheets (Taken 08/16/2020 1237) LTG: Pt will perform bed mobility with assistance level of: Independent with assistive device    Problem: RH Bed to Chair Transfers Goal: LTG Patient will perform bed/chair transfers w/assist (PT) Description: LTG: Patient will perform bed to chair transfers with assistance (PT). Flowsheets (Taken 08/16/2020 1237) LTG: Pt will perform Bed to Chair Transfers with assistance level: Supervision/Verbal cueing   Problem: RH Car Transfers Goal: LTG Patient will perform car transfers with assist (PT) Description: LTG: Patient will perform car transfers with assistance (PT). Flowsheets (Taken 08/16/2020 1237) LTG: Pt will perform car transfers with assist:: Supervision/Verbal cueing   Problem: RH Floor  Transfers Goal: LTG Patient will perform floor transfers w/assist (PT) Description: LTG: Patient will perform floor transfers with assistance (PT). Flowsheets (Taken 08/16/2020 1237) LTG: PT WILL PERFORM FLOOR TRANFERS  WITH  ASSIST:: Contact Guard/Touching assist   Problem: RH Ambulation Goal: LTG Patient will ambulate in controlled environment (PT) Description: LTG: Patient will ambulate in a controlled environment, # of feet with assistance (PT). Flowsheets (Taken 08/16/2020 1237) LTG: Pt will ambulate in controlled environ  assist needed:: Supervision/Verbal cueing LTG: Ambulation distance in controlled environment: 150' w/ LRAD Goal: LTG Patient will ambulate in home environment (PT) Description: LTG: Patient will ambulate in home environment, # of feet with assistance (PT). Flowsheets (Taken 08/16/2020 1237) LTG: Pt will ambulate in home environ  assist needed:: Supervision/Verbal cueing LTG: Ambulation distance in home environment: 68' w/ LRAD   Problem: RH Wheelchair Mobility Goal: LTG Patient will propel w/c in controlled environment (PT) Description: LTG: Patient will propel wheelchair in controlled environment, # of feet with assist (PT) Flowsheets (Taken 08/16/2020 1237) LTG: Pt will propel w/c in controlled environ  assist needed:: Supervision/Verbal cueing LTG: Propel w/c distance in controlled environment: 150'   Problem: RH Stairs Goal: LTG Patient will ambulate up and down stairs w/assist (PT) Description: LTG: Patient will ambulate up and down # of stairs with assistance (PT) Flowsheets (Taken 08/16/2020 1237) LTG: Pt will ambulate up/down stairs assist needed:: Supervision/Verbal cueing LTG: Pt will  ambulate up and down number of stairs: 4 w/ rails  Sheran Lawless, PT

## 2020-08-16 NOTE — Progress Notes (Signed)
Patient ID: Harold Flores, male   DOB: 1990/07/09, 30 y.o.   MRN: [id number] Met with the patient to review role of the nurse CM and address educational needs. Reviewed collaboration with the SW to facilitate prep for discharge. Patient given information on HTN management, DASH diet smoking cessation and reviewed dietary supplements for hypokalemia, hypothiamine and folate and hypomagnesium. No other questions or concerns noted at present. Continue to follow along to discharge to address educational needs Margarito Liner

## 2020-08-16 NOTE — Progress Notes (Signed)
[facility] PHYSICAL MEDICINE & REHABILITATION PROGRESS NOTE  Subjective/Complaints: Patient seen sitting up at the edge of his bed this morning.  Good sitting balance noted.  He states he slept well overnight.  He is working with therapies.  ROS: Denies CP, SOB, N/V/D  Objective: Vital Signs: Blood pressure 133/85, pulse 100, temperature 99 F (37.2 C), temperature source Oral, resp. rate 16, height 5\' 10"  (1.778 m), weight 77.9 kg, SpO2 100 %. No results found. Recent Labs    08/15/20 0235 08/16/20 0607  WBC 5.4 4.9  HGB 8.5* 8.5*  HCT 24.8* 24.5*  PLT 339 371   Recent Labs    08/15/20 0235 08/16/20 0607  NA 132* 132*  K 4.4 4.0  CL 102 100  CO2 22 24  GLUCOSE 88 95  BUN 6 6  CREATININE 0.84 0.68  CALCIUM 9.0 9.0    Intake/Output Summary (Last 24 hours) at 08/16/2020 1053 Last data filed at 08/16/2020 0900 Gross per 24 hour  Intake 440 ml  Output 900 ml  Net -460 ml        Physical Exam: BP 133/85 (BP Location: Left Arm)   Pulse 100   Temp 99 F (37.2 C) (Oral)   Resp 16   Ht 5\' 10"  (1.778 m)   Wt 77.9 kg   SpO2 100%   BMI 24.64 kg/m  Constitutional: No distress . Vital signs reviewed. HENT: Normocephalic.  Atraumatic. Eyes: EOMI. No discharge. Cardiovascular: No JVD.  RRR. Respiratory: Normal effort.  No stridor.  Bilateral clear to auscultation. GI: Non-distended.  BS +. Skin: Warm and dry.  Intact. Psych: Normal mood.  Normal behavior. Musc: No edema in extremities.  No tenderness in extremities. Neuro: Alert Motor:  Bilateral UE: 4+/5 proximal to distal Bilateral lower extremities: Hip flexion, knee extension 4-/5, ankle dorsiflexion 0/5 Sensation diminished to light touch bilateral feet skin:  Assessment/Plan: 1. Functional deficits which require 3+ hours per day of interdisciplinary therapy in a comprehensive inpatient rehab setting.  Physiatrist is providing close team supervision and 24 hour management of active medical problems listed  below.  Physiatrist and rehab team continue to assess barriers to discharge/monitor patient progress toward functional and medical goals   Care Tool:  Bathing              Bathing assist       Upper Body Dressing/Undressing Upper body dressing   What is the patient wearing?: Pull over shirt    Upper body assist      Lower Body Dressing/Undressing Lower body dressing      What is the patient wearing?: Pants     Lower body assist       Toileting Toileting    Toileting assist Assist for toileting: Independent (urinal)     Transfers Chair/bed transfer  Transfers assist           Locomotion Ambulation   Ambulation assist              Walk 10 feet activity   Assist           Walk 50 feet activity   Assist           Walk 150 feet activity   Assist           Walk 10 feet on uneven surface  activity   Assist           Wheelchair     Assist  Wheelchair 50 feet with 2 turns activity    Assist            Wheelchair 150 feet activity     Assist           Medical Problem List and Plan: 1.  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 Please see conference note from today as well.   B/l PRAFOs ordered 2.  Antithrombotics: -DVT/anticoagulation:  Pharmaceutical: Lovenox             -antiplatelet therapy: N/A 3. Pain Management: Gabapentin tid 4. Mood: Team to provide ego support.              -antipsychotic agents: N/A 5. Neuropsych: This patient is not fully capable of making decisions on his own behalf. 6. Skin/Wound Care: Routine pressure relief measures  7. Fluids/Electrolytes/Nutrition: Monitor  I/Os 8. HTN: Monitor BP tid--continue Norvasc and coreg bid. .  Monitor with increased mobility 9. HIV/AIDS: Now on Bitarvy (has been approved for assistance) with Bactrim daily for prophylaxis.             --Imodium prn for diarrhea 10. ETOH abuse with folate/thiamine deficiency:  Completed IV doses of folic acid/Thiamine              On folic acid ane thiamine for supplement.   Counsel 11. Hypomagnesemia:              Received 2 gram IV doses--03/04, 03/06 and 03/8.              Now on daily po supplement, changed on 3/9.   Mag 1.5 on 3/9 12. GAD/depression: On Zoloft 100 mg bid with low dose ativan bid prn.  13. B/L foot drops- suggest prevalon boots at night- doesn't want PRAFOs- thought they might be too heavy; also needs AFO consults for B/L foot drop.  14 Encephalopathy- sounds like resolved- not sure needs SLP. 15.  Hyponatremia  Sodium 132 on 3/9  Continue to monitor 16.  Transaminitis  AST elevated on 3/9  Continue to monitor  LOS: 1 days A FACE TO FACE EVALUATION WAS PERFORMED  Scottie Stanish Karis Juba 08/16/2020, 10:53 AM

## 2020-08-16 NOTE — Evaluation (Signed)
Speech Language Pathology Assessment and Plan  Patient Details  Name: Harold Flores MRN: [id number] Date of Birth: 01-21-1991  SLP Diagnosis:   N/A Rehab Potential:  N/A ELOS:   N/A   Today's Date: 08/16/2020 SLP Individual Time: 8144-8185 SLP Individual Time Calculation (min): 59 min   Hospital Problem: Principal Problem:   Progressive focal motor weakness  Past Medical History:  Past Medical History:  Diagnosis Date  . Anal fissure   . Anal fistula   . Anal ulcer   . Asthma   . Epistaxis   . GAD (generalized anxiety disorder)   . GERD (gastroesophageal reflux disease)   . Hemorrhoid   . HIV (human immunodeficiency virus infection) ([place])   . Hypertension   . Syphilis   . Varicella    Past Surgical History:  Past Surgical History:  Procedure Laterality Date  . EYE SURGERY  1994   blephroplasty right eye  . RADIOLOGY WITH ANESTHESIA N/A 08/08/2020   Procedure: MRI WITH ANESTHESIA- BRAIN WITH AND WITHOUT CONTRAST,  CERVICAL SPINE WITH AND WITHOUT CONTRAST, LUMBAR SPINE WITH WITHOUT CONTRAST, THORACIC SPINE WITH WITHOUT CONTRAST;  Surgeon: Radiologist, Medication, MD;  Location: [facility];  Service: Radiology;  Laterality: N/A;    Assessment / Plan / Recommendation Clinical Impression   Celeste Candelas is a 30 year old male with history of ETOH abuse, syphilis, HIV/AIDS (did not follow up with ID due to GAD), BLE edema (started on lasix 2/10 as refused to go to ED) who was admitted on 08/06/20 with BLE weakness with difficulty walking, paraesthesia, decreased appetite with loose stools, worsening of confusion with hallucinations. He was found to have significant hypokalemia with K-2.0 and elevated troponin felt to be multifactorial due to LVH, tachycardia and anemia. Dr. Curly Shores consulted for input and expressed concerns of opportunistic infection and full work up initiated. MRI brain and spine done and was negative for acute abnormality or abnormal cord signal.  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  Folate low -3.4 and Thiamine low-31.9.   He was treated with IVIG due to suspicion of GBS, high doses of IV B1 and folic acid due to concerns of folic acid/thiamine deficiency causing encephalopathy/confusion as well as IV ganciclovir for empiric CMV polyradiculopathy. 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  LP negative for JC virus, EGV, VSZ, VDRL, toxoplasma and CMV--mycobacterium TB  pending.  He has had some improvement in BLE strength but continues to have BLE instability, ataxic gait with tendency to loose balance posteriorly as well as waxing and waning of mentation. CIR recommended due to functional decline.    Pt presents with functional cognitive-linguistic skills. SLP administered higher level formal cognitive linguistic assessment, CLQT in which pt scored WFL on all subsections. Pt reported initial decline in cognition when admitted but supports skills are back to baseline. Pt demonstrated alternating and divided attention while consuming late breakfast tray during the assessment and demonstrated anticipatory awareness given physical acute deficits. Pt presents with speech WFL and swallow function on least restrictive diet, regular and thin liquids appeared WFL during consumption of meal. No further ST services needed due to being at baseline cognitive function.    Skilled Therapeutic Interventions          SLP facilitated administration of cognitive linguistic formal assessment and provided education of results. All questions answered to satisfaction.  Pt was left  in room with call bell within reach and bed alarm set. No further ST services needed.    SLP Assessment  Patient does not need any further Speech Lanaguage Pathology Services    Recommendations  SLP Diet Recommendations: Age appropriate regular  solids;Thin Liquid Administration via: Cup;Straw Medication Administration: Whole meds with liquid Supervision: Patient able to self feed Postural Changes and/or Swallow Maneuvers: Seated upright 90 degrees Oral Care Recommendations: Oral care BID Recommendations for Other Services: Neuropsych consult Patient destination: Home Follow up Recommendations: None    SLP Frequency   N/A  SLP Duration  SLP Intensity  SLP Treatment/Interventions  N/A   N/A    N/A   Pain Pain Assessment Pain Scale: 0-10 Pain Score: 0-No pain Pain Type: Acute pain Pain Location: Foot Pain Orientation: Right;Left Pain Descriptors / Indicators: Tingling Pain Frequency: Intermittent Pain Onset: On-going Pain Intervention(s): Distraction  Prior Functioning Cognitive/Linguistic Baseline: Within functional limits Type of Home: Apartment  Lives With: Family Available Help at Discharge: Family;Available PRN/intermittently Vocation: Part time employment  SLP Evaluation Cognition Overall Cognitive Status: Within Functional Limits for tasks assessed Orientation Level: Oriented X4 Attention: Alternating;Divided Alternating Attention: Appears intact Divided Attention: Appears intact Memory: Appears intact Awareness: Appears intact Problem Solving: Appears intact Safety/Judgment: Appears intact  Comprehension Auditory Comprehension Overall Auditory Comprehension: Appears within functional limits for tasks assessed Expression Expression Primary Mode of Expression: Verbal Verbal Expression Overall Verbal Expression: Appears within functional limits for tasks assessed Oral Motor Oral Motor/Sensory Function Overall Oral Motor/Sensory Function: Within functional limits Motor Speech Overall Motor Speech: Appears within functional limits for tasks assessed Respiration: Within functional limits  Care Tool Care Tool Cognition Expression of Ideas and Wants Expression of Ideas and Wants: Without  difficulty (complex and basic) - expresses complex messages without difficulty and with speech that is clear and easy to understand   Understanding Verbal and Non-Verbal Content Understanding Verbal and Non-Verbal Content: Understands (complex and basic) - clear comprehension without cues or repetitions   Memory/Recall Ability *first 3 days only Memory/Recall Ability *first 3 days only: Location of own room;That he or she is in a hospital/hospital unit;Current season     Short Term Goals: No short term goals set  Refer to Care Plan for Long Term Goals  Recommendations for other services: Neuropsych  Discharge Criteria: Patient will be discharged from SLP if patient refuses treatment 3 consecutive times without medical reason, if treatment goals not met, if there is a change in medical status, if patient makes no progress towards goals or if patient is discharged from hospital.  The above assessment, treatment plan, treatment alternatives and goals were discussed and mutually agreed upon: by patient  Fronia Depass  [facility] 08/16/2020, 11:39 AM

## 2020-08-16 NOTE — Evaluation (Signed)
Occupational Therapy Assessment and Plan  Patient Details  Name: Harold Flores MRN: [id number] Date of Birth: 02/07/1991  OT Diagnosis: muscle weakness (generalized) and BLE weakness, BUE/BLE sensory impairments, decreased actiivty tolerance Rehab Potential: Rehab Potential (ACUTE ONLY): Good ELOS: 2 to 2.5 weeks   Today's Date: 08/16/2020 OT Individual Time: 0814-4818 OT Individual Time Calculation (min): 54 min     Hospital Problem: Principal Problem:   Progressive focal motor weakness Active Problems:   Transaminitis   ETOH abuse   Past Medical History:  Past Medical History:  Diagnosis Date  . Anal fissure   . Anal fistula   . Anal ulcer   . Asthma   . Epistaxis   . GAD (generalized anxiety disorder)   . GERD (gastroesophageal reflux disease)   . Hemorrhoid   . HIV (human immunodeficiency virus infection) ([place])   . Hypertension   . Syphilis   . Varicella    Past Surgical History:  Past Surgical History:  Procedure Laterality Date  . EYE SURGERY  1994   blephroplasty right eye  . RADIOLOGY WITH ANESTHESIA N/A 08/08/2020   Procedure: MRI WITH ANESTHESIA- BRAIN WITH AND WITHOUT CONTRAST,  CERVICAL SPINE WITH AND WITHOUT CONTRAST, LUMBAR SPINE WITH WITHOUT CONTRAST, THORACIC SPINE WITH WITHOUT CONTRAST;  Surgeon: Radiologist, Medication, MD;  Location: [facility];  Service: Radiology;  Laterality: N/A;    Assessment & Plan Clinical Impression: Patient is a 30 y.o. year old male with history of ETOH abuse, syphilis, HIV/AIDS (did not follow up with ID due to GAD), BLE edema (started on lasix 2/10 as refused to go to ED) who was admitted on 08/06/20 with BLE weakness with difficulty walking, paraesthesia, decreased appetite with loose stools, worsening of confusion with hallucinations. He was found to have significant hypokalemia with K-2.0 and elevated troponin felt to be multifactorial due to LVH, tachycardia and anemia. Dr. Curly Shores consulted for input and expressed concerns  of opportunistic infection and full work up initiated. MRI brain and spine done and was negative for acute abnormality or abnormal cord signal.  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  Folate low -3.4 and Thiamine low-31.9.   He was treated with IVIG due to suspicion of GBS, high doses of IV B1 and folic acid due to concerns of folic acid/thiamine deficiency causing encephalopathy/confusion as well as IV ganciclovir for empiric CMV polyradiculopathy. 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  LP negative for JC virus, EGV, VSZ, VDRL, toxoplasma and CMV--mycobacterium TB  pending.  He has had some improvement in BLE strength but continues to have BLE instability, ataxic gait with tendency to loose balance posteriorly as well as waxing and waning of mentation. CIR recommended due to functional decline.   Patient transferred to CIR on 08/15/2020 .    Patient currently requires mod with basic self-care skills secondary to muscle weakness, decreased cardiorespiratoy endurance and impaired timing and sequencing, abnormal tone, unbalanced muscle activation, motor apraxia, ataxia and decreased coordination.  Prior to hospitalization, patient could complete BADL/IADL/func mobility with independent .  Patient will benefit from skilled intervention to increase independence with basic self-care skills and increase level of independence with iADL prior to discharge home with care partner.  Anticipate patient will require intermittent supervision and follow up home health.  OT - End of Session Activity Tolerance: Tolerates 10 - 20 min activity with multiple rests Endurance Deficit: Yes  Endurance Deficit Description: fatigues quickly, req w/c transport between bathroom and bed OT Assessment Rehab Potential (ACUTE ONLY): Good OT Patient demonstrates impairments in the  following area(s): Balance;Endurance;Motor;Pain;Skin Integrity;Sensory OT Basic ADL's Functional Problem(s): Eating;Grooming;Toileting;Bathing;Dressing OT Advanced ADL's Functional Problem(s): Simple Meal Preparation;Light Housekeeping OT Transfers Functional Problem(s): Toilet;Tub/Shower OT Additional Impairment(s): None OT Plan OT Intensity: Minimum of 1-2 x/day, 45 to 90 minutes OT Frequency: 5 out of 7 days OT Duration/Estimated Length of Stay: 2 to 2.5 weeks OT Treatment/Interventions: Balance/vestibular training;Disease mangement/prevention;Neuromuscular re-education;Self Care/advanced ADL retraining;Therapeutic Exercise;Wheelchair propulsion/positioning;UE/LE Strength taining/ROM;Skin care/wound managment;Pain management;DME/adaptive equipment instruction;Functional electrical stimulation;Community reintegration;Patient/family education;Splinting/orthotics;UE/LE Coordination activities;Therapeutic Activities;Psychosocial support;Functional mobility training;Discharge planning;Cognitive remediation/compensation OT Self Feeding Anticipated Outcome(s): ind OT Basic Self-Care Anticipated Outcome(s): mod I OT Toileting Anticipated Outcome(s): mod I OT Bathroom Transfers Anticipated Outcome(s): S OT Recommendation Recommendations for Other Services: Therapeutic Recreation consult;Neuropsych consult Therapeutic Recreation Interventions: Pet therapy;Kitchen group;Stress management;Outing/community reintergration Patient destination: Home Follow Up Recommendations: Home health OT Equipment Recommended: 3 in 1 bedside comode;Tub/shower bench   OT Evaluation Precautions/Restrictions  Precautions Precautions: Fall Precaution Comments: falls, B knee buckling Restrictions Weight Bearing Restrictions: No General Chart Reviewed: Yes Family/Caregiver Present: No Vital Signs Therapy Vitals Temp: 98 F (36.7 C) Pulse Rate: 100 Resp: 16 BP: 129/84 Patient Position (if appropriate):  Sitting Oxygen Therapy SpO2: 100 % O2 Device: Room Air Pain Pain Assessment Pain Score: 0-No pain Home Living/Prior Functioning Home Living Living Arrangements: Parent Available Help at Discharge: Family,Available PRN/intermittently Type of Home: Apartment Home Access: Level entry Home Layout: One level Bathroom Shower/Tub: Tub/shower unit (grab bar) Biochemist, clinical: Standard  Lives With: Family IADL History Homemaking Responsibilities: Yes Meal Prep Responsibility: Therapist, occupational Responsibility: Primary Cleaning Responsibility: Primary Bill Paying/Finance Responsibility: Primary Shopping Responsibility: Primary Child Care Responsibility: No Leisure and Hobbies: spending time with family, his cats Prior Function Level of Independence: Independent with transfers,Independent with homemaking with ambulation,Independent with basic ADLs,Independent with gait Driving: Yes 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, multimedia Comments: 5 falls in 2 weeks prior to admission Vision Baseline Vision/History: No visual deficits Patient Visual Report: No change from baseline Vision Assessment?: No apparent visual deficits Perception  Perception: Within Functional Limits Praxis Praxis: Intact Cognition Overall Cognitive Status: Within Functional Limits for tasks assessed Arousal/Alertness: Awake/alert Orientation Level: Person;Place;Situation Person: Oriented Place: Oriented Situation: Oriented Year: 2022 Month: March Day of Week: Correct Memory: Appears intact Immediate Memory Recall: Sock;Blue;Bed Memory Recall Sock: Not able to recall Memory Recall Blue: Without Cue Memory Recall Bed: Without Cue Attention: Alternating;Divided Divided Attention: Appears intact Awareness: Appears intact Problem Solving: Appears intact Safety/Judgment: Appears intact Sensation Sensation Light Touch: Impaired Detail Peripheral sensation comments: unable to identify LT  to B toes, able to identify LT to B digits but reports increased N/T in hands Light Touch Impaired Details: Impaired LLE;Impaired RLE Hot/Cold: Appears Intact Proprioception: Impaired by gross assessment Stereognosis: Appears Intact Additional Comments: decreased in hands and feet Coordination Gross Motor Movements are Fluid and Coordinated: No Fine Motor Movements are Fluid and Coordinated: No Coordination and Movement Description: slowed coordination with hands/digit-thumb opposition but grossly WFL for [facility] tasks; ataxic in LE's Motor  Motor Motor: Ataxia Motor - Skilled Clinical Observations: ataxic and weak more distally with foot drop bilaterally  Trunk/Postural Assessment  Cervical Assessment Cervical Assessment: Within Functional Limits Thoracic Assessment Thoracic Assessment: Within Functional Limits Lumbar Assessment Lumbar Assessment: Within Functional Limits Postural Control Postural Control: Deficits on evaluation Trunk Control: ataxic with mobility/ADL's  Balance Balance Balance Assessed: Yes Static Sitting Balance Static  Sitting - Balance Support: Feet supported Static Sitting - Level of Assistance: 5: Stand by assistance Dynamic Sitting Balance Dynamic Sitting - Level of Assistance: 5: Stand by assistance Dynamic Sitting Balance - Compensations: assist for safety reaching for feet Dynamic Sitting - Balance Activities: Forward lean/weight shifting;Reaching for objects;Reaching across midline;Lateral lean/weight shifting Static Standing Balance Static Standing - Balance Support: Bilateral upper extremity supported Static Standing - Level of Assistance: 4: Min assist Static Standing - Comment/# of Minutes: BUE support with RW Dynamic Standing Balance Dynamic Standing - Balance Support: Bilateral upper extremity supported Dynamic Standing - Level of Assistance: 3: Mod assist Dynamic Standing - Balance Activities: Other (comment) Dynamic Standing - Comments:  shower/bed to chair transfer Extremity/Trunk Assessment RUE Assessment RUE Assessment: Within Functional Limits Active Range of Motion (AROM) Comments: 3/4 to full shoulder flexion General Strength Comments: 4+/5 grip strength, in shoulder/elbow flexion and ext LUE Assessment LUE Assessment: Within Functional Limits Active Range of Motion (AROM) Comments: 3/4 to full shouler flexion General Strength Comments: 4+/5 grip strength, in shoulder/elbow flexion and ext  Care Tool Care Tool Self Care Eating   Eating Assist Level: Set up assist    Oral Care    Oral Care Assist Level: Set up assist (seated)    Bathing   Body parts bathed by patient: Right arm;Left arm;Front perineal area;Chest;Abdomen;Right upper leg;Left upper leg;Right lower leg;Left lower leg;Face Body parts bathed by helper: Buttocks   Assist Level: Minimal Assistance - Patient > 75%    Upper Body Dressing(including orthotics)   What is the patient wearing?: Hospital gown only   Assist Level: Minimal Assistance - Patient > 75%    Lower Body Dressing (excluding footwear)   What is the patient wearing?: Pants      Putting on/Taking off footwear   What is the patient wearing?: Non-skid slipper socks Assist for footwear: Supervision/Verbal cueing       Care Tool Toileting Toileting activity   Assist for toileting: Moderate Assistance - Patient 50 - 74%     Care Tool Bed Mobility Roll left and right activity   Roll left and right assist level: Supervision/Verbal cueing    Sit to lying activity   Sit to lying assist level: Supervision/Verbal cueing    Lying to sitting edge of bed activity   Lying to sitting edge of bed assist level: Minimal Assistance - Patient > 75%     Care Tool Transfers Sit to stand transfer   Sit to stand assist level: Moderate Assistance - Patient 50 - 74%    Chair/bed transfer   Chair/bed transfer assist level: Moderate Assistance - Patient 50 - 74%     Toilet transfer    Assist Level: Moderate Assistance - Patient 50 - 74%     Care Tool Cognition Expression of Ideas and Wants Expression of Ideas and Wants: Without difficulty (complex and basic) - expresses complex messages without difficulty and with speech that is clear and easy to understand   Understanding Verbal and Non-Verbal Content Understanding Verbal and Non-Verbal Content: Understands (complex and basic) - clear comprehension without cues or repetitions   Memory/Recall Ability *first 3 days only Memory/Recall Ability *first 3 days only: Current season;Location of own room;Staff names and faces;That he or she is in a hospital/hospital unit    Refer to Care Plan for Long Term Goals  SHORT TERM GOAL WEEK 1 OT Short Term Goal 1 (Week 1): Pt will complete toilet transfer with min A + AE PRN OT Short Term Goal  2 (Week 1): Pt will complete grooming at sink in standing with min A for > 2 min with AE PRN. OT Short Term Goal 3 (Week 1): Pt will don pants with min A + AE PRN.  Recommendations for other services: Neuropsych and Therapeutic Recreation  Pet therapy, Kitchen group, Stress management and Outing/community reintegration   Skilled Therapeutic Intervention ADL ADL Eating: Set up Where Assessed-Eating: Wheelchair Grooming: Setup Where Assessed-Grooming: Sitting at sink;Wheelchair Upper Body Bathing: Supervision/safety Where Assessed-Upper Body Bathing: Shower Lower Body Bathing: Minimal assistance Where Assessed-Lower Body Bathing: Shower Upper Body Dressing: Minimal assistance Where Assessed-Upper Body Dressing: Edge of bed Lower Body Dressing: Moderate assistance Where Assessed-Lower Body Dressing: Edge of bed Toileting: Moderate assistance Where Assessed-Toileting: Glass blower/designer: Moderate assistance Toilet Transfer Method: Stand pivot Toilet Transfer Equipment: Bedside commode;Grab bars Social research officer, government: Moderate assistance Social research officer, government Method: Theatre stage manager: Manufacturing systems engineer  Bed Mobility Bed Mobility: Supine to Sit Supine to Sit: Supervision/Verbal cueing Transfers Sit to Stand: Moderate Assistance - Patient 50-74% Stand to Sit: Moderate Assistance - Patient 50-74%  Session Note: Pt received semi-reclined in bed, agreeable to OT eval. C/o of min B foot pain, but not req rx at this time. Reviewed role of CIR OT, evaluation process, ADL/func mobility retraining, goals for therapy, and safety plan. Evaluation completed as documented above with session focus on bed mobility, showering, dressing, and seated grooming. Supine > sitting EOB with use of bed features and close S. STS with RW and mod A to power up, noted BLE shaking in standing. Stand-pivot to w/c with min A for balance and to control descent. Stand-pivot to TTB from w/c same manner as previously. Showered full-body with min A to reach buttocks. Pt able to doff B socks close S and doff pants via lateral leans with close S. Donned hospital gown with min A to tie. Completed seated grooming with close S at sink. Pt c/o of B hand and BLE N/T, reports difficulty dropping things and impaired stereognosis although not noted during session. Stand-pivot back to back same manner as before. Sitting EOB > supine with close. Provided information sheet from rec therapy to allow personal pet visitation, with pt expressing great interest in this. Pt states his main goals for therapy are to walk again and to "gain a new perspective on life."  Pt left semi-reclined in bed with call bell in reach, bed alarm engaged, and all immediate needs met.    Discharge Criteria: Patient will be discharged from OT if patient refuses treatment 3 consecutive times without medical reason, if treatment goals not met, if there is a change in medical status, if patient makes no progress towards goals or if patient is discharged from hospital.  The above assessment, treatment plan, treatment  alternatives and goals were discussed and mutually agreed upon: by patient  Volanda Napoleon MS, OTR/L  08/16/2020, 3:14 PM

## 2020-08-16 NOTE — Plan of Care (Signed)
Problem: RH Balance Goal: LTG: Patient will maintain dynamic sitting balance (OT) Description: LTG:  Patient will maintain dynamic sitting balance with assistance during activities of daily living (OT) Flowsheets (Taken 08/16/2020 1521) LTG: Pt will maintain dynamic sitting balance during ADLs with: Independent with assistive device Goal: LTG Patient will maintain dynamic standing with ADLs (OT) Description: LTG:  Patient will maintain dynamic standing balance with assist during activities of daily living (OT)  Flowsheets (Taken 08/16/2020 1521) LTG: Pt will maintain dynamic standing balance during ADLs with: Supervision/Verbal cueing   Problem: Sit to Stand Goal: LTG:  Patient will perform sit to stand in prep for activites of daily living with assistance level (OT) Description: LTG:  Patient will perform sit to stand in prep for activites of daily living with assistance level (OT) Flowsheets (Taken 08/16/2020 1521) LTG: PT will perform sit to stand in prep for activites of daily living with assistance level: Independent with assistive device   Problem: RH Eating Goal: LTG Patient will perform eating w/assist, cues/equip (OT) Description: LTG: Patient will perform eating with assist, with/without cues using equipment (OT) Flowsheets (Taken 08/16/2020 1521) LTG: Pt will perform eating with assistance level of: Independent   Problem: RH Grooming Goal: LTG Patient will perform grooming w/assist,cues/equip (OT) Description: LTG: Patient will perform grooming with assist, with/without cues using equipment (OT) Flowsheets (Taken 08/16/2020 1521) LTG: Pt will perform grooming with assistance level of: Independent with assistive device    Problem: RH Bathing Goal: LTG Patient will bathe all body parts with assist levels (OT) Description: LTG: Patient will bathe all body parts with assist levels (OT) Flowsheets (Taken 08/16/2020 1521) LTG: Pt will perform bathing with assistance level/cueing:  Supervision/Verbal cueing   Problem: RH Dressing Goal: LTG Patient will perform upper body dressing (OT) Description: LTG Patient will perform upper body dressing with assist, with/without cues (OT). Flowsheets (Taken 08/16/2020 1521) LTG: Pt will perform upper body dressing with assistance level of: Independent Goal: LTG Patient will perform lower body dressing w/assist (OT) Description: LTG: Patient will perform lower body dressing with assist, with/without cues in positioning using equipment (OT) Flowsheets (Taken 08/16/2020 1521) LTG: Pt will perform lower body dressing with assistance level of: Supervision/Verbal cueing   Problem: RH Toileting Goal: LTG Patient will perform toileting task (3/3 steps) with assistance level (OT) Description: LTG: Patient will perform toileting task (3/3 steps) with assistance level (OT)  Flowsheets (Taken 08/16/2020 1521) LTG: Pt will perform toileting task (3/3 steps) with assistance level: Independent with assistive device   Problem: RH Simple Meal Prep Goal: LTG Patient will perform simple meal prep w/assist (OT) Description: LTG: Patient will perform simple meal prep with assistance, with/without cues (OT). Flowsheets (Taken 08/16/2020 1521) LTG: Pt will perform simple meal prep with assistance level of: Supervision/Verbal cueing   Problem: RH Light Housekeeping Goal: LTG Patient will perform light housekeeping w/assist (OT) Description: LTG: Patient will perform light housekeeping with assistance, with/without cues (OT). Flowsheets (Taken 08/16/2020 1521) LTG: Pt will perform light housekeeping with assistance level of: Supervision/Verbal cueing   Problem: RH Toilet Transfers Goal: LTG Patient will perform toilet transfers w/assist (OT) Description: LTG: Patient will perform toilet transfers with assist, with/without cues using equipment (OT) Flowsheets (Taken 08/16/2020 1521) LTG: Pt will perform toilet transfers with assistance level of:  Supervision/Verbal cueing   Problem: RH Tub/Shower Transfers Goal: LTG Patient will perform tub/shower transfers w/assist (OT) Description: LTG: Patient will perform tub/shower transfers with assist, with/without cues using equipment (OT) Flowsheets (Taken 08/16/2020 1521) LTG: Pt  will perform tub/shower stall transfers with assistance level of: Supervision/Verbal cueing

## 2020-08-16 NOTE — Progress Notes (Signed)
[facility]patient [facility] Individual Statement of Services  Patient Name:  Harold Flores  Date:  08/16/2020  Welcome to the [facility]patient [facility].  Our goal is to provide you with an individualized program based on your diagnosis and situation, designed to meet your specific needs.  With this comprehensive rehabilitation program, you will be expected to participate in at least 3 hours of rehabilitation therapies Monday-Friday, with modified therapy programming on the weekends.  Your rehabilitation program will include the following services:  Physical Therapy (PT), Occupational Therapy (OT), Speech Therapy (ST), 24 hour per day rehabilitation nursing, Therapeutic Recreaction (TR), Neuropsychology, Care Coordinator, Rehabilitation Medicine, Nutrition Services and Pharmacy Services  Weekly team conferences will be held on Wednesday to discuss your progress.  Your Inpatient Rehabilitation Care Coordinator will talk with you frequently to get your input and to update you on team discussions.  Team conferences with you and your family in attendance may also be held.  Expected length of stay: 2-2.5 weeks  Overall anticipated outcome: supervision-mod/i level  Depending on your progress and recovery, your program may change. Your Inpatient Rehabilitation Care Coordinator will coordinate services and will keep you informed of any changes. Your Inpatient Rehabilitation Care Coordinator's name and contact numbers are listed  below.  The following services may also be recommended but are not provided by the Inpatient Rehabilitation Center:    Home Health Rehabiltiation Services  Outpatient Rehabilitation Services  Vocational Rehabilitation   Arrangements will be made to provide these services after discharge if needed.  Arrangements include referral to agencies that provide these services.  Your insurance has been verified to be:  None Your primary doctor is:  None  Pertinent  information will be shared with your doctor and your insurance company.  Inpatient Rehabilitation Care Coordinator:  Dossie Der, [place] [phone] or Luna Glasgow  Information discussed with and copy given to patient by: Lucy [name], 08/16/2020, 10:07 AM

## 2020-08-16 NOTE — Progress Notes (Signed)
Inpatient Rehabilitation Care Coordinator Assessment and Plan Patient Details  Name: Harold Flores MRN: [id number] Date of Birth: 1990-07-08  Today's Date: 08/16/2020  Hospital Problems: Principal Problem:   Progressive focal motor weakness  Past Medical History:  Past Medical History:  Diagnosis Date  . Anal fissure   . Anal fistula   . Anal ulcer   . Asthma   . Epistaxis   . GAD (generalized anxiety disorder)   . GERD (gastroesophageal reflux disease)   . Hemorrhoid   . HIV (human immunodeficiency virus infection) (HCC)   . Hypertension   . Syphilis   . Varicella    Past Surgical History:  Past Surgical History:  Procedure Laterality Date  . EYE SURGERY  1994   blephroplasty right eye  . RADIOLOGY WITH ANESTHESIA N/A 08/08/2020   Procedure: MRI WITH ANESTHESIA- BRAIN WITH AND WITHOUT CONTRAST,  CERVICAL SPINE WITH AND WITHOUT CONTRAST, LUMBAR SPINE WITH WITHOUT CONTRAST, THORACIC SPINE WITH WITHOUT CONTRAST;  Surgeon: Radiologist, Medication, MD;  Location: [facility] OR;  Service: Radiology;  Laterality: N/A;   Social History:  reports that he has been smoking e-cigarettes and cigarettes. He has a 2.50 pack-year smoking history. He has never used smokeless tobacco. He reports current alcohol use of about 7.0 standard drinks of alcohol per week. He reports current drug use. Drug: Marijuana.  Family / Support Systems Marital Status: Married Patient Roles: Spouse,Parent Spouse/Significant Other: Harold Flores [phone] Other Supports: Harold Flores-mom [phone]-cell Anticipated Caregiver: mom-Harold Flores and spouse-Harold Flores Ability/Limitations of Caregiver: Mom has health issues and can only provide supervision level Caregiver Availability: 24/7 Family Dynamics: Close with parents and spouse. Pt has friends who will come by and check on him and make sure he has what he needs. He feels he has good supports  Social History Preferred language: English Religion: Baptist Cultural Background: No  issues Education: HS Read: Yes Write: Yes Employment Status: Employed Name of Employer: [employer] Return to Work Plans: Hopes to return likes this job Marine scientist Issues: No issues Guardian/Conservator: None-according to MD pt is not fully capable of making his own decisions while, will look toward his husband. Pt seems to making progress and becoming more capable-will address with MD   Abuse/Neglect Abuse/Neglect Assessment Can Be Completed: Yes Physical Abuse: Denies Verbal Abuse: Denies Sexual Abuse: Denies Exploitation of patient/patient's resources: Denies Self-Neglect: Denies  Emotional Status Pt's affect, behavior and adjustment status: Pt is motivated to do well and recover from this, it scared him when he couldn't move his legs. He feels he is fortuante and wants to take this opportunity to change his life habits and to better. Recent Psychosocial Issues: other health issues was non-compliant with meds and caring for himself prior to admission Psychiatric History: History of anxiety but was taking no meds PTA due to cost and not seeing MD. Will ask neuro-psych to see pt while here for coping and to address substance abuse issues Substance Abuse History: Vapes, Tobacco, THC and ETOH awar eof the health risks and plans on stopping this. Will continue to discuss resources and this while here  Patient / Family Perceptions, Expectations & Goals Pt/Family understanding of illness & functional limitations: Pt is able to explain his diagnosises and feels he is doing better. He wants to regain his independence and not have to rely upon his Mom. He talks with the MD daily and feels he understands his treatment plan going forward. Premorbid pt/family roles/activities: partner, son, employee, friend, etc Anticipated changes in roles/activities/participation: resume Pt/family expectations/goals: Pt states: "  I want to take care of myself before I leave here. I don't like asking  for help."  [employer]: None Premorbid Home Care/DME Agencies: None Transportation available at discharge: Partner and family Resource referrals recommended: Neuropsychology  Discharge Planning Living Arrangements: Parent Support Systems: Spouse/significant other,Parent,Friends/neighbors Type of Residence: Private residence Insurance Resources: Customer service manager Resources: [employer] Support Financial Screen Referred: Yes Living Expenses: Rent Money Management: Patient,Family Does the patient have any problems obtaining your medications?: Yes (Describe) (uninsured) Home Management: Mom Patient/Family Preliminary Plans: Return to Mom's home where he was staying prior to admission. She has fibromayalgia and needs to be careful but still independent and drives. Both Mom and pt's spouse will be assisting him at discharge. Care Coordinator Barriers to Discharge: Insurance for SNF coverage,Medication compliance Care Coordinator Anticipated Follow Up Needs: HH/OP  Clinical Impression Pleasant gentleman who is motivated to do well here and regain his independence. He has good supports via parents and spouse. He hopes to be mod/i by discharge. Aware of team evaluating him today and setting goals. Have placed on neuro-psych list t be seen while here and will work on PCP for follow up  Lucy [name] 08/16/2020, 10:05 AM

## 2020-08-16 NOTE — Patient Care Conference (Signed)
Inpatient RehabilitationTeam Conference and Plan of Care Update Date: 08/16/2020   Time: 11:43 AM    Patient Name: Harold Flores      Medical Record Number: [id number]  Date of Birth: 1991-01-28 Sex: Male         Room/Bed: 4W22C/4W22C-01 Payor Info: Payor: /    Admit Date/Time:  08/15/2020  5:24 PM  Primary Diagnosis:  Progressive focal motor weakness  Hospital Problems: Principal Problem:   Progressive focal motor weakness Active Problems:   Transaminitis   ETOH abuse    Expected Discharge Date: Expected Discharge Date: 08/31/20 (Initial evals pending; ELOS 2.5 weeks)  Team Members Present: Physician leading conference: Dr. Maryla Morrow Care Coodinator Present: Chana Bode, RN, BSN, CRRN;Becky Dupree, LCSW Nurse Present: Magdalen Spatz) Horatio, LPN PT Present: Sheran Lawless, PT OT Present: Towanda Malkin, OT PPS Coordinator present : Fae Pippin, [place], PT     Current Status/Progress Goal Weekly Team Focus  Bowel/Bladder   Pt is cont x2, LBM 08/14/20  Pt will remain cont of b/b  Q2h toileting/PRN   Swallow/Nutrition/ Hydration             ADL's             Mobility   mod A overall transfers, gait w/ RW 60', stairs with rails x 3 (3")  S overall  endurance, balance, coordination, strength, safety   Communication             Safety/Cognition/ Behavioral Observations  No ST services         Pain   pt c/o of bilateral leg pain. PRN medications effective  Pt will be free of pain  Assess pain qshift/prn   Skin   Pt has excoriated skin on the buttocks  Pt will be free of further breakdown and infection  Assess skin qshift/prn     Discharge Planning:  New evaluation today-home with Mom who has fibromyalgia but independent herself. His spouse is involved also   Team Discussion: Patient with uncontrolled chronic medical issues complicated by HTN and poor nutrition. Patient with ataxia, bilateral foot drop and reluctance to use PRAFO/AFOs. Activity  intolerance with tachycardia and anxiety.    Patient on target to meet rehab goals: Currently mod assist overall.  *See Care Plan and progress notes for long and short-term goals.   Revisions to Treatment Plan:   Teaching Needs: Transfers, toileting, medications, etc.   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- but can be cleqar is progressive focal weakness/motor and sensory changes  Barriers to Discharge: Medical stability;Other (comments);Decreased family/caregiver support  Barriers to Discharge Comments: Etoh abuse Possible Resolutions to [employer] Focus: Therapies, follow labs - Mag, counsel for Etoh abuse   Continued Need for Acute Rehabilitation Level of Care: The patient requires daily medical management by a physician with specialized training in physical medicine and rehabilitation for the following reasons: Direction of a multidisciplinary physical rehabilitation program to maximize functional independence : Yes Medical management of patient stability for increased activity during participation in an intensive rehabilitation regime.: Yes Analysis of laboratory values and/or radiology reports with any subsequent need for medication adjustment and/or medical intervention. : Yes   I attest that I was present, lead the team conference, and concur with the  assessment and plan of the team.   Pamelia Hoit 08/16/2020, 3:28 PM

## 2020-08-16 NOTE — Progress Notes (Signed)
Pt would like to have a smaller dose of the nicotine patch, he states it has been making his head hurt, and he took patch off that was given today. No other complications at this time.   Rayburn Ma

## 2020-08-17 DIAGNOSIS — R531 Weakness: Secondary | ICD-10-CM

## 2020-08-17 DIAGNOSIS — E871 Hypo-osmolality and hyponatremia: Secondary | ICD-10-CM

## 2020-08-17 DIAGNOSIS — I10 Essential (primary) hypertension: Secondary | ICD-10-CM

## 2020-08-17 DIAGNOSIS — R000 Tachycardia, unspecified: Secondary | ICD-10-CM

## 2020-08-17 DIAGNOSIS — F1010 Alcohol abuse, uncomplicated: Secondary | ICD-10-CM

## 2020-08-17 LAB — MAGNESIUM: Magnesium: 1.5 mg/dL — ABNORMAL LOW (ref 1.7–2.4)

## 2020-08-17 MED ORDER — MAGNESIUM SULFATE 4 GM/100ML IV SOLN
4.0000 g | Freq: Once | INTRAVENOUS | Status: AC
  Administered 2020-08-17: 4 g via INTRAVENOUS
  Filled 2020-08-17: qty 100

## 2020-08-17 NOTE — Progress Notes (Signed)
Physical Therapy Session Note  Patient Details  Name: Harold Flores MRN: [id number] Date of Birth: [birth year]  Today's Date: 08/17/2020 PT Individual Time: [phone] and [id number] PT Individual Time Calculation (min): 58 min and 43 min  Short Term Goals: Week 1:  PT Short Term Goal 1 (Week 1): Patient will perform sit to stand with min A PT Short Term Goal 2 (Week 1): Patient to ambulate with RW and min A x 120' PT Short Term Goal 3 (Week 1): Patient to negotiated 4 steps with rails and min A PT Short Term Goal 4 (Week 1): Patient to tolerate standing at least 5 minutes for functional task  Skilled Therapeutic Interventions/Progress Updates:   First session:  Pt presents supine in bed and agreeable to therapy.  MD in for short visit.  Pt able to don scrub top and bottom w/ supervision, from a long sitting position and then finished pulling up pants once standing in RW.  Pt required mod A and verbal cues to amb into BR , esp. safely negotiating ramped surface at entrance.  Pt required min A for stand to sit after managing clothing.  Pt continent of BM and urine in toilet.  Nursing notified and charting.  Pt adiaphoretic and given cold washcloth before amb to w/c.  BP taken seated at 112/86 and HR 123, standing at 100/60 HR 129, spoke w/ nursing and charting.  Pt performed LE there ex including calf raises, LAQs, hip flexion 3 x 10-15 w/ education for breathing techniques.  Pt required seated rest breaks during TE.  Pt then requested to use BR again.  Pt wheeled to BR for energy conservation and transferred w/c <> toilet w/ min A, requiring min A to manage clothing.  Pt again continent of bowel and bladder, nursing aware.  Pt transferred w/c > bed w/ RW and min A, sit to supine w/ self-movement of LEs.  Bed alarm on and all needs in reach.   Second session:  Pt presents supine and agreeable to participate w/ therapy.  Pt transfers sup to sit w/ supervision and then to EOB.  Pt transfers sit to  stand w/ min A and then sidesteps to w/c.  Pt negotiated w/c in room and to Dayroom w/ supervision and verbal cues.  Pt amb multiple trials w/ RW and min A up to 25' in Dayroom w/ noted steppage gait Bilaterally. Pt tolerated Nu-step 5' x 2 trials at Level 2 w/ pt expressing happiness performing.  Pt returned to room wheeling self into room.  Pt wishes to remain in w/c and being able to wheel around room.  Seat alarm on and pt to call for help when transferring BTB.  NT aware and will monitor.     Therapy Documentation Precautions:  Precautions Precautions: Fall Precaution Comments: falls, B knee buckling Restrictions Weight Bearing Restrictions: No General:   Vital Signs:   Pain: 0/10, feel off since quitting smoking.      Therapy/Group: Individual Therapy  Lucio Edward 08/17/2020, 9:02 AM

## 2020-08-17 NOTE — Evaluation (Signed)
Recreational Therapy Assessment and Plan  Patient Details  Name: Harold Flores MRN: [id number] Date of Birth: 14-Jan-1991 Today's Date: 08/17/2020  Rehab Potential:  Good ELOS:   2 weeks  Assessment  Hospital Problem: Principal Problem:   Progressive focal motor weakness Active Problems:   Transaminitis   ETOH abuse   Past Medical History:      Past Medical History:  Diagnosis Date  . Anal fissure   . Anal fistula   . Anal ulcer   . Asthma   . Epistaxis   . GAD (generalized anxiety disorder)   . GERD (gastroesophageal reflux disease)   . Hemorrhoid   . HIV (human immunodeficiency virus infection) ([place])   . Hypertension   . Syphilis   . Varicella    Past Surgical History:       Past Surgical History:  Procedure Laterality Date  . EYE SURGERY  1994   blephroplasty right eye  . RADIOLOGY WITH ANESTHESIA N/A 08/08/2020   Procedure: MRI WITH ANESTHESIA- BRAIN WITH AND WITHOUT CONTRAST,  CERVICAL SPINE WITH AND WITHOUT CONTRAST, LUMBAR SPINE WITH WITHOUT CONTRAST, THORACIC SPINE WITH WITHOUT CONTRAST;  Surgeon: Radiologist, Medication, MD;  Location: [facility];  Service: Radiology;  Laterality: N/A;    Assessment & Plan Clinical Impression: Patient is a 30 y.o. year old male with history of ETOH abuse, syphilis, HIV/AIDS (did not follow up with ID due to GAD), BLE edema (started on lasix 2/10 as refused to go to ED) who was admitted on 08/06/20 with BLE weakness with difficulty walking, paraesthesia, decreased appetite with loose stools, worsening of confusion with hallucinations. He was found to have significant hypokalemia with K-2.0 and elevated troponin felt to be multifactorial due to LVH, tachycardia and anemia. Dr. Curly [place] consulted for input and expressed concerns of opportunistic infection and full work up initiated. MRI brain and spine done and was negative for acute abnormality or abnormal cord signal. 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 Folate low -3.4 and Thiamine low-31.9.   He was treated with IVIG due to suspicion of GBS, high doses of IV B1 and folic acid due to concerns of folic acid/thiamine deficiency causing encephalopathy/confusion as well as IV ganciclovir for empiric CMV polyradiculopathy. 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 LP negative for JC virus, EGV, VSZ, VDRL, toxoplasma and CMV--mycobacterium TB pending. He has had some improvement in BLE strength but continues to have BLE instability, ataxic gait with tendency to loose balance posteriorly as well as waxing and waning of mentation. CIR recommended due to functional decline.Patient transferred to CIR on 08/15/2020 .    Pt presents with decreased activity tolerance, decreased coordination Limiting pt's independence with leisure/community pursuits.  Plan  Min 1 TR session >20 minutes per week during LOS  Recommendations for other services: None   Discharge Criteria: Patient will be discharged from TR if patient refuses treatment 3 consecutive times without medical reason.  If treatment goals not met, if there is a change in medical status, if patient makes no progress towards goals or if patient is discharged from hospital.  The above assessment, treatment plan, treatment alternatives and goals were discussed and mutually agreed upon: by patient  [place] 08/17/2020, 2:44 PM

## 2020-08-17 NOTE — Progress Notes (Signed)
[place] PHYSICAL MEDICINE & REHABILITATION PROGRESS NOTE  Subjective/Complaints: Patient seen sitting up in bed this morning.  He states he slept well overnight.  He states he had the first day of therapies yesterday.  He states that he was told that he had heart failure and has questions.  ROS: Denies CP, SOB, N/V/D  Objective: Vital Signs: Blood pressure 112/86, pulse (!) 110, temperature 98.5 F (36.9 C), resp. rate 14, height 5\' 10"  (1.778 m), weight 77.9 kg, SpO2 100 %. No results found. Recent Labs    08/15/20 0235 08/16/20 0607  WBC 5.4 4.9  HGB 8.5* 8.5*  HCT 24.8* 24.5*  PLT 339 371   Recent Labs    08/15/20 0235 08/16/20 0607  NA 132* 132*  K 4.4 4.0  CL 102 100  CO2 22 24  GLUCOSE 88 95  BUN 6 6  CREATININE 0.84 0.68  CALCIUM 9.0 9.0    Intake/Output Summary (Last 24 hours) at 08/17/2020 1126 Last data filed at 08/17/2020 1016 Gross per 24 hour  Intake 440 ml  Output 420 ml  Net 20 ml        Physical Exam: BP 112/86   Pulse (!) 110   Temp 98.5 F (36.9 C)   Resp 14   Ht 5\' 10"  (1.778 m)   Wt 77.9 kg   SpO2 100%   BMI 24.64 kg/m  Constitutional: No distress . Vital signs reviewed. HENT: Normocephalic.  Atraumatic. Eyes: EOMI. No discharge. Cardiovascular: No JVD.  RRR. Respiratory: Normal effort.  No stridor.  Bilateral clear to auscultation. GI: Non-distended.  BS +. Skin: Warm and dry.  Intact. Psych: Normal mood.  Normal behavior. Musc: No edema in extremities.  No tenderness in extremities. Neuro: Alert Motor:  Bilateral UE: 4+/5 proximal to distal Bilateral lower extremities: Hip flexion, knee extension 4-/5, ankle dorsiflexion 0/5, unchanged Sensation diminished to light touch bilateral feet  Assessment/Plan: 1. Functional deficits which require 3+ hours per day of interdisciplinary therapy in a comprehensive inpatient rehab setting.  Physiatrist is providing close team supervision and 24 hour management of active medical  problems listed below.  Physiatrist and rehab team continue to assess barriers to discharge/monitor patient progress toward functional and medical goals   Care Tool:  Bathing    Body parts bathed by patient: Right arm,Left arm,Front perineal area,Chest,Abdomen,Right upper leg,Left upper leg,Right lower leg,Left lower leg,Face   Body parts bathed by helper: Buttocks     Bathing assist Assist Level: Minimal Assistance - Patient > 75%     Upper Body Dressing/Undressing Upper body dressing   What is the patient wearing?: Hospital gown only    Upper body assist Assist Level: Minimal Assistance - Patient > 75%    Lower Body Dressing/Undressing Lower body dressing      What is the patient wearing?: Pants     Lower body assist       Toileting Toileting    Toileting assist Assist for toileting: Contact Guard/Touching assist     Transfers Chair/bed transfer  Transfers assist     Chair/bed transfer assist level: Moderate Assistance - Patient 50 - 74%     Locomotion Ambulation   Ambulation assist      Assist level: Moderate Assistance - Patient 50 - 74% Assistive device: Walker-rolling Max distance: 15   Walk 10 feet activity   Assist     Assist level: Moderate Assistance - Patient - 50 - 74% Assistive device: Walker-rolling   Walk 50 feet activity  Assist    Assist level: Moderate Assistance - Patient - 50 - 74% Assistive device: Walker-rolling    Walk 150 feet activity   Assist Walk 150 feet activity did not occur: Safety/medical concerns         Walk 10 feet on uneven surface  activity   Assist Walk 10 feet on uneven surfaces activity did not occur: Safety/medical concerns         Wheelchair     Assist Will patient use wheelchair at discharge?: No Type of Wheelchair: Manual    Wheelchair assist level: Minimal Assistance - Patient > 75% Max wheelchair distance: 110'    Wheelchair 50 feet with 2 turns  activity    Assist        Assist Level: Supervision/Verbal cueing   Wheelchair 150 feet activity     Assist  Wheelchair 150 feet activity did not occur: Safety/medical concerns        Medical Problem List and Plan: 1.  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  Antithrombotics: -DVT/anticoagulation:  Pharmaceutical: Lovenox             -antiplatelet therapy: N/A 3. Pain Management: Gabapentin tid 4. Mood: Team to provide ego support.              -antipsychotic agents: N/A 5. Neuropsych: This patient is not fully capable of making decisions on his own behalf. 6. Skin/Wound Care: Routine pressure relief measures  7. Fluids/Electrolytes/Nutrition: Monitor I/Os 8. HTN: Monitor BP tid--continue Norvasc and coreg bid.   Controlled on 3/10  Monitor with increased mobility 9. HIV/AIDS: Now on Bitarvy (has been approved for assistance) with Bactrim daily for prophylaxis.             --Imodium prn for diarrhea 10. ETOH abuse with folate/thiamine deficiency:  Completed IV doses of folic acid/Thiamine              On folic acid ane thiamine for supplement.   Counsel 11. Hypomagnesemia:              Received 2 gram IV doses--03/04, 03/06 and 03/8.              Now on daily po supplement, changed on 3/9.   IV magnesium 4 g ordered on 3/10  Mag 1.5 on 3/10 12. GAD/depression: On Zoloft 100 mg bid with low dose ativan bid prn.  13. B/L foot drops- suggest prevalon boots at night- doesn't want PRAFOs- thought they might be too heavy; also needs AFO consults for B/L foot drop.  14 Encephalopathy- sounds like resolved- not sure needs SLP. 15.  Hyponatremia  Sodium 132 on 3/9, labs ordered for tomorrow  Continue to monitor 16.  Transaminitis  AST elevated on 3/9  Continue to monitor 17.  Sinus tachycardia  Borderline on 3/10  Continue to monitor  LOS: 2  days A FACE TO FACE EVALUATION WAS PERFORMED  Shahla Betsill Karis Juba 08/17/2020, 11:26 AM

## 2020-08-17 NOTE — Progress Notes (Signed)
Orthopedic Tech Progress Note Patient Details:  Harold Flores 06-01-1991 [id number] Ordered brace Patient ID: Harold Flores, male   DOB: 05-Jun-1991, 30 y.o.   MRN: [id number]   Harold Flores 08/17/2020, 2:10 PM

## 2020-08-17 NOTE — Progress Notes (Signed)
Occupational Therapy Session Note  Patient Details  Name: Harold Flores MRN: [id number] Date of Birth: 1990-08-25  Today's Date: 08/17/2020 OT Individual Time: 4784-1282 OT Individual Time Calculation (min): 46 min    Short Term Goals: Week 1:  OT Short Term Goal 1 (Week 1): Pt will complete toilet transfer with min A + AE PRN OT Short Term Goal 2 (Week 1): Pt will complete grooming at sink in standing with min A for > 2 min with AE PRN. OT Short Term Goal 3 (Week 1): Pt will don pants with min A + AE PRN.   Skilled Therapeutic Interventions/Progress Updates:    Pt greeted at time of session supine in bed resting, feeling tired after PT session this am but agreeable to OT session. Supine > sit w/ Supervision and performed UB dressing seated with Set up and pants Min A at bed level, able to perform with lateral leans. Sit > stand Mod A and stand pivot to wheelchair with RW Mod A as well. Self propel to gym with Supervision and performed BITS standing x2 rounds for 2 and 1 mins respectively with Min A for standing balance fading to CGA with unilateral support on RW. Note very ataxic at first but improving with time. PA present as well to observe standing balance. Fatigued and mildly diaphoretic, O2 sats 100% and HR elevated to 119, rest break provided. Transport back to room and stand pivot to bed Min A. Note sit <> stands improved from Mod A initially to Min A with practice. Alarm on call bell in reach.   Therapy Documentation Precautions:  Precautions Precautions: Fall Precaution Comments: falls, B knee buckling Restrictions Weight Bearing Restrictions: No    Therapy/Group: Individual Therapy  Erasmo Score 08/17/2020, 7:26 AM

## 2020-08-18 LAB — BASIC METABOLIC PANEL
Anion gap: 7 (ref 5–15)
BUN: 11 mg/dL (ref 6–20)
CO2: 24 mmol/L (ref 22–32)
Calcium: 9.1 mg/dL (ref 8.9–10.3)
Chloride: 101 mmol/L (ref 98–111)
Creatinine, Ser: 0.7 mg/dL (ref 0.61–1.24)
GFR, Estimated: >60 mL/min (ref >60)
Glucose, Bld: 89 mg/dL (ref 70–99)
Potassium: 4.5 mmol/L (ref 3.5–5.1)
Sodium: 132 mmol/L — ABNORMAL LOW (ref 135–145)

## 2020-08-18 LAB — MAGNESIUM: Magnesium: 1.8 mg/dL (ref 1.7–2.4)

## 2020-08-18 NOTE — Progress Notes (Signed)
Occupational Therapy Session Note  Patient Details  Name: Harold Flores MRN: [id number] Date of Birth: 1990-12-16  Today's Date: 08/18/2020 OT Individual Time: 9211-9417 OT Individual Time Calculation (min): 60 min   Short Term Goals: Week 1:  OT Short Term Goal 1 (Week 1): Pt will complete toilet transfer with min A + AE PRN OT Short Term Goal 2 (Week 1): Pt will complete grooming at sink in standing with min A for > 2 min with AE PRN. OT Short Term Goal 3 (Week 1): Pt will don pants with min A + AE PRN.  Skilled Therapeutic Interventions/Progress Updates:    Pt greeted in bed, reporting some nerve pain in his hands and feet, premedicated for pain by RN at start of session. Pt describing his medical journey and personal obstacles that he is overcoming at this time. OT provided therapeutic listening and support. Education provided regarding the therapeutic effects of journaling. Pt had a journal in the room and OT removed wrapping paper from it, also provided him with a writing implement, encouraging prose, poetry, or just diary writing for psychosocial coping. Pt appreciative. He transitioned to EOB without assistance using the bedrail. Dressing completed at sit<stand level using RW, Min A for sit<stand and for dynamic standing balance while pt removed 1 UE at a time in order to fully elevate pants over hips. Pt able to draw each LE close to chest or use figure 4 position to change gripper socks. Min A for stand pivot<w/c using RW with vcs. While seated at the sink, pt engaged in several oral care/grooming tasks. Opportunities to stand provided with pt standing x1 given Min A for sit<stand. Min balance assistance in standing with unilateral support on the sink, vcs to not use the sink for standing support for LE strengthening. After pt donned 4 pillowcases onto pillows for his bed, pt transferred to the toilet, Mod A stand pivot using the grab bar. Pt, once again, able to remove 1 UE at a time to  manage clothing. Pt with continent BM. Setup for hygiene completion while seated before returning to the w/c. Min A for stand pivot back to bed, all needs within reach and bed alarm set at close of session. Tx focus placed on ADL retraining, dynamic balance, sit<stands, standing balance and LE strengthening during the functional context of ADL.   Therapy Documentation Precautions:  Precautions Precautions: Fall Precaution Comments: falls, B knee buckling Restrictions Weight Bearing Restrictions: No ADL: ADL Eating: Set up Where Assessed-Eating: Wheelchair Grooming: Setup Where Assessed-Grooming: Sitting at sink,Wheelchair Upper Body Bathing: Supervision/safety Where Assessed-Upper Body Bathing: Shower Lower Body Bathing: Minimal assistance Where Assessed-Lower Body Bathing: Shower Upper Body Dressing: Minimal assistance Where Assessed-Upper Body Dressing: Edge of bed Lower Body Dressing: Moderate assistance Where Assessed-Lower Body Dressing: Edge of bed Toileting: Moderate assistance Where Assessed-Toileting: Teacher, adult education: Moderate assistance Toilet Transfer Method: Stand pivot Toilet Transfer Equipment: Bedside commode,Grab bars Film/video editor: Moderate assistance Film/video editor Method: Warden/ranger: Emergency planning/management officer      Therapy/Group: Individual Therapy  Ketty Bitton A Maliya Marich 08/18/2020, 12:45 PM

## 2020-08-18 NOTE — Progress Notes (Signed)
Physical Therapy Session Note  Patient Details  Name: Harold Flores MRN: [id number] Date of Birth: 1990-07-23  Today's Date: 08/18/2020 PT Individual Time:Session1: 0910-1005; [place]: 8299-3716 PT Individual Time Calculation (min): 55 min & 40 min  Short Term Goals: Week 1:  PT Short Term Goal 1 (Week 1): Patient will perform sit to stand with min A PT Short Term Goal 2 (Week 1): Patient to ambulate with RW and min A x 120' PT Short Term Goal 3 (Week 1): Patient to negotiated 4 steps with rails and min A PT Short Term Goal 4 (Week 1): Patient to tolerate standing at least 5 minutes for functional task  Skilled Therapeutic Interventions/Progress Updates:    Session1:Patient in supine and reports had opportunity to wash up and feels good this morning.  Had returned to bed to rest.  Supine to sit with S.  Sit to stand with min A and ambulated x `115' with RW and min A with close w/c follow for safety.  Patient performed standing balance activity against the wall performing "wall bumps" to activate ankle musculature and for core strength. Performed activities in parallel bars to include side stepping over cones with bilateral UE support and cues for technique, step taps to cones with 2 then 1 UE support and CGA, and forward walking through cones for foot clearance and stride length.  Patient performed car transfer to simulated van height with RW and CGA.  Demonstrated and pt return demonstrated supine bridging and hip flexion in hooklying on mat and educated to perform for HEP over weekend. Patient assisetd in w/c to room and transferred with cues to EOB using bed rail.  Left in bed with alarm active and needs in reach.  Session2:  Patient in supine but had dressed and ready for PT.  Obtained green t-band and educated in plantarflexion strength with band at toes with pt performing x 10 on each foot with some cues for placement.  Also reviewed strengthening taught in earlier session.  Patient sit  to stand CGA and ambulated with CGA to min A with RW x 75'. Propelled w/c off unit, down elevator and out to brick patio with S and cues for thresholds.  Patient sit to stand to RW on brick with CGA, then ambulated with mod A with RW x 50' with assist to control walker and for preventing LOB.  Patient assisted in w/c up to unit and propelled to room from elevator.  Left up in w/c with seat alarm active and needs in reach.   Therapy Documentation Precautions:  Precautions Precautions: Fall Precaution Comments: falls, B knee buckling Restrictions Weight Bearing Restrictions: No Pain: Pain Assessment Pain Scale: Faces Faces Pain Scale: Hurts a little bit Pain Type: Acute pain Pain Location: Foot Pain Orientation: Right;Left Pain Descriptors / Indicators: Tingling Pain Onset: On-going Pain Intervention(s): Ambulation/increased activity   Therapy/Group: Individual Therapy  Elray Mcgregor  Sheran Lawless, PT 08/18/2020, 9:34 AM

## 2020-08-18 NOTE — Progress Notes (Signed)
Occupational Therapy Session Note  Patient Details  Name: Harold Flores MRN: [id number] Date of Birth: 21-Mar-1991  Today's Date: 08/18/2020 OT Group Time: 1115-1200 OT Group Time Calculation (min): 45 min    Short Term Goals: Week 1:  OT Short Term Goal 1 (Week 1): Pt will complete toilet transfer with min A + AE PRN OT Short Term Goal 2 (Week 1): Pt will complete grooming at sink in standing with min A for > 2 min with AE PRN. OT Short Term Goal 3 (Week 1): Pt will don pants with min A + AE PRN.  Skilled Therapeutic Interventions/Progress Updates:    Pt participated in rhythmic drumming group. Pain not rated or reported. Focus of group on BUE coordination, strengthening, endurance, timing/control, activity tolerance, and social participation and engagement. Pt performs session from seated position for energy conservation. Skilled interventions included focus on timing for coordination for ataxia as well as larger AROM during drumming movements.. Warm up performed prior to exercises and UB stretching completed at end of group with demo from OT. Pt able to select preferred song to share with group. Returned pt to room at end of session. Exited session with pt seated in w/c, exit alarm on and call light in reach  Therapy Documentation Precautions:  Precautions Precautions: Fall Precaution Comments: falls, B knee buckling Restrictions Weight Bearing Restrictions: No General:   Vital Signs: Therapy Vitals Temp: 98 F (36.7 C) Pulse Rate: 99 Resp: 16 BP: 124/74 Patient Position (if appropriate): Lying Oxygen Therapy SpO2: 100 % Pain:   ADL: ADL Eating: Set up Where Assessed-Eating: Wheelchair Grooming: Setup Where Assessed-Grooming: Sitting at sink,Wheelchair Upper Body Bathing: Supervision/safety Where Assessed-Upper Body Bathing: Shower Lower Body Bathing: Minimal assistance Where Assessed-Lower Body Bathing: Shower Upper Body Dressing: Minimal assistance Where  Assessed-Upper Body Dressing: Edge of bed Lower Body Dressing: Moderate assistance Where Assessed-Lower Body Dressing: Edge of bed Toileting: Moderate assistance Where Assessed-Toileting: Teacher, adult education: Moderate assistance Toilet Transfer Method: Stand pivot Toilet Transfer Equipment: Bedside commode,Grab bars Film/video editor: Moderate assistance Film/video editor Method: Warden/ranger: Manufacturing systems engineer    Praxis   Exercises:   Other Treatments:     Therapy/Group: Group Therapy  Shon Hale 08/18/2020, 6:52 AM

## 2020-08-18 NOTE — Progress Notes (Signed)
[place] PHYSICAL MEDICINE & REHABILITATION PROGRESS NOTE  Subjective/Complaints: Patient seen sitting up in the chair this morning working with therapies.  He states he slept well overnight.  He states his father is coming in later this morning to bring him close. He states he likes his PRAFOs.  ROS: Denies CP, SOB, N/V/D  Objective: Vital Signs: Blood pressure 132/88, pulse 97, temperature 98 F (36.7 C), resp. rate 16, height 5\' 10"  (1.778 m), weight 77.9 kg, SpO2 100 %. No results found. Recent Labs    08/16/20 0607  WBC 4.9  HGB 8.5*  HCT 24.5*  PLT 371   Recent Labs    08/16/20 0607  NA 132*  K 4.0  CL 100  CO2 24  GLUCOSE 95  BUN 6  CREATININE 0.68  CALCIUM 9.0    Intake/Output Summary (Last 24 hours) at 08/18/2020 10/18/2020 Last data filed at 08/18/2020 0400 Gross per 24 hour  Intake 270 ml  Output 970 ml  Net -700 ml        Physical Exam: BP 132/88   Pulse 97   Temp 98 F (36.7 C)   Resp 16   Ht 5\' 10"  (1.778 m)   Wt 77.9 kg   SpO2 100%   BMI 24.64 kg/m  Constitutional: No distress . Vital signs reviewed. HENT: Normocephalic.  Atraumatic. Eyes: EOMI. No discharge. Cardiovascular: No JVD.  RRR. Respiratory: Normal effort.  No stridor.  Bilateral clear to auscultation. GI: Non-distended.  BS +. Skin: Warm and dry.  Intact. Psych: Normal mood.  Normal behavior. Musc: No edema in extremities.  No tenderness in extremities. Neuro: Alert Motor:  Bilateral UE: 4+/5 proximal to distal Bilateral lower extremities: Hip flexion, knee extension 4-/5, ankle dorsiflexion 0/5, stable Sensation diminished to light touch bilateral feet  Assessment/Plan: 1. Functional deficits which require 3+ hours per day of interdisciplinary therapy in a comprehensive inpatient rehab setting.  Physiatrist is providing close team supervision and 24 hour management of active medical problems listed below.  Physiatrist and rehab team continue to assess barriers to  discharge/monitor patient progress toward functional and medical goals   Care Tool:  Bathing    Body parts bathed by patient: Right arm,Left arm,Front perineal area,Chest,Abdomen,Right upper leg,Left upper leg,Right lower leg,Left lower leg,Face   Body parts bathed by helper: Buttocks     Bathing assist Assist Level: Minimal Assistance - Patient > 75%     Upper Body Dressing/Undressing Upper body dressing   What is the patient wearing?: Pull over shirt    Upper body assist Assist Level: Set up assist    Lower Body Dressing/Undressing Lower body dressing      What is the patient wearing?: Pants     Lower body assist Assist for lower body dressing: Minimal Assistance - Patient > 75%     Toileting Toileting    Toileting assist Assist for toileting: Minimal Assistance - Patient > 75%     Transfers Chair/bed transfer  Transfers assist     Chair/bed transfer assist level: Minimal Assistance - Patient > 75%     Locomotion Ambulation   Ambulation assist      Assist level: Moderate Assistance - Patient 50 - 74% Assistive device: Walker-rolling Max distance: 25   Walk 10 feet activity   Assist     Assist level: Moderate Assistance - Patient - 50 - 74% Assistive device: Walker-rolling   Walk 50 feet activity   Assist    Assist level: Moderate Assistance - Patient -  50 - 74% Assistive device: Walker-rolling    Walk 150 feet activity   Assist Walk 150 feet activity did not occur: Safety/medical concerns         Walk 10 feet on uneven surface  activity   Assist Walk 10 feet on uneven surfaces activity did not occur: Safety/medical concerns         Wheelchair     Assist Will patient use wheelchair at discharge?: No Type of Wheelchair: Manual    Wheelchair assist level: Contact Guard/Touching assist,Supervision/Verbal cueing Max wheelchair distance: 110    Wheelchair 50 feet with 2 turns activity    Assist         Assist Level: Supervision/Verbal cueing   Wheelchair 150 feet activity     Assist  Wheelchair 150 feet activity did not occur: Safety/medical concerns        Medical Problem List and Plan: 1.  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  Antithrombotics: -DVT/anticoagulation:  Pharmaceutical: Lovenox             -antiplatelet therapy: N/A 3. Pain Management: Gabapentin tid 4. Mood: Team to provide ego support.              -antipsychotic agents: N/A 5. Neuropsych: This patient is not fully capable of making decisions on his own behalf. 6. Skin/Wound Care: Routine pressure relief measures  7. Fluids/Electrolytes/Nutrition: Monitor I/Os 8. HTN: Monitor BP tid--continue Norvasc and coreg bid.   Controlled on 3/11  Monitor with increased mobility 9. HIV/AIDS: Now on Bitarvy (has been approved for assistance) with Bactrim daily for prophylaxis.             --Imodium prn for diarrhea 10. ETOH abuse with folate/thiamine deficiency:  Completed IV doses of folic acid/Thiamine              On folic acid ane thiamine for supplement.   Counsel 11. Hypomagnesemia:              Received 2 gram IV doses--03/04, 03/06 and 03/8.              Now on daily po supplement, changed on 3/9.   IV magnesium 4 g ordered on 3/10  Mag 1.8 on 3/11, labs ordered for Monday 12. GAD/depression: On Zoloft 100 mg bid with low dose ativan bid prn.  13. B/L foot drops- suggest prevalon boots at night- doesn't want PRAFOs- thought they might be too heavy; also needs AFO consults for B/L foot drop.  14 Encephalopathy- sounds like resolved- not sure needs SLP. 15.  Hyponatremia  Sodium 132 on 3/9, labs pending  Continue to monitor 16.  Transaminitis  AST elevated on 3/9  Continue to monitor 17.  Sinus tachycardia  Borderline on 3/11  Continue to monitor  LOS: 3 days A FACE TO FACE  EVALUATION WAS PERFORMED  Ankit Karis Juba 08/18/2020, 9:27 AM

## 2020-08-19 MED ORDER — NICOTINE 7 MG/24HR TD PT24
7.0000 mg | MEDICATED_PATCH | Freq: Every day | TRANSDERMAL | Status: DC
  Administered 2020-08-20: 7 mg via TRANSDERMAL
  Filled 2020-08-19 (×2): qty 1

## 2020-08-19 NOTE — Plan of Care (Signed)
  Problem: RH BOWEL ELIMINATION Goal: RH STG MANAGE BOWEL WITH ASSISTANCE Description: STG Manage Bowel with Mod I Assistance Outcome: Progressing   Problem: RH SKIN INTEGRITY Goal: RH STG MAINTAIN SKIN INTEGRITY WITH ASSISTANCE Description: STG Maintain Skin Integrity With Mod I Assistance. Outcome: Progressing   Problem: RH PAIN MANAGEMENT Goal: RH STG PAIN MANAGED AT OR BELOW PT'S PAIN GOAL Description: Patient will maintain pain level less than 4 on 0-10 scale Outcome: Progressing   Problem: RH KNOWLEDGE DEFICIT GENERAL Goal: RH STG INCREASE KNOWLEDGE OF SELF CARE AFTER HOSPITALIZATION Description: Patient will be able to demonstrate understanding of home medication regimen independently.   Outcome: Progressing   Problem: Consults Goal: RH GENERAL PATIENT EDUCATION Description: See Patient Education module for education specifics. Outcome: Progressing   

## 2020-08-19 NOTE — Progress Notes (Signed)
Occupational Therapy Session Note  Patient Details  Name: Harold Flores MRN: [id number] Date of Birth: [birth year]  Today's Date: 08/19/2020 OT Individual Time: 1000-1058 OT Individual Time Calculation (min): 58 min    Short Term Goals: Week 1:  OT Short Term Goal 1 (Week 1): Pt will complete toilet transfer with min A + AE PRN OT Short Term Goal 2 (Week 1): Pt will complete grooming at sink in standing with min A for > 2 min with AE PRN. OT Short Term Goal 3 (Week 1): Pt will don pants with min A + AE PRN.  Skilled Therapeutic Interventions/Progress Updates:    Session 1:  Pt received in bed agreeable to OT. Pt agreeable to shower and no pain reported throughout session.   ADL:  Pt completes bathing with S overall for seated bathing using lateral leans to wash buttocks Pt completes UB dressing with set up seated Pt completes LB dressing with CGA with VC for pointing toes to get feet through tapered pants Pt completes footwear with S overall crossing into figure 4 to don socks and shoes. May benefit from shoe funnel to don shoes with improved ease Pt completes shower/Tub transfer with CGA for ambulation with RW and VC for sequencing TTB shower transfer for bathing and later TTB in tub in ADL apartment for simulated home practive Pt grooms at sink with set up seated with discussion about adpatations to space to improve energy conservation at home as pt has lengthy skin care routine and may need to sit to complete.   Pt left at end of session in w/c with exit alarm on, call light in reach and all needs met  Session 2: 45 min  Pt received in w/c agreeable to OT with no pain reported.  Therapeutic activity Pt completes w/c propulsion to ADL apartment to practice ambulatory kitchen retrieval of items with RW. Edu re use of walker tray, bag or basket as needed at home to improve independence. Pt verbalized understanding. Pt with CGA-MIN A overall for ambulation with 1 instand of L  knee buckling with MIN A to recover. Pt able to carry over education of sliding bowl across counter to place gathered items into. Good safety awareness and RW maangement throughout acticity. In [facility] pt educated on management of leg rests on and off which pt completes with S. Pt completes ambulatory transfer to booth seat to practice simulated mobility in restaurant with CGA-MIN A to power up from low surface. Returned to room with total A for time management/w.c propulsion.  Pt left at end of session in bed with exit alarm on, call light in reach and all needs met   Therapy Documentation Precautions:  Precautions Precautions: Fall Precaution Comments: falls, B knee buckling Restrictions Weight Bearing Restrictions: No General:   Vital Signs: Therapy Vitals Temp: 98.4 F (36.9 C) Pulse Rate: 97 Resp: 20 BP: 133/85 Patient Position (if appropriate): Lying Oxygen Therapy SpO2: 100 % O2 Device: Room Air Pain:   ADL: ADL Eating: Set up Where Assessed-Eating: Wheelchair Grooming: Setup Where Assessed-Grooming: Sitting at [employer] Upper Body Bathing: Supervision/safety Where Assessed-Upper Body Bathing: Shower Lower Body Bathing: Minimal assistance Where Assessed-Lower Body Bathing: Shower Upper Body Dressing: Minimal assistance Where Assessed-Upper Body Dressing: Edge of bed Lower Body Dressing: Moderate assistance Where Assessed-Lower Body Dressing: Edge of bed Toileting: Moderate assistance Where Assessed-Toileting: Glass blower/designer: Moderate assistance Toilet Transfer Method: Stand pivot Toilet Transfer Equipment: Bedside commode,Grab bars Social research officer, government: Moderate assistance Social research officer, government Method:  Stand pivot Youth worker: Nurse, learning disability    Praxis   Exercises:   Other Treatments:     Therapy/Group: Individual Therapy  Tonny Branch 08/19/2020, 6:50 AM

## 2020-08-19 NOTE — Progress Notes (Addendum)
Physical Therapy Session Note  Patient Details  Name: Harold Flores MRN: [id number] Date of Birth: 09-26-1990  Today's Date: 08/19/2020 PT Individual Time: 0800-0900; 7622-6333 PT Individual Time Calculation (min): 60 min , 35 min  Short Term Goals: Week 1:  PT Short Term Goal 1 (Week 1): Patient will perform sit to stand with min A PT Short Term Goal 2 (Week 1): Patient to ambulate with RW and min A x 120' PT Short Term Goal 3 (Week 1): Patient to negotiated 4 steps with rails and min A PT Short Term Goal 4 (Week 1): Patient to tolerate standing at least 5 minutes for functional task  Skilled Therapeutic Interventions/Progress Updates:  tx 1:  Pt seated EOB with Steward Drone, NT assisting him in donning pants. He denied pain, but stated he had paraesthesias bil feet. With PT, Sit>< stand iwht CGa, RW and min assist to pull pants over hips.  Pt doffed non slip socks and donned regular socks with supervision, in sitting.  He suggested removing innersoles of slip on shoes, which he did, then donned shoes with min assist.  Stand pivot with Rw to WC., CGA.  wc propulsion using bil UEs on level tile, x 150' with supervison.  PT instructed pt in efficient propulsion and turning techniques with good carryover.  Gait training iwht RW through 4 item obstacle course, x 150' with CGA.  With ACES on lower LEs to address bil foot drop, step up/down (1) 3" high step using bil hand rails, x 3.   neuromuscular re-education via demo and multimodal cues for :10 x 1 each glut sets, adductor squeezes using bolster between knees, heel raises, bil sustained long arc quad knee extensions while pt propelled w/c x 50' with bil UEs. Cues for paced breathing during movements.  Sit> stand training regarding forward wt shift, trunk flexion, for sit> stand pushing up on bil armrests of wc> bil handrails of wooden steps with CGA.  Use of bil LEs for propelling w/c in tight spaces and circles, for coordination and  strengthening.    At end of session, pt seated in w/c with needs at hand and seat belt alarm set.  tx 2:  Pt seated im wc, propelling around room, with seat belt alarm set.  He rated pain bil LEs 3/10, premedicated.  W/c propulsion to gym with distsnt supervision, for activity tolerance.  Sit> stand without use of AD, CGA without cues for sequencing or technique.  neuromuscular re-education via demo and multimodal cues:  in hook lying position: bil bridging, cervical flexion, R/L hip flexion;  in R/L side lying for L/R hip abduction with flexed hips and knees. Smoothness of movement improved with practice of each movement.  Squat pivot transfer mat> w/c with CGA,  to reinforce forward wt shift, trunk and cervical flexion for sit> stand.   At end of session, pt in wc with needs at hand and seat belt alarm set.  Therapy Documentation Precautions:  Precautions Precautions: Fall Precaution Comments: falls, B knee buckling Restrictions Weight Bearing Restrictions: No       Therapy/Group: Individual Therapy  Leroy Trim 08/19/2020, 10:21 AM

## 2020-08-20 DIAGNOSIS — B20 Human immunodeficiency virus [HIV] disease: Secondary | ICD-10-CM

## 2020-08-20 DIAGNOSIS — R7401 Elevation of levels of liver transaminase levels: Secondary | ICD-10-CM

## 2020-08-20 NOTE — Progress Notes (Signed)
[place] PHYSICAL MEDICINE & REHABILITATION PROGRESS NOTE  Subjective/Complaints: Patient seen laying in bed this morning.  He states he slept well overnight.  He is questions regarding disability paperwork.  He notes slight improvement in lower extremities.  ROS: Denies CP, SOB, N/V/D  Objective: Vital Signs: Blood pressure 134/81, pulse (!) 101, temperature 98.8 F (37.1 C), temperature source Oral, resp. rate 18, height 5\' 10"  (1.778 m), weight 77.9 kg, SpO2 100 %. No results found. No results for input(s): WBC, HGB, HCT, PLT in the last 72 hours. Recent Labs    08/18/20 0522  NA 132*  K 4.5  CL 101  CO2 24  GLUCOSE 89  BUN 11  CREATININE 0.70  CALCIUM 9.1    Intake/Output Summary (Last 24 hours) at 08/20/2020 0858 Last data filed at 08/20/2020 0800 Gross per 24 hour  Intake 813 ml  Output 525 ml  Net 288 ml        Physical Exam: BP 134/81 (BP Location: Left Arm)   Pulse (!) 101   Temp 98.8 F (37.1 C) (Oral)   Resp 18   Ht 5\' 10"  (1.778 m)   Wt 77.9 kg   SpO2 100%   BMI 24.64 kg/m  Constitutional: No distress . Vital signs reviewed. HENT: Normocephalic.  Atraumatic. Eyes: EOMI. No discharge. Cardiovascular: No JVD.  RRR. Respiratory: Normal effort.  No stridor.  Bilateral clear to auscultation. GI: Non-distended.  BS +. Skin: Warm and dry.  Intact. Psych: Normal mood.  Normal behavior. Musc: No edema in extremities.  No tenderness in extremities. Neuro: Alert Motor:  Bilateral UE: 4+/5 proximal to distal Bilateral lower extremities: Hip flexion, knee extension 4-/5, ankle dorsiflexion 0/5, trace movement in toes Sensation diminished to light touch bilateral feet  Assessment/Plan: 1. Functional deficits which require 3+ hours per day of interdisciplinary therapy in a comprehensive inpatient rehab setting.  Physiatrist is providing close team supervision and 24 hour management of active medical problems listed below.  Physiatrist and rehab team  continue to assess barriers to discharge/monitor patient progress toward functional and medical goals   Care Tool:  Bathing    Body parts bathed by patient: Right arm,Left arm,Front perineal area,Chest,Abdomen,Right upper leg,Left upper leg,Right lower leg,Left lower leg,Face   Body parts bathed by helper: Buttocks     Bathing assist Assist Level: Minimal Assistance - Patient > 75%     Upper Body Dressing/Undressing Upper body dressing   What is the patient wearing?: Pull over shirt    Upper body assist Assist Level: Set up assist    Lower Body Dressing/Undressing Lower body dressing      What is the patient wearing?: Pants     Lower body assist Assist for lower body dressing: Minimal Assistance - Patient > 75%     Toileting Toileting    Toileting assist Assist for toileting: Minimal Assistance - Patient > 75%     Transfers Chair/bed transfer  Transfers assist     Chair/bed transfer assist level: Contact Guard/Touching assist     Locomotion Ambulation   Ambulation assist      Assist level: Contact Guard/Touching assist Assistive device: Walker-rolling Max distance: 150   Walk 10 feet activity   Assist     Assist level: Contact Guard/Touching assist Assistive device: Walker-rolling   Walk 50 feet activity   Assist    Assist level: Contact Guard/Touching assist Assistive device: Walker-rolling    Walk 150 feet activity   Assist Walk 150 feet activity did not occur:  Safety/medical concerns         Walk 10 feet on uneven surface  activity   Assist Walk 10 feet on uneven surfaces activity did not occur: Safety/medical concerns   Assist level: Moderate Assistance - Patient - 50 - 74% Assistive device: Photographer Will patient use wheelchair at discharge?: No Type of Wheelchair: Manual    Wheelchair assist level: Supervision/Verbal cueing Max wheelchair distance: 150    Wheelchair 50 feet  with 2 turns activity    Assist        Assist Level: Supervision/Verbal cueing   Wheelchair 150 feet activity     Assist  Wheelchair 150 feet activity did not occur: Safety/medical concerns   Assist Level: Supervision/Verbal cueing    Medical Problem List and Plan: 1.  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  Antithrombotics: -DVT/anticoagulation:  Pharmaceutical: Lovenox             -antiplatelet therapy: N/A 3. Pain Management: Gabapentin tid 4. Mood: Team to provide ego support.              -antipsychotic agents: N/A 5. Neuropsych: This patient is not fully capable of making decisions on his own behalf. 6. Skin/Wound Care: Routine pressure relief measures  7. Fluids/Electrolytes/Nutrition: Monitor I/Os 8. HTN: Monitor BP tid--continue Norvasc and coreg bid.   Controlled on 3/13  Monitor with increased mobility 9. HIV/AIDS: Now on Bitarvy (has been approved for assistance) with Bactrim daily for prophylaxis.             --Imodium prn for diarrhea 10. ETOH abuse with folate/thiamine deficiency:  Completed IV doses of folic acid/Thiamine              On folic acid ane thiamine for supplement.   Counsel 11. Hypomagnesemia:              Received 2 gram IV doses--03/04, 03/06 and 03/8.              Now on daily po supplement, changed on 3/9.   IV magnesium 4 g ordered on 3/10  Mag 1.8 on 3/11, labs ordered for tomorrow 12. GAD/depression: On Zoloft 100 mg bid with low dose ativan bid prn.  13. B/L foot drops- suggest prevalon boots at night- doesn't want PRAFOs- thought they might be too heavy; also needs AFO consults for B/L foot drop.  14 Encephalopathy- sounds like resolved- not sure needs SLP. 15.  Hyponatremia  Sodium 132 on 3/11  Continue to monitor 16.  Transaminitis  AST elevated on 3/9  Continue to  monitor 17.  Sinus tachycardia  Borderline on 3/13  Continue to monitor  LOS: 5 days A FACE TO FACE EVALUATION WAS PERFORMED  Ankit Karis Juba 08/20/2020, 8:58 AM

## 2020-08-21 LAB — BASIC METABOLIC PANEL
Anion gap: 6 (ref 5–15)
BUN: 11 mg/dL (ref 6–20)
CO2: 26 mmol/L (ref 22–32)
Calcium: 9.2 mg/dL (ref 8.9–10.3)
Chloride: 103 mmol/L (ref 98–111)
Creatinine, Ser: 0.7 mg/dL (ref 0.61–1.24)
GFR, Estimated: >60 mL/min (ref >60)
Glucose, Bld: 89 mg/dL (ref 70–99)
Potassium: 3.8 mmol/L (ref 3.5–5.1)
Sodium: 135 mmol/L (ref 135–145)

## 2020-08-21 LAB — CBC
HCT: 23.6 % — ABNORMAL LOW (ref 39.0–52.0)
Hemoglobin: 8.3 g/dL — ABNORMAL LOW (ref 13.0–17.0)
MCH: 32.9 pg (ref 26.0–34.0)
MCHC: 35.2 g/dL (ref 30.0–36.0)
MCV: 93.7 fL (ref 80.0–100.0)
Platelets: 374 10*3/uL (ref 150–400)
RBC: 2.52 MIL/uL — ABNORMAL LOW (ref 4.22–5.81)
RDW: 17.2 % — ABNORMAL HIGH (ref 11.5–15.5)
WBC: 3.2 10*3/uL — ABNORMAL LOW (ref 4.0–10.5)
nRBC: 0 % (ref 0.0–0.2)

## 2020-08-21 LAB — MAGNESIUM: Magnesium: 1.5 mg/dL — ABNORMAL LOW (ref 1.7–2.4)

## 2020-08-21 MED ORDER — MAGNESIUM SULFATE 4 GM/100ML IV SOLN
4.0000 g | Freq: Once | INTRAVENOUS | Status: AC
  Administered 2020-08-21: 4 g via INTRAVENOUS
  Filled 2020-08-21: qty 100

## 2020-08-21 MED ORDER — MAGNESIUM SULFATE 2 GM/50ML IV SOLN
2.0000 g | Freq: Once | INTRAVENOUS | Status: DC
  Filled 2020-08-21: qty 50

## 2020-08-21 NOTE — Progress Notes (Signed)
Physical Therapy Session Note  Patient Details  Name: Harold Flores MRN: [id number] Date of Birth: May 31, 1991  Today's Date: 08/21/2020 PT Individual Time: 0935-1005 PT Individual Time Calculation (min): 30 min   Short Term Goals: Week 1:  PT Short Term Goal 1 (Week 1): Patient will perform sit to stand with min A PT Short Term Goal 2 (Week 1): Patient to ambulate with RW and min A x 120' PT Short Term Goal 3 (Week 1): Patient to negotiated 4 steps with rails and min A PT Short Term Goal 4 (Week 1): Patient to tolerate standing at least 5 minutes for functional task  Skilled Therapeutic Interventions/Progress Updates:    Patient in w/c and reports feeling well.  Sit to stand to RW with CGA and ambulated x 120' x 2 with RW and CGA/min A.  Noted buckling of L LE x 3 with min A to recover.  Trial of AFO on R for managing foot drop.  Trial with anterior support and without ambulating with RW and R AFO each time 28' and pt felt more secure with anterior support, but not well fitting so removed prior to return to room.  Discussed pt asking his mom to pick him up a pair of lace up shoes for fitting with AFO's.  MD notified needing order for bilateral AFO's.  Patient returned to room as noted above.  Sit to supine with S.  Left in supine with call bell and needs in reach, bed alarm active.   Therapy Documentation Precautions:  Precautions Precautions: Fall Precaution Comments: falls, B knee buckling Restrictions Weight Bearing Restrictions: No Pain: Pain Assessment Pain Scale: 0-10 Pain Score: 0-No pain   Therapy/Group: Individual Therapy  Elray Mcgregor  Sheran Lawless, PT 08/21/2020, 9:32 AM

## 2020-08-21 NOTE — Progress Notes (Signed)
Patient appears to be resting comfortable throughout shift, denies pain or discomfort, respiration unlabored on RA,coloration appears adequate, no verbal c/o,upon request provided patient with prn nicotine gum,verbalize "feeling anxious and the patch was not helping", Weakness slowly improving in LE's states patient. Continue regime

## 2020-08-21 NOTE — Progress Notes (Signed)
Occupational Therapy Session Note  Patient Details  Name: Harold Flores MRN: [id number] Date of Birth: 10-23-1990  Today's Date: 08/21/2020 OT Individual Time: 0800-0900    &   1430-1530 OT Individual Time Calculation (min): 60 min    &   60 min   Short Term Goals: Week 1:  OT Short Term Goal 1 (Week 1): Pt will complete toilet transfer with min A + AE PRN OT Short Term Goal 2 (Week 1): Pt will complete grooming at sink in standing with min A for > 2 min with AE PRN. OT Short Term Goal 3 (Week 1): Pt will don pants with min A + AE PRN.  Skilled Therapeutic Interventions/Progress Updates:    AM session:   Patient in bed, alert and ready for therapy session.   He notes ongoing tingling/sensory impairment in legs, stiffness in bilateral hands, but denies pain at this time.  Supine to sitting edge of bed with CS, sit to stand and ambulation with RW to/from bed, shower bench, WC with CGA.  Able to complete shower seated with DS.  Dressing completed seated edge of bed.  Able to donn OH shirt, pants, socks, shoes CS/CGA.  Reviewed use of shoe funnel with good carryover.  Grooming tasks w/c level mod I.  W/c mobility on unit with CS.  He remained seated in w/c at close of session, seat alarm set by nurse tech.     PM session:   Patient seated in w/c, ready for PM session.   He denies pain and is eager to work this session.  He is able to propel w/c to and from therapy gym.  Sit to stand and short distance ambulation with RW to mat table CGA.  Patient with good unsupported seated balance for core strengthening, trunk mobility, proximal/scapular exercises, hip and balance activities.  Provided and reviewed theraputty/hand exercises, theraband exercises.  Completed stand to hands and knees with CGA, tall kneel and core challenges in each of these positions.  He remained seated in w/c at close of session with seat alarm set and callbell/traytable in reach.       Therapy Documentation Precautions:   Precautions Precautions: Fall Precaution Comments: falls, B knee buckling Restrictions Weight Bearing Restrictions: No   Therapy/Group: Individual Therapy  [name] 08/21/2020, 7:32 AM

## 2020-08-21 NOTE — Progress Notes (Signed)
[place] PHYSICAL MEDICINE & REHABILITATION PROGRESS NOTE  Subjective/Complaints: Patient's chart reviewed- reported anxiety and patch not working to [employer].  Vitals signs stable except for tachycardia to 101   ROS: Denies CP, SOB, N/V/D  Objective: Vital Signs: Blood pressure 130/83, pulse (!) 101, temperature 98.9 F (37.2 C), resp. rate 18, height 5\' 10"  (1.778 m), weight 77.9 kg, SpO2 100 %. No results found. Recent Labs    08/21/20 0547  WBC 3.2*  HGB 8.3*  HCT 23.6*  PLT 374   Recent Labs    08/21/20 0547  NA 135  K 3.8  CL 103  CO2 26  GLUCOSE 89  BUN 11  CREATININE 0.70  CALCIUM 9.2    Intake/Output Summary (Last 24 hours) at 08/21/2020 1214 Last data filed at 08/21/2020 0919 Gross per 24 hour  Intake 712 ml  Output 925 ml  Net -213 ml        Physical Exam: BP 130/83 (BP Location: Left Arm)   Pulse (!) 101   Temp 98.9 F (37.2 C)   Resp 18   Ht 5\' 10"  (1.778 m)   Wt 77.9 kg   SpO2 100%   BMI 24.64 kg/m  Gen: no distress, normal appearing HEENT: oral mucosa pink and moist, NCAT Cardio: Reg rate Chest: normal effort, normal rate of breathing Abd: soft, non-distended Ext: no edema Psych: pleasant, normal affect Skin: intact Musc: No edema in extremities.  No tenderness in extremities. Neuro: Alert Motor:  Bilateral UE: 4+/5 proximal to distal Bilateral lower extremities: Hip flexion, knee extension 4-/5, ankle dorsiflexion 0/5, trace movement in toes Sensation diminished to light touch bilateral feet  Assessment/Plan: 1. Functional deficits which require 3+ hours per day of interdisciplinary therapy in a comprehensive inpatient rehab setting.  Physiatrist is providing close team supervision and 24 hour management of active medical problems listed below.  Physiatrist and rehab team continue to assess barriers to discharge/monitor patient progress toward functional and medical goals   Care Tool:  Bathing    Body parts bathed by  patient: Right arm,Left arm,Front perineal area,Chest,Abdomen,Right upper leg,Left upper leg,Right lower leg,Left lower leg,Face   Body parts bathed by helper: Buttocks     Bathing assist Assist Level: Minimal Assistance - Patient > 75%     Upper Body Dressing/Undressing Upper body dressing   What is the patient wearing?: Pull over shirt    Upper body assist Assist Level: Set up assist    Lower Body Dressing/Undressing Lower body dressing      What is the patient wearing?: Pants     Lower body assist Assist for lower body dressing: Minimal Assistance - Patient > 75%     Toileting Toileting    Toileting assist Assist for toileting: Minimal Assistance - Patient > 75%     Transfers Chair/bed transfer  Transfers assist     Chair/bed transfer assist level: Contact Guard/Touching assist     Locomotion Ambulation   Ambulation assist      Assist level: Contact Guard/Touching assist Assistive device: Walker-rolling Max distance: 150   Walk 10 feet activity   Assist     Assist level: Contact Guard/Touching assist Assistive device: Walker-rolling   Walk 50 feet activity   Assist    Assist level: Contact Guard/Touching assist Assistive device: Walker-rolling    Walk 150 feet activity   Assist Walk 150 feet activity did not occur: Safety/medical concerns         Walk 10 feet on uneven surface  activity  Assist Walk 10 feet on uneven surfaces activity did not occur: Safety/medical concerns   Assist level: Moderate Assistance - Patient - 50 - 74% Assistive device: Photographer Will patient use wheelchair at discharge?: No Type of Wheelchair: Manual    Wheelchair assist level: Supervision/Verbal cueing Max wheelchair distance: 150    Wheelchair 50 feet with 2 turns activity    Assist        Assist Level: Supervision/Verbal cueing   Wheelchair 150 feet activity     Assist  Wheelchair 150  feet activity did not occur: Safety/medical concerns   Assist Level: Supervision/Verbal cueing    Medical Problem List and Plan: 1.  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  Antithrombotics: -DVT/anticoagulation:  Pharmaceutical: Continue Lovenox             -antiplatelet therapy: N/A 3. Pain Management: Gabapentin tid 4. Mood: Team to provide ego support.              -antipsychotic agents: N/A 5. Neuropsych: This patient is not fully capable of making decisions on his own behalf. 6. Skin/Wound Care: Routine pressure relief measures. 7. Fluids/Electrolytes/Nutrition: Monitor I/Os 8. HTN: Monitor BP tid--continue Norvasc and coreg bid.   Controlled on 3/14  Monitor with increased mobility 9. HIV/AIDS: Now on Bitarvy (has been approved for assistance) with Bactrim daily for prophylaxis.             --Imodium prn for diarrhea 10. ETOH abuse with folate/thiamine deficiency:  Completed IV doses of folic acid/Thiamine              On folic acid ane thiamine for supplement.   Counsel 11. Hypomagnesemia:              Received 2 gram IV doses--03/04, 03/06 and 03/8.              Now on daily po supplement, changed on 3/9.   IV magnesium 4 g ordered on 3/10  Mag 1.8 on 3/11, 1.5 on 3/14- IV mag 4g ordered, repeat tomorrow.  12. GAD/depression: On Zoloft 100 mg bid with low dose ativan bid prn.  13. B/L foot drops- suggest prevalon boots at night- doesn't want PRAFOs- thought they might be too heavy; also needs AFO consults for B/L foot drop.  14 Encephalopathy- sounds like resolved- not sure needs SLP. 15.  Hyponatremia  Sodium 132 on 3/11, 135 on 3/14, repeat in 1 week.   Continue to monitor 16.  Transaminitis  AST elevated on 3/9  Continue to monitor 17.  Sinus tachycardia  Borderline on 3/14  Continue to monitor 18. Anemia:  Hgb 8.3, repeat in 1 week.   LOS: 6 days A FACE TO FACE EVALUATION WAS PERFORMED  Harold Pry Waqas Flores 08/21/2020, 12:14 PM

## 2020-08-21 NOTE — Progress Notes (Signed)
Orthopedic Tech Progress Note Patient Details:  Harold Flores [birth year] [id number] Called in order to [facility] for  A BLE AFOS Patient ID: [name], male   DOB: 1991-01-29, 29 y.o.   MRN: [id number]   Donald Pore 08/21/2020, 1:50 PM

## 2020-08-21 NOTE — Progress Notes (Signed)
Physical Therapy Session Note  Patient Details  Name: Harold Flores MRN: [id number] Date of Birth: 1990-10-13  Today's Date: 08/21/2020 PT Individual Time: 0712-1975 PT Individual Time Calculation (min): 29 min   Short Term Goals: Week 1:  PT Short Term Goal 1 (Week 1): Patient will perform sit to stand with min A PT Short Term Goal 2 (Week 1): Patient to ambulate with RW and min A x 120' PT Short Term Goal 3 (Week 1): Patient to negotiated 4 steps with rails and min A PT Short Term Goal 4 (Week 1): Patient to tolerate standing at least 5 minutes for functional task  Skilled Therapeutic Interventions/Progress Updates:    Pt received supine in bed, agreeable to therapy - no reports of pain. Supine<>sit with supervision with use of bed features. Donned skinny jeans, t-shirt, socks, and slip-on shoes with setupA while seated EOB. Sit<>stand with minA and no AD from EOB where he required minA for standing balance while he pulled pants over hips. Stand<>pivot with minA to w/c with cues for sequencing and safety approach. Pt propelled himself mod I in w/c to main rehab gym, ~174ft, using BUE's. Retrieved EMPI NMES to perform muscle recruitment for L ankle DF. Applied NMES pads in several locations along anterior tib muscle groups on L - unfortunately, unable to achieve adequate muscle recruitment to achieve active ankle DF. Due to time constraints, unable to assess his R ankle for muscle recruitment. Pt returned to his room with totalA in w/c and he remained seated in w/c with RN at bedside for routine medications. Patient appreciative of therapist interventions - educated on upcoming therapy schedule and patient agreeable to remain sitting in w/c.   Therapy Documentation Precautions:  Precautions Precautions: Fall Precaution Comments: falls, B knee buckling Restrictions Weight Bearing Restrictions: No  Therapy/Group: Individual Therapy  Rockney Grenz P Shakima Nisley PT 08/21/2020, 7:03 AM

## 2020-08-22 DIAGNOSIS — D649 Anemia, unspecified: Secondary | ICD-10-CM

## 2020-08-22 LAB — MAGNESIUM: Magnesium: 1.9 mg/dL (ref 1.7–2.4)

## 2020-08-22 MED ORDER — MAGNESIUM GLUCONATE 500 MG PO TABS
500.0000 mg | ORAL_TABLET | Freq: Two times a day (BID) | ORAL | Status: DC
  Administered 2020-08-22 – 2020-08-24 (×4): 500 mg via ORAL
  Filled 2020-08-22 (×5): qty 1

## 2020-08-22 NOTE — Progress Notes (Signed)
Patient received IVB Magnesium Sulfate 4g as ordered, tolerated w/o difficulty

## 2020-08-22 NOTE — Progress Notes (Signed)
Physical Therapy Weekly Progress Note  Patient Details  Name: Harold Flores MRN: [id number] Date of Birth: 05-12-1991  Beginning of progress report period: August 16, 2020 End of progress report period: August 22, 2020  Today's Date: 08/22/2020 PT Individual Time:Session1: 3428-7681; Session2:1500-1600 PT Individual Time Calculation (min): 57 min & 60 min  Patient has met 4 of 4 short term goals.  Patient had achieved all STG's able to perform transfers with CGA and ambulating up to 150' with RW and CGA to min A limited due to significant foot drop.  Able to negotiate 8 steps with bilateral rails with min A.    Patient continues to demonstrate the following deficits muscle weakness and impaired timing and sequencing, ataxia and decreased coordination, decreased balance and limited activity tolerance and therefore will continue to benefit from skilled PT intervention to increase functional independence with mobility.  Patient progressing toward long term goals..  Continue plan of care.  PT Short Term Goals Week 1:  PT Short Term Goal 1 (Week 1): Patient will perform sit to stand with min A PT Short Term Goal 1 - Progress (Week 1): Met PT Short Term Goal 2 (Week 1): Patient to ambulate with RW and min A x 120' PT Short Term Goal 2 - Progress (Week 1): Met PT Short Term Goal 3 (Week 1): Patient to negotiated 4 steps with rails and min A PT Short Term Goal 3 - Progress (Week 1): Met PT Short Term Goal 4 (Week 1): Patient to tolerate standing at least 5 minutes for functional task PT Short Term Goal 4 - Progress (Week 1): Met Week 2:  PT Short Term Goal 1 (Week 2): Patient will perform transfers consistently with S PT Short Term Goal 2 (Week 2): Patient will negotiate 4 steps with rails and S. PT Short Term Goal 3 (Week 2): Patient will ambulate with LRAD, AFO's and S x 150' PT Short Term Goal 4 (Week 2): Patient will perform floor transfers with S.  Skilled Therapeutic  Interventions/Progress Updates:  Session1:  Patient in w/c and prepared for therapy.  Sit to stand to RW and patient ambulated to therapy gym with min to CGA.  Patient negotiated 8 (3") steps with bilateral rails and min A with min cues for foot position.  Patient in parallel bars for forward lunges onto BOSU with bilateral UE support and CGA x 10, half squats x 10 with UE support, seated in w/c propelling with LE's for hamstring strength.  Performed stand pivot to mat with CGA without RW.  On mat in tall kneeling with ball for UE support performing hip extension to heel sit, lateral weight shifts for hip strength then side stepping with min A.  Patient squat pivot to w/c with S after set up.  Patient ambulated to room with RW and min A.  Sit to supine with S and left in supine with call bell and needs in reach and bed alarm active.   [name]: Patient in supine and reports had visit from orthotist earlier with OT and plans for fitting tomorrow at 1 pm.  Patient supine to sit with S and donned shoes at EOB with S. Performed sit to stand to RW with CGA and ambulated to therapy gym x 150' with min A.  Sit to supine on mat S and performed core strengthening activities consisting of low trunk rotation with legs over red physioball x 10, then trunk, hip and knee flexion with feet on ball x 10, long trunk  rotation with feet on ball x 10, then performed sidelying clamshell hip abduction with orange t-band x 15 each leg.  Patient supine to sit with S then seated EOM to perform weighted ball activities hip to shoulder x 10 each way then pushing ball out from center and back x 10.  Patient transferred to sitting on blue physio ball next to mat with min A.  Seated on ball for finding neutral pelvis, then same weighted ball activities as previously noted then using orange t-band for horizontal shoulder abduction x 10, then diagonal shoulder flexion x 10 each arm.  Patient transferred to mat min a, then to w/c with S.   Propelled w/c to dayroom with S.  Nu Step x 10 min, UE/LE at level 4.  Patient transferred to w/c then propelled to room.  Squat pivot to bed without device.  Left in supine with call bell and needs in reach and bed alarm activated.   Ambulation/gait training;DME/adaptive equipment instruction;Balance/vestibular training;Therapeutic Activities;UE/LE Coordination activities;Therapeutic Exercise;Splinting/orthotics;Patient/family education;Functional mobility training;Disease management/prevention;Stair training;UE/LE Strength taining/ROM;Wheelchair propulsion/positioning;Neuromuscular re-education;Discharge planning   Therapy Documentation Precautions:  Precautions Precautions: Fall Precaution Comments: falls, B knee buckling Restrictions Weight Bearing Restrictions: No Pain: Pain Assessment Pain Scale: 0-10 Pain Score: 0-No pain Faces Pain Scale: Hurts a little bit Pain Location: Foot Pain Orientation: Right;Left Pain Descriptors / Indicators: Tingling Pain Intervention(s): Distraction  Therapy/Group: Individual Therapy  Cynthia Wynn  Cyndi Wynn, PT 08/22/2020, 10:04 AM   

## 2020-08-22 NOTE — Progress Notes (Signed)
Occupational Therapy Session Note  Patient Details  Name: Harold Flores MRN: [id number] Date of Birth: December 26, 1990  Today's Date: 08/22/2020 OT Individual Time: 1300-1415 OT Individual Time Calculation (min): 75 min    Short Term Goals: Week 1:  OT Short Term Goal 1 (Week 1): Pt will complete toilet transfer with min A + AE PRN OT Short Term Goal 2 (Week 1): Pt will complete grooming at sink in standing with min A for > 2 min with AE PRN. OT Short Term Goal 3 (Week 1): Pt will don pants with min A + AE PRN.  Skilled Therapeutic Interventions/Progress Updates:    patient in bed, alert and ready for therapy.  Father present for start of session.   Provided and reviewed patient education related to GBS.  Patient denies pain and states that he wants to work on balance and strengthening.  [name] from [place] present for part of session to evaluate gait with RW and lower leg mobility.  Patient able to ambulate with RW CGA.  He propels w/c mod I on unit, able to manage breaks and move leg rests into place.  Completed unsupported sitting core and upper body exercises, standing balance/reach/weight shift/hip mobility/controlled sit to and from stand.  Tall kneel on mat quad exercises, trunk exercises with CGA/min A.  Provided HEP to include theraputty and arm exercises with good understanding demonstrated.  He returned to bed at close of session min A without AD.  Sit to supine mod I.  Bed alarm set and call bell in reach.    Therapy Documentation Precautions:  Precautions Precautions: Fall Precaution Comments: falls, B knee buckling Restrictions Weight Bearing Restrictions: No   Therapy/Group: Individual Therapy  [name] 08/22/2020, 7:34 AM

## 2020-08-22 NOTE — Progress Notes (Signed)
[place] PHYSICAL MEDICINE & REHABILITATION PROGRESS NOTE  Subjective/Complaints: Patient seen laying in bed this AM. He states he slept well overnight.  He is awaiting his AFOs.  ROS: Denies CP, SOB, N/V/D  Objective: Vital Signs: Blood pressure 132/84, pulse 98, temperature 98.7 F (37.1 C), temperature source Oral, resp. rate 20, height 5\' 10"  (1.778 m), weight 77.9 kg, SpO2 100 %. No results found. Recent Labs    08/21/20 0547  WBC 3.2*  HGB 8.3*  HCT 23.6*  PLT 374   Recent Labs    08/21/20 0547  NA 135  K 3.8  CL 103  CO2 26  GLUCOSE 89  BUN 11  CREATININE 0.70  CALCIUM 9.2    Intake/Output Summary (Last 24 hours) at 08/22/2020 1012 Last data filed at 08/22/2020 08/24/2020 Gross per 24 hour  Intake 920 ml  Output 850 ml  Net 70 ml        Physical Exam: BP 132/84 (BP Location: Left Arm)   Pulse 98   Temp 98.7 F (37.1 C) (Oral)   Resp 20   Ht 5\' 10"  (1.778 m)   Wt 77.9 kg   SpO2 100%   BMI 24.64 kg/m   Constitutional: No distress . Vital signs reviewed. HENT: Normocephalic.  Atraumatic. Eyes: EOMI. No discharge. Cardiovascular: No JVD.  Borderline tachycardia.  Regular rhythm. Respiratory: Normal effort.  No stridor.  Bilateral clear to auscultation. GI: Non-distended.  BS +. Skin: Warm and dry.  Intact. Psych: Normal mood.  Normal behavior. Musc: No edema in extremities.  No tenderness in extremities. Neuro: Alert Motor:  Bilateral UE: 4+/5 proximal to distal Bilateral lower extremities: Hip flexion, knee extension 4/5, ankle dorsiflexion 2-/5,  Sensation diminished to light touch bilateral feet  Assessment/Plan: 1. Functional deficits which require 3+ hours per day of interdisciplinary therapy in a comprehensive inpatient rehab setting.  Physiatrist is providing close team supervision and 24 hour management of active medical problems listed below.  Physiatrist and rehab team continue to assess barriers to discharge/monitor patient progress  toward functional and medical goals   Care Tool:  Bathing    Body parts bathed by patient: Right arm,Left arm,Front perineal area,Chest,Abdomen,Right upper leg,Left upper leg,Right lower leg,Left lower leg,Face   Body parts bathed by helper: Buttocks     Bathing assist Assist Level: Minimal Assistance - Patient > 75%     Upper Body Dressing/Undressing Upper body dressing   What is the patient wearing?: Pull over shirt    Upper body assist Assist Level: Set up assist    Lower Body Dressing/Undressing Lower body dressing      What is the patient wearing?: Pants     Lower body assist Assist for lower body dressing: Minimal Assistance - Patient > 75%     Toileting Toileting    Toileting assist Assist for toileting: Minimal Assistance - Patient > 75%     Transfers Chair/bed transfer  Transfers assist     Chair/bed transfer assist level: Contact Guard/Touching assist     Locomotion Ambulation   Ambulation assist      Assist level: Minimal Assistance - Patient > 75% Assistive device: Walker-rolling Max distance: 120'   Walk 10 feet activity   Assist     Assist level: Contact Guard/Touching assist Assistive device: Walker-rolling   Walk 50 feet activity   Assist    Assist level: Minimal Assistance - Patient > 75% Assistive device: Walker-rolling    Walk 150 feet activity   Assist Walk 150 feet  activity did not occur: Safety/medical concerns         Walk 10 feet on uneven surface  activity   Assist Walk 10 feet on uneven surfaces activity did not occur: Safety/medical concerns   Assist level: Moderate Assistance - Patient - 50 - 74% Assistive device: Photographer Will patient use wheelchair at discharge?: No Type of Wheelchair: Manual    Wheelchair assist level: Supervision/Verbal cueing Max wheelchair distance: 150    Wheelchair 50 feet with 2 turns activity    Assist        Assist  Level: Supervision/Verbal cueing   Wheelchair 150 feet activity     Assist  Wheelchair 150 feet activity did not occur: Safety/medical concerns   Assist Level: Supervision/Verbal cueing    Medical Problem List and Plan: 1.  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  Antithrombotics: -DVT/anticoagulation:  Pharmaceutical: Continue Lovenox             -antiplatelet therapy: N/A 3. Pain Management: Gabapentin tid 4. Mood: Team to provide ego support.              -antipsychotic agents: N/A 5. Neuropsych: This patient is not fully capable of making decisions on his own behalf. 6. Skin/Wound Care: Routine pressure relief measures. 7. Fluids/Electrolytes/Nutrition: Monitor I/Os 8. HTN: Monitor BP tid--continue Norvasc and coreg bid.   Controlled on 3/15  Monitor with increased mobility 9. HIV/AIDS: Now on Bitarvy (has been approved for assistance) with Bactrim daily for prophylaxis.             --Imodium prn for diarrhea 10. ETOH abuse with folate/thiamine deficiency:  Completed IV doses of folic acid/Thiamine              On folic acid ane thiamine for supplement.   Counsel 11. Hypomagnesemia:              Received 2 gram IV doses--03/04, 03/06 and 03/8.              Now on daily po supplement, changed on 3/9, increased on 3/15  IV magnesium 4 g ordered on 3/10, again on 3/14  Mag 1.9 on 3/15 12. GAD/depression: On Zoloft 100 mg bid with low dose ativan bid prn.  13. B/L foot drops: PRAFOs and AFOs  14 Encephalopathy-resolved. 15.  Hyponatremia  Sodium 135 on 3/15  Continue to monitor 16.  Transaminitis  AST elevated on 3/9  Continue to monitor 17.  Sinus tachycardia  Borderline on 3/15  Continue to monitor 18. Anemia:   Hb 8.3 on 3/14  Continue to monitor  LOS: 7 days A FACE TO FACE EVALUATION WAS PERFORMED  Ankit Karis Juba 08/22/2020, 10:12 AM

## 2020-08-23 DIAGNOSIS — D72819 Decreased white blood cell count, unspecified: Secondary | ICD-10-CM

## 2020-08-23 NOTE — Progress Notes (Signed)
Occupational Therapy Session Note  Patient Details  Name: Harold Flores MRN: [id number] Date of Birth: 29-Jun-1990  Today's Date: 08/23/2020 OT Individual Time: 2025-4270 OT Individual Time Calculation (min): 29 min    Short Term Goals: Week 1:  OT Short Term Goal 1 (Week 1): Pt will complete toilet transfer with min A + AE PRN OT Short Term Goal 2 (Week 1): Pt will complete grooming at sink in standing with min A for > 2 min with AE PRN. OT Short Term Goal 3 (Week 1): Pt will don pants with min A + AE PRN.   Skilled Therapeutic Interventions/Progress Updates:    Pt greeted at time of session semi reclined in bed talking with SO on computer. Agreeable to OT session, no pain. He is excited about new braces he is getting to help with walking. Supine > sit Mod I and sit > stand CGA, walked room > gym CGA with RW with occasional cues for clearing feet from floor and taking larger steps. In gym, performed  rebounder toss with 1kg ball 1x15 tosses, 1 set toss + overhead x15, 2 rounds standing tosses no UE support and Min-Mod A for standing balance. Pt self propel back to room Mod I and stand pivot to recliner CGA. Set up with alarm on call bell in reach. Discussion throughout session regarding DC planning and accommodations for return to work.   Therapy Documentation Precautions:  Precautions Precautions: Fall Precaution Comments: falls, B knee buckling Restrictions Weight Bearing Restrictions: No     Therapy/Group: Individual Therapy  Erasmo Score 08/23/2020, 4:31 PM

## 2020-08-23 NOTE — Patient Care Conference (Signed)
Inpatient RehabilitationTeam Conference and Plan of Care Update Date: 08/23/2020   Time: 11:33 AM    Patient Name: Harold Flores      Medical Record Number: [id number]  Date of Birth: Apr 04, 1991 Sex: Male         Room/Bed: 4W22C/4W22C-01 Payor Info: Payor: /    Admit Date/Time:  08/15/2020  5:24 PM  Primary Diagnosis:  Progressive focal motor weakness  Hospital Problems: Principal Problem:   Progressive focal motor weakness Active Problems:   Transaminitis   ETOH abuse   Sinus tachycardia   Essential hypertension   AIDS (HCC)   Anemia   Leukopenia    Expected Discharge Date: Expected Discharge Date: 08/31/20  Team Members Present: Physician leading conference: Dr. Maryla Morrow Care Coodinator Present: Dossie Der, LCSW;Deborah Lambert Mody, RN, BSN, CRRN Nurse Present: Luevenia Maxin, LPN PT Present: Sheran Lawless, PT OT Present: Rosalio Loud, OT PPS Coordinator present : Fae Pippin, [place], PT     Current Status/Progress Goal Weekly Team Focus  Bowel/Bladder   Patient is continent of bladder/bowel  Maintain regular bladder/bowel patterns while on IP Rehab  Assess toileting QS/PRN   Swallow/Nutrition/ Hydration             ADL's   UB bathing/dressing set up, LB bathing/dressing S/CG in stance.  functional transfers CGA with RW.  mild hand and core weakness  set up/supervision  adl and transfer training, DME, core stability and general conditioning, HEP, family teaching   Mobility   S to CGA transfers, gait CGA RW 150', w/c mobility S to Indep 150', 8 small steps with rails min A  S overall  LE strength, balance, coordination, initiating orthotic training, attempted NMES for DF activation   Communication             Safety/Cognition/ Behavioral Observations            Pain   Patient  verbalize discomfort pain to BLE, prn administered per orders  Pt will be free of pain, < 2  Assess pain QS/PRN   Skin   Skin intact, no skin irritation or breakage  wile on  IP Rehab  Pt will be free of skin breakdown and infection  Assess Skin QS/PRN     Discharge Planning:  HOme with Mom who has helht issues of her own, mom reports he can not stay with her long term due to in section 8 housing   Team Discussion: HTN controlled and NA level stable. LFTs trending down per MD and monitoring magnesium level with supplement and following med related leukocytopenia.   Patient on target to meet rehab goals: Currently CGA - Supervision transfers and shower transfers with min assist for clothes management and lower body bathing and dressing. Min assist for steps and help with ambulating. Supervision goals overall with PT  *See Care Plan and progress notes for long and short-term goals.   Revisions to Treatment Plan:  AFOs ordered for foot drop E-stim for foot drop  Teaching Needs: Transfers, toileting, medications, dietary modifications, etc.   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 as well as Etoh abuse  Barriers to Discharge: Medical stability;Decreased family/caregiver support;Other (comments)  Barriers to Discharge Comments: hx  of Etoh abuse Possible Resolutions to [employer] Focus: Therapies, follow labs - Mag, cont to supplement, leukopenia, counsel for Etoh abuse   Continued Need for Acute Rehabilitation Level of Care: The patient requires daily medical management by a physician with specialized training in physical medicine and rehabilitation for the following reasons: Direction of a multidisciplinary physical rehabilitation program to maximize functional independence : Yes Medical  management of patient stability for increased activity during participation in an intensive rehabilitation regime.: Yes Analysis of laboratory values and/or radiology reports with any subsequent need for medication adjustment and/or medical intervention. : Yes   I attest that I was present, lead the team conference, and concur with the assessment and plan of the team.   Chana Bode B 08/23/2020, 2:35 PM

## 2020-08-23 NOTE — Progress Notes (Signed)
Occupational Therapy Session Note  Patient Details  Name: Harold Flores MRN: [id number] Date of Birth: 08-30-1990  Today's Date: 08/23/2020 OT Individual Time: 0321-2248 OT Individual Time Calculation (min): 42 min    Short Term Goals: Week 1:  OT Short Term Goal 1 (Week 1): Pt will complete toilet transfer with min A + AE PRN OT Short Term Goal 2 (Week 1): Pt will complete grooming at sink in standing with min A for > 2 min with AE PRN. OT Short Term Goal 3 (Week 1): Pt will don pants with min A + AE PRN.  Skilled Therapeutic Interventions/Progress Updates:    Treatment session with focus on self-care retraining and functional mobility.  Pt received supine in bed agreeable to shower.  Pt completed bed mobility supervision.  Sit > stand with CGA to RW.  Pt reports initial weakness and instability in standing.  Pt ambulated to bathroom with RW with CGA.  Pt completed bathing with supervision at shower level with lateral leans when washing buttocks.  Pt returned to EOB with RW CGA to complete dressing from EOB.  Pt donned underwear with lateral leans and then stood with Min assist to pull pants over hips with UE support on RW.  Pt transferred squat pivot bed > w/c close supervision and completed grooming seated at sink.  Pt remained seated in w/c with chair alarm on and all needs in reach while completing grooming tasks.  Therapy Documentation Precautions:  Precautions Precautions: Fall Precaution Comments: falls, B knee buckling Restrictions Weight Bearing Restrictions: No General:   Vital Signs: Therapy Vitals Temp: 99 F (37.2 C) Pulse Rate: 91 Resp: 15 BP: 126/74 Patient Position (if appropriate): Lying Oxygen Therapy SpO2: 100 % O2 Device: Room Air Pain:  Pt reports some pain in toes, did not rate.   Therapy/Group: Individual Therapy  Rosalio Loud 08/23/2020, 8:11 AM

## 2020-08-23 NOTE — Progress Notes (Signed)
Physical Therapy Session Note  Patient Details  Name: Harold Flores MRN: [id number] Date of Birth: [birth year]  Today's Date: 08/23/2020 PT Individual Time:Session1: 2947-6546; [place]: 1100-1130; Session3: 5035-4656 PT Individual Time Calculation (min): 57 min & 30 min & 43 min  Short Term Goals: Week 2:  PT Short Term Goal 1 (Week 2): Patient will perform transfers consistently with S PT Short Term Goal 2 (Week 2): Patient will negotiate 4 steps with rails and S. PT Short Term Goal 3 (Week 2): Patient will ambulate with LRAD, AFO's and S x 150' PT Short Term Goal 4 (Week 2): Patient will perform floor transfers with S.  Skilled Therapeutic Interventions/Progress Updates:    Session1: Patient up in w/c finishing medication administration and washing up.  Patient sit to stand CGA to RW and ambulated to therapy gym with CGA.  Negotiated 4 (6") steps with rails and min A.  Cues for foot placement and increased time to rise initially.  Patient performed balance activities at counter including side stepping and stepping in and out of tile square alternating feet all with UE support.  Performed wall bumps with intermittent UE support and min A for balance cues for core activation, slow lowering to wall and keeping COG over BOS.  Patient on mat in tall kneeling for static ball lifts with ball at arms length, and for roll outs into modified plank position on ball 2 x 5 reps with manual cues for trunk extension throughout.  Patient performed ambulation on wall rail in hallway with 1 UE support and min A x 60' switching which hand on rail half way.  Patient seated in w/c propelled to room keeping legs up as legrests in room with S x 150'.  Performed squat pivot to bed using bed rail with S.  Sit to supine S and left with call bell and needs in reach and bed alarm activated.   Session2: Patient in supine and supine to sit S, donned shoes sitting EOB with S and transferred to w/c S.  Patient propelled to  gym with S.  Performed transfer to mat with S and cues for armrest management.  Discussed fall recovery and performed transfer with cues and S after demonstration.  Discussed safety in regards to recovering from a fall only if feeling well without pain and supervised, etc.  Educated in fall prevention with lighting, clear pathways, footwear, keeping items frequently used within reach and issued handout on fall prevention.  Patient ambulated to room with CGA using RW and CGA.  Sit to supine S and left with call bell/needs in reach and bed alarm active.   Session3: Patient in supine and performed supine to sit with S.  Squat pivot to w/c with S.  Performed w/c mobility to therapy gym independent 150.  Squat pivot to mat with S including w/c set up.  Patient seated for donning AFO's brought by orthotist initially with anterior plate and pt ambulated x 175' with RW and CGA noting increased posterior positioning of knee with hyperextension and continued hip flexion throughout.  CPO added heel wedge and pt ambulated 12' with RW and CGA noting slight improvement but still pushed back into hyperextension throughout.  CPO applied posterior leaf spring with heel wedges and pt remained with posterior positioning of hips though continued with education and facilitation for pushing through with knee flexion and improved hip extension.  CPO applied AFO to R with stiffer footplate for less knee extension in IC and pt ambulated x 80'  with RW and CGA.  CPO discussed bringing 3/8" heel wedge along with bilateral posterior AFO's next day.  Patient propelled w/c to room and transferred to bed with CGA.  Left with call bell and needs in reach and bed alarm active.   Therapy Documentation Precautions:  Precautions Precautions: Fall Precaution Comments: falls, B knee buckling Restrictions Weight Bearing Restrictions: No Pain: Pain Assessment Pain Scale: 0-10 Pain Score: 0-No pain Faces Pain Scale: Hurts a little bit Pain  Type: Acute pain Pain Location: Generalized Pain Descriptors / Indicators: Sore Pain Onset: With Activity Pain Intervention(s): Ambulation/increased activity   Therapy/Group: Individual Therapy  Elray Mcgregor  Sheran Lawless, PT 08/23/2020, 9:31 AM

## 2020-08-23 NOTE — Progress Notes (Signed)
[facility] PHYSICAL MEDICINE & REHABILITATION PROGRESS NOTE  Subjective/Complaints: Patient seen working with therapy this morning.  He states he slept well overnight.  Discussed AFO with therapies, plan for delivery today.  ROS: Denies CP, SOB, N/V/D  Objective: Vital Signs: Blood pressure 126/74, pulse 91, temperature 99 F (37.2 C), resp. rate 15, height 5\' 10"  (1.778 m), weight 77.9 kg, SpO2 100 %. No results found. Recent Labs    08/21/20 0547  WBC 3.2*  HGB 8.3*  HCT 23.6*  PLT 374   Recent Labs    08/21/20 0547  NA 135  K 3.8  CL 103  CO2 26  GLUCOSE 89  BUN 11  CREATININE 0.70  CALCIUM 9.2    Intake/Output Summary (Last 24 hours) at 08/23/2020 1020 Last data filed at 08/23/2020 0818 Gross per 24 hour  Intake 478 ml  Output 825 ml  Net -347 ml        Physical Exam: BP 126/74 (BP Location: Left Arm)   Pulse 91   Temp 99 F (37.2 C)   Resp 15   Ht 5\' 10"  (1.778 m)   Wt 77.9 kg   SpO2 100%   BMI 24.64 kg/m   Constitutional: No distress . Vital signs reviewed. HENT: Normocephalic.  Atraumatic. Eyes: EOMI. No discharge. Cardiovascular: No JVD.  RRR. Respiratory: Normal effort.  No stridor.  Bilateral clear to auscultation. GI: Non-distended.  BS +. Skin: Warm and dry.  Intact. Psych: Normal mood.  Normal behavior. Musc: No edema in extremities.  No tenderness in extremities. Neuro: Alert Motor:  Bilateral UE: 4+/5 proximal to distal Bilateral lower extremities: Hip flexion, knee extension 4/5, ankle dorsiflexion 2-/5, unchanged Sensation diminished to light touch bilateral feet  Assessment/Plan: 1. Functional deficits which require 3+ hours per day of interdisciplinary therapy in a comprehensive inpatient rehab setting.  Physiatrist is providing close team supervision and 24 hour management of active medical problems listed below.  Physiatrist and rehab team continue to assess barriers to discharge/monitor patient progress toward functional  and medical goals   Care Tool:  Bathing    Body parts bathed by patient: Right arm,Left arm,Front perineal area,Chest,Abdomen,Right upper leg,Left upper leg,Right lower leg,Left lower leg,Face,Buttocks   Body parts bathed by helper: Buttocks     Bathing assist Assist Level: Supervision/Verbal cueing     Upper Body Dressing/Undressing Upper body dressing   What is the patient wearing?: Pull over shirt    Upper body assist Assist Level: Set up assist    Lower Body Dressing/Undressing Lower body dressing      What is the patient wearing?: Underwear/pull up,Pants     Lower body assist Assist for lower body dressing: Minimal Assistance - Patient > 75%     Toileting Toileting    Toileting assist Assist for toileting: Minimal Assistance - Patient > 75%     Transfers Chair/bed transfer  Transfers assist     Chair/bed transfer assist level: Contact Guard/Touching assist     Locomotion Ambulation   Ambulation assist      Assist level: Minimal Assistance - Patient > 75% Assistive device: Walker-rolling Max distance: 150'   Walk 10 feet activity   Assist     Assist level: Contact Guard/Touching assist Assistive device: Walker-rolling   Walk 50 feet activity   Assist    Assist level: Contact Guard/Touching assist Assistive device: Walker-rolling    Walk 150 feet activity   Assist Walk 150 feet activity did not occur: Safety/medical concerns  Assist level: Minimal  Assistance - Patient > 75% Assistive device: Walker-rolling    Walk 10 feet on uneven surface  activity   Assist Walk 10 feet on uneven surfaces activity did not occur: Safety/medical concerns   Assist level: Moderate Assistance - Patient - 50 - 74% Assistive device: Photographer Will patient use wheelchair at discharge?: No Type of Wheelchair: Manual    Wheelchair assist level: Supervision/Verbal cueing Max wheelchair distance: 150     Wheelchair 50 feet with 2 turns activity    Assist        Assist Level: Supervision/Verbal cueing   Wheelchair 150 feet activity     Assist  Wheelchair 150 feet activity did not occur: Safety/medical concerns   Assist Level: Supervision/Verbal cueing    Medical Problem List and Plan: 1.  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 Please see conference note from today as well.  2.  Antithrombotics: -DVT/anticoagulation:  Pharmaceutical: Continue Lovenox             -antiplatelet therapy: N/A 3. Pain Management: Gabapentin tid 4. Mood: Team to provide ego support.              -antipsychotic agents: N/A 5. Neuropsych: This patient is not fully capable of making decisions on his own behalf. 6. Skin/Wound Care: Routine pressure relief measures. 7. Fluids/Electrolytes/Nutrition: Monitor I/Os 8. HTN: Monitor BP tid--continue Norvasc and coreg bid.   Controlled on 3/16  Monitor with increased mobility 9. HIV/AIDS: Now on Bitarvy (has been approved for assistance) with Bactrim daily for prophylaxis.             --Imodium prn for diarrhea 10. ETOH abuse with folate/thiamine deficiency:  Completed IV doses of folic acid/Thiamine              On folic acid ane thiamine for supplement.   Counsel 11. Hypomagnesemia:              Received 2 gram IV doses--03/04, 03/06 and 03/8.              Now on daily po supplement, changed on 3/9, increased on 3/15  IV magnesium 4 g ordered on 3/10, again on 3/14  Mag 1.9 on 3/15, plan to repeat labs end of this week 12. GAD/depression: On Zoloft 100 mg bid with low dose ativan bid prn.  13. B/L foot drops: PRAFOs and  AFOs  14 Encephalopathy-resolved. 15.  Hyponatremia  Sodium 135 on 3/15  Continue to monitor 16.  Transaminitis  AST elevated on 3/9  Continue to monitor 17.  Sinus tachycardia  Borderline on 3/15  Continue to monitor 18. Anemia:   Hb 8.3 on 3/14, labs ordered for tomorrow  Continue to monitor 19.  Leukopenia-likely related to medications  WBC 3.2 on 3/14, labs ordered for tomorrow  LOS: 8 days A FACE TO FACE EVALUATION WAS PERFORMED  Karliah Kowalchuk Karis Juba 08/23/2020, 10:20 AM

## 2020-08-23 NOTE — Progress Notes (Signed)
Recreational Therapy Session Note  Patient Details  Name: Harold Flores MRN: [id number] Date of Birth: 1991-05-20 Today's Date: 08/23/2020  Pain: no c/o Skilled Therapeutic Interventions/Progress Updates: Session focused on coping strategies & discharge planning.  Pt reports feeling better and is able to see some progress which is encouraging.  Pt someway frustrated by his husband and mother not visiting in person.  However, pt does state that his dad has been present and supportive during his hospitalization.  Discussed the importance of social, emotional & spiritual health as well as the physical and problem solved how he could address these areas of health going forward.  Pt stated excitement about receiving AFOs today and how they will enable him to walk more safely, while also admitting he has a fear of falling.  Discussed ways to reduce fall risks at home (pet/cat safety) and energy awareness/conservation.  Pt stated understanding.  Therapy/Group: Individual Therapy   Emile Kyllo 08/23/2020, 11:11 AM

## 2020-08-23 NOTE — Progress Notes (Signed)
Patient ID: Harold Flores, male   DOB: 05-04-1991, 30 y.o.   MRN: [id number]  Met with pt to discuss team conference goals of supervision-mod/i level and target discharge date of 3/24. He feels she is doing well he is tired ut continues to push himself with therapies. He plans to go back to work and has paperwork for MD to complete. Left on MD's desk. He reports his job will make accommodations for him to return. Discussed applying for disability, medicaid, food stamps and section 8 housing. He is aware of this and knows his Mom is pushing him to do this. He gave permission to call mom back to address her questions and let her know pt is ding well and making progress here. Will await equipment needs and work on discharge needs.

## 2020-08-24 LAB — CBC WITH DIFFERENTIAL/PLATELET
Abs Immature Granulocytes: 0.04 10*3/uL (ref 0.00–0.07)
Basophils Absolute: 0 10*3/uL (ref 0.0–0.1)
Basophils Relative: 1 %
Eosinophils Absolute: 0.1 10*3/uL (ref 0.0–0.5)
Eosinophils Relative: 2 %
HCT: 23.9 % — ABNORMAL LOW (ref 39.0–52.0)
Hemoglobin: 8.1 g/dL — ABNORMAL LOW (ref 13.0–17.0)
Immature Granulocytes: 1 %
Lymphocytes Relative: 43 %
Lymphs Abs: 1.4 10*3/uL (ref 0.7–4.0)
MCH: 32 pg (ref 26.0–34.0)
MCHC: 33.9 g/dL (ref 30.0–36.0)
MCV: 94.5 fL (ref 80.0–100.0)
Monocytes Absolute: 0.7 10*3/uL (ref 0.1–1.0)
Monocytes Relative: 20 %
Neutro Abs: 1.1 10*3/uL — ABNORMAL LOW (ref 1.7–7.7)
Neutrophils Relative %: 33 %
Platelets: 429 10*3/uL — ABNORMAL HIGH (ref 150–400)
RBC: 2.53 MIL/uL — ABNORMAL LOW (ref 4.22–5.81)
RDW: 16.1 % — ABNORMAL HIGH (ref 11.5–15.5)
WBC: 3.3 10*3/uL — ABNORMAL LOW (ref 4.0–10.5)
nRBC: 0 % (ref 0.0–0.2)

## 2020-08-24 MED ORDER — MAGNESIUM GLUCONATE 500 MG PO TABS
500.0000 mg | ORAL_TABLET | Freq: Two times a day (BID) | ORAL | Status: DC
  Administered 2020-08-24 – 2020-08-25 (×2): 500 mg via ORAL
  Filled 2020-08-24 (×3): qty 1

## 2020-08-24 MED ORDER — GERHARDT'S BUTT CREAM: 1.0000 "application " | TOPICAL_CREAM | Freq: Three times a day (TID) | CUTANEOUS | Status: DC | PRN

## 2020-08-24 MED ORDER — ADULT MULTIVITAMIN W/MINERALS CH
1.0000 | ORAL_TABLET | Freq: Every day | ORAL | Status: DC
  Administered 2020-08-25 – 2020-08-31 (×7): 1 via ORAL
  Filled 2020-08-24 (×7): qty 1

## 2020-08-24 MED ORDER — ENSURE ENLIVE PO LIQD: 237.0000 mL | Freq: Two times a day (BID) | ORAL | Status: DC | PRN

## 2020-08-24 NOTE — Progress Notes (Signed)
Physical Therapy Session Note  Patient Details  Name: Harold Flores MRN: [id number] Date of Birth: 01/02/1991  Today's Date: 08/24/2020 PT Individual Time:Session1: 2595-6387; Session2: 1300-1400 PT Individual Time Calculation (min): 72 min & 60 min  Short Term Goals: Week 2:  PT Short Term Goal 1 (Week 2): Patient will perform transfers consistently with S PT Short Term Goal 2 (Week 2): Patient will negotiate 4 steps with rails and S. PT Short Term Goal 3 (Week 2): Patient will ambulate with LRAD, AFO's and S x 150' PT Short Term Goal 4 (Week 2): Patient will perform floor transfers with S.  Skilled Therapeutic Interventions/Progress Updates:    Session1:  Patient seated EOB and reports slept well.  Donned shoes with S.  Squat pivot to w/c S.  Propelled in w/c to therapy gym 150'.  Sit to stand to RW CGA.  Standing back against wall for neutral pelvis and knee flexion x 30 sec trials x 2.  Wall slides x 10 cues for knee position, then x 10 with ball between knees with hands on RW for safety and CGA. Patient performed hamstring strengthening activity pulling w/c x 70'.  Transferred to mat with S.  In quadruped patient performed hip extension alternating x 10, then alternating opposite arm and leg lift w/ 3 sec hold x 10 with min A for balance and cues for core activation.  From elevated mat performed stand to partial sit x 10 with mod A with limited UE support.  Propelled in w/c to dayroom with S and stand step to Kinetron with RW and S.  Performed 1-2 minutes bouts x 3 reps on Kinetron in standing with 20-30 cm/sec.  Patient propelled in w/c to ortho gym.  Performed standing balance activity using RW then QC for support standing to tap targets across screen on BITS with close S to CGA, LOB x 1 with cane and min A to recover.  Patient propelled to room with S transferred to bed via squat pivot with S.  Dad in the room and SW made aware.  Left with call bell in reach and family in the room.    Session2:  Patient getting fitted for AFO's with CPO upon my arrival.  Patient sit to stand to RW and ambulated to gym x 150' with RW and mod facilitation for hip extension and pushing into flexion at knees.  CPO reports has heel wedges in shoes as well.  CPO educated pt better with shoes that have thicker sole and more cushioning.  Patient educated in wear time and checking skin as braces can cause breakdown with changes in sensation.  Patient performed standing balance trials with RW then in parallel bars wearing braces standing without UE support up to 24 seconds.  Forward ambulation in bars with minimal UE support and min A for safety cues for pushing through braces and increased forward momentum.  Patient ambulated on wall rail with CGA several passes switching from L to R hand support with facilitation again through pelvis and hips for extension and moving forward over feet in stance.  Patient ambulated with RW to room 130' with min A.  Removed braces and no signs of redness or skin issues.  Patient left in supine with all needs in reach and bed alarm active.   Therapy Documentation Precautions:  Precautions Precautions: Fall Precaution Comments: falls, B knee buckling Restrictions Weight Bearing Restrictions: No Pain: Pain Assessment Pain Scale: 0-10 Pain Score: 4  Pain Location: Foot Pain Orientation: Right;Left  Pain Descriptors / Indicators: Tingling Pain Onset: On-going   Therapy/Group: Individual Therapy  Elray Mcgregor  Sheran Lawless, PT 08/24/2020, 10:24 AM

## 2020-08-24 NOTE — Progress Notes (Signed)
Occupational Therapy Session Note  Patient Details  Name: Harold Flores MRN: [id number] Date of Birth: May 17, 1991  Today's Date: 08/24/2020 OT Individual Time: 1115-1200 OT Individual Time Calculation (min): 45 min    Short Term Goals: Week 1:  OT Short Term Goal 1 (Week 1): Pt will complete toilet transfer with min A + AE PRN OT Short Term Goal 2 (Week 1): Pt will complete grooming at sink in standing with min A for > 2 min with AE PRN. OT Short Term Goal 3 (Week 1): Pt will don pants with min A + AE PRN.  Skilled Therapeutic Interventions/Progress Updates:    Patient seated in w/c, ready for therapy and denies pain.  He is able to propel w/c to and from therapy treatment areas, lock brakes and manage leg rests.  Reviewed and practiced DME options for bath - discussed and demonstrated set up of shower bench, shower seat with back and 3in1 commode.  He was able to complete SPT to/from each piece of equipment with CG/CS with RW.   He prefers shower seat with back over transfer bench but will have family measure tub and check availability to borrow one from a family member.  Trailed getting up from 17 inch surface to simulate lower toilet surface - he was able to stand with moderate struggle and CG/min A.  He plans to have family measure toilet height at home.  Reviewed raised toilet seat and commode options.  He remained seated in w/c at close of session, call bell and tray table in reach.    Therapy Documentation Precautions:  Precautions Precautions: Fall Precaution Comments: falls, B knee buckling Restrictions Weight Bearing Restrictions: No   Therapy/Group: Individual Therapy  [name] 08/24/2020, 9:03 AM

## 2020-08-24 NOTE — Progress Notes (Signed)
Occupational Therapy Session Note  Patient Details  Name: Harold Flores MRN: [id number] Date of Birth: [birth year]  Today's Date: 08/24/2020 OT Individual Time: 5462-7035 OT Individual Time Calculation (min): 34 min    Short Term Goals: Week 1:  OT Short Term Goal 1 (Week 1): Pt will complete toilet transfer with min A + AE PRN OT Short Term Goal 2 (Week 1): Pt will complete grooming at sink in standing with min A for > 2 min with AE PRN. OT Short Term Goal 3 (Week 1): Pt will don pants with min A + AE PRN.  Skilled Therapeutic Interventions/Progress Updates:    Pt received semi-reclined in bed, agreeable to therapy. Denies pain, but reports constant tingling in hands and feet. Completes bed mobility mod I. Donned underwear via lateral leans with distant S, donned shirt and B shoes set-up A, and donned pants with CGA for STS with RW, pt able to correct posterior bias in standing and light CGA. Stand-pivot to w/c CGA with RW. Completed grooming/oral care seated in w/c at sink mod I. Toilet transfer with CGA and use of front grab bar from w/c. CGA during stance for LB clothing management. Cont void of bowel. Distant S for seated peri care and CGA to transfer back to w/c. CGA Stand-pivot back to bed no AD.  Pt left semi-reclined in bed with bed alarm engaged, call bell in reach, and all immediate needs met.    Therapy Documentation Precautions:  Precautions Precautions: Fall Precaution Comments: falls, B knee buckling Restrictions Weight Bearing Restrictions: No Pain: Pain Assessment Pain Scale: 0-10 Pain Score: 0-No pain ADL: See Care Tool for more details.  Therapy/Group: Individual Therapy  Volanda Napoleon MS, OTR/L  08/24/2020, 8:39 AM

## 2020-08-24 NOTE — Progress Notes (Signed)
Recreational Therapy Session Note  Patient Details  Name: Harold Flores MRN: [id number] Date of Birth: 11-11-1990 Today's Date: 08/24/2020  Pain: no c/o Skilled Therapeutic Interventions/Progress Updates: Discussion continued today on discharge planning including review of therapy goals and current status.  Discussed various concerns around discharge planning including applying for disability and medicaid, finding a place to live long term, being physically separated from husband due to housing issues during his hospitalization and limited social support.  Pt states its going to be ok but admits he is not sure how it will be ok.  Discussion included identification of what he could control and identifying strategies to assist him in problem solving through obstacles.  Also discussed the possibility of community reintegration/outing prior to discharge and pt agreeable to participated if offered.   Therapy/Group: Individual Therapy Kashara Blocher 08/24/2020, 4:09 PM

## 2020-08-24 NOTE — Progress Notes (Signed)
Occupational Therapy Weekly Progress Note  Patient Details  Name: KERNEY HOPFENSPERGER MRN: [id number] Date of Birth: [birth year]  Beginning of progress report period: August 16, 2020 End of progress report period: August 24, 2020     Patient has met 3 of 3 short term goals.  Patient with improving strength, balance and activity tolerance.  Patient continues to demonstrate the following deficits: muscle weakness, decreased cardiorespiratoy endurance, unbalanced muscle activation and decreased coordination and decreased standing balance and decreased postural control and therefore will continue to benefit from skilled OT intervention to enhance overall performance with BADL, iADL and Vocation.  Patient progressing toward long term goals..  Continue plan of care.  OT Short Term Goals Week 1:  OT Short Term Goal 1 (Week 1): Pt will complete toilet transfer with min A + AE PRN OT Short Term Goal 1 - Progress (Week 1): Met OT Short Term Goal 2 (Week 1): Pt will complete grooming at sink in standing with min A for > 2 min with AE PRN. OT Short Term Goal 2 - Progress (Week 1): Met OT Short Term Goal 3 (Week 1): Pt will don pants with min A + AE PRN. OT Short Term Goal 3 - Progress (Week 1): Met Week 2:  OT Short Term Goal 1 (Week 2): patient will complete functional transfers to/from appropriate DME set up/mod I level with RW OT Short Term Goal 2 (Week 2): patient will complete basic HM with RW set up/mod I level OT Short Term Goal 3 (Week 2): patient will complete adl in seated postion set up/mod I level    Carlos Levering 08/24/2020, 12:31 PM

## 2020-08-24 NOTE — Progress Notes (Signed)
Patient ID: Harold Flores, male   DOB: [birth year], 30 y.o.   MRN: [id number] Met with pt to discuss applying for disability and medicaid and [employer] assist came into the room to discuss applying for Medicaid. Pt will begin the process. He does want to return to work when able and have given MD paperwork to complete for this. Spoke with Mom via telephone to answer her questions she feels it is important for him to apply for disability and Medicaid due to realizes how long the process can take due to she has been through it. She also wants him to apply for housing aware to go through section 8 for this. Mom feels pt does not realize he may not be able to go back to work anytime soon. Will work on discharge needs and see if pt will begin applications for SSD and Medicaid.

## 2020-08-24 NOTE — Progress Notes (Signed)
[place] PHYSICAL MEDICINE & REHABILITATION PROGRESS NOTE  Subjective/Complaints: He has no complaints this morning He asks about release form to work- he has given to SW- discussed that I can complete this for him. Plan for return to work 1 month from admission date.   ROS: Denies CP, SOB, N/V/D  Objective: Vital Signs: Blood pressure 123/83, pulse 92, temperature 98.6 F (37 C), temperature source Oral, resp. rate 16, height 5\' 10"  (1.778 m), weight 77.9 kg, SpO2 100 %. No results found. Recent Labs    08/24/20 0529  WBC 3.3*  HGB 8.1*  HCT 23.9*  PLT 429*   No results for input(s): NA, K, CL, CO2, GLUCOSE, BUN, CREATININE, CALCIUM in the last 72 hours.  Intake/Output Summary (Last 24 hours) at 08/24/2020 1256 Last data filed at 08/24/2020 0800 Gross per 24 hour  Intake 620 ml  Output 850 ml  Net -230 ml        Physical Exam: BP 123/83 (BP Location: Left Arm)   Pulse 92   Temp 98.6 F (37 C) (Oral)   Resp 16   Ht 5\' 10"  (1.778 m)   Wt 77.9 kg   SpO2 100%   BMI 24.64 kg/m   Gen: no distress, normal appearing HEENT: oral mucosa pink and moist, NCAT Cardio: Reg rate Chest: normal effort, normal rate of breathing Abd: soft, non-distended Ext: no edema Psych: pleasant, normal affect Skin: intact Alert Motor:  Bilateral UE: 4+/5 proximal to distal Bilateral lower extremities: Hip flexion, knee extension 4/5, ankle dorsiflexion 2-/5, unchanged Sensation diminished to light touch bilateral feet  Assessment/Plan: 1. Functional deficits which require 3+ hours per day of interdisciplinary therapy in a comprehensive inpatient rehab setting.  Physiatrist is providing close team supervision and 24 hour management of active medical problems listed below.  Physiatrist and rehab team continue to assess barriers to discharge/monitor patient progress toward functional and medical goals   Care Tool:  Bathing    Body parts bathed by patient: Right arm,Left  arm,Front perineal area,Chest,Abdomen,Right upper leg,Left upper leg,Right lower leg,Left lower leg,Face,Buttocks   Body parts bathed by helper: Buttocks     Bathing assist Assist Level: Supervision/Verbal cueing     Upper Body Dressing/Undressing Upper body dressing   What is the patient wearing?: Pull over shirt    Upper body assist Assist Level: Set up assist    Lower Body Dressing/Undressing Lower body dressing      What is the patient wearing?: Underwear/pull up,Pants     Lower body assist Assist for lower body dressing: Contact Guard/Touching assist     Toileting Toileting    Toileting assist Assist for toileting: Contact Guard/Touching assist     Transfers Chair/bed transfer  Transfers assist     Chair/bed transfer assist level: Contact Guard/Touching assist     Locomotion Ambulation   Ambulation assist      Assist level: Contact Guard/Touching assist Assistive device: Walker-rolling Max distance: 150'   Walk 10 feet activity   Assist     Assist level: Contact Guard/Touching assist Assistive device: Walker-rolling   Walk 50 feet activity   Assist    Assist level: Contact Guard/Touching assist Assistive device: Walker-rolling    Walk 150 feet activity   Assist Walk 150 feet activity did not occur: Safety/medical concerns  Assist level: Contact Guard/Touching assist Assistive device: Walker-rolling    Walk 10 feet on uneven surface  activity   Assist Walk 10 feet on uneven surfaces activity did not occur: Safety/medical concerns  Assist level: Moderate Assistance - Patient - 50 - 74% Assistive device: Photographer Will patient use wheelchair at discharge?: No Type of Wheelchair: Manual    Wheelchair assist level: Supervision/Verbal cueing Max wheelchair distance: 150    Wheelchair 50 feet with 2 turns activity    Assist        Assist Level: Supervision/Verbal cueing    Wheelchair 150 feet activity     Assist  Wheelchair 150 feet activity did not occur: Safety/medical concerns   Assist Level: Supervision/Verbal cueing    Medical Problem List and Plan: 1.  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  Antithrombotics: -DVT/anticoagulation:  Pharmaceutical: D/c Lovenox- ambulatung >300 feet             -antiplatelet therapy: N/A 3. Pain Management: Gabapentin tid 4. Mood: Team to provide ego support.              -antipsychotic agents: N/A 5. Neuropsych: This patient is not fully capable of making decisions on his own behalf. 6. Skin/Wound Care: Routine pressure relief measures. 7. Fluids/Electrolytes/Nutrition: Monitor I/Os 8. HTN: Monitor BP tid--continue Norvasc and coreg bid.   Controlled on 3/16  Monitor with increased mobility 9. HIV/AIDS: Now on Bitarvy (has been approved for assistance) with Bactrim daily for prophylaxis.  -repeat Tcell count as per patient's request tomorrow             --Imodium prn for diarrhea 10. ETOH abuse with folate/thiamine deficiency:  Completed IV doses of folic acid/Thiamine              On folic acid ane thiamine for supplement.   Counsel 11. Hypomagnesemia:              Received 2 gram IV doses--03/04, 03/06 and 03/8.              Now on daily po supplement, changed on 3/9, increased on 3/15  IV magnesium 4 g ordered on 3/10, again on 3/14  Mag 1.9 on 3/15, repeat tomorrow.  12. GAD/depression: On Zoloft 100 mg bid with low dose ativan bid prn.  13. B/L foot drops: PRAFOs and AFOs  14 Encephalopathy-resolved. 15.  Hyponatremia  Sodium 135 on 3/15  Continue to monitor 16.  Transaminitis  AST elevated on 3/9  Continue to monitor 17.  Sinus tachycardia  Borderline on 3/15  Continue to monitor 18. Anemia:   Hb 8.3 on 3/14, 8.1 on 3/17  Continue to  monitor 19.  Leukopenia-likely related to medications  WBC 3.2 on 3/14, 3.3 on 3/17  LOS: 9 days A FACE TO FACE EVALUATION WAS PERFORMED  Harold Flores 08/24/2020, 12:56 PM

## 2020-08-25 DIAGNOSIS — G610 Guillain-Barre syndrome: Secondary | ICD-10-CM

## 2020-08-25 LAB — MAGNESIUM: Magnesium: 1.5 mg/dL — ABNORMAL LOW (ref 1.7–2.4)

## 2020-08-25 LAB — T-HELPER CELLS (CD4) COUNT (NOT AT ARMC)
CD4 % Helper T Cell: 9 % — ABNORMAL LOW (ref 33–65)
CD4 T Cell Abs: 132 /uL — ABNORMAL LOW (ref 400–1790)

## 2020-08-25 MED ORDER — MAGNESIUM GLUCONATE 500 MG PO TABS
500.0000 mg | ORAL_TABLET | Freq: Three times a day (TID) | ORAL | Status: DC
  Administered 2020-08-25 – 2020-08-31 (×17): 500 mg via ORAL
  Filled 2020-08-25 (×20): qty 1

## 2020-08-25 MED ORDER — MAGNESIUM SULFATE 4 GM/100ML IV SOLN
4.0000 g | Freq: Once | INTRAVENOUS | Status: AC
  Administered 2020-08-25: 4 g via INTRAVENOUS
  Filled 2020-08-25: qty 100

## 2020-08-25 NOTE — Progress Notes (Signed)
Pt mother called with questions about pt receiving covid vaccination. PA Pam notified and discussed given pt current illnesses. PA Pam advised to not administer covid vaccination at this time. Family updated on PA response.  Mylo Red, LPN

## 2020-08-25 NOTE — Progress Notes (Signed)
Occupational Therapy Session Note  Patient Details  Name: Harold Flores MRN: [id number] Date of Birth: August 27, 1990  Today's Date: 08/25/2020 OT Individual Time: 1000-1100 OT Individual Time Calculation (min): 60 min    Short Term Goals: Week 1:  OT Short Term Goal 1 (Week 1): Pt will complete toilet transfer with min A + AE PRN OT Short Term Goal 1 - Progress (Week 1): Met OT Short Term Goal 2 (Week 1): Pt will complete grooming at sink in standing with min A for > 2 min with AE PRN. OT Short Term Goal 2 - Progress (Week 1): Met OT Short Term Goal 3 (Week 1): Pt will don pants with min A + AE PRN. OT Short Term Goal 3 - Progress (Week 1): Met  Skilled Therapeutic Interventions/Progress Updates:    Pt participated in rhythmic drumming group. Pain not reported during session. Focus of group on BUE coordination, strengthening, endurance, timing/control, activity tolerance, and social participation and engagement. Pt performs session from seated position for energy conservation. Skilled interventions included grading movements up to improve end range and strength of BUE. Warm up performed prior to exercises and UB stretching completed at end of group with demo from OT. Pt able to select preferred song to share with group. Pt offered standing option but pt declined. Returned pt to room at end of session. Exited session with pt seated in w/c, exit alarm on and call light in reach  Therapy Documentation Precautions:  Precautions Precautions: Fall Precaution Comments: falls, B knee buckling Restrictions Weight Bearing Restrictions: No General:   Vital Signs: Therapy Vitals Temp: 99.3 F (37.4 C) Pulse Rate: 92 Resp: 18 BP: 127/76 Patient Position (if appropriate): Lying Oxygen Therapy SpO2: 100 % O2 Device: Room Air Pain:   ADL: ADL Eating: Set up Where Assessed-Eating: Wheelchair Grooming: Setup Where Assessed-Grooming: Sitting at [employer] Upper Body Bathing:  Supervision/safety Where Assessed-Upper Body Bathing: Shower Lower Body Bathing: Minimal assistance Where Assessed-Lower Body Bathing: Shower Upper Body Dressing: Minimal assistance Where Assessed-Upper Body Dressing: Edge of bed Lower Body Dressing: Moderate assistance Where Assessed-Lower Body Dressing: Edge of bed Toileting: Moderate assistance Where Assessed-Toileting: Glass blower/designer: Moderate assistance Toilet Transfer Method: Stand pivot Toilet Transfer Equipment: Bedside commode,Grab bars Social research officer, government: Moderate assistance Social research officer, government Method: Radiographer, therapeutic: Nurse, learning disability    Praxis   Exercises:   Other Treatments:     Therapy/Group: Group Therapy  Tonny Branch 08/25/2020, 6:38 AM

## 2020-08-25 NOTE — Progress Notes (Signed)
[place] PHYSICAL MEDICINE & REHABILITATION PROGRESS NOTE  Subjective/Complaints: Harold Flores does not like his IV- says on his daily lab draws they have not been using it, it is not being used, may d/c Mag is 1.5- supplement 4mg  today, increase oral to TID, provided with list of magnesium rich foods, encouraged avoiding alcohol at home.   ROS: Denies CP, SOB, N/V/D  Objective: Vital Signs: Blood pressure 127/76, pulse 92, temperature 99.3 F (37.4 C), resp. rate 18, height 5\' 10"  (1.778 m), weight 77.9 kg, SpO2 100 %. No results found. Recent Labs    08/24/20 0529  WBC 3.3*  HGB 8.1*  HCT 23.9*  PLT 429*   No results for input(s): NA, K, CL, CO2, GLUCOSE, BUN, CREATININE, CALCIUM in the last 72 hours.  Intake/Output Summary (Last 24 hours) at 08/25/2020 1338 Last data filed at 08/25/2020 0734 Gross per 24 hour  Intake 600 ml  Output 850 ml  Net -250 ml        Physical Exam: BP 127/76 (BP Location: Right Arm)   Pulse 92   Temp 99.3 F (37.4 C)   Resp 18   Ht 5\' 10"  (1.778 m)   Wt 77.9 kg   SpO2 100%   BMI 24.64 kg/m   Gen: no distress, normal appearing HEENT: oral mucosa pink and moist, NCAT Cardio: Reg rate Chest: normal effort, normal rate of breathing Abd: soft, non-distended Ext: no edema Psych: pleasant, normal affect Skin: intact Alert Motor:  Bilateral UE: 4+/5 proximal to distal Bilateral lower extremities: Hip flexion, knee extension 4/5, ankle dorsiflexion 2-/5, unchanged Sensation diminished to light touch bilateral feet  Assessment/Plan: 1. Functional deficits which require 3+ hours per day of interdisciplinary therapy in a comprehensive inpatient rehab setting.  Physiatrist is providing close team supervision and 24 hour management of active medical problems listed below.  Physiatrist and rehab team continue to assess barriers to discharge/monitor patient progress toward functional and medical goals   Care Tool:  Bathing    Body  parts bathed by patient: Right arm,Left arm,Front perineal area,Chest,Abdomen,Right upper leg,Left upper leg,Right lower leg,Left lower leg,Face,Buttocks   Body parts bathed by helper: Buttocks     Bathing assist Assist Level: Supervision/Verbal cueing     Upper Body Dressing/Undressing Upper body dressing   What is the patient wearing?: Pull over shirt    Upper body assist Assist Level: Set up assist    Lower Body Dressing/Undressing Lower body dressing      What is the patient wearing?: Underwear/pull up,Pants     Lower body assist Assist for lower body dressing: Contact Guard/Touching assist     Toileting Toileting    Toileting assist Assist for toileting: Contact Guard/Touching assist     Transfers Chair/bed transfer  Transfers assist     Chair/bed transfer assist level: Supervision/Verbal cueing     Locomotion Ambulation   Ambulation assist      Assist level: Minimal Assistance - Patient > 75% Assistive device: Orthosis (and bilateral AFO) Max distance: 150'   Walk 10 feet activity   Assist     Assist level: Minimal Assistance - Patient > 75% Assistive device: Walker-rolling,Orthosis   Walk 50 feet activity   Assist    Assist level: Minimal Assistance - Patient > 75% Assistive device: Walker-rolling,Orthosis    Walk 150 feet activity   Assist Walk 150 feet activity did not occur: Safety/medical concerns  Assist level: Minimal Assistance - Patient > 75% Assistive device: Walker-rolling,Orthosis    Walk 10  feet on uneven surface  activity   Assist Walk 10 feet on uneven surfaces activity did not occur: Safety/medical concerns   Assist level: Moderate Assistance - Patient - 50 - 74% Assistive device: Photographer Will patient use wheelchair at discharge?: No Type of Wheelchair: Manual    Wheelchair assist level: Supervision/Verbal cueing Max wheelchair distance: 150    Wheelchair 50 feet  with 2 turns activity    Assist        Assist Level: Supervision/Verbal cueing   Wheelchair 150 feet activity     Assist  Wheelchair 150 feet activity did not occur: Safety/medical concerns   Assist Level: Supervision/Verbal cueing    Medical Problem List and Plan: 1.  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  Antithrombotics: -DVT/anticoagulation:  Pharmaceutical: D/c Lovenox- ambulatung >300 feet             -antiplatelet therapy: N/A 3. Pain Management: Gabapentin tid 4. Mood: Team to provide ego support.              -antipsychotic agents: N/A 5. Neuropsych: This patient is not fully capable of making decisions on his own behalf. 6. Skin/Wound Care: Routine pressure relief measures. 7. Fluids/Electrolytes/Nutrition: Monitor I/Os 8. HTN: Monitor BP tid--continue Norvasc and coreg bid.   Controlled on 3/18, continue above regimen.   Monitor with increased mobility 9. HIV/AIDS: Now on Bitarvy (has been approved for assistance) with Bactrim daily for prophylaxis.  -discussed T cell counts with patient and his father.              --Imodium prn for diarrhea 10. ETOH abuse with folate/thiamine deficiency:  Completed IV doses of folic acid/Thiamine              On folic acid ane thiamine for supplement.   Counsel 11. Hypomagnesemia:              Received 2 gram IV doses--03/04, 03/06 and 03/8.              Now on daily po supplement, changed on 3/9, increased on 3/15  IV magnesium 4 g ordered on 3/10, again on 3/14  Mag 1.5 on 3/18- ordered IV mag 4mg , increase po supplement to TID, provided list of magnesium rich foods, encouraged avoiding alcohol at home.   12. GAD/depression: On Zoloft 100 mg bid with low dose ativan bid prn.  13. B/L foot drops: PRAFOs and AFOs  14 Encephalopathy-resolved. 15.   Hyponatremia  Sodium 135 on 3/15  Continue to monitor 16.  Transaminitis  AST elevated on 3/9  Continue to monitor 17.  Sinus tachycardia  Borderline on 3/15  Continue to monitor 18. Anemia:   Hb 8.3 on 3/14, 8.1 on 3/17  Continue to monitor 19.  Leukopenia-likely related to medications  WBC 3.2 on 3/14, 3.3 on 3/17  LOS: 10 days A FACE TO FACE EVALUATION WAS PERFORMED  4/17 Harold Flores 08/25/2020, 1:38 PM

## 2020-08-25 NOTE — Progress Notes (Signed)
Occupational Therapy Session Note  Patient Details  Name: Harold Flores MRN: [id number] Date of Birth: September 03, 1990  Today's Date: 08/25/2020 OT Individual Time: 1000-1100 OT Individual Time Calculation (min): 60 min    Short Term Goals: Week 2:  OT Short Term Goal 1 (Week 2): patient will complete functional transfers to/from appropriate DME set up/mod I level with RW OT Short Term Goal 2 (Week 2): patient will complete basic HM with RW set up/mod I level OT Short Term Goal 3 (Week 2): patient will complete adl in seated postion set up/mod I level  Skilled Therapeutic Interventions/Progress Updates:    Pt greeted semi-reclined in bed and agreeable to OT treatment session. Pt declined bathing/dressing this am. Pt completed bed mobility with supervision. He was able to achieve figure 4 position to don socks with set-up. Worked on donning B AFOs at [employer] with education provided for technique. Pt able to don AFOs with min A. Sit<>stand at EOB with CGA and RW. CGA to ambulate to the sink, then worked on standing balance and endurance while removing B hands to complete grooming tasks. Pt propelled wc to therapy gym and worked on B hand fine motor control with bead finding activity in soft yellow theraputty. Then worked on standing balance and shoulder mobility with standing ring arch task. Pt returned to room and left seated in wc with needs met awaiting next therapy session.   Therapy Documentation Precautions:  Precautions Precautions: Fall Precaution Comments: falls, B knee buckling Restrictions Weight Bearing Restrictions: No Pain: Pain Assessment Pain Scale: 0-10 Pain Score: 0-No pain   Therapy/Group: Individual Therapy  Valma Cava 08/25/2020, 11:05 AM

## 2020-08-25 NOTE — Progress Notes (Signed)
Recreational Therapy Session Note  Patient Details  Name: Harold Flores MRN: [id number] Date of Birth: 1990/07/05 Today's Date: 08/25/2020  Pain: no c/o Skilled Therapeutic Interventions/Progress Updates: Session focused on community reintegration planning.  Discussion including identifying potential obstacles, problems solving those obstacles and opportunities for energy conservation.  Therapy/Group: Individual Therapy   Aarna Mihalko 08/25/2020, 12:00 PM

## 2020-08-25 NOTE — Progress Notes (Signed)
Physical Therapy Session Note  Patient Details  Name: Harold Flores MRN: [id number] Date of Birth: 04/11/1991  Today's Date: 08/25/2020 PT Individual Time: 1450-1607 PT Individual Time Calculation (min): 77 min   Short Term Goals: Week 2:  PT Short Term Goal 1 (Week 2): Patient will perform transfers consistently with S PT Short Term Goal 2 (Week 2): Patient will negotiate 4 steps with rails and S. PT Short Term Goal 3 (Week 2): Patient will ambulate with LRAD, AFO's and S x 150' PT Short Term Goal 4 (Week 2): Patient will perform floor transfers with S.  Skilled Therapeutic Interventions/Progress Updates:    Patient on EOB donning shoes and AFO's with S.  Performed sit to stand to RW with S.  Ambulated to therapy gym x 200' to stairs with RW and CGA to S cues for pelvis position and .  Demonstrated stair negotiation technique for when wearing bilateral AFO's and educated on foot position.  Patient negotiated 8 steps with rails and AFO's with proper technique with cues and close S to CGA.  Patient worked on standing balance with AFO's and QC static standing with CGA to S, then head turns/nods with 1 LOB posterior CGA to recover, then with weight shifts then with reaching to touch target in front.  Patient ambulated x 23' with QC and AFO's with min to mod A for balance cues for cane progression, step width, step length and only one LOB with mod A recovery.  Patient seated EOM and removed shoe/AFO to trial NMES to L anterior tibialis with positive sensory, but not muscular activation trial with both 35, 50 & 150 Hz and with symmetric and asymmetric waveform.  Patient donned shoe and brace and transferred to w/c with S.  Propelled w/c down hallway, on elevator and out to courtyard outside [employer] [facility].  Patient ambulated over uneven paved surfaces with CGA to min A with facilitation and cues for forward pelvic progression over 200' x 2.  Patient educated in forward movement of  pelvis over ground and less stepping with feet without pelvic progression.  Patient able to demonstrate improvement at times.  Patient propelled in w/c to room with S.  Transfer to bed with S pt performing w/c set up appropriately.  Left in bed with RN in the room to hook up IV.    Therapy Documentation Precautions:  Precautions Precautions: Fall Precaution Comments: falls, B knee buckling Restrictions Weight Bearing Restrictions: No Pain: Pain Assessment Pain Score: 0-No pain   Therapy/Group: Individual Therapy  Elray Mcgregor  Sheran Lawless, PT 08/25/2020, 4:49 PM

## 2020-08-25 NOTE — Consult Note (Signed)
Neuropsychological Consultation   Patient:   Harold Flores   DOB:   1991-05-22  MR Number:  [id number]  Location:  [facility] [address] CENTER A [address] [id number] [place] [address] Dept: [phone] Loc: [phone]           Date of Service:   08/25/2020  Start Time:   8 AM End Time:   9 AM  Provider/Observer:  Arley Phenix, Psy.D.       Clinical Neuropsychologist       Billing Code/Service: [id number]  Chief Complaint:    Harold Flores is a 30 year old male with history of alcohol abuse, syphilis, HIV/AIDS with noncompliance with medications, BLE edema.  Patient was admitted on 08/06/2020 with bilateral lower extremity weakness with difficulty walking, paresthesia, decreased appetite and loose stool, worsening of confusion with hallucinations.  Patient found to have significant hyperkalemia and elevated troponin felt to be multifactorial.  There was concern about opportunistic infection.  MRI brain and spine done was negative for acute abnormality.  Peripheral CMV positive inpatient with issues concerning for Harold Flores.  Patient was treated with IVIG due to suspicion of GBS, high doses of IV B1 and folic acid due to concerns of folic acid/thiamine deficiency causing encephalopathy/confusion.  Patient has been referred to the comprehensive inpatient rehabilitation program due to significant issues with bilateral lower extremity instability, ataxic gait with tendency to lose balance as well as waxing and waning mentation.  Patient was referred to CIR due to functional decline.  Reason for Service:  Patient was referred for neuropsychological consultation due to coping and adjustment issues and history of noncompliance with medications and medical care.  Patient is HIV positive and in full-blown AIDS status.  Patient with acute issues associated with Harold Flores.  Below see HPI for the current  admission.  HPI:  Harold Flores is a 30 year old male with history of ETOH abuse, syphilis, HIV/AIDS (did not follow up with ID due to GAD), BLE edema (started on lasix 2/10 as refused to go to ED) who was admitted on 08/06/20 with BLE weakness with difficulty walking, paraesthesia, decreased appetite with loose stools, worsening of confusion with hallucinations. He was found to have significant hypokalemia with K-2.0 and elevated troponin felt to be multifactorial due to LVH, tachycardia and anemia. Dr. Iver Nestle consulted for input and expressed concerns of opportunistic infection and full work up initiated. MRI brain and spine done and was negative for acute abnormality or abnormal cord signal.  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  Folate low -3.4 and Thiamine low-31.9.   He was treated with IVIG due to suspicion of GBS, high doses of IV B1 and folic acid due to concerns of folic acid/thiamine deficiency causing encephalopathy/confusion as well as IV ganciclovir for empiric CMV polyradiculopathy. 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  LP negative for JC virus, EGV, VSZ, VDRL, toxoplasma and CMV--mycobacterium TB  pending.  He has had some improvement in BLE strength but continues to have BLE instability, ataxic gait with tendency to loose balance posteriorly as well as waxing and waning of mentation. CIR recommended due to functional decline.    Current Status:  The patient was awake and alert and well oriented when I entered the room.  There have been significant improvements in his overall cognitive functioning  with good mental status and mentation.  Patient's mood was appropriate to situation and patient denied significant depression or anxiety symptoms currently.  Patient reports that he understands what was going on.  Patient describes some of the  factors that led to him being Noncompliant with medications and attributed most of it to "drinking and smoking marijuana" and simply avoiding dealing with overall aspects of his medical status.  Patient continuing with significant motor deficits but has been able to walk with significant gait disturbance although some of the sensorimotor changes in his lower extremities have been improving significantly with therapies.  Patient reports that he has made commitments to stop his substance abuse going forward and realizes that if not directly causing his difficulties it has indirectly exacerbated significant medical issues including noncompliance.  Behavioral Observation: Harold Flores  presents as a 30 y.o.-year-old Right African American Male who appeared his stated age. his dress was Appropriate and he was Well Groomed and his manners were Appropriate to the situation.  his participation was indicative of Appropriate and Attentive behaviors.  There were physical disabilities noted.  he displayed an appropriate level of cooperation and motivation.     Interactions:    Active Appropriate  Attention:   within normal limits and attention span and concentration were age appropriate  Memory:   within normal limits; recent and remote memory intact  Visuo-spatial:  not examined  Speech (Volume):  low  Speech:   normal; normal  Thought Process:  Coherent and Relevant  Though Content:  WNL; not suicidal and not homicidal  Orientation:   person, place, time/date and situation  Judgment:   Fair  Planning:   Fair  Affect:    Appropriate  Mood:    Anxious  Insight:   Fair  Intelligence:   normal  Substance Use:  There is a documented history of alcohol and marijuana abuse confirmed by the patient.     Medical History:   Past Medical History:  Diagnosis Date  . Anal fissure   . Anal fistula   . Anal ulcer   . Asthma   . Epistaxis   . GAD (generalized anxiety disorder)   . GERD  (gastroesophageal reflux disease)   . Hemorrhoid   . HIV (human immunodeficiency virus infection) (HCC)   . Hypertension   . Syphilis   . Varicella          Patient Active Problem List   Diagnosis Date Noted  . GBS (Guillain Barre Flores) (HCC)   . Leukopenia   . Anemia   . AIDS (HCC)   . Sinus tachycardia   . Essential hypertension   . Transaminitis   . ETOH abuse   . Progressive focal motor weakness 08/15/2020  . Acute encephalopathy   . AMS (altered mental status)   . Encephalopathy   . Alcohol abuse 08/09/2020  . Normocytic anemia 08/09/2020  . Diarrhea 08/09/2020  . Hypomagnesemia 08/09/2020  . Hyponatremia 08/09/2020  . Folic acid deficiency 08/09/2020  . Lower extremity weakness 08/08/2020  . Acute metabolic encephalopathy 08/07/2020  . Hypokalemia 08/03/2020  . Paresthesia of both lower extremities 07/23/2020  . Peripheral edema 07/23/2020  . Abnormal ECG 07/23/2020  . Gait disorder 07/20/2020  . Smoker 09/08/2018  . Medication monitoring encounter 09/08/2018  . Vitamin D deficiency disease 04/29/2018  . Screening examination for venereal disease 06/18/2016  . HIV (human immunodeficiency virus infection) (HCC) 02/22/2015  . Genital herpes in men 01/04/2015  . Gastroesophageal reflux disease without esophagitis  01/04/2015  . Tachycardia 12/21/2014  . Syphili, latent 10/09/2012  . Routine general medical examination at a health care facility 10/08/2012  . Essential hypertension, benign 10/08/2012  . GAD (generalized anxiety disorder) 03/12/2012  . Mild persistent asthma 05/06/2007   Psychiatric History:  Patient has a history of generalized anxiety disorder and reports it is has played a role initially with this diagnosis his GAD was exacerbated with medical issues but that he is coping better currently as he sees functional improvement.  Family Med/Psych History:  Family History  Problem Relation Age of Onset  . Diabetes Mother   . Hyperlipidemia  Mother   . Diabetes Maternal Grandmother   . Heart disease Paternal Grandfather   . Cancer Neg Hx     Impression/DX:  AYCE PIETRZYK is a 30 year old male with history of alcohol abuse, syphilis, HIV/AIDS with noncompliance with medications, BLE edema.  Patient was admitted on 08/06/2020 with bilateral lower extremity weakness with difficulty walking, paresthesia, decreased appetite and loose stool, worsening of confusion with hallucinations.  Patient found to have significant hyperkalemia and elevated troponin felt to be multifactorial.  There was concern about opportunistic infection.  MRI brain and spine done was negative for acute abnormality.  Peripheral CMV positive inpatient with issues concerning for Harold Flores.  Patient was treated with IVIG due to suspicion of GBS, high doses of IV B1 and folic acid due to concerns of folic acid/thiamine deficiency causing encephalopathy/confusion.  Patient has been referred to the comprehensive inpatient rehabilitation program due to significant issues with bilateral lower extremity instability, ataxic gait with tendency to lose balance as well as waxing and waning mentation.  Patient was referred to CIR due to functional decline.  The patient was awake and alert and well oriented when I entered the room.  There have been significant improvements in his overall cognitive functioning with good mental status and mentation.  Patient's mood was appropriate to situation and patient denied significant depression or anxiety symptoms currently.  Patient reports that he understands what was going on.  Patient describes some of the factors that led to him being Noncompliant with medications and attributed most of it to "drinking and smoking marijuana" and simply avoiding dealing with overall aspects of his medical status.  Patient continuing with significant motor deficits but has been able to walk with significant gait disturbance although some of the  sensorimotor changes in his lower extremities have been improving significantly with therapies.  Patient reports that he has made commitments to stop his substance abuse going forward and realizes that if not directly causing his difficulties it has indirectly exacerbated significant medical issues including noncompliance.  Disposition/Plan:  Today to work on coping and adjustment issues and addressed issues related to his substance use/abuse and medical noncompliance.  The patient was open and engaged during these interactions and we were able to review numerous topics and address some in depth.  Patient is planned to be discharged soon.  The patient's anxiety and depression appears to be well managed at this point  Diagnosis:    Harold Flores and current full-blown HIV the patient restarting antiretroviral medications.         Electronically Signed   _______________________ Arley Phenix, Psy.D. Clinical Neuropsychologist

## 2020-08-26 NOTE — Progress Notes (Signed)
[facility] PHYSICAL MEDICINE & REHABILITATION PROGRESS NOTE  Subjective/Complaints: Up at EOB putting on AFO's and shoes. Happy that IV is out.  No new complaints  ROS: Patient denies fever, rash, sore throat, blurred vision, nausea, vomiting, diarrhea, cough, shortness of breath or chest pain, joint or back pain, headache, or mood change.    Objective: Vital Signs: Blood pressure 124/79, pulse 90, temperature 97.9 F (36.6 C), resp. rate 18, height 5\' 10"  (1.778 m), weight 77.9 kg, SpO2 100 %. No results found. Recent Labs    08/24/20 0529  WBC 3.3*  HGB 8.1*  HCT 23.9*  PLT 429*   No results for input(s): NA, K, CL, CO2, GLUCOSE, BUN, CREATININE, CALCIUM in the last 72 hours.  Intake/Output Summary (Last 24 hours) at 08/26/2020 1455 Last data filed at 08/26/2020 0811 Gross per 24 hour  Intake 540 ml  Output 1250 ml  Net -710 ml        Physical Exam: BP 124/79 (BP Location: Left Arm)   Pulse 90   Temp 97.9 F (36.6 C)   Resp 18   Ht 5\' 10"  (1.778 m)   Wt 77.9 kg   SpO2 100%   BMI 24.64 kg/m   General: No acute distress HEENT: EOMI, oral membranes moist Cards: reg rate  Chest: normal effort Abdomen: Soft, NT, ND Skin: dry, intact Extremities: no edema Psych: pleasant and appropriate Neuro: Alert Motor:  Bilateral UE: 4+/5 proximal to distal Bilateral lower extremities: Hip flexion, knee extension 4/5, ankle dorsiflexion 2-/5 stable in appearance Sensation diminished to light touch bilateral feet  Assessment/Plan: 1. Functional deficits which require 3+ hours per day of interdisciplinary therapy in a comprehensive inpatient rehab setting.  Physiatrist is providing close team supervision and 24 hour management of active medical problems listed below.  Physiatrist and rehab team continue to assess barriers to discharge/monitor patient progress toward functional and medical goals   Care Tool:  Bathing    Body parts bathed by patient: Right arm,Left  arm,Front perineal area,Chest,Abdomen,Right upper leg,Left upper leg,Right lower leg,Left lower leg,Face,Buttocks   Body parts bathed by helper: Buttocks     Bathing assist Assist Level: Supervision/Verbal cueing     Upper Body Dressing/Undressing Upper body dressing   What is the patient wearing?: Pull over shirt    Upper body assist Assist Level: Set up assist    Lower Body Dressing/Undressing Lower body dressing      What is the patient wearing?: Underwear/pull up,Pants     Lower body assist Assist for lower body dressing: Contact Guard/Touching assist     Toileting Toileting    Toileting assist Assist for toileting: Contact Guard/Touching assist     Transfers Chair/bed transfer  Transfers assist     Chair/bed transfer assist level: Supervision/Verbal cueing     Locomotion Ambulation   Ambulation assist      Assist level: Supervision/Verbal cueing Assistive device: Walker-rolling (B AFO) Max distance: 400'+   Walk 10 feet activity   Assist     Assist level: Contact Guard/Touching assist Assistive device: Walker-rolling,Orthosis   Walk 50 feet activity   Assist    Assist level: Contact Guard/Touching assist Assistive device: Walker-rolling,Orthosis    Walk 150 feet activity   Assist Walk 150 feet activity did not occur: Safety/medical concerns  Assist level: Contact Guard/Touching assist Assistive device: Walker-rolling,Orthosis    Walk 10 feet on uneven surface  activity   Assist Walk 10 feet on uneven surfaces activity did not occur: Safety/medical concerns  Assist level: Minimal Assistance - Patient > 75% Assistive device: Walker-rolling,Orthosis   Wheelchair     Assist Will patient use wheelchair at discharge?: No Type of Wheelchair: Manual    Wheelchair assist level: Supervision/Verbal cueing Max wheelchair distance: 200'    Wheelchair 50 feet with 2 turns activity    Assist        Assist Level:  Supervision/Verbal cueing   Wheelchair 150 feet activity     Assist  Wheelchair 150 feet activity did not occur: Safety/medical concerns   Assist Level: Supervision/Verbal cueing   BP 124/79 (BP Location: Left Arm)   Pulse 90   Temp 97.9 F (36.6 C)   Resp 18   Ht 5\' 10"  (1.778 m)   Wt 77.9 kg   SpO2 100%   BMI 24.64 kg/m   Medical Problem List and Plan: 1.  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  Antithrombotics: -DVT/anticoagulation:  Pharmaceutical: D/c Lovenox- ambulatung >300 feet             -antiplatelet therapy: N/A 3. Pain Management: Gabapentin tid 4. Mood: Team to provide ego support.              -antipsychotic agents: N/A 5. Neuropsych: This patient is not fully capable of making decisions on his own behalf. 6. Skin/Wound Care: Routine pressure relief measures. 7. Fluids/Electrolytes/Nutrition: Monitor I/Os 8. HTN: Monitor BP tid--continue Norvasc and coreg bid.   Controlled on 3/19, continue above regimen.   Monitor with increased mobility 9. HIV/AIDS: Now on Bitarvy (has been approved for assistance) with Bactrim daily for prophylaxis.  -discussed T cell counts with patient and his father.              --Imodium prn for diarrhea 10. ETOH abuse with folate/thiamine deficiency:  Completed IV doses of folic acid/Thiamine              On folic acid ane thiamine for supplement.   Counsel 11. Hypomagnesemia:              Received 2 gram IV doses--03/04, 03/06 and 03/8.              Now on daily po supplement, changed on 3/9, increased on 3/15  IV magnesium 4 g ordered on 3/10, again on 3/14  Mag 1.5 on 3/18- ordered IV mag 4mg , increase po supplement to TID, provided list of magnesium rich foods, encouraged avoiding alcohol at home.    -recheck Mg++ on Monday 12.  GAD/depression: On Zoloft 100 mg bid with low dose ativan bid prn.  13. B/L foot drops: PRAFOs and AFOs  14 Encephalopathy-resolved. 15.  Hyponatremia  Sodium 135 on 3/15  Continue to monitor 16.  Transaminitis  AST elevated on 3/9  Continue to monitor 17.  Sinus tachycardia  HR now controlled 18. Anemia:   Hb 8.3 on 3/14, 8.1 on 3/17  Continue to monitor 19.  Leukopenia-likely related to medications  WBC 3.2 on 3/14, 3.3 on 3/17  LOS: 11 days A FACE TO FACE EVALUATION WAS PERFORMED  4/14 08/26/2020, 2:55 PM

## 2020-08-26 NOTE — Progress Notes (Signed)
Physical Therapy Session Note  Patient Details  Name: Harold Flores MRN: [id number] Date of Birth: 1991/05/14  Today's Date: 08/26/2020 PT Individual Time: 0805-0850 PT Individual Time Calculation (min): 45 min   Short Term Goals: Week 2:  PT Short Term Goal 1 (Week 2): Patient will perform transfers consistently with S PT Short Term Goal 2 (Week 2): Patient will negotiate 4 steps with rails and S. PT Short Term Goal 3 (Week 2): Patient will ambulate with LRAD, AFO's and S x 150' PT Short Term Goal 4 (Week 2): Patient will perform floor transfers with S.  Skilled Therapeutic Interventions/Progress Updates:   Pt received sitting in WC and agreeable to PT. Gait training through hall with RW and Bil AFO 180f, +1486f+12039fmin assist-supervision assist throughout with cues for improved anterior pelvic and tibial translation. pregait stepping over hockey stick 2 x 10 cues for improved step length and weight shift R and L.   Dynamic gait training in parallel bars side stepping r and L 2 x 3 Bil and forward/reverse 2 x 3 each direction with min-supervision assist from PT with cues for adequate step length and height as well as improved anterior pelvic translation.   nustep reciprocal endurance and activity tolerance training level 7 x 5 minutes with therapeutic rest break upon completion due with mild BLE fatigue.   Patient returned to room and left sitting in EOB with call bell in reach and all needs met.             Therapy Documentation Precautions:  Precautions Precautions: Fall Precaution Comments: falls, B knee buckling Restrictions Weight Bearing Restrictions: No    Pain:   denies   Therapy/Group: Individual Therapy  AusLorie Phenix19/2022, 9:56 AM

## 2020-08-26 NOTE — Progress Notes (Signed)
Occupational Therapy Session Note  Patient Details  Name: Harold Flores MRN: [id number] Date of Birth: 1990/07/27  Today's Date: 08/26/2020 OT Individual Time: 4098-1191 OT Individual Time Calculation (min): 41 min and 40 min   Short Term Goals: Week 2:  OT Short Term Goal 1 (Week 2): patient will complete functional transfers to/from appropriate DME set up/mod I level with RW OT Short Term Goal 2 (Week 2): patient will complete basic HM with RW set up/mod I level OT Short Term Goal 3 (Week 2): patient will complete adl in seated postion set up/mod I level  Skilled Therapeutic Interventions/Progress Updates:    Session 1 ([phone]): Pt received semi-reclined in bed, agreeable to therapy. Denies pain but c/o of B hand "coldness/numbness." Donned B shoes/AFOs with close S seated EOB. Amb to and from ortho gym with RW and close S. To target dynamic standing balance in prep for improved functional mobility and ADL/IADL performance, stood to the side to complete 2 games on BITS. Pt able to complete 3 min facing the right and then 3 min facing the L to complete single target game with overall CGA and intermittent use of BUE support on RW. Noted BLE shaking towards end of activity. Finally, in prep for improved activity tolerance/functinoal strength, complete 1x10 of modified sit ups and overhead reaches with 2 kg ball with good tolerance. From elevated surface, completed 3 STS without use of RW progressing from mod A and mod VCs for technique to light min A to power up. Amb transfer back to bed and doffed B shoes/AFO close S.  Pt left semi-reclined in bed with bed alarm engaged, call bell in reach, and all immediate needs met.    Session 2 [phone]): Pt received seated EOB, agreeable to therapy. Session focus on dynamic standing balance, STS, and activity tolerance in prep for community navigation. Donned/doffed B shoes/AFO with close S. Donned shirt with set-up. STS and amb to and from hospital  lobby with close S and RW. Practiced weaving in and out of tables/chairs and locating specified items in gift shop with overall close S. Additionally, practiced completing STS from various height chairs/seating in community environment and RW management. Able to complete STS with RW with close S, but cont to req mod A to power up from low surface without use of RW. Pt reports minimal R ankle pain after ambulation, resolves with rest. Amb transfer back to bed close S. Sitting EOB > supine mod I.   Pt left semi-reclined in bed with bed alarm engaged, call bell in reach, and all immediate needs met.    Therapy Documentation Precautions:  Precautions Precautions: Fall Precaution Comments: falls, B knee buckling Restrictions Weight Bearing Restrictions: No Pain:  See session notes ADL: See Care Tool for more details.   Therapy/Group: Individual Therapy  Volanda Napoleon MS, OTR/L  08/26/2020, 6:45 AM

## 2020-08-28 LAB — BASIC METABOLIC PANEL
Anion gap: 8 (ref 5–15)
BUN: 6 mg/dL (ref 6–20)
CO2: 25 mmol/L (ref 22–32)
Calcium: 9.3 mg/dL (ref 8.9–10.3)
Chloride: 104 mmol/L (ref 98–111)
Creatinine, Ser: 0.62 mg/dL (ref 0.61–1.24)
GFR, Estimated: >60 mL/min (ref >60)
Glucose, Bld: 88 mg/dL (ref 70–99)
Potassium: 3.6 mmol/L (ref 3.5–5.1)
Sodium: 137 mmol/L (ref 135–145)

## 2020-08-28 LAB — MAGNESIUM: Magnesium: 1.5 mg/dL — ABNORMAL LOW (ref 1.7–2.4)

## 2020-08-28 NOTE — Progress Notes (Signed)
Pt reports that may need assistance with medications when discharging.

## 2020-08-28 NOTE — Progress Notes (Signed)
Physical Therapy Session Note  Patient Details  Name: Harold Flores MRN: [id number] Date of Birth: May 18, 1991  Today's Date: 08/28/2020 PT Individual Time:Session1: 6387-5643; Session2:1420-1545 PT Individual Time Calculation (min): 45 min & 85 min  Short Term Goals: Week 2:  PT Short Term Goal 1 (Week 2): Patient will perform transfers consistently with S PT Short Term Goal 2 (Week 2): Patient will negotiate 4 steps with rails and S. PT Short Term Goal 3 (Week 2): Patient will ambulate with LRAD, AFO's and S x 150' PT Short Term Goal 4 (Week 2): Patient will perform floor transfers with S.  Skilled Therapeutic Interventions/Progress Updates:    Session1:  Patient seated EOB donning shoes/braces independently.  Sit to stand with S to RW.  Patient ambulated with RW and S to therapy gym with knees locking in extension and limited forward progression of pelvis over BOS.  Patient negotiated 4 steps with bilateral rails with S and cues for foot placement for descent.  Then negotiated 4 steps with 1 rail and min to mod A for power up step and for safety with descent.  Patient standing at rails on steps with UE support step taps to 1st step, then in parallel bars performed taps to BOSU forward alternating with 2 then 1 UE support.  Performed forward lunges onto BOS with bilateral UE support.  Performed side taps with stance knee flexed with both, then 1 UE support to R then to L.  Performed back taps to BOSU alternating with stance knee flexed and bilateral UE support.  Forward ambulation in bars with both knees flexed throughout stance with bilateral vs 1 UE support. Patient ambulated with QC and min A with cues for sequence x 50'.  Ambulated with RW to room knees flexed and S to CGA.  :Left seated EOB with bed alarm on and needs in reach.  Session2:Patient seated EOB with braces already donned.  Sit to stand and ambulation to gym with RW and S. Patient ambulated with QC 2x 39' with min to CGA,  facilitation for L hip progression x 1 and pt keeping knees flexed throughout.  Removed AFO's and shoes to attempt to elicit ankle DF with NMES, but unable to increase response trying 35-65 Hz and symmetric and asymmetric wave forms.  Patient ambulated to ortho gym for ramp training with AFO's, performing with RW and cues with CGA x 2.  Negotiated mulch with RW then with QC and CGA cues for picking up walker and for safety with descending curb step out of much with QC and rail.  Patient ambulated to general gym 120' with RW and S.  In parallel bars ambulated without device with CGA x 4 reps, then outside of bars to mat x 30' with CGA and incresed arm swing.  Patient performed Berg balance assessment as noted below.   Therapy Documentation Precautions:  Precautions Precautions: Fall Precaution Comments: falls, B knee buckling Restrictions Weight Bearing Restrictions: No Pain: Pain Assessment Faces Pain Scale: Hurts a little bit Pain Type: Neuropathic pain Pain Location: Foot Pain Orientation: Right;Left Pain Descriptors / Indicators: Tingling Pain Onset: On-going Balance: Standardized Balance Assessment Standardized Balance Assessment: Berg Balance Test Berg Balance Test Sit to Stand: Able to stand  independently using hands Standing Unsupported: Able to stand 2 minutes with supervision Sitting with Back Unsupported but Feet Supported on Floor or Stool: Able to sit safely and securely 2 minutes Stand to Sit: Controls descent by using hands Transfers: Able to transfer with verbal cueing  and /or supervision Standing Unsupported with Eyes Closed: Needs help to keep from falling Standing Ubsupported with Feet Together: Needs help to attain position but able to stand for 30 seconds with feet together From Standing, Reach Forward with Outstretched Arm: Reaches forward but needs supervision From Standing Position, Pick up Object from Floor: Unable to try/needs assist to keep balance From  Standing Position, Turn to Look Behind Over each Shoulder: Needs supervision when turning Turn 360 Degrees: Needs assistance while turning Standing Unsupported, Alternately Place Feet on Step/Stool: Able to complete >2 steps/needs minimal assist Standing Unsupported, One Foot in Front: Needs help to step but can hold 15 seconds Standing on One Leg: Tries to lift leg/unable to hold 3 seconds but remains standing independently Total Score: 21   Therapy/Group: Individual Therapy  Elray Mcgregor  Sheran Lawless, PT 08/28/2020, 12:33 PM

## 2020-08-28 NOTE — Progress Notes (Signed)
Occupational Therapy Session Note  Patient Details  Name: Harold Flores MRN: [id number] Date of Birth: [birth year]  Today's Date: 08/28/2020 OT Individual Time: 1000-1058 OT Individual Time Calculation (min): 58 min    Short Term Goals: Week 2:  OT Short Term Goal 1 (Week 2): patient will complete functional transfers to/from appropriate DME set up/mod I level with RW OT Short Term Goal 2 (Week 2): patient will complete basic HM with RW set up/mod I level OT Short Term Goal 3 (Week 2): patient will complete adl in seated postion set up/mod I level  Skilled Therapeutic Interventions/Progress Updates:    Patient seated edge of bed, ready for therapy session.  He denies pain and states that he wants to work on strength and balance.  Confirmed plan for DME at home - he agreed to proceed with ordering commode and states that his grandmother has a shower seat for him to use.   Able to donn bilateral AFO and sneakers with set up.  Sit to stand and ambulation with RW to/from therapy gym with CS.   Completed unsupported sitting, standing, tall kneel, prone and supine NMRE with focus on core strength, coordination, balance, transitional movement, and general conditioning.   He is able to complete plank position with CGA and requires CGA for tall kneel and balance challenges in stance.   Reviewed comprehensive HEP with good understanding demonstrated.  He returned to sitting edge of bed at close of session, bed alarm set and call bell in reach.    Therapy Documentation Precautions:  Precautions Precautions: Fall Precaution Comments: falls, B knee buckling Restrictions Weight Bearing Restrictions: No  Therapy/Group: Individual Therapy  [name] 08/28/2020, 7:35 AM

## 2020-08-28 NOTE — Progress Notes (Signed)
[facility] PHYSICAL MEDICINE & REHABILITATION PROGRESS NOTE  Subjective/Complaints: Patient seen sitting up in bed this morning.  He states he slept well overnight.  He states he had a good weekend.  He states he is ready to resume therapies.  ROS: Denies CP, SOB, N/V/D  Objective: Vital Signs: Blood pressure 123/74, pulse 86, temperature 98.5 F (36.9 C), resp. rate 18, height 5\' 10"  (1.778 m), weight 77.9 kg, SpO2 100 %. No results found. No results for input(s): WBC, HGB, HCT, PLT in the last 72 hours. Recent Labs    08/28/20 0629  NA 137  K 3.6  CL 104  CO2 25  GLUCOSE 88  BUN 6  CREATININE 0.62  CALCIUM 9.3    Intake/Output Summary (Last 24 hours) at 08/28/2020 1038 Last data filed at 08/28/2020 08/30/2020 Gross per 24 hour  Intake 840 ml  Output 1350 ml  Net -510 ml        Physical Exam: BP 123/74 (BP Location: Right Arm)   Pulse 86   Temp 98.5 F (36.9 C)   Resp 18   Ht 5\' 10"  (1.778 m)   Wt 77.9 kg   SpO2 100%   BMI 24.64 kg/m   Constitutional: No distress . Vital signs reviewed. HENT: Normocephalic.  Atraumatic. Eyes: EOMI. No discharge. Cardiovascular: No JVD.  RRR. Respiratory: Normal effort.  No stridor.  Bilateral clear to auscultation. GI: Non-distended.  BS +. Skin: Warm and dry.  Intact. Psych: Normal mood.  Normal behavior. Musc: No edema in extremities.  No tenderness in extremities. Neuro: Alert Motor:  Bilateral UE: 4+/5 proximal to distal Bilateral lower extremities: Hip flexion, knee extension 4/5, ankle dorsiflexion 2-/5, unchanged Sensation diminished to light touch bilateral feet  Assessment/Plan: 1. Functional deficits which require 3+ hours per day of interdisciplinary therapy in a comprehensive inpatient rehab setting.  Physiatrist is providing close team supervision and 24 hour management of active medical problems listed below.  Physiatrist and rehab team continue to assess barriers to discharge/monitor patient progress toward  functional and medical goals   Care Tool:  Bathing    Body parts bathed by patient: Right arm,Left arm,Front perineal area,Chest,Abdomen,Right upper leg,Left upper leg,Right lower leg,Left lower leg,Face,Buttocks   Body parts bathed by helper: Buttocks     Bathing assist Assist Level: Supervision/Verbal cueing     Upper Body Dressing/Undressing Upper body dressing   What is the patient wearing?: Pull over shirt    Upper body assist Assist Level: Set up assist    Lower Body Dressing/Undressing Lower body dressing      What is the patient wearing?: Underwear/pull up,Pants     Lower body assist Assist for lower body dressing: Contact Guard/Touching assist     Toileting Toileting    Toileting assist Assist for toileting: Contact Guard/Touching assist     Transfers Chair/bed transfer  Transfers assist     Chair/bed transfer assist level: Supervision/Verbal cueing     Locomotion Ambulation   Ambulation assist      Assist level: Supervision/Verbal cueing Assistive device: Walker-rolling (B AFO) Max distance: 400'+   Walk 10 feet activity   Assist     Assist level: Contact Guard/Touching assist Assistive device: Walker-rolling,Orthosis   Walk 50 feet activity   Assist    Assist level: Contact Guard/Touching assist Assistive device: Walker-rolling,Orthosis    Walk 150 feet activity   Assist Walk 150 feet activity did not occur: Safety/medical concerns  Assist level: Contact Guard/Touching assist Assistive device: Walker-rolling,Orthosis  Walk 10 feet on uneven surface  activity   Assist Walk 10 feet on uneven surfaces activity did not occur: Safety/medical concerns   Assist level: Minimal Assistance - Patient > 75% Assistive device: Walker-rolling,Orthosis   Wheelchair     Assist Will patient use wheelchair at discharge?: No Type of Wheelchair: Manual    Wheelchair assist level: Supervision/Verbal cueing Max wheelchair  distance: 200'    Wheelchair 50 feet with 2 turns activity    Assist        Assist Level: Supervision/Verbal cueing   Wheelchair 150 feet activity     Assist  Wheelchair 150 feet activity did not occur: Safety/medical concerns   Assist Level: Supervision/Verbal cueing   BP 123/74 (BP Location: Right Arm)   Pulse 86   Temp 98.5 F (36.9 C)   Resp 18   Ht 5\' 10"  (1.778 m)   Wt 77.9 kg   SpO2 100%   BMI 24.64 kg/m   Medical Problem List and Plan: 1.  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  Antithrombotics: -DVT/anticoagulation:  Pharmaceutical: D/ced Lovenox given ambulation             -antiplatelet therapy: N/A 3. Pain Management: Gabapentin tid 4. Mood: Team to provide ego support.              -antipsychotic agents: N/A 5. Neuropsych: This patient is not fully capable of making decisions on his own behalf. 6. Skin/Wound Care: Routine pressure relief measures. 7. Fluids/Electrolytes/Nutrition: Monitor I/Os 8. HTN: Monitor BP tid--continue Norvasc and coreg bid.   Controlled on 3/21  Monitor with increased mobility 9. HIV/AIDS: Now on Bitarvy (has been approved for assistance) with Bactrim daily for prophylaxis.  -discussed T cell counts with patient and his father.              --Imodium prn for diarrhea 10. ETOH abuse with folate/thiamine deficiency:  Completed IV doses of folic acid/Thiamine              On folic acid ane thiamine for supplement.   Counsel 11. Hypomagnesemia:              Received 2 gram IV doses--03/04, 03/06 and 03/8, 4 g on 3/10, 3/14.              Now on daily po supplement, changed on 3/9, increased on 3/15  Increased po supplement to TID, provided list of magnesium rich foods, avoid alcohol at home.    Magnesium 1.5 on 3/21  PPI DC'd on 3/21 12. GAD/depression: On Zoloft 100 mg bid with low  dose ativan bid prn.  13. B/L foot drops: PRAFOs and AFOs  14 Encephalopathy-resolved. 15.  Hyponatremia  Sodium 137 on 3/21  Continue to monitor 16.  Transaminitis  AST elevated on 3/9  Continue to monitor 17.  Sinus tachycardia  Controlled on 3/21 18. Anemia:   Hb 8.1 on 3/17  Continue to monitor 19.  Leukopenia-likely related to medications  WBC 3.3 on 3/17  LOS: 13 days A FACE TO FACE EVALUATION WAS PERFORMED  Ankit 4/17 08/28/2020, 10:38 AM

## 2020-08-28 NOTE — Plan of Care (Signed)
  Problem: RH BOWEL ELIMINATION Goal: RH STG MANAGE BOWEL WITH ASSISTANCE Description: STG Manage Bowel with Mod I Assistance Outcome: Progressing   Problem: RH SKIN INTEGRITY Goal: RH STG MAINTAIN SKIN INTEGRITY WITH ASSISTANCE Description: STG Maintain Skin Integrity With Mod I Assistance. Outcome: Progressing   Problem: RH PAIN MANAGEMENT Goal: RH STG PAIN MANAGED AT OR BELOW PT'S PAIN GOAL Description: Patient will maintain pain level less than 4 on 0-10 scale Outcome: Progressing   Problem: RH KNOWLEDGE DEFICIT GENERAL Goal: RH STG INCREASE KNOWLEDGE OF SELF CARE AFTER HOSPITALIZATION Description: Patient will be able to demonstrate understanding of home medication regimen independently.   Outcome: Progressing   Problem: Consults Goal: RH GENERAL PATIENT EDUCATION Description: See Patient Education module for education specifics. Outcome: Progressing   

## 2020-08-29 LAB — CULTURE, FUNGUS WITHOUT SMEAR

## 2020-08-29 NOTE — Progress Notes (Signed)
[facility] PHYSICAL MEDICINE & REHABILITATION PROGRESS NOTE  Subjective/Complaints: Patient seen sitting up at the edge of his bed this morning working with therapies.  He states he slept well overnight.  He is questions regarding medications and affordability of discharge.  ROS: Denies CP, SOB, N/V/D  Objective: Vital Signs: Blood pressure 122/76, pulse 85, temperature 99.7 F (37.6 C), temperature source Oral, resp. rate 17, height 5\' 10"  (1.778 m), weight 77.9 kg, SpO2 100 %. No results found. No results for input(s): WBC, HGB, HCT, PLT in the last 72 hours. Recent Labs    08/28/20 0629  NA 137  K 3.6  CL 104  CO2 25  GLUCOSE 88  BUN 6  CREATININE 0.62  CALCIUM 9.3    Intake/Output Summary (Last 24 hours) at 08/29/2020 1057 Last data filed at 08/29/2020 0700 Gross per 24 hour  Intake 840 ml  Output 850 ml  Net -10 ml        Physical Exam: BP 122/76 (BP Location: Right Arm)   Pulse 85   Temp 99.7 F (37.6 C) (Oral)   Resp 17   Ht 5\' 10"  (1.778 m)   Wt 77.9 kg   SpO2 100%   BMI 24.64 kg/m   Constitutional: No distress . Vital signs reviewed. HENT: Normocephalic.  Atraumatic. Eyes: EOMI. No discharge. Cardiovascular: No JVD.  RRR. Respiratory: Normal effort.  No stridor.  Bilateral clear to auscultation. GI: Non-distended.  BS +. Skin: Warm and dry.  Intact. Psych: Normal mood.  Normal behavior. Musc: No edema in extremities.  No tenderness in extremities. Neuro: Alert Motor:  Bilateral UE: 4+/5 proximal to distal Bilateral lower extremities: Hip flexion, knee extension 4/5, ankle dorsiflexion 2-/5, stable Sensation diminished to light touch bilateral feet  Assessment/Plan: 1. Functional deficits which require 3+ hours per day of interdisciplinary therapy in a comprehensive inpatient rehab setting.  Physiatrist is providing close team supervision and 24 hour management of active medical problems listed below.  Physiatrist and rehab team continue to  assess barriers to discharge/monitor patient progress toward functional and medical goals   Care Tool:  Bathing    Body parts bathed by patient: Right arm,Left arm,Front perineal area,Chest,Abdomen,Right upper leg,Left upper leg,Right lower leg,Left lower leg,Face,Buttocks   Body parts bathed by helper: Buttocks     Bathing assist Assist Level: Supervision/Verbal cueing     Upper Body Dressing/Undressing Upper body dressing   What is the patient wearing?: Pull over shirt    Upper body assist Assist Level: Set up assist    Lower Body Dressing/Undressing Lower body dressing      What is the patient wearing?: Underwear/pull up,Pants     Lower body assist Assist for lower body dressing: Contact Guard/Touching assist     Toileting Toileting    Toileting assist Assist for toileting: Contact Guard/Touching assist     Transfers Chair/bed transfer  Transfers assist     Chair/bed transfer assist level: Supervision/Verbal cueing     Locomotion Ambulation   Ambulation assist      Assist level: Supervision/Verbal cueing Assistive device: Walker-rolling (B AFO) Max distance: 400'+   Walk 10 feet activity   Assist     Assist level: Contact Guard/Touching assist Assistive device: Walker-rolling,Orthosis   Walk 50 feet activity   Assist    Assist level: Contact Guard/Touching assist Assistive device: Walker-rolling,Orthosis    Walk 150 feet activity   Assist Walk 150 feet activity did not occur: Safety/medical concerns  Assist level: Contact Guard/Touching assist Assistive device:  Walker-rolling,Orthosis    Walk 10 feet on uneven surface  activity   Assist Walk 10 feet on uneven surfaces activity did not occur: Safety/medical concerns   Assist level: Minimal Assistance - Patient > 75% Assistive device: Walker-rolling,Orthosis   Wheelchair     Assist Will patient use wheelchair at discharge?: No Type of Wheelchair: Manual     Wheelchair assist level: Supervision/Verbal cueing Max wheelchair distance: 200'    Wheelchair 50 feet with 2 turns activity    Assist        Assist Level: Supervision/Verbal cueing   Wheelchair 150 feet activity     Assist  Wheelchair 150 feet activity did not occur: Safety/medical concerns   Assist Level: Supervision/Verbal cueing   BP 122/76 (BP Location: Right Arm)   Pulse 85   Temp 99.7 F (37.6 C) (Oral)   Resp 17   Ht 5\' 10"  (1.778 m)   Wt 77.9 kg   SpO2 100%   BMI 24.64 kg/m   Medical Problem List and Plan: 1.  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  Antithrombotics: -DVT/anticoagulation:  Pharmaceutical: D/ced Lovenox given ambulation             -antiplatelet therapy: N/A 3. Pain Management: Gabapentin tid 4. Mood: Team to provide ego support.              -antipsychotic agents: N/A 5. Neuropsych: This patient is not fully capable of making decisions on his own behalf. 6. Skin/Wound Care: Routine pressure relief measures. 7. Fluids/Electrolytes/Nutrition: Monitor I/Os 8. HTN: Monitor BP tid--continue Norvasc and coreg bid.   Controlled on 3/22  Monitor with increased mobility 9. HIV/AIDS: Now on Bitarvy (has been approved for assistance) with Bactrim daily for prophylaxis.  -discussed T cell counts with patient and his father.              --Imodium prn for diarrhea 10. ETOH abuse with folate/thiamine deficiency:  Completed IV doses of folic acid/Thiamine              On folic acid ane thiamine for supplement.   Counsel 11. Hypomagnesemia:              Received 2 gram IV doses--03/04, 03/06 and 03/8, 4 g on 3/10, 3/14.              Now on daily po supplement, changed on 3/9, increased on 3/15  Increased po supplement to TID, provided list of magnesium rich foods, avoid alcohol at  home.    Magnesium 1.5 on 3/21, labs ordered for tomorrow  PPI DC'd on 3/21 12. GAD/depression: On Zoloft 100 mg bid with low dose ativan bid prn.  13. B/L foot drops: PRAFOs and AFOs  14 Encephalopathy-resolved. 15.  Hyponatremia  Sodium 137 on 3/21  Continue to monitor 16.  Transaminitis  AST elevated on 3/9, labs ordered for tomorrow  Continue to monitor 17.  Sinus tachycardia  Controlled on 3/22 18. Anemia:   Hb 8.1 on 3/17  Continue to monitor 19.  Leukopenia-likely related to medications  WBC 3.3 on 3/17, labs ordered for tomorrow  LOS: 14 days A FACE TO FACE EVALUATION WAS PERFORMED  Laterrica Libman 4/17 08/29/2020, 10:57 AM

## 2020-08-29 NOTE — Progress Notes (Signed)
Occupational Therapy Session Note  Patient Details  Name: Harold Flores MRN: [id number] Date of Birth: 1990/10/27  Today's Date: 08/29/2020 OT Individual Time: 9373-4287   &   1430-1530 OT Individual Time Calculation (min): 58 min    &   60 min   Short Term Goals: Week 2:  OT Short Term Goal 1 (Week 2): patient will complete functional transfers to/from appropriate DME set up/mod I level with RW OT Short Term Goal 2 (Week 2): patient will complete basic HM with RW set up/mod I level OT Short Term Goal 3 (Week 2): patient will complete adl in seated postion set up/mod I level  Skilled Therapeutic Interventions/Progress Updates:    AM session:   Patient in bed, alert and ready for therapy session.   He denies pain and states that he did not sleep all that well last night.  Bed mobility mod I.  Sit to stand from low bed surface CGA for first attempt.   He is able to walk with RW to/from bed, shower bench without AFOs CS.  He is able to gather supplies needed for shower with CS - good carryover of safe technique.  Bathing completed mod I seated on shower bench with hand held shower and grab bars.  Dressing completed seated edge of bed.  He is able to gather clothing and complete dressing tasks mod I seated, CS in stance.   Grooming tasks in stance with CS.  Reviewed balance and support strategies for activities in stance with good understanding demonstrated.  He remained seated edge of bed awaiting PT session.     PM session:   Patient seated edge of bed, ready for therapy session.  He denies pain.  Able to donn shoes/AFOs mod I.  He is able to ambulate with RW to/from therapy gym with CS.  Functional transfers with CS.   Completed coordination assessments: Box and blocks:  L = 48, R = 60 9 hole peg:  L = 33 s, R = 33 s Reaction time:  L = 1.36 s, R = 1.35 s (BITS, 2 min in stance) Independent and timely with saccade/cog activity Reviewed and practiced posterior step and balance strategies  with good carryover.  He returned to bed at close of session, bed alarm set and call bell in reach.       Therapy Documentation Precautions:  Precautions Precautions: Fall Precaution Comments: falls, B knee buckling Restrictions Weight Bearing Restrictions: No   Therapy/Group: Individual Therapy  [name] 08/29/2020, 7:32 AM

## 2020-08-29 NOTE — Progress Notes (Signed)
Physical Therapy Weekly Progress Note  Patient Details  Name: Harold Flores MRN: [id number] Date of Birth: [birth year]  Beginning of progress report period: August 22, 2020 End of progress report period: August 29, 2020  Today's Date: 08/29/2020 PT Individual Time: 0902-1015 PT Individual Time Calculation (min): 73 min   Patient has met 4 of 4 short term goals.  Patient able to achieve all STG's progressing well with core strength, balance, use of AFO's, gait and stairs.  Patient currently S for all mobility with RW and CGA with SBQC.  Still has continued balance and coordination and strength issues as noted below.   Patient continues to demonstrate the following deficits muscle weakness and impaired timing and sequencing, ataxia and decreased coordination and therefore will continue to benefit from skilled PT intervention to increase functional independence with mobility.  Patient progressing toward long term goals..  Continue plan of care.  PT Short Term Goals Week 2:  PT Short Term Goal 1 (Week 2): Patient will perform transfers consistently with S PT Short Term Goal 1 - Progress (Week 2): Met PT Short Term Goal 2 (Week 2): Patient will negotiate 4 steps with rails and S. PT Short Term Goal 2 - Progress (Week 2): Met PT Short Term Goal 3 (Week 2): Patient will ambulate with LRAD, AFO's and S x 150' PT Short Term Goal 3 - Progress (Week 2): Met PT Short Term Goal 4 (Week 2): Patient will perform floor transfers with S. PT Short Term Goal 4 - Progress (Week 2): Met Week 3:  PT Short Term Goal 1 (Week 3): STG=LTG due to ELOS  Skilled Therapeutic Interventions/Progress Updates: Patient on EOB prepared for therapy.  Discussed balance test results from last session and that he will continue to need to use RW for all mobility when unaided.  Discussed limited follow up initially and need for HEP and plans for this session to focus on that.  Patient S for sit to stand and ambulated with RW  and AFO's to therapy gym with S.  Seated on mat review of plans with mother for walking activities on paved level terrain versus in the home or at [facility] with electric scooter close (taking turns with her).  Patient on mat performed HEP as follows: prone knee flexion x 10, prone hip extension x 10, prone planks on knees x 5 sec x 10, sidelying clamshell hip abduction with green t-band 2 x 15, quadruped alternate opposite arm and leg lift with 3 sec hold x 10, sit to stand with 1 UE support x 5, standing with back to wall no UE support x up to 2 minutes, standing eyes closed with walker in front and back to wall with supervision up to 5 sec, standing clock reach x 5 each leg with 1 UE support on counter.  Patient ambulated to dayroom with RW and S.  Seated on Nu Step for UE/LE at level 6 x 6 minutes.  Patient issued HEP and reviewed.  Ambulated to room with RW and S.  Left on EOB with alarm on and needs in reach.  Ambulation/gait training;DME/adaptive equipment instruction;Balance/vestibular training;Therapeutic Activities;UE/LE Coordination activities;Therapeutic Exercise;Splinting/orthotics;Patient/family education;Functional mobility training;Disease management/prevention;Stair training;UE/LE Strength taining/ROM;Wheelchair propulsion/positioning;Neuromuscular re-education;Discharge planning   Therapy Documentation Precautions:  Precautions Precautions: Fall Precaution Comments: falls, B knee buckling Restrictions Weight Bearing Restrictions: No Pain: Pain Assessment Pain Score: 0-No pain  Therapy/Group: Individual Therapy  [name]  [name], [place] 08/29/2020, 4:46 PM

## 2020-08-29 NOTE — Progress Notes (Signed)
Patient ID: [name], male   DOB: 02-Aug-1990, 30 y.o.   MRN: [id number] Follow up with patient regarding questions and prep for discharge. Reviewed referral to [facility] pharmacy and appointment set up with community health and wellness clinic. SW working on [facility] services and ordered recommended DME; reviewed uninsured form he will need to complete for the DME request without insurance. Discussed discharge process and discharge packet review by [facility] on date of discharge. Noted he would like to speak with the medicaid application rep Enrique Sack) again; SW to contact and request return visit to assist with MA application, etc. Patient reports he feels better now that questions and concerns have been addressed.

## 2020-08-29 NOTE — Progress Notes (Signed)
Patient ID: Harold Flores, male   DOB: 04/27/1991, 30 y.o.   MRN: [id number] Met with pt along with Deborah-RN to address pt's questions regarding discharge and reached out to [facility] regarding applying for medicaid and disability applications. She had planned to see pt today prior to his 2:30 therapy session. Made aware have gotten appointment for him at [facility] health and wellness clinic for follow up and assist with medications. He will also be going to the ID clinic for follow up. He wants to wait on the COVID vaccine due to his condition. He will tell his Mom about this she was asking for him to get one while here. Pt's equipment should be delivered to the room prior to discharge and will now tomorrow if can get charity home health for him.

## 2020-08-29 NOTE — Plan of Care (Signed)
  Problem: RH SKIN INTEGRITY Goal: RH STG MAINTAIN SKIN INTEGRITY WITH ASSISTANCE Description: STG Maintain Skin Integrity With Mod I Assistance. Outcome: Progressing   Problem: RH PAIN MANAGEMENT Goal: RH STG PAIN MANAGED AT OR BELOW PT'S PAIN GOAL Description: Patient will maintain pain level less than 4 on 0-10 scale Outcome: Progressing

## 2020-08-29 NOTE — Plan of Care (Signed)
  Problem: RH BOWEL ELIMINATION Goal: RH STG MANAGE BOWEL WITH ASSISTANCE Description: STG Manage Bowel with Mod I Assistance Outcome: Progressing   Problem: RH SKIN INTEGRITY Goal: RH STG MAINTAIN SKIN INTEGRITY WITH ASSISTANCE Description: STG Maintain Skin Integrity With Mod I Assistance. Outcome: Progressing   Problem: RH PAIN MANAGEMENT Goal: RH STG PAIN MANAGED AT OR BELOW PT'S PAIN GOAL Description: Patient will maintain pain level less than 4 on 0-10 scale Outcome: Progressing   Problem: RH KNOWLEDGE DEFICIT GENERAL Goal: RH STG INCREASE KNOWLEDGE OF SELF CARE AFTER HOSPITALIZATION Description: Patient will be able to demonstrate understanding of home medication regimen independently.   Outcome: Progressing   Problem: Consults Goal: RH GENERAL PATIENT EDUCATION Description: See Patient Education module for education specifics. Outcome: Progressing

## 2020-08-30 DIAGNOSIS — K219 Gastro-esophageal reflux disease without esophagitis: Secondary | ICD-10-CM

## 2020-08-30 LAB — COMPREHENSIVE METABOLIC PANEL
ALT: 20 U/L (ref 0–44)
AST: 28 U/L (ref 15–41)
Albumin: 3.1 g/dL — ABNORMAL LOW (ref 3.5–5.0)
Alkaline Phosphatase: 65 U/L (ref 38–126)
Anion gap: 7 (ref 5–15)
BUN: 8 mg/dL (ref 6–20)
CO2: 26 mmol/L (ref 22–32)
Calcium: 9.6 mg/dL (ref 8.9–10.3)
Chloride: 105 mmol/L (ref 98–111)
Creatinine, Ser: 0.79 mg/dL (ref 0.61–1.24)
GFR, Estimated: >60 mL/min (ref >60)
Glucose, Bld: 88 mg/dL (ref 70–99)
Potassium: 3.9 mmol/L (ref 3.5–5.1)
Sodium: 138 mmol/L (ref 135–145)
Total Bilirubin: 0.6 mg/dL (ref 0.3–1.2)
Total Protein: 7.1 g/dL (ref 6.5–8.1)

## 2020-08-30 LAB — CBC WITH DIFFERENTIAL/PLATELET
Abs Immature Granulocytes: 0 10*3/uL (ref 0.00–0.07)
Basophils Absolute: 0 10*3/uL (ref 0.0–0.1)
Basophils Relative: 1 %
Eosinophils Absolute: 0.1 10*3/uL (ref 0.0–0.5)
Eosinophils Relative: 2 %
HCT: 26.3 % — ABNORMAL LOW (ref 39.0–52.0)
Hemoglobin: 8.9 g/dL — ABNORMAL LOW (ref 13.0–17.0)
Immature Granulocytes: 0 %
Lymphocytes Relative: 37 %
Lymphs Abs: 1.7 10*3/uL (ref 0.7–4.0)
MCH: 32 pg (ref 26.0–34.0)
MCHC: 33.8 g/dL (ref 30.0–36.0)
MCV: 94.6 fL (ref 80.0–100.0)
Monocytes Absolute: 1 10*3/uL (ref 0.1–1.0)
Monocytes Relative: 22 %
Neutro Abs: 1.8 10*3/uL (ref 1.7–7.7)
Neutrophils Relative %: 38 %
Platelets: 483 10*3/uL — ABNORMAL HIGH (ref 150–400)
RBC: 2.78 MIL/uL — ABNORMAL LOW (ref 4.22–5.81)
RDW: 15 % (ref 11.5–15.5)
WBC: 4.6 10*3/uL (ref 4.0–10.5)
nRBC: 0 % (ref 0.0–0.2)

## 2020-08-30 LAB — MAGNESIUM: Magnesium: 1.6 mg/dL — ABNORMAL LOW (ref 1.7–2.4)

## 2020-08-30 MED ORDER — ACETAMINOPHEN-CODEINE #3 300-30 MG PO TABS: 1.0000 | ORAL_TABLET | ORAL | Status: DC | PRN

## 2020-08-30 NOTE — Plan of Care (Signed)
  Problem: RH Balance Goal: LTG Patient will maintain dynamic sitting balance (PT) Description: LTG:  Patient will maintain dynamic sitting balance with assistance during mobility activities (PT) Outcome: Completed/Met Goal: LTG Patient will maintain dynamic standing balance (PT) Description: LTG:  Patient will maintain dynamic standing balance with assistance during mobility activities (PT) Outcome: Completed/Met   Problem: Sit to Stand Goal: LTG:  Patient will perform sit to stand with assistance level (PT) Description: LTG:  Patient will perform sit to stand with assistance level (PT) Outcome: Completed/Met   Problem: RH Bed Mobility Goal: LTG Patient will perform bed mobility with assist (PT) Description: LTG: Patient will perform bed mobility with assistance, with/without cues (PT). Outcome: Completed/Met   Problem: RH Bed to Chair Transfers Goal: LTG Patient will perform bed/chair transfers w/assist (PT) Description: LTG: Patient will perform bed to chair transfers with assistance (PT). Outcome: Completed/Met   Problem: RH Car Transfers Goal: LTG Patient will perform car transfers with assist (PT) Description: LTG: Patient will perform car transfers with assistance (PT). Outcome: Completed/Met   Problem: RH Floor Transfers Goal: LTG Patient will perform floor transfers w/assist (PT) Description: LTG: Patient will perform floor transfers with assistance (PT). Outcome: Completed/Met   Problem: RH Floor Transfers Goal: LTG Patient will perform floor transfers w/assist (PT) Description: LTG: Patient will perform floor transfers with assistance (PT). Outcome: Completed/Met   Problem: RH Ambulation Goal: LTG Patient will ambulate in controlled environment (PT) Description: LTG: Patient will ambulate in a controlled environment, # of feet with assistance (PT). Outcome: Completed/Met Goal: LTG Patient will ambulate in home environment (PT) Description: LTG: Patient will  ambulate in home environment, # of feet with assistance (PT). Outcome: Completed/Met   Problem: RH Wheelchair Mobility Goal: LTG Patient will propel w/c in controlled environment (PT) Description: LTG: Patient will propel wheelchair in controlled environment, # of feet with assist (PT) Outcome: Completed/Met   Problem: RH Stairs Goal: LTG Patient will ambulate up and down stairs w/assist (PT) Description: LTG: Patient will ambulate up and down # of stairs with assistance (PT) Outcome: Adequate for Discharge Note: Occasional CGA for safety on stairs  [name], PT

## 2020-08-30 NOTE — Progress Notes (Signed)
Physical Therapy Session Note  Patient Details  Name: Harold Flores MRN: [id number] Date of Birth: [birth year]  Today's Date: 08/30/2020 PT Individual Time: 1300-1430 PT Individual Time Calculation (min): 90 min   Short Term Goals: Week 3:  PT Short Term Goal 1 (Week 3): STG=LTG due to ELOS  Skilled Therapeutic Interventions/Progress Updates:    Patient seen for community outing with TR.  Transferred to w/c with S.  Propelled in w/c with S to elevator and out to meet van. Patient sit to stand to RW with S after w/c set up and negotiated van steps with min A using pole and back of passenger seat.  During transport to destination ([employer] at [facility]), discussed goals, issues with access, issues with attitudes, and planning out rest breaks for energy conservation.  Patient descended [place] steps with two hands to pole and min A.  Ambulated on unlevel paved surface x 70' to door with S.  Patient attempting to manage door and push RW, performed with CGA.  Patient standing to order and wait for order while discussing options for getting beverage to table.  Pt asked for assistance and ambulated to table up small ramp and able to pull out seat and sit safely with good hand placement.  Discussed options for bathrooms while out with his mom if needed and having discussions ahead of time to avoid issues when out.  Patient sit to stand with S to RW and ambulated to bathroom to observe environment and noted unisex single bathrooms with grabbars and wide entry.  Patient ambulated 130' out and back to [place].  Mod A to access van due to 13.5" step up.  Patient debriefed with issues related to access and ways to get around issues.  Patient descended steps with min and ambulated to room with RW and S over 300'.  Seated EOB for strength assessment.  Left with call bell in reach and bed alarm active.    Therapy Documentation Precautions:  Precautions Precautions: Fall Precaution Comments: falls, B knee  buckling Restrictions Weight Bearing Restrictions: No Pain: Pain Assessment Pain Score: 0-No pain   Therapy/Group: Individual Therapy  Elray Mcgregor  Sheran Lawless, [place] 08/30/2020, 4:46 PM

## 2020-08-30 NOTE — Progress Notes (Signed)
Patient ID: Harold Flores, male   DOB: 27-Apr-1991, 30 y.o.   MRN: [id number]  Met with pt to discuss team conference progress and discharge tomorrow. He feels ready and is going on an outing today with therapy. Equipment in room and awaiting if can get charity home health. Match in system for assist with medications. Appointment [facility] for 4/28 @ 2:00 pm. See in am for any last minute questions or concerns.

## 2020-08-30 NOTE — Progress Notes (Signed)
Occupational Therapy Session Note  Patient Details  Name: SHYQUAN STALLBAUMER MRN: [id number] Date of Birth: [birth year]  Today's Date: 08/30/2020 OT Individual Time: 0177-9390 OT Individual Time Calculation (min): 57 min    Short Term Goals: Week 2:  OT Short Term Goal 1 (Week 2): patient will complete functional transfers to/from appropriate DME set up/mod I level with RW OT Short Term Goal 2 (Week 2): patient will complete basic HM with RW set up/mod I level OT Short Term Goal 3 (Week 2): patient will complete adl in seated postion set up/mod I level  Skilled Therapeutic Interventions/Progress Updates:    Patient seated edge of bed, dressed and ready for therapy session.  Mod I to donn AFO, socks and shoes.  He ambulates with RW to and from all therapy treatment spaces with CS.  Completed tests and measures for discharge assessments, completed UB ergometer in stance 2 x 5 minutes.  Reviewed DME, safety and HEP for discharge.  He demonstrates good understanding and readiness for discharge to home tomorrow.  He returned to sitting edge of bed, bed alarm set and all needs with in reach.    Therapy Documentation Precautions:  Precautions Precautions: Fall Precaution Comments: falls, B knee buckling Restrictions Weight Bearing Restrictions: No   Therapy/Group: Individual Therapy  [name] 08/30/2020, 7:32 AM

## 2020-08-30 NOTE — Progress Notes (Signed)
Recreational Therapy Session Note  Patient Details  Name: Harold Flores MRN: [id number] Date of Birth: 07/10/1990 Today's Date: 08/30/2020   Time:1300-1420 Pain:no c/o  Pt participated in community reintegration/outing to [employer] at overall contact guard assist ambulatory level using RW. Goals focused on safe community mobility, identification & negotiation of obstacles, accessing public restroom, energy conservation techniques/education.  See outing goal sheet in shadow chart for full details.  Therapy/Group: [employer] Idrees Quam 08/30/2020, 4:20 PM

## 2020-08-30 NOTE — Progress Notes (Signed)
Physical Therapy Discharge Summary  Patient Details  Name: Harold Flores MRN: [id number] Date of Birth: Feb 15, 1991   Patient has met 10 of 11 long term goals due to improved activity tolerance, improved balance, improved postural control, increased strength and ability to compensate for deficits.  Patient to discharge at an ambulatory level Supervision.   Patient's care partner unavailable and given education via phone for supervision level of assistance to provide the necessary physical assistance at discharge.  Reasons goals not met: Patient able to negotiate steps with S, but inconsistent, needing CGA for safety.  Recommendation:  Patient will benefit from ongoing skilled PT services in extensive HEP given and hopeful for charity HHPT to continue to advance safe functional mobility, address ongoing impairments in strength, balance, endurance, sensory deficits, and minimize fall risk.  Equipment: bilateral AFO's and RW  Reasons for discharge: treatment goals met and discharge from hospital  Patient/family agrees with progress made and goals achieved: Yes  PT Discharge Precautions/Restrictions Precautions Precautions: Fall Required Braces or Orthoses: Other Brace Other Brace: bilateral AFO's Restrictions Weight Bearing Restrictions: No Vital Signs Therapy Vitals Temp: 98.2 F (36.8 C) Pulse Rate: 95 Resp: 18 BP: 134/83 Patient Position (if appropriate): Lying Oxygen Therapy SpO2: 100 % O2 Device: Room Air Pain Pain Assessment Pain Score: 0-No pain Vision/Perception  Perception Perception: Within Functional Limits Praxis Praxis: Intact  Cognition Overall Cognitive Status: Within Functional Limits for tasks assessed Arousal/Alertness: Awake/alert Orientation Level: Oriented X4 Awareness: Appears intact Problem Solving: Appears intact Safety/Judgment: Appears intact Sensation Sensation Light Touch Impaired Details: Impaired LLE;Impaired RLE Hot/Cold: Appears  Intact Additional Comments: decreased in hands and feet Coordination Gross Motor Movements are Fluid and Coordinated: No Fine Motor Movements are Fluid and Coordinated: No Coordination and Movement Description: mild ataxia still present in LE's Finger Nose Finger Test: improving, mild dysmetria 9 Hole Peg Test: L = 33s, R = 33s,     box and blocks:  L = 48, R = 60 Motor  Motor Motor: Ataxia Motor - Discharge Observations: ongoing foot drop bilateral, AFOs  Mobility Bed Mobility Bed Mobility: Sit to Supine;Supine to Sit Supine to Sit: Independent Sit to Supine: Independent Transfers Transfers: Sit to Stand;Stand to [employer] Transfers Sit to Stand: Independent with assistive device Stand to Sit: Independent with assistive device Stand Pivot Transfers: Supervision/Verbal cueing Transfer (Assistive device): Rolling walker Locomotion  Gait Ambulation: Yes Gait Assistance: Supervision/Verbal cueing Gait Distance (Feet): 300 Feet Assistive device: Rolling walker;Other (Comment) (bilateral AFO's) Gait Gait: Yes Gait Pattern: Impaired Gait Pattern: Step-through pattern;Decreased dorsiflexion - left;Decreased dorsiflexion - right;Ataxic Stairs / Additional Locomotion Stairs: Yes Stairs Assistance: Contact Guard/Touching assist Stair Management Technique: Two rails;Step to pattern;Forwards Number of Stairs: 8 Height of Stairs: 6 Ramp: Supervision/Verbal cueing Curb: Nurse, mental health Mobility: Yes Wheelchair Assistance: Chartered loss adjuster: Both upper extremities Wheelchair Parts Management: Supervision/cueing Distance: 200'+  Trunk/Postural Assessment  Cervical Assessment Cervical Assessment: Within Functional Limits Thoracic Assessment Thoracic Assessment: Within Functional Limits Lumbar Assessment Lumbar Assessment: Within Functional Limits Postural Control Trunk Control: still with core  instability/weakness; compensates with RW and UE support  Balance Static Sitting Balance Static Sitting - Balance Support: Feet supported Static Sitting - Level of Assistance: 7: Independent Dynamic Sitting Balance Dynamic Sitting - Level of Assistance: 7: Independent Static Standing Balance Static Standing - Level of Assistance: 6: Modified independent (Device/Increase time) Static Standing - Comment/# of Minutes: with UE support Dynamic Standing Balance Dynamic Standing - Balance Support: During functional activity;Bilateral  upper extremity supported;Right upper extremity supported;Left upper extremity supported Dynamic Standing - Level of Assistance: 5: Stand by assistance Extremity Assessment  RUE Assessment RUE Assessment: Within Functional Limits General Strength Comments: 4+/5 t/o     grip 50#,  pinches:  2 pt 5#, 3 pt 6#, lat 7# LUE Assessment LUE Assessment: Within Functional Limits General Strength Comments: 4+/5 t/o     grip 50#,  pinches:  2 pt 4#, 3 pt 6#, lat 6# RLE Assessment Active Range of Motion (AROM) Comments: WFL General Strength Comments: hip flexion 4/5, knee extension 4/5, flexion 4-/5, ankle DF 1/5, PF 3-/5 LLE Assessment LLE Assessment: Exceptions to [facility] Active Range of Motion (AROM) Comments: [facility] General Strength Comments: Hip flexion 4/5, knee extension 4/5, flexion 4-/5, ankle DF 1+/5, plantarflexion 3-/5    [name]  [name], PT 08/30/2020, 5:07 PM

## 2020-08-30 NOTE — Plan of Care (Signed)
  Problem: RH BOWEL ELIMINATION Goal: RH STG MANAGE BOWEL WITH ASSISTANCE Description: STG Manage Bowel with Mod I Assistance Outcome: Progressing   Problem: RH SKIN INTEGRITY Goal: RH STG MAINTAIN SKIN INTEGRITY WITH ASSISTANCE Description: STG Maintain Skin Integrity With Mod I Assistance. Outcome: Progressing   Problem: RH PAIN MANAGEMENT Goal: RH STG PAIN MANAGED AT OR BELOW PT'S PAIN GOAL Description: Patient will maintain pain level less than 4 on 0-10 scale Outcome: Progressing   Problem: RH KNOWLEDGE DEFICIT GENERAL Goal: RH STG INCREASE KNOWLEDGE OF SELF CARE AFTER HOSPITALIZATION Description: Patient will be able to demonstrate understanding of home medication regimen independently.   Outcome: Progressing   Problem: Consults Goal: RH GENERAL PATIENT EDUCATION Description: See Patient Education module for education specifics. Outcome: Progressing   

## 2020-08-30 NOTE — Progress Notes (Addendum)
[place] PHYSICAL MEDICINE & REHABILITATION PROGRESS NOTE  Subjective/Complaints: Patient seen sitting up at the edge of his bed this AM.  He states he slept well overnight.  He is working with therapies.  He has questions regarding Mag.  ROS: Denies CP, SOB, N/V/D  Objective: Vital Signs: Blood pressure 112/70, pulse 89, temperature 98.6 F (37 C), resp. rate 17, height 5\' 10"  (1.778 m), weight 77.9 kg, SpO2 100 %. No results found. Recent Labs    08/30/20 0502  WBC 4.6  HGB 8.9*  HCT 26.3*  PLT 483*   Recent Labs    08/28/20 0629 08/30/20 0502  NA 137 138  K 3.6 3.9  CL 104 105  CO2 25 26  GLUCOSE 88 88  BUN 6 8  CREATININE 0.62 0.79  CALCIUM 9.3 9.6    Intake/Output Summary (Last 24 hours) at 08/30/2020 1035 Last data filed at 08/30/2020 0912 Gross per 24 hour  Intake 555 ml  Output 1500 ml  Net -945 ml        Physical Exam: BP 112/70 (BP Location: Right Arm)   Pulse 89   Temp 98.6 F (37 C)   Resp 17   Ht 5\' 10"  (1.778 m)   Wt 77.9 kg   SpO2 100%   BMI 24.64 kg/m   Constitutional: No distress . Vital signs reviewed. HENT: Normocephalic.  Atraumatic. Eyes: EOMI. No discharge. Cardiovascular: No JVD.  RRR. Respiratory: Normal effort.  No stridor.  Bilateral clear to auscultation. GI: Non-distended.  BS +. Skin: Warm and dry.  Intact. Psych: Normal mood.  Normal behavior. Musc: No edema in extremities.  No tenderness in extremities. Neuro: Alert Motor:  Bilateral UE: 4-4+/5 proximal to distal Bilateral lower extremities: Hip flexion, knee extension 4/5, ankle dorsiflexion 2-/5, unchanged Sensation diminished to light touch bilateral feet  Assessment/Plan: 1. Functional deficits which require 3+ hours per day of interdisciplinary therapy in a comprehensive inpatient rehab setting.  Physiatrist is providing close team supervision and 24 hour management of active medical problems listed below.  Physiatrist and rehab team continue to assess  barriers to discharge/monitor patient progress toward functional and medical goals   Care Tool:  Bathing    Body parts bathed by patient: Right arm,Left arm,Front perineal area,Chest,Abdomen,Right upper leg,Left upper leg,Right lower leg,Left lower leg,Face,Buttocks   Body parts bathed by helper: Buttocks     Bathing assist Assist Level: Independent with assistive device     Upper Body Dressing/Undressing Upper body dressing   What is the patient wearing?: Pull over shirt    Upper body assist Assist Level: Independent    Lower Body Dressing/Undressing Lower body dressing      What is the patient wearing?: Underwear/pull up,Pants     Lower body assist Assist for lower body dressing: Supervision/Verbal cueing     Toileting Toileting    Toileting assist Assist for toileting: Supervision/Verbal cueing     Transfers Chair/bed transfer  Transfers assist     Chair/bed transfer assist level: Supervision/Verbal cueing     Locomotion Ambulation   Ambulation assist      Assist level: Supervision/Verbal cueing Assistive device: Walker-rolling (B AFO) Max distance: 200'   Walk 10 feet activity   Assist     Assist level: Supervision/Verbal cueing Assistive device: Walker-rolling,Orthosis   Walk 50 feet activity   Assist    Assist level: Supervision/Verbal cueing Assistive device: Walker-rolling,Orthosis    Walk 150 feet activity   Assist Walk 150 feet activity did not occur: Safety/medical  concerns  Assist level: Supervision/Verbal cueing Assistive device: Walker-rolling,Orthosis    Walk 10 feet on uneven surface  activity   Assist Walk 10 feet on uneven surfaces activity did not occur: Safety/medical concerns   Assist level: Minimal Assistance - Patient > 75% Assistive device: Walker-rolling,Orthosis   Wheelchair     Assist Will patient use wheelchair at discharge?: No Type of Wheelchair: Manual    Wheelchair assist level:  Supervision/Verbal cueing Max wheelchair distance: 200'    Wheelchair 50 feet with 2 turns activity    Assist        Assist Level: Supervision/Verbal cueing   Wheelchair 150 feet activity     Assist  Wheelchair 150 feet activity did not occur: Safety/medical concerns   Assist Level: Supervision/Verbal cueing   Medical Problem List and Plan: 1.  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 Please see conference note from today as well.  2.  Antithrombotics: -DVT/anticoagulation:  Pharmaceutical: D/ced Lovenox given ambulation             -antiplatelet therapy: N/A 3. Pain Management: Gabapentin tid 4. Mood: Team to provide ego support.              -antipsychotic agents: N/A 5. Neuropsych: This patient is not fully capable of making decisions on his own behalf. 6. Skin/Wound Care: Routine pressure relief measures. 7. Fluids/Electrolytes/Nutrition: Monitor I/Os 8. HTN: Monitor BP tid--continue Norvasc and coreg bid.   Controlled on 3/23  Monitor with increased mobility 9. HIV/AIDS: Now on Bitarvy (has been approved for assistance) with Bactrim daily for prophylaxis.  -discussed T cell counts with patient and his father.              --Imodium prn for diarrhea 10. ETOH abuse with folate/thiamine deficiency:  Completed IV doses of folic acid/Thiamine              On folic acid ane thiamine for supplement.   Counsel 11. Hypomagnesemia:              Received 2 gram IV doses--03/04, 03/06 and 03/8, 4 g on 3/10, 3/14.              Now on daily po supplement, changed on 3/9, increased on 3/15  Increased po supplement to TID,  provided list of magnesium rich foods, avoid alcohol at home.    Magnesium 1.5 on 3/21, labs pending  PPI DC'd on 3/21 12. GAD/depression: On Zoloft 100 mg bid with low dose ativan bid prn.  13. B/L foot drops: PRAFOs and AFOs  14 Encephalopathy-resolved. 15.  Hyponatremia  Sodium 138 on 3/23  Continue to monitor 16.  Transaminitis: Resolved  Continue to monitor 17.  Sinus tachycardia  Controlled on 3/23 18. Anemia:   Hb 8.9 on 3/23  Continue to monitor 19.  Leukopenia-likely related to medications  WBC 4.6 on 3/23 20. GERD  See #11  May consider q48 hour dosing as patient states he can go 2-3 days without medication before symptoms reoccur  LOS: 15 days A FACE TO FACE EVALUATION WAS PERFORMED  Matan Steen Karis Juba 08/30/2020, 10:35 AM

## 2020-08-30 NOTE — Progress Notes (Signed)
Occupational Therapy Discharge Summary  Patient Details  Name: Harold Flores MRN: [id number] Date of Birth: Mar 26, 1991     Patient has met 12 of 12 long term goals due to improved activity tolerance, improved balance, postural control, ability to compensate for deficits, functional use of  RIGHT upper, RIGHT lower, LEFT upper and LEFT lower extremity and improved coordination.  Patient to discharge at overall Supervision level.  Patient's care partner is independent to provide the necessary physical assistance at discharge.    Reasons goals not met: na   Recommendation:  Patient will benefit from ongoing skilled OT services in outpatient setting to continue to advance functional skills in the area of BADL, iADL and Vocation.  Equipment: commode, shower seat with back  Reasons for discharge: treatment goals met  Patient/family agrees with progress made and goals achieved: Yes  OT Discharge Precautions/Restrictions  Precautions Precautions: Fall Restrictions Weight Bearing Restrictions: No General   Vital Signs Therapy Vitals Temp: 98.2 F (36.8 C) Pulse Rate: 95 Resp: 18 BP: 134/83 Patient Position (if appropriate): Lying Oxygen Therapy SpO2: 100 % O2 Device: Room Air Pain   ADL ADL Eating: Independent Where Assessed-Eating: Chair Grooming: Modified independent Where Assessed-Grooming: Sitting at sink,Standing at sink Upper Body Bathing: Independent Where Assessed-Upper Body Bathing: Shower Lower Body Bathing: Modified independent Where Assessed-Lower Body Bathing: Shower Upper Body Dressing: Independent Where Assessed-Upper Body Dressing: Edge of bed Lower Body Dressing: Modified independent Where Assessed-Lower Body Dressing: Edge of bed Toileting: Modified independent Where Assessed-Toileting: Glass blower/designer: Close supervision Toilet Transfer Method: Counselling psychologist: Grab bars,Raised toilet seat Tub/Shower Transfer:  Close supervison Clinical cytogeneticist Method: Optometrist: Civil engineer, contracting with back Social research officer, government: Close supervision Social research officer, government Method: Heritage manager: Civil engineer, contracting with back Vision Baseline Vision/History: No visual deficits Patient Visual Report: No change from baseline Vision Assessment?: No apparent visual deficits Perception  Perception: Within Functional Limits Praxis Praxis: Intact Cognition Overall Cognitive Status: Within Functional Limits for tasks assessed Arousal/Alertness: Awake/alert Orientation Level: Oriented X4 Awareness: Appears intact Problem Solving: Appears intact Safety/Judgment: Appears intact Sensation Sensation Hot/Cold: Appears Intact Coordination Finger Nose Finger Test: improving, mild dysmetria 9 Hole Peg Test: L = 33s, R = 33s,     box and blocks:  L = 48, R = 60 Motor  Motor Motor - Discharge Observations: ongoing foot drop bilateral, AFOs Mobility  Bed Mobility Bed Mobility: Sit to Supine Supine to Sit: Independent Sit to Supine: Independent Transfers Sit to Stand: Independent with assistive device Stand to Sit: Independent with assistive device  Trunk/Postural Assessment  Cervical Assessment Cervical Assessment: Within Functional Limits Thoracic Assessment Thoracic Assessment: Within Functional Limits Lumbar Assessment Lumbar Assessment: Within Functional Limits  Balance Static Sitting Balance Static Sitting - Level of Assistance: 7: Independent Dynamic Sitting Balance Dynamic Sitting - Level of Assistance: 7: Independent Static Standing Balance Static Standing - Level of Assistance: 6: Modified independent (Device/Increase time) Dynamic Standing Balance Dynamic Standing - Level of Assistance: 5: Stand by assistance Extremity/Trunk Assessment RUE Assessment RUE Assessment: Within Functional Limits General Strength Comments: 4+/5 t/o     grip 50#,  pinches:  2 pt 5#, 3  pt 6#, lat 7# LUE Assessment LUE Assessment: Within Functional Limits General Strength Comments: 4+/5 t/o     grip 50#,  pinches:  2 pt 4#, 3 pt 6#, lat 6#   Stacey A Kinter 08/30/2020, 3:55 PM

## 2020-08-30 NOTE — Patient Care Conference (Signed)
Inpatient RehabilitationTeam Conference and Plan of Care Update Date: 08/30/2020   Time: 11:39 AM   Patient Name: Harold Flores      Medical Record Number: [id number]  Date of Birth: February 13, 1991 Sex: Male         Room/Bed: 4W22C/4W22C-01 Payor Info: Payor: /    Admit Date/Time:  08/15/2020  5:24 PM  Primary Diagnosis:  Progressive focal motor weakness  Hospital Problems: Principal Problem:   Progressive focal motor weakness Active Problems:   Transaminitis   ETOH abuse   Sinus tachycardia   Essential hypertension   AIDS (HCC)   Anemia   Leukopenia   GBS (Guillain Barre syndrome) (HCC)   Gastroesophageal reflux disease    Expected Discharge Date: Expected Discharge Date: 08/31/20  Team Members Present: Physician leading conference: Dr. Maryla Morrow Care Coodinator Present: Chana Bode, RN, BSN, CRRN;Becky Dupree, LCSW Nurse Present: Chana Bode, RN PT Present: Sheran Lawless, PT OT Present: Towanda Malkin, OT PPS Coordinator present : Edson Snowball, PT     Current Status/Progress Goal Weekly Team Focus  Bowel/Bladder   continent of bladder bowel  maintain regular b/b  toilet prn   Swallow/Nutrition/ Hydration             ADL's   set up/supervision in stance, ready for discharge on Thursday  set up/supervision  discharge prep   Mobility   S with RW CGA with QC, min A no device with AFO's x 200', 8 steps with rails and S, floor transfer S  S overall  orthotic training, balance, LE strength, core strength, coordination   Communication             Safety/Cognition/ Behavioral Observations            Pain   BLE pain  pt will be free of pain  assess pain   Skin   intact  free of infection or breakdown        Discharge Planning:  Home with Mom who can be there, but at tmes needs to go to her MD appointments. Pt set up with [facility] and match in place. Trying to get charity [facility] services.   Team Discussion: Magnesium deficit despite supplement. MD to recheck labs  after discontinued PPI.   Patient on target to meet rehab goals: yes, currently supervision for ambulation with braces on and using rails for steps. Self care is mod I - supervision after set up.  *See Care Plan and progress notes for long and short-term goals.   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 HIV/AIDs as well as Etoh abuse  Barriers to Discharge: Medical stability;Decreased family/caregiver support;Other (comments)  Barriers to Discharge Comments: hx of Etoh abuse, means for medications/therapies Possible Resolutions to [employer] Focus: Therapies, follow labs- Mag, optimize PPI dosing, patient and family edu   Continued Need for Acute Rehabilitation Level of Care: The patient requires daily medical management by a physician with specialized training in physical medicine and rehabilitation for the following reasons: Direction of a multidisciplinary physical rehabilitation program to maximize functional independence : Yes Medical management of patient stability for increased activity during participation in an intensive rehabilitation regime.:  Yes Analysis of laboratory values and/or radiology reports with any subsequent need for medication adjustment and/or medical intervention. : Yes   I attest that I was present, lead the team conference, and concur with the assessment and plan of the team.   Pamelia Hoit 08/30/2020, 4:24 PM

## 2020-08-30 NOTE — Progress Notes (Signed)
Occupational Therapy Session Note  Patient Details  Name: Harold Flores MRN: [id number] Date of Birth: June 09, 1991  1115-1200 45 min group   Short Term Goals: Week 1:  OT Short Term Goal 1 (Week 1): Pt will complete toilet transfer with min A + AE PRN OT Short Term Goal 1 - Progress (Week 1): Met OT Short Term Goal 2 (Week 1): Pt will complete grooming at sink in standing with min A for > 2 min with AE PRN. OT Short Term Goal 2 - Progress (Week 1): Met OT Short Term Goal 3 (Week 1): Pt will don pants with min A + AE PRN. OT Short Term Goal 3 - Progress (Week 1): Met  Skilled Therapeutic Interventions/Progress Updates:    Pt participated in rhythmic drumming group. Pain not stated during session. Focus of group on BUE coordination, strengthening, endurance, timing/control, activity tolerance, and social participation and engagement. Pt performs session from seated position for energy conservation. Skilled interventions included having a large ROM to improve strengthening/cardioendurance. Warm up performed prior to exercises and UB stretching completed at end of group with demo from OT. Pt able to select preferred song to share with group. Returned pt to room at end of session. Exited session with pt seated in bed, exit alarm on and call light in reach Therapy Documentation Precautions:  Precautions Precautions: Fall Precaution Comments: falls, B knee buckling Restrictions Weight Bearing Restrictions: No General:   Vital Signs: Therapy Vitals Temp: 98.6 F (37 C) Pulse Rate: 89 Resp: 17 BP: 112/70 Patient Position (if appropriate): Lying Oxygen Therapy SpO2: 100 % O2 Device: Room Air Pain:   ADL: ADL Eating: Set up Where Assessed-Eating: Wheelchair Grooming: Setup Where Assessed-Grooming: Sitting at [employer] Upper Body Bathing: Supervision/safety Where Assessed-Upper Body Bathing: Shower Lower Body Bathing: Minimal assistance Where Assessed-Lower Body Bathing:  Shower Upper Body Dressing: Minimal assistance Where Assessed-Upper Body Dressing: Edge of bed Lower Body Dressing: Moderate assistance Where Assessed-Lower Body Dressing: Edge of bed Toileting: Moderate assistance Where Assessed-Toileting: Glass blower/designer: Moderate assistance Toilet Transfer Method: Stand pivot Toilet Transfer Equipment: Bedside commode,Grab bars Social research officer, government: Moderate assistance Social research officer, government Method: Radiographer, therapeutic: Nurse, learning disability    Praxis   Exercises:   Other Treatments:     Therapy/Group: Group Therapy  Tonny Branch 08/30/2020, 6:45 AM

## 2020-08-31 ENCOUNTER — Other Ambulatory Visit (HOSPITAL_COMMUNITY): Payer: Self-pay | Admitting: Physical Medicine and Rehabilitation

## 2020-08-31 MED ORDER — THIAMINE HCL 100 MG PO TABS: 100.0000 mg | ORAL_TABLET | Freq: Every day | ORAL | 0 refills | Status: DC

## 2020-08-31 MED ORDER — GABAPENTIN 400 MG PO CAPS: 400.0000 mg | ORAL_CAPSULE | Freq: Three times a day (TID) | ORAL | 0 refills | Status: DC

## 2020-08-31 MED ORDER — MAGNESIUM GLUCONATE 500 MG PO TABS: 500.0000 mg | ORAL_TABLET | Freq: Three times a day (TID) | ORAL | 0 refills | Status: DC

## 2020-08-31 MED ORDER — FOLIC ACID 1 MG PO TABS: 1.0000 mg | ORAL_TABLET | Freq: Every day | ORAL | 0 refills | Status: DC

## 2020-08-31 MED ORDER — SERTRALINE HCL 100 MG PO TABS: 100.0000 mg | ORAL_TABLET | Freq: Every day | ORAL | 1 refills | Status: DC

## 2020-08-31 MED ORDER — NICOTINE POLACRILEX 2 MG MT GUM
2.0000 mg | CHEWING_GUM | OROMUCOSAL | 0 refills | Status: DC | PRN
Start: 2020-08-31 — End: 2020-08-31

## 2020-08-31 MED ORDER — LOPERAMIDE HCL 2 MG PO CAPS
2.0000 mg | ORAL_CAPSULE | ORAL | 0 refills | Status: DC | PRN
Start: 2020-08-31 — End: 2020-08-31

## 2020-08-31 MED ORDER — SULFAMETHOXAZOLE-TRIMETHOPRIM 800-160 MG PO TABS
1.0000 | ORAL_TABLET | Freq: Every day | ORAL | 0 refills | Status: DC
Start: 2020-08-31 — End: 2020-08-31

## 2020-08-31 MED ORDER — FUROSEMIDE 20 MG PO TABS: 20.0000 mg | ORAL_TABLET | Freq: Every day | ORAL | 0 refills | Status: DC | PRN

## 2020-08-31 MED ORDER — AMLODIPINE BESYLATE 10 MG PO TABS: 10.0000 mg | ORAL_TABLET | Freq: Every day | ORAL | 0 refills | Status: DC

## 2020-08-31 MED ORDER — BICTEGRAVIR-EMTRICITAB-TENOFOV 50-200-25 MG PO TABS: 1.0000 | ORAL_TABLET | Freq: Every day | ORAL | 0 refills | Status: DC

## 2020-08-31 MED ORDER — CARVEDILOL 12.5 MG PO TABS: ORAL_TABLET | ORAL | 0 refills | Status: DC

## 2020-08-31 MED FILL — AMLODIPINE BESYLATE 10 MG T: 10 | 30 days supply | Qty: 30 | Fill #0

## 2020-08-31 MED FILL — VITAMIN B-1 100 MG TABS: 100 | 30 days supply | Qty: 30 | Fill #0

## 2020-08-31 MED FILL — SULFAMETHOXAZOLE-TMP DS TAB: 800-160 | 30 days supply | Qty: 30 | Fill #0

## 2020-08-31 MED FILL — FOLIC ACID 1 MG TABS: 1 | 30 days supply | Qty: 30 | Fill #0

## 2020-08-31 MED FILL — GABAPENTIN 400 MG CAPSULE: 400 | 30 days supply | Qty: 90 | Fill #0

## 2020-08-31 MED FILL — LOPERAMIDE 2 MG CAPSULE: 2 | 30 days supply | Qty: 30 | Fill #0

## 2020-08-31 MED FILL — FUROSEMIDE 20 MG TAB: 20 | 30 days supply | Qty: 30 | Fill #0

## 2020-08-31 MED FILL — BIKTARVY 50-200-25 MG TABS: 50-200-25 | 30 days supply | Qty: 30 | Fill #0

## 2020-08-31 MED FILL — SERTRALINE HCL 100 MG TAB: 100 | 30 days supply | Qty: 30 | Fill #0

## 2020-08-31 MED FILL — MAGNESIUM OXIDE 400 MG TABS: 400 | 30 days supply | Qty: 90 | Fill #0

## 2020-08-31 MED FILL — CARVEDILOL 12.5 MG TABLET: 12.5 | 30 days supply | Qty: 60 | Fill #0

## 2020-08-31 NOTE — Progress Notes (Signed)
Inpatient Rehabilitation Care Coordinator Discharge Note  The overall goal for the admission was met for:   Discharge location: Yes-HOME WITH MOM WHO CAN PROVIDE SUPERVISION LEVEL  Length of Stay: Yes-16 DAYS  Discharge activity level: Yes-SUPERVISION LEVEL  Home/community participation: Yes  Services provided included: MD, RD, PT, OT, SLP, RN, CM, TR, Pharmacy, Neuropsych and SW  Financial Services: Other: UNINSURED-APPLIED FOR MEDICAID  Choices offered to/list presented to:YES  Follow-up services arranged: DME: [facility], 3 IN 1 AND TUB SEAT and Patient/Family has no preference for HH/DME agenciesHOME EXERCISE PROGRAM DUE TO HOME HEALTH WOULD NOT ACCEPT, DUE TO CHARITY. MATCH FOR ASSISTANCE WITH PRESCRIPTIONS-TOC PHARMACY TO FILL PRIOR TO DC.  Comments (or additional information):CONNECTED [facility] 4/28 @ 2:00 PM. ALSO SET TO GO TO ID CLINIC. APPLIED FOR MEDICAID AND DISABILITY, WILL APPLY FOR FOOD STAMPS AND HOUSING WHEN GETS HOME. OFFERED COUNSELING RESOURCES AND PT DECLINED.  Patient/Family verbalized understanding of follow-up arrangements: Yes  Individual responsible for coordination of the follow-up plan: [name] [phone]  Confirmed correct DME delivered: Dupree, Rebecca G 08/31/2020    Dupree, Rebecca G 

## 2020-08-31 NOTE — Progress Notes (Signed)
[place] PHYSICAL MEDICINE & REHABILITATION PROGRESS NOTE  Subjective/Complaints: Patient seen sitting up in his chair, gathering his belongings.  He states he slept well overnight.  He states he is ready for discharge.  He notes improvement in strength.  ROS: Denies CP, SOB, N/V/D  Objective: Vital Signs: Blood pressure 117/73, pulse 78, temperature 98 F (36.7 C), resp. rate 16, height 5\' 10"  (1.778 m), weight 77.9 kg, SpO2 100 %. No results found. Recent Labs    08/30/20 0502  WBC 4.6  HGB 8.9*  HCT 26.3*  PLT 483*   Recent Labs    08/30/20 0502  NA 138  K 3.9  CL 105  CO2 26  GLUCOSE 88  BUN 8  CREATININE 0.79  CALCIUM 9.6    Intake/Output Summary (Last 24 hours) at 08/31/2020 1303 Last data filed at 08/31/2020 0700 Gross per 24 hour  Intake 480 ml  Output 1200 ml  Net -720 ml        Physical Exam: BP 117/73   Pulse 78   Temp 98 F (36.7 C)   Resp 16   Ht 5\' 10"  (1.778 m)   Wt 77.9 kg   SpO2 100%   BMI 24.64 kg/m   Constitutional: No distress . Vital signs reviewed. HENT: Normocephalic.  Atraumatic. Eyes: EOMI. No discharge. Cardiovascular: No JVD.  RRR. Respiratory: Normal effort.  No stridor.  Bilateral clear to auscultation. GI: Non-distended.  BS +. Skin: Warm and dry.  Intact. Psych: Normal mood.  Normal behavior. Musc: No edema in extremities.  No tenderness in extremities. Neuro: Alert Motor:  Bilateral UE: 4-4+/5 proximal to distal Bilateral lower extremities: Hip flexion, knee extension 4/5, ankle dorsiflexion 2-/5, slight improvement Sensation diminished to light touch bilateral feet  Assessment/Plan: 1. Functional deficits which require 3+ hours per day of interdisciplinary therapy in a comprehensive inpatient rehab setting.  Physiatrist is providing close team supervision and 24 hour management of active medical problems listed below.  Physiatrist and rehab team continue to assess barriers to discharge/monitor patient progress  toward functional and medical goals   Care Tool:  Bathing    Body parts bathed by patient: Right arm,Left arm,Front perineal area,Chest,Abdomen,Right upper leg,Left upper leg,Right lower leg,Left lower leg,Face,Buttocks   Body parts bathed by helper: Buttocks     Bathing assist Assist Level: Independent with assistive device     Upper Body Dressing/Undressing Upper body dressing   What is the patient wearing?: Pull over shirt    Upper body assist Assist Level: Independent    Lower Body Dressing/Undressing Lower body dressing      What is the patient wearing?: Underwear/pull up,Pants     Lower body assist Assist for lower body dressing: Independent with assitive device     Toileting Toileting    Toileting assist Assist for toileting: Independent with assistive device     Transfers Chair/bed transfer  Transfers assist     Chair/bed transfer assist level: Supervision/Verbal cueing     Locomotion Ambulation   Ambulation assist      Assist level: Supervision/Verbal cueing Assistive device: Walker-rolling (bilat AFO's) Max distance: 200'+   Walk 10 feet activity   Assist     Assist level: Supervision/Verbal cueing Assistive device: Walker-rolling,Orthosis   Walk 50 feet activity   Assist    Assist level: Supervision/Verbal cueing Assistive device: Walker-rolling,Orthosis    Walk 150 feet activity   Assist Walk 150 feet activity did not occur: Safety/medical concerns  Assist level: Supervision/Verbal cueing Assistive device: Walker-rolling,Orthosis  Walk 10 feet on uneven surface  activity   Assist Walk 10 feet on uneven surfaces activity did not occur: Safety/medical concerns   Assist level: Contact Guard/Touching assist Assistive device: Walker-rolling,Orthosis   Wheelchair     Assist Will patient use wheelchair at discharge?: No Type of Wheelchair: Manual    Wheelchair assist level: Supervision/Verbal cueing Max  wheelchair distance: 200'    Wheelchair 50 feet with 2 turns activity    Assist        Assist Level: Supervision/Verbal cueing   Wheelchair 150 feet activity     Assist  Wheelchair 150 feet activity did not occur: Safety/medical concerns   Assist Level: Supervision/Verbal cueing   Medical Problem List and Plan: 1.  LE weakness and B/L foot drop secondary to Presumed Guillain Barre syndrome in setting of HIV/AIDs as well as Etoh abuse  DC today  Will see patient for hospital follow-up in 1 month post-discharge  B/l PRAFOs qhs, tolerating  B/l AFOs, fitting appropriately 2.  Antithrombotics: -DVT/anticoagulation:  Pharmaceutical: D/ced Lovenox given ambulation             -antiplatelet therapy: N/A 3. Pain Management: Gabapentin tid 4. Mood: Team to provide ego support.              -antipsychotic agents: N/A 5. Neuropsych: This patient is not fully capable of making decisions on his own behalf. 6. Skin/Wound Care: Routine pressure relief measures. 7. Fluids/Electrolytes/Nutrition: Monitor I/Os 8. HTN: Monitor BP tid--continue Norvasc and coreg bid.   Controlled on 3/24  Monitor with increased mobility 9. HIV/AIDS: Now on Bitarvy (has been approved for assistance) with Bactrim daily for prophylaxis.  -discussed T cell counts with patient and his father.              --Imodium prn for diarrhea 10. ETOH abuse with folate/thiamine deficiency:  Completed IV doses of folic acid/Thiamine              On folic acid ane thiamine for supplement.   Counsel 11. Hypomagnesemia:              Received 2 gram IV doses--03/04, 03/06 and 03/8, 4 g on 3/10, 3/14.              Now on daily po supplement, changed on 3/9, increased on 3/15  Increased po supplement to TID, provided list of magnesium rich foods, avoid alcohol at home.    Magnesium 1.6 on 3/23, continue to monitor in outpatient setting  PPI DC'd on 3/21 12. GAD/depression: On Zoloft 100 mg bid with low dose ativan bid  prn.  13. B/L foot drops: PRAFOs and AFOs  14 Encephalopathy-resolved. 15.  Hyponatremia  Sodium 138 on 3/23  Continue to monitor 16.  Transaminitis: Resolved  Continue to monitor 17.  Sinus tachycardia  Controlled on 3/24 18. Anemia:   Hb 8.9 on 3/23  Continue to monitor 19.  Leukopenia-likely related to medications  WBC 4.6 on 3/23 20. GERD  See #11  May consider q48 hour dosing as patient states he can go 2-3 days without medication before symptoms reoccur, continue to monitor ambulatory setting  > 30 minutes spent in total in discharge planning between myself and PA regarding aforementioned, as well discussion regarding DME equipment, follow-up appointments, follow-up therapies, discharge medications, discharge recommendations, answering questions.  Please see discharge summary as well.  LOS: 16 days A FACE TO FACE EVALUATION WAS PERFORMED  Harold Flores Harold Flores 08/31/2020, 1:03 PM

## 2020-08-31 NOTE — Progress Notes (Signed)
Pt discharged to home with family and personal belongings. Discharge instructions provided to pt/family by P. Love, PA-c. Pt verbalized an understanding of medications, discharge instructions as well as follow up appts. TCT provided medications for home.

## 2020-08-31 NOTE — Progress Notes (Signed)
Recreational Therapy Discharge Summary Patient Details  Name: Harold Flores MRN: [id number] Date of Birth: 07/25/1990 Today's Date: 08/31/2020  Long term goals set: 2  Long term goals met: 2  Comments on progress toward goals: Pt has made excellent progress during LOS and is ready for discharge home with mother at supervision level.  TR sessions focused on activity analysis identifying potential adaptations, home safety including pet care, coping & relaxation strategies and community reintegration.  Pt is contact guard assist ambulatory level for community mobility. Reasons for discharge: discharge from hospital   Patient/family agrees with progress made and goals achieved: Yes  [name] 08/31/2020, 9:03 AM

## 2020-08-31 NOTE — Discharge Instructions (Signed)
Inpatient Rehab Discharge Instructions  ICARUS PARTCH Discharge date and time: 08/31/20   Activities/Precautions/ Functional Status: Activity: no lifting, driving, or strenuous exercise till cleared by MD Diet: regular diet Wound Care: none needed    Functional status:  ___ No restrictions     ___ Walk up steps independently ___ 24/7 supervision/assistance   ___ Walk up steps with assistance _X__ Intermittent supervision/assistance  ___ Bathe/dress independently ___ Walk with walker     _X__ Bathe/dress with assistance ___ Walk Independently    ___ Shower independently ___ Walk with assistance    ___ Shower with assistance _X__ No alcohol     ___ Return to work/school ________   Special Instructions:   COMMUNITY REFERRALS UPON DISCHARGE:   HOME EXERCISE PROGRAM GIVEN TO PATIENT DUE TO HOME HEALTH WOULD NOT ACCEPT UNDER CHARITY  Medical Equipment/Items Ordered:ROLLING WALKER, TUB SEAT AND 3 IN 1                                                 Agency/Supplier:[facility]  [phone]  MATCH-PRESCRIPTIONS THROUGH [facility] FILLED HERE.   My questions have been answered and I understand these instructions. I will adhere to these goals and the provided educational materials after my discharge from the hospital.  Patient/Caregiver Signature _______________________________ Date __________  Clinician Signature _______________________________________ Date __________  Please bring this form and your medication list with you to all your follow-up doctor's appointments.

## 2020-09-01 NOTE — Discharge Summary (Addendum)
Physician Discharge Summary  Patient ID: Harold Flores MRN: [id number] DOB/AGE: 06-Jan-1991 30 y.o.  Admit date: 08/15/2020 Discharge date: 09/01/2020  Discharge Diagnoses:  Principal Problem:   Progressive focal motor weakness Active Problems:   Transaminitis   ETOH abuse   Sinus tachycardia   Essential hypertension   AIDS (HCC)   Anemia   Leukopenia   GBS (Guillain Barre syndrome) (HCC)   Gastroesophageal reflux disease   Discharged Condition: stable    Significant Diagnostic Studies: N/A   Labs:  Basic Metabolic Panel: Recent Labs  Lab 08/28/20 0629 08/30/20 0502 08/30/20 1038  NA 137 138  --   K 3.6 3.9  --   CL 104 105  --   CO2 25 26  --   GLUCOSE 88 88  --   BUN 6 8  --   CREATININE 0.62 0.79  --   CALCIUM 9.3 9.6  --   MG 1.5*  --  1.6*    CBC: Recent Labs  Lab 08/30/20 0502  WBC 4.6  NEUTROABS 1.8  HGB 8.9*  HCT 26.3*  MCV 94.6  PLT 483*    CBG: No results for input(s): GLUCAP in the last 168 hours.  Brief HPI:   Harold Flores is a 30 y.o. male with history of EtOH abuse, syphilis, HIV/AIDS who was admitted on 08/06/2020 with BLE weakness and difficulty walking, paresthesias, decreased appetite and loose stools as well as confusion with hallucinations.  He was found to have significant hyperkalemia with elevated troponins felt to be multifactorial due to LVH, tachycardia and anemia.  Work-up was suspicious for GBS and was treated with IVIG.  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  ID expressed concerns of advanced HIV AIDS with viral load of 104,000 and CD4 of 40 therefore Biktarvy added for treatment.  He did have improvement in lower extremity strength but continued to have instability with ataxia, balance deficits as well as waxing and waning of mentation.   CIR was recommended due to functional decline.Marland Kitchen   Hospital Course: Harold Flores was admitted to rehab 08/15/2020 for inpatient therapies to consist of PT and OT at least three hours five days a week. Past admission physiatrist, therapy team and rehab RN have worked together to provide customized collaborative inpatient rehab. Blood pressures were monitored on TID basis and have been well controlled on current regimen. He continues on Bitarvy and is tolerating this without SE. Mentation has improved and no signs of anxiety noted during his stay.  P.o. intake has improved and diarrhea has resolved.  Follow up magnesium levels remain low and he was supplemented with IV doses and was started on oral supplement during his stay. Anticipate that it will continue to improve with improvement in intake and abstinence from alcohol.    Team has provided ego support and his mood has been stable on Zoloft alone.  He has been educated on importance of medication compliance as well as avoidance of alcohol and marijuana.  He continues on folic acid and thiamine for supplementation.  Follow-up CBC showed slow improvement in white count and H&H.  Check of BMET showed electrolytes and renal status to be within normal limits.  Bilateral peripherals were used at nights and he was fitted with bilateral AFO for foot drop to help with gait quality.  His spasticity has  improved and pain is currently managed on gabapentin 3 times daily.  He has made steady progress during his stay and is currently at supervision level.  He has been educated on home exercise program as no home health available to provide charity care.   Rehab course: During patient's stay in rehab weekly team conferences were held to monitor patient's progress, set goals and discuss barriers to discharge. At admission, patient required mod assist with ADL tasks and with mobility. Speech therapy evaluation revealed cognitive linguistic skills to be WNL therefore  not needed during his stay.  He has had improvement in activity tolerance, balance, postural control as well as ability to compensate for deficits. He is able to complete ADL tasks with supervision.  He is independent for transfers and requires supervision to ambulate 300' with RW and cues. Family education was completed.   Disposition: Home  Diet: Regular.   Special Instructions: 1.  Repeat BMET and magnesium level in couple of weeks.   Discharge Instructions     Ambulatory referral to Physical Medicine Rehab   Complete by: As directed    3-4 weeks f/u appt      Allergies as of 08/31/2020   No Known Allergies      Medication List     STOP taking these medications    Gerhardt's butt cream Crea   LORazepam 0.5 MG tablet Commonly known as: ATIVAN   magnesium oxide 400 (241.3 Mg) MG tablet Commonly known as: MAG-OX   pantoprazole 40 MG tablet Commonly known as: PROTONIX       TAKE these medications    amLODipine 10 MG tablet Commonly known as: NORVASC Take 1 tablet (10 mg total) by mouth daily.   bictegravir-emtricitabine-tenofovir AF 50-200-25 MG Tabs tablet Commonly known as: BIKTARVY Take 1 tablet by mouth daily.   carvedilol 12.5 MG tablet Commonly known as: COREG 1 tab by mouth twice per day   folic acid 1 MG tablet Commonly known as: FOLVITE Take 1 tablet (1 mg total) by mouth daily.   furosemide 20 MG tablet Commonly known as: LASIX Take 1 tablet (20 mg total) by mouth daily as needed for fluid or edema. 1 tab by mouth in the AM. What changed: additional instructions   gabapentin 400 MG capsule Commonly known as: NEURONTIN Take 1 capsule (400 mg total) by mouth 3 (three) times daily.   loperamide 2 MG capsule Commonly known as: IMODIUM Take 1 capsule (2 mg total) by mouth as needed for diarrhea or loose stools.   magnesium gluconate 500 MG tablet Commonly known as: MAGONATE Take 1 tablet (500 mg total) by mouth 3 (three) times daily.    multivitamin tablet Take 1 tablet by mouth daily.   nicotine polacrilex 2 MG gum Commonly known as: NICORETTE Take 1 each (2 mg total) by mouth as needed for smoking cessation.   sertraline 100 MG tablet Commonly known as: ZOLOFT Take 1 tablet (100 mg total) by mouth daily.   sulfamethoxazole-trimethoprim 800-160 MG tablet Commonly known as: BACTRIM DS Take 1 tablet by mouth daily. Notes to patient: To prevent PCP pneumonia   thiamine 100 MG tablet Take 1 tablet (100 mg total) by mouth daily. Notes to patient: B1--to supplement your brain (alcohol use)        Follow-up Information     Anders Simmonds, PA-C Follow up on 10/05/2020.   Specialty: Family Medicine Why: Appointment @ 2:00 PM Contact information: [address] [place] [address] [phone]  Daiva Eves, Lisette Grinder, MD Follow up on 09/14/2020.   Specialty: Infectious Diseases Why: Appointment at 11 am Contact information: [address] [place] [address] [phone]         Marcello Fennel, MD Follow up.   Specialty: Physical Medicine and Rehabilitation Why: Office will call you with follow up appt Contact information: [address] [place] [address] [phone]                 Signed: Jacquelynn Cree 09/01/2020, 3:23 PM Patient was seen, face-face, and physical exam performed by me on day of discharge, greater than 30 minutes of total time spent.. Please see progress note from day of discharge as well.  Maryla Morrow, MD, ABPMR

## 2020-09-12 ENCOUNTER — Other Ambulatory Visit: Payer: Self-pay

## 2020-09-12 ENCOUNTER — Encounter: Payer: Medicaid Other | Attending: Registered Nurse | Admitting: Registered Nurse

## 2020-09-12 VITALS — BP 123/88 | HR 105 | Temp 99.1°F | Ht 70.0 in | Wt 163.0 lb

## 2020-09-12 DIAGNOSIS — G610 Guillain-Barre syndrome: Secondary | ICD-10-CM | POA: Insufficient documentation

## 2020-09-12 DIAGNOSIS — B20 Human immunodeficiency virus [HIV] disease: Secondary | ICD-10-CM | POA: Insufficient documentation

## 2020-09-12 DIAGNOSIS — I10 Essential (primary) hypertension: Secondary | ICD-10-CM | POA: Insufficient documentation

## 2020-09-12 DIAGNOSIS — F1010 Alcohol abuse, uncomplicated: Secondary | ICD-10-CM | POA: Diagnosis not present

## 2020-09-12 MED ORDER — CARVEDILOL 12.5 MG PO TABS
ORAL_TABLET | ORAL | 0 refills | Status: DC
  Filled 2020-09-12: qty 60, 30d supply, fill #0

## 2020-09-12 MED ORDER — MAGNESIUM OXIDE 400 MG PO TABS
ORAL_TABLET | ORAL | 0 refills | Status: DC
  Filled 2020-09-12 – 2020-10-02 (×3): qty 90, 30d supply, fill #0

## 2020-09-12 MED ORDER — FOLIC ACID 1 MG PO TABS
ORAL_TABLET | Freq: Every day | ORAL | 0 refills | Status: DC
  Filled 2020-09-12 – 2020-10-02 (×3): qty 30, 30d supply, fill #0

## 2020-09-12 MED ORDER — AMLODIPINE BESYLATE 10 MG PO TABS
1.0000 | ORAL_TABLET | Freq: Every day | ORAL | 0 refills | Status: DC
Start: 2020-09-12 — End: 2020-09-14
  Filled 2020-09-12: qty 30, 30d supply, fill #0

## 2020-09-12 MED ORDER — THIAMINE HCL 100 MG PO TABS
ORAL_TABLET | Freq: Every day | ORAL | 0 refills | Status: DC
  Filled 2020-09-12: qty 30, fill #0

## 2020-09-12 NOTE — Progress Notes (Signed)
Subjective:    Patient ID: Harold Flores, male    DOB: [birth year], 30 y.o.   MRN: [id number]  HPI: Harold Flores is a 30 y.o. male who is here for hospital follow up of his Guillain Barre Syndrome, AIDS, Essential Hypertension and ETOH Abuse. He was admitted to [facility] on 08/03/2020 he came to emergency room with complaints of generalized weakness  and fatigued. ID Consulted. CT Angio:  IMPRESSION: 1. No evidence of acute pulmonary embolism. 2. Few ground-glass nodular opacities in the right upper lobe, which may reflect an infectious/inflammatory process.  MR Brain:  IMPRESSION: No acute or significant abnormality.  [name] was admitted to inpatient rehabilitation on 08/15/2020 and discharged home on 09/01/2020. He was given HEP. He is walking with walker. He states his neuropathic pain has increased in intensity in his bilateral hands and bilateral feet, he describes his pain as tingling.We will increase his gabapentin to 4 times a day ; He was instructed to send a My- Chart message in a week to evaluate medication change, he verbalizes understanding.  He rates his pain today 0.   Mother in room, all questions answered.    Pain Inventory Average Pain 3 Pain Right Now 0 My pain is dull  In the last 24 hours, has pain interfered with the following? General activity 7 Relation with others 7 Enjoyment of life 5 What TIME of day is your pain at its worst? morning  Sleep (in general) Fair  Pain is worse with: walking Pain improves with: pacing activities Relief from Meds: 7  walk with assistance use a walker how many minutes can you walk? 15 ability to climb steps?  yes do you drive?  no  not employed: date last employed . I need assistance with the following:  meal prep and household duties  weakness tingling trouble walking  TC appt  TC appt    Family History  Problem Relation Age of Onset  . Diabetes Mother   . Hyperlipidemia  Mother   . Diabetes Maternal Grandmother   . Heart disease Paternal Grandfather   . Cancer Neg Hx    Social History   Socioeconomic History  . Marital status: Single    Spouse name: Not on file  . Number of children: 0  . Years of education: 46  . Highest education level: Not on file  Occupational History  . Occupation: [employer]  Tobacco Use  . Smoking status: Current Every Day Smoker    Packs/day: 0.50    Years: 5.00    Pack years: 2.50    Types: E-cigarettes, Cigarettes  . Smokeless tobacco: Never Used  Substance and Sexual Activity  . Alcohol use: Yes    Alcohol/week: 7.0 standard drinks    Types: 7 Cans of beer per week    Comment: Rarely  . Drug use: Yes    Types: Marijuana  . Sexual activity: Yes    Partners: Male    Birth control/protection: Condom    Comment: pt. declined condoms  Other Topics Concern  . Not on file  Social History Narrative   Lives at home   A student   architectural games on computer   Fun: Music   Denies religious beliefs that would effect health care.    Social Determinants of Health   Financial Resource Strain: Not on file  Food Insecurity: Not on file  Transportation Needs: Not on file  Physical Activity: Not on file  Stress: Not on file  Social Connections: Not on file   Past Surgical History:  Procedure Laterality Date  . EYE SURGERY  1994   blephroplasty right eye  . RADIOLOGY WITH ANESTHESIA N/A 08/08/2020   Procedure: MRI WITH ANESTHESIA- BRAIN WITH AND WITHOUT CONTRAST,  CERVICAL SPINE WITH AND WITHOUT CONTRAST, LUMBAR SPINE WITH WITHOUT CONTRAST, THORACIC SPINE WITH WITHOUT CONTRAST;  Surgeon: Radiologist, Medication, MD;  Location: [facility] OR;  Service: Radiology;  Laterality: N/A;   Past Medical History:  Diagnosis Date  . Anal fissure   . Anal fistula   . Anal ulcer   . Asthma   . Epistaxis   . GAD (generalized anxiety disorder)   . GERD (gastroesophageal reflux disease)   . Hemorrhoid   . HIV (human  immunodeficiency virus infection) (HCC)   . Hypertension   . Syphilis   . Varicella    BP 123/88   Pulse (!) 105   Temp 99.1 F (37.3 C)   Ht 5\' 10"  (1.778 m)   Wt 163 lb (73.9 kg)   SpO2 98%   BMI 23.39 kg/m   Opioid Risk Score:   Fall Risk Score:  `1  Depression screen PHQ 2/9  Depression screen [facility] 2/9 09/12/2020 09/09/2019 09/09/2019 05/07/2017 06/18/2016 05/01/2015 02/22/2015  Decreased Interest 1 0 0 0 0 0 0  Down, Depressed, Hopeless 1 1 1  0 0 0 0  PHQ - 2 Score 2 1 1  0 0 0 0  Altered sleeping 1 1 - - - - 0  Tired, decreased energy 0 1 - - - - 0  Change in appetite 0 1 - - - - 1  Feeling bad or failure about yourself  0 0 - - - - 0  Trouble concentrating 0 0 - - - - 0  Moving slowly or fidgety/restless 0 0 - - - - 0  Suicidal thoughts 0 0 - - - - 0  PHQ-9 Score 3 4 - - - - 1  Difficult doing work/chores Very difficult Not difficult at all - - - - Not difficult at all    Review of Systems  Musculoskeletal: Positive for gait problem.       Hand pain Feet pain  Neurological: Positive for weakness.  All other systems reviewed and are negative.      Objective:   Physical Exam Vitals and nursing note reviewed.  Constitutional:      Appearance: Normal appearance.  Cardiovascular:     Rate and Rhythm: Normal rate and regular rhythm.     Pulses: Normal pulses.     Heart sounds: Normal heart sounds.  Pulmonary:     Effort: Pulmonary effort is normal.     Breath sounds: Normal breath sounds.  Musculoskeletal:     Cervical back: Normal range of motion and neck supple.     Comments: Normal Muscle Bulk and Muscle Testing Reveals:  Upper Extremities: Full ROM and Muscle Strength 5/5 Lower Extremities: Full ROM and Muscle Strength 5/5 Wearing Bilateral AFO's  Arises from Table slowly using walker for support Antalgic  Gait   Skin:    General: Skin is warm and dry.  Neurological:     Mental Status: He is alert and oriented to person, place, and time.  Psychiatric:         Mood and Affect: Mood normal.        Behavior: Behavior normal.           Assessment & Plan:  1. Guillain Barre Syndrome: Continue current medication regimen.  Continue HEP as tolerated. He has a scheduled appointment with [facility] on 10/05/2020. 2.AIDS: He has a scheduled appointment with ID. On 09/14/2020. Continue to monitor.  3. Essential Hypertension: Continue current medication regimen. He has a scheduled appointment with [facility] on 10/05/2020.  4. ETOH Abuse.Continue with cessation of ETOH. Continue to Monitor.   F/U with Dr Allena Katz in 4- 6 weeks

## 2020-09-12 NOTE — Patient Instructions (Signed)
Increase Gabapentin to 4 times a day  For the Nerve Pain:   Send a My-Chart Message in a week to evaluate medication change.

## 2020-09-13 ENCOUNTER — Encounter: Payer: Self-pay | Admitting: Registered Nurse

## 2020-09-14 ENCOUNTER — Other Ambulatory Visit: Payer: Self-pay

## 2020-09-14 ENCOUNTER — Ambulatory Visit (INDEPENDENT_AMBULATORY_CARE_PROVIDER_SITE_OTHER): Payer: Self-pay | Admitting: Infectious Disease

## 2020-09-14 VITALS — BP 118/81 | HR 93 | Temp 98.4°F | Wt 162.0 lb

## 2020-09-14 DIAGNOSIS — G9340 Encephalopathy, unspecified: Secondary | ICD-10-CM

## 2020-09-14 DIAGNOSIS — B20 Human immunodeficiency virus [HIV] disease: Secondary | ICD-10-CM

## 2020-09-14 DIAGNOSIS — F1010 Alcohol abuse, uncomplicated: Secondary | ICD-10-CM

## 2020-09-14 DIAGNOSIS — I10 Essential (primary) hypertension: Secondary | ICD-10-CM

## 2020-09-14 DIAGNOSIS — R269 Unspecified abnormalities of gait and mobility: Secondary | ICD-10-CM

## 2020-09-14 DIAGNOSIS — G610 Guillain-Barre syndrome: Secondary | ICD-10-CM

## 2020-09-14 DIAGNOSIS — R29898 Other symptoms and signs involving the musculoskeletal system: Secondary | ICD-10-CM

## 2020-09-14 DIAGNOSIS — F411 Generalized anxiety disorder: Secondary | ICD-10-CM

## 2020-09-14 MED ORDER — GABAPENTIN 400 MG PO CAPS: ORAL_CAPSULE | ORAL | 5 refills | Status: DC

## 2020-09-14 MED ORDER — BICTEGRAVIR-EMTRICITAB-TENOFOV 50-200-25 MG PO TABS: 1.0000 | ORAL_TABLET | Freq: Every day | ORAL | 11 refills | Status: DC

## 2020-09-14 MED ORDER — SERTRALINE HCL 100 MG PO TABS: 1.0000 | ORAL_TABLET | Freq: Every day | ORAL | 11 refills | Status: DC

## 2020-09-14 MED ORDER — CARVEDILOL 12.5 MG PO TABS
ORAL_TABLET | ORAL | 11 refills | Status: DC
  Filled 2020-09-25 – 2020-10-02 (×2): qty 60, 30d supply, fill #0

## 2020-09-14 MED ORDER — AMLODIPINE BESYLATE 10 MG PO TABS
1.0000 | ORAL_TABLET | Freq: Every day | ORAL | 11 refills | Status: DC
Start: 2020-09-14 — End: 2021-11-26

## 2020-09-14 MED ORDER — SULFAMETHOXAZOLE-TRIMETHOPRIM 800-160 MG PO TABS
1.0000 | ORAL_TABLET | Freq: Every day | ORAL | 5 refills | Status: DC
Start: 2020-09-14 — End: 2021-03-28

## 2020-09-14 NOTE — Progress Notes (Signed)
Subjective:   Chief complaint: followup for HIV disease, GB, alcoholism   Patient ID: Harold Flores, male    DOB: 1990/06/17, 30 y.o.   MRN: [id number]  HPI  30 year old Black man living with HIV/AIDS who had been out of care and presented with loss of strength in his lower extremities gait instability numbness in his hands encephalopathy with waxing and waning neurological status.  There is initially concern for possible opportunistic infection and he underwent lumbar puncture which did not reveal an opportunistic infection.  He was treated for Guillain-Barr syndrome with IVIG and corticosteroids.  He was also found to be deficient of folate and B12 in the context of severe alcoholism.  He was treated with replenishment of these vitamins.  He was restarted on antiretroviral therapy in the form of Biktarvy.  His initial viral load was greater than 100,000 copies.  CD4 count was only 40 initially but rose to over 100 while he was in the hospital he ultimately improved sufficiently to be transferred to inpatient rehab where he stayed until last week when he was discharged in the hospital.  He comes to clinic today accompanied by his mother.  He looks dramatically improved compared to when he was in the hospital.  He still using a walker he has stopped alcohol altogether as well as tobacco and marijuana.  He is agreeable to seeing Marylu Lund today.  He has follow-up with rehab and with primary care with [facility].    Past Medical History:  Diagnosis Date  . Anal fissure   . Anal fistula   . Anal ulcer   . Asthma   . Epistaxis   . GAD (generalized anxiety disorder)   . GERD (gastroesophageal reflux disease)   . Hemorrhoid   . HIV (human immunodeficiency virus infection) (HCC)   . Hypertension   . Syphilis   . Varicella     Past Surgical History:  Procedure Laterality Date  . EYE SURGERY  1994   blephroplasty right eye  . RADIOLOGY WITH ANESTHESIA N/A 08/08/2020    Procedure: MRI WITH ANESTHESIA- BRAIN WITH AND WITHOUT CONTRAST,  CERVICAL SPINE WITH AND WITHOUT CONTRAST, LUMBAR SPINE WITH WITHOUT CONTRAST, THORACIC SPINE WITH WITHOUT CONTRAST;  Surgeon: Radiologist, Medication, MD;  Location: [facility] OR;  Service: Radiology;  Laterality: N/A;    Family History  Problem Relation Age of Onset  . Diabetes Mother   . Hyperlipidemia Mother   . Diabetes Maternal Grandmother   . Heart disease Paternal Grandfather   . Cancer Neg Hx       Social History   Socioeconomic History  . Marital status: Single    Spouse name: Not on file  . Number of children: 0  . Years of education: 28  . Highest education level: Not on file  Occupational History  . Occupation: [employer]  Tobacco Use  . Smoking status: Current Every Day Smoker    Packs/day: 0.50    Years: 5.00    Pack years: 2.50    Types: E-cigarettes, Cigarettes  . Smokeless tobacco: Never Used  Substance and Sexual Activity  . Alcohol use: Yes    Alcohol/week: 7.0 standard drinks    Types: 7 Cans of beer per week    Comment: Rarely  . Drug use: Yes    Types: Marijuana  . Sexual activity: Yes    Partners: Male    Birth control/protection: Condom    Comment: pt. declined condoms  Other Topics Concern  .  Not on file  Social History Narrative   Lives at home   A student   architectural games on computer   Fun: Music   Denies religious beliefs that would effect health care.    Social Determinants of Health   Financial Resource Strain: Not on file  Food Insecurity: Not on file  Transportation Needs: Not on file  Physical Activity: Not on file  Stress: Not on file  Social Connections: Not on file    No Known Allergies   Current Outpatient Medications:  .  amLODipine (NORVASC) 10 MG tablet, TAKE 1 TABLET (10 MG TOTAL) BY MOUTH DAILY., Disp: 30 tablet, Rfl: 0 .  bictegravir-emtricitabine-tenofovir AF (BIKTARVY) 50-200-25 MG TABS tablet, TAKE 1 TABLET BY MOUTH DAILY., Disp: 30 tablet,  Rfl: 0 .  carvedilol (COREG) 12.5 MG tablet, TAKE 1 TABLET BY MOUTH TWICE PER DAY, Disp: 60 tablet, Rfl: 0 .  folic acid (FOLVITE) 1 MG tablet, TAKE 1 TABLET (1 MG TOTAL) BY MOUTH DAILY., Disp: 30 tablet, Rfl: 0 .  furosemide (LASIX) 20 MG tablet, TAKE 1 TABLET (20 MG TOTAL) BY MOUTH DAILY IN THE MORNING AS NEEDED FOR FLUID OR EDEMA., Disp: 30 tablet, Rfl: 0 .  gabapentin (NEURONTIN) 400 MG capsule, TAKE 1 CAPSULE (400 MG TOTAL) BY MOUTH THREE TIMES DAILY., Disp: 90 capsule, Rfl: 0 .  loperamide (IMODIUM) 2 MG capsule, TAKE 1 CAPSULE (2 MG TOTAL) BY MOUTH AS NEEDED FOR DIARRHEA OR LOOSE STOOLS., Disp: 30 capsule, Rfl: 0 .  magnesium oxide (MAG-OX) 400 MG tablet, TAKE 1 TABLET (500 MG TOTAL) BY MOUTH THREE TIMES DAILY., Disp: 90 tablet, Rfl: 0 .  Multiple Vitamin (MULTIVITAMIN) tablet, Take 1 tablet by mouth daily., Disp: , Rfl:  .  nicotine polacrilex (NICORETTE) 2 MG gum, TAKE 1 EACH (2 MG TOTAL) BY MOUTH AS NEEDED FOR SMOKING CESSATION., Disp: 100 each, Rfl: 0 .  sertraline (ZOLOFT) 100 MG tablet, TAKE 1 TABLET (100 MG TOTAL) BY MOUTH DAILY., Disp: 30 tablet, Rfl: 1 .  sulfamethoxazole-trimethoprim (BACTRIM DS) 800-160 MG tablet, TAKE 1 TABLET BY MOUTH DAILY., Disp: 30 tablet, Rfl: 0 .  thiamine 100 MG tablet, TAKE 1 TABLET (100 MG TOTAL) BY MOUTH DAILY., Disp: 30 tablet, Rfl: 0   Review of Systems  Constitutional: Negative for activity change, appetite change, chills, diaphoresis, fatigue, fever and unexpected weight change.  HENT: Negative for congestion, rhinorrhea, sinus pressure, sneezing, sore throat and trouble swallowing.   Eyes: Negative for photophobia and visual disturbance.  Respiratory: Negative for cough, chest tightness, shortness of breath, wheezing and stridor.   Cardiovascular: Negative for chest pain, palpitations and leg swelling.  Gastrointestinal: Negative for abdominal distention, abdominal pain, anal bleeding, blood in stool, constipation, diarrhea, nausea and vomiting.   Genitourinary: Negative for difficulty urinating, dysuria, flank pain and hematuria.  Musculoskeletal: Positive for gait problem. Negative for arthralgias, back pain, joint swelling and myalgias.  Skin: Negative for color change, pallor, rash and wound.  Neurological: Negative for dizziness, tremors, weakness and light-headedness.  Hematological: Negative for adenopathy. Does not bruise/bleed easily.  Psychiatric/Behavioral: Negative for agitation, behavioral problems, confusion, decreased concentration, dysphoric mood and sleep disturbance.       Objective:   Physical Exam Constitutional:      Appearance: He is well-developed.  HENT:     Head: Normocephalic and atraumatic.  Eyes:     Conjunctiva/sclera: Conjunctivae normal.  Cardiovascular:     Rate and Rhythm: Normal rate and regular rhythm.  Pulmonary:     Effort: Pulmonary  effort is normal. No respiratory distress.     Breath sounds: No wheezing.  Abdominal:     General: There is no distension.     Palpations: Abdomen is soft.  Musculoskeletal:        General: No tenderness. Normal range of motion.     Cervical back: Normal range of motion and neck supple.  Skin:    General: Skin is warm and dry.     Coloration: Skin is not pale.     Findings: No erythema or rash.  Neurological:     Mental Status: He is alert and oriented to person, place, and time.  Psychiatric:        Mood and Affect: Mood normal.        Behavior: Behavior normal.        Thought Content: Thought content normal.        Judgment: Judgment normal.           Assessment & Plan:  HIV and AIDS,: Continue Biktarvy checking labs today continue PCP prophylaxis.  Possible Guillain-Barr syndrome: Status post treatment steroids and IVIG new follow-up with rehab  Alcoholism: He is now sober and committed to staying sober.  He said today to me that he has an addictive personality cannot have any Margo Aye or other recreational drugs which he wants thought that  he could partake of.  Smoking stop smoking.  Hypertension continue his beta-blocker ACE inhibitor.  I have sent scripts that are covered by the HIV medication assistance program to the mail order pharmacy with [facility] in [place].  Folate deficiency continue folate filled through [facility].  I spent greater than 40 minutes with the patient including greater than 50% of time in face to face counsel of the patient and his wife re nature of HIV, alcoholism, GBS, smoking, HTN  and in coordination of his care.

## 2020-09-15 LAB — URINE CYTOLOGY ANCILLARY ONLY
Chlamydia: NEGATIVE
Comment: NEGATIVE
Comment: NORMAL
Neisseria Gonorrhea: NEGATIVE

## 2020-09-15 LAB — T-HELPER CELL (CD4) - (RCID CLINIC ONLY)
CD4 % Helper T Cell: 10 % — ABNORMAL LOW (ref 33–65)
CD4 T Cell Abs: 194 /uL — ABNORMAL LOW (ref 400–1790)

## 2020-09-17 LAB — CBC WITH DIFFERENTIAL/PLATELET
Absolute Monocytes: 1811 cells/uL — ABNORMAL HIGH (ref 200–950)
Basophils Absolute: 17 cells/uL (ref 0–200)
Basophils Relative: 0.2 %
Eosinophils Absolute: 374 cells/uL (ref 15–500)
Eosinophils Relative: 4.4 %
HCT: 32.6 % — ABNORMAL LOW (ref 38.5–50.0)
Hemoglobin: 10.5 g/dL — ABNORMAL LOW (ref 13.2–17.1)
Lymphs Abs: 1989 cells/uL (ref 850–3900)
MCH: 29.7 pg (ref 27.0–33.0)
MCHC: 32.2 g/dL (ref 32.0–36.0)
MCV: 92.1 fL (ref 80.0–100.0)
MPV: 10.4 fL (ref 7.5–12.5)
Monocytes Relative: 21.3 %
Neutro Abs: 4310 cells/uL (ref 1500–7800)
Neutrophils Relative %: 50.7 %
Platelets: 250 10*3/uL (ref 140–400)
RBC: 3.54 10*6/uL — ABNORMAL LOW (ref 4.20–5.80)
RDW: 13.1 % (ref 11.0–15.0)
Total Lymphocyte: 23.4 %
WBC: 8.5 10*3/uL (ref 3.8–10.8)

## 2020-09-17 LAB — COMPLETE METABOLIC PANEL WITH GFR
AG Ratio: 1.4 (calc) (ref 1.0–2.5)
ALT: 14 U/L (ref 9–46)
AST: 22 U/L (ref 10–40)
Albumin: 4.2 g/dL (ref 3.6–5.1)
Alkaline phosphatase (APISO): 70 U/L (ref 36–130)
BUN: 8 mg/dL (ref 7–25)
CO2: 29 mmol/L (ref 20–32)
Calcium: 9.9 mg/dL (ref 8.6–10.3)
Chloride: 103 mmol/L (ref 98–110)
Creat: 0.76 mg/dL (ref 0.60–1.35)
GFR, Est African American: 143 mL/min/{1.73_m2} (ref >=60)
GFR, Est Non African American: 123 mL/min/{1.73_m2} (ref >=60)
Globulin: 3.1 g/dL (calc) (ref 1.9–3.7)
Glucose, Bld: 85 mg/dL (ref 65–99)
Potassium: 4 mmol/L (ref 3.5–5.3)
Sodium: 139 mmol/L (ref 135–146)
Total Bilirubin: 0.5 mg/dL (ref 0.2–1.2)
Total Protein: 7.3 g/dL (ref 6.1–8.1)

## 2020-09-17 LAB — HIV-1 RNA QUANT-NO REFLEX-BLD
HIV 1 RNA Quant: 37 Copies/mL — ABNORMAL HIGH
HIV-1 RNA Quant, Log: 1.56 Log cps/mL — ABNORMAL HIGH

## 2020-09-20 ENCOUNTER — Other Ambulatory Visit: Payer: Self-pay

## 2020-09-21 ENCOUNTER — Other Ambulatory Visit: Payer: Self-pay

## 2020-09-21 ENCOUNTER — Ambulatory Visit: Payer: Self-pay

## 2020-09-23 LAB — MISC LABCORP TEST (SEND OUT): Labcorp test code: 183510

## 2020-09-25 ENCOUNTER — Other Ambulatory Visit: Payer: Self-pay

## 2020-09-25 ENCOUNTER — Telehealth: Payer: Self-pay | Admitting: *Deleted

## 2020-09-25 MED ORDER — GABAPENTIN 400 MG PO CAPS: 400.0000 mg | ORAL_CAPSULE | Freq: Four times a day (QID) | ORAL | 0 refills | Status: DC

## 2020-09-25 NOTE — Telephone Encounter (Signed)
Harold Flores called and says that he and [name] talked about him increasing to gabapentin 400 mg qid from tid and it has helped him tremendously. He is asking for the increase to go to his [place] pharmacy. I have sent in only one month as he is Dr Eliane Decree patient and has appt 10/24/20. Dr Daiva Eves had sent in it at tid dosing with 5 refills but I can only send 1 refill at this time until he sees Dr Allena Katz.

## 2020-09-26 ENCOUNTER — Other Ambulatory Visit: Payer: Self-pay

## 2020-09-27 ENCOUNTER — Other Ambulatory Visit: Payer: Self-pay

## 2020-09-28 ENCOUNTER — Other Ambulatory Visit: Payer: Self-pay

## 2020-09-28 ENCOUNTER — Ambulatory Visit: Payer: Self-pay

## 2020-09-29 ENCOUNTER — Other Ambulatory Visit (HOSPITAL_COMMUNITY): Payer: Self-pay

## 2020-10-02 ENCOUNTER — Other Ambulatory Visit: Payer: Self-pay

## 2020-10-03 ENCOUNTER — Ambulatory Visit: Payer: Self-pay

## 2020-10-03 ENCOUNTER — Other Ambulatory Visit: Payer: Self-pay

## 2020-10-05 ENCOUNTER — Other Ambulatory Visit: Payer: Self-pay

## 2020-10-05 ENCOUNTER — Encounter: Payer: Self-pay | Admitting: Physician Assistant

## 2020-10-05 ENCOUNTER — Ambulatory Visit: Payer: Medicaid Other | Attending: Physician Assistant | Admitting: Physician Assistant

## 2020-10-05 VITALS — BP 130/88 | HR 104 | Resp 20 | Ht 70.0 in | Wt 170.0 lb

## 2020-10-05 DIAGNOSIS — B20 Human immunodeficiency virus [HIV] disease: Secondary | ICD-10-CM

## 2020-10-05 DIAGNOSIS — I10 Essential (primary) hypertension: Secondary | ICD-10-CM | POA: Diagnosis not present

## 2020-10-05 DIAGNOSIS — E538 Deficiency of other specified B group vitamins: Secondary | ICD-10-CM | POA: Diagnosis not present

## 2020-10-05 DIAGNOSIS — Z09 Encounter for follow-up examination after completed treatment for conditions other than malignant neoplasm: Secondary | ICD-10-CM

## 2020-10-05 DIAGNOSIS — E871 Hypo-osmolality and hyponatremia: Secondary | ICD-10-CM

## 2020-10-05 DIAGNOSIS — E519 Thiamine deficiency, unspecified: Secondary | ICD-10-CM

## 2020-10-05 DIAGNOSIS — R609 Edema, unspecified: Secondary | ICD-10-CM

## 2020-10-05 DIAGNOSIS — R202 Paresthesia of skin: Secondary | ICD-10-CM

## 2020-10-05 DIAGNOSIS — G610 Guillain-Barre syndrome: Secondary | ICD-10-CM

## 2020-10-05 MED ORDER — CARVEDILOL 12.5 MG PO TABS
ORAL_TABLET | ORAL | 11 refills | Status: DC
  Filled 2020-10-05: qty 60, fill #0
  Filled 2020-11-13: qty 60, 30d supply, fill #0
  Filled 2020-12-25: qty 60, 30d supply, fill #1

## 2020-10-05 MED ORDER — FOLIC ACID 1 MG PO TABS
ORAL_TABLET | Freq: Every day | ORAL | 3 refills | Status: AC
  Filled 2020-10-05: qty 30, fill #0
  Filled 2020-11-13: qty 30, 30d supply, fill #0

## 2020-10-05 MED ORDER — FUROSEMIDE 20 MG PO TABS
ORAL_TABLET | ORAL | 0 refills | Status: DC
  Filled 2020-10-05: qty 30, 30d supply, fill #0

## 2020-10-05 MED ORDER — GABAPENTIN 400 MG PO CAPS: 400.0000 mg | ORAL_CAPSULE | Freq: Four times a day (QID) | ORAL | 3 refills | Status: DC

## 2020-10-05 MED ORDER — MAGNESIUM OXIDE 400 MG PO TABS
ORAL_TABLET | ORAL | 0 refills | Status: AC
  Filled 2020-10-05: qty 90, fill #0

## 2020-10-05 MED ORDER — THIAMINE HCL 100 MG PO TABS
ORAL_TABLET | Freq: Every day | ORAL | 0 refills | Status: AC
  Filled 2020-10-05: qty 90, fill #0

## 2020-10-05 NOTE — Progress Notes (Signed)
Patient ID: Harold Flores, male   DOB: 03/25/1991, 31 y.o.   MRN: [id number]     Naji Mehringer, is a 30 y.o. male  BSJ:[id number]  TML:[id number]  DOB - 09-10-1990  Subjective:  Chief Complaint and HPI: Harold Flores is a 30 y.o. male here today to establish care and for a follow up visit Initial hospitalization 2/24-08/15/2020 then inpatient rehab 3/8-3/25/2022.  He has f/up appts with ID, inpatient rehab, and neurology coming up.  He is doing well.  He is still walking with a walker but is about to change to a cane.  Other than that, he is able to perform all his ADL.    I am RF the meds he needs RF.  He is now compliant on his medication regimen.  Saw ID 09/14/2020.  They did CBC and CMP  From discharge summary hospitalization 2/24-3/8:  Principal Problem:   Acute metabolic encephalopathy Active Problems:   GAD (generalized anxiety disorder)   Essential hypertension, benign   Gastroesophageal reflux disease without esophagitis   HIV (human immunodeficiency virus infection) (HCC)   Hypokalemia   Lower extremity weakness   Alcohol abuse   Normocytic anemia   Diarrhea   Hypomagnesemia   Hyponatremia   Folic acid deficiency   Encephalopathy    Acute metabolic encephalopathy Patient had delirium hallucinations initially thought to be metabolic in nature.  History of alcohol use at home.  MRI was negative.  He was empirically treated with IVIG for 5 days for GBS with lower extremity weakness and ganciclovir high-dose thiamine and folic acid.  Mentation has improved at this time.  CSF CMV is negative so ganciclovir has been discontinued on 08/14/2020.Marland Kitchen  Patient will be continued on thiamine and folic acid on   Alcohol abuse On the evening of 3/1 patient appeared to have furtherwithdrawal symptoms and was given Ativan under CIWA protocol.  Subsequently CIWA was discontinued.  We will continue thiamine folic acid.  Patient is motivated to quit drinking.  Bilateral lower  extremity weakness Neurology was consulted.  Could not rule out GBS so received 5 days of IVIG.  MRI of the cervical thoracic lumbar spine was unremarkable.  Continue thiamine and folic acid for now.  Weakness gradually improving. 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 physical therapy recommending CIR placement.  Folic acid deficiency Will be continued on folate supplements on discharge, initially received high-dose IV folic acid   HIV  Non compliant to meds. CD4 absolute count is 40,HIV RNA 104,000.  Patient was seen by ID.  Recommend Biktarvy and Bactrim on discharge.  Diarrhea -Patient had negative GI pathogen panel, CMV titer negative as well.  Overall improved.  Continue loperamide.   Normocytic anemia Folic acid levels low.  Continue to monitor closely.  Latest hemoglobin of 8.5. Iron stores adequate.Folate low, 3.4.Vitamin B12 726  Hyponatremia, mild Would need to monitor as outpatient.  Hypomagnesemia Likely from GI loss from diarrhea.  Continue to replenish with magnesium oxide.  Will receive 2 grams of magnesium sulfate IV prior to discharge.  Hypokalemia Improved after replacement.  Essential hypertension -Continue amlodipine and Coreg on discharge.  Generalized anxiety disorder -Continue Zoloft  Elevated troponin Likely nonspecific elevation secondary to anemia, hypokalemia demand ischemia.  No chest pain.  EKG showed sinus tachycardia.  Echo with no significant abnormality.  CT angiogram of the chest was negative for PE.  Cardiology recommended no further work-up.     Inpatient rehab Admit date: 08/15/2020 Discharge date: 09/01/2020  Discharge Diagnoses:  Principal Problem:  Progressive focal motor weakness Active Problems:   Transaminitis   ETOH abuse   Sinus tachycardia   Essential hypertension   AIDS (HCC)   Anemia   Leukopenia   GBS (Guillain Barre syndrome) (HCC)   Gastroesophageal reflux disease  Brief HPI:   Harold Flores is a 30 y.o. male  with history of EtOH abuse, syphilis, HIV/AIDS who was admitted on 08/06/2020 with BLE weakness and difficulty walking, paresthesias, decreased appetite and loose stools as well as confusion with hallucinations.  He was found to have significant hyperkalemia with elevated troponins felt to be multifactorial due to LVH, tachycardia and anemia.  Work-up was suspicious for GBS and was treated with IVIG.  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  ID expressed concerns of advanced HIV AIDS with viral load of 104,000 and CD4 of 40 therefore Biktarvy added for treatment.  He did have improvement in lower extremity strength but continued to have instability with ataxia, balance deficits as well as waxing and waning of mentation.  CIR was recommended due to functional decline.Marland Kitchen.   Hospital Course: Harold Flores was admitted to rehab 08/15/2020 for inpatient therapies to consist of PT and OT at least three hours five days a week. Past admission physiatrist, therapy team and rehab RN have worked together to provide customized collaborative inpatient rehab. Blood pressures were monitored on TID basis and have been well controlled on current regimen. He continues on Bitarvy and is tolerating this without SE. Mentation has improved and no signs of anxiety noted during his stay.  P.o. intake has improved and diarrhea has resolved.  Follow up magnesium levels remain low and he was supplemented with IV doses and was started on oral supplement during his stay. Anticipate that it will continue to improve with improvement in intake and abstinence from alcohol.    Team has provided ego support and his mood has been stable on Zoloft alone.  He has been educated on importance of medication compliance as well as avoidance of alcohol and  marijuana.  He continues on folic acid and thiamine for supplementation.  Follow-up CBC showed slow improvement in white count and H&H.  Check of BMET showed electrolytes and renal status to be within normal limits.  Bilateral peripherals were used at nights and he was fitted with bilateral AFO for foot drop to help with gait quality.  His spasticity has improved and pain is currently managed on gabapentin 3 times daily.  He has made steady progress during his stay and is currently at supervision level.  He has been educated on home exercise program as no home health available to provide charity care.   Rehab course: During patient's stay in rehab weekly team conferences were held to monitor patient's progress, set goals and discuss barriers to discharge. At admission, patient required mod assist with ADL tasks and with mobility. Speech therapy evaluation revealed cognitive linguistic skills to be WNL therefore not needed during his stay.  He has had improvement in activity tolerance, balance, postural control as well as ability to compensate for deficits. He is able to complete ADL tasks with supervision.  He is independent for transfers and requires supervision to ambulate 300' with RW and cues. Family education was completed.    ED/Hospital notes reviewed.     ROS:   Constitutional:  No f/c, No  night sweats, No unexplained weight loss. EENT:  No vision changes, No blurry vision, No hearing changes. No mouth, throat, or ear problems.  Respiratory: No cough, No SOB Cardiac: No CP, no palpitations GI:  No abd pain, No N/V/D. GU: No Urinary s/sx Musculoskeletal: +leg pain/paresthesias Neuro: No headache, no dizziness, no motor weakness.  Skin: No rash Endocrine:  No polydipsia. No polyuria.  Psych: Denies SI/HI  No problems updated.  ALLERGIES: No Known Allergies  PAST MEDICAL HISTORY: Past Medical History:  Diagnosis Date  . Anal fissure   . Anal fistula   . Anal ulcer   . Asthma    . Epistaxis   . GAD (generalized anxiety disorder)   . GERD (gastroesophageal reflux disease)   . Hemorrhoid   . HIV (human immunodeficiency virus infection) (HCC)   . Hypertension   . Syphilis   . Varicella     MEDICATIONS AT HOME: Prior to Admission medications   Medication Sig Start Date End Date Taking? Authorizing Provider  amLODipine (NORVASC) 10 MG tablet TAKE 1 TABLET (10 MG TOTAL) BY MOUTH DAILY. 09/14/20 09/14/21 Yes Randall Hiss, MD  bictegravir-emtricitabine-tenofovir AF (BIKTARVY) 50-200-25 MG TABS tablet TAKE 1 TABLET BY MOUTH DAILY. 09/14/20 09/14/21 Yes Randall Hiss, MD  loperamide (IMODIUM) 2 MG capsule TAKE 1 CAPSULE (2 MG TOTAL) BY MOUTH AS NEEDED FOR DIARRHEA OR LOOSE STOOLS. 08/31/20 08/31/21 Yes Love, Evlyn Kanner, PA-C  Multiple Vitamin (MULTIVITAMIN) tablet Take 1 tablet by mouth daily.   Yes [provider]  nicotine polacrilex (NICORETTE) 2 MG gum TAKE 1 EACH (2 MG TOTAL) BY MOUTH AS NEEDED FOR SMOKING CESSATION. 08/31/20 08/31/21 Yes Love, Evlyn Kanner, PA-C  sertraline (ZOLOFT) 100 MG tablet TAKE 1 TABLET (100 MG TOTAL) BY MOUTH DAILY. 09/14/20 09/14/21 Yes Randall Hiss, MD  carvedilol (COREG) 12.5 MG tablet TAKE 1 TABLET BY MOUTH TWICE PER DAY 10/05/20 10/05/21  Anders Simmonds, PA-C  folic acid (FOLVITE) 1 MG tablet TAKE 1 TABLET (1 MG TOTAL) BY MOUTH DAILY. 10/05/20 10/05/21  Anders Simmonds, PA-C  furosemide (LASIX) 20 MG tablet TAKE 1 TABLET (20 MG TOTAL) BY MOUTH DAILY IN THE MORNING AS NEEDED FOR FLUID OR EDEMA. 10/05/20 10/05/21  Anders Simmonds, PA-C  gabapentin (NEURONTIN) 400 MG capsule Take 1 capsule (400 mg total) by mouth 4 (four) times daily. 10/05/20 10/05/21  Anders Simmonds, PA-C  magnesium oxide (MAG-OX) 400 MG tablet TAKE 1 TABLET (500 MG TOTAL) BY MOUTH THREE TIMES DAILY. 10/05/20 10/05/21  Anders Simmonds, PA-C  sulfamethoxazole-trimethoprim (BACTRIM DS) 800-160 MG tablet TAKE 1 TABLET BY MOUTH DAILY. Patient not taking: Reported  on 10/05/2020 09/14/20 09/14/21  Daiva Eves, Lisette Grinder, MD  thiamine 100 MG tablet TAKE 1 TABLET (100 MG TOTAL) BY MOUTH DAILY. 10/05/20 10/05/21  Anders Simmonds, PA-C     Objective:  EXAM:   Vitals:   10/05/20 1416  BP: 130/88  Pulse: (!) 104  Resp: 20  SpO2: 97%  Weight: 170 lb (77.1 kg)  Height: 5\' 10"  (1.778 m)    General appearance : A&OX3. NAD. Non-toxic-appearing, ambulates with walker HEENT: Atraumatic and Normocephalic.  PERRLA. EOM intact.  Neck: supple, no JVD. No cervical lymphadenopathy. No thyromegaly Chest/Lungs:  Breathing-non-labored, Good air entry bilaterally, breath sounds normal without rales, rhonchi, or wheezing  CVS: S1 S2 regular, no murmurs, gallops, rubs  Extremities: Bilateral Lower Ext shows no edema, both legs are warm to touch with = pulse throughout.  Braces  are worn Neurology:  CN II-XII grossly intact, Non focal.   Psych:  TP linear. J/I WNL. Normal speech. Appropriate eye contact and affect.  Skin:  No Rash  Data Review Lab Results  Component Value Date   HGBA1C 5.5 04/23/2016   HGBA1C 5.6 06/12/2009     Assessment & Plan   1. Essential hypertension, benign Continue amlodipine and carvedilol.  He has RF on amlodipine - carvedilol (COREG) 12.5 MG tablet; TAKE 1 TABLET BY MOUTH TWICE PER DAY  Dispense: 60 tablet; Refill: 11  2. Peripheral edema He is not having to use this much - furosemide (LASIX) 20 MG tablet; TAKE 1 TABLET (20 MG TOTAL) BY MOUTH DAILY IN THE MORNING AS NEEDED FOR FLUID OR EDEMA.  Dispense: 30 tablet; Refill: 0  3. Folic acid deficiency - folic acid (FOLVITE) 1 MG tablet; TAKE 1 TABLET (1 MG TOTAL) BY MOUTH DAILY.  Dispense: 30 tablet; Refill: 3  4. Hypomagnesemia - Magnesium - magnesium oxide (MAG-OX) 400 MG tablet; TAKE 1 TABLET (500 MG TOTAL) BY MOUTH THREE TIMES DAILY.  Dispense: 90 tablet; Refill: 0  5. Symptomatic HIV infection (HCC) Followed by ID and on Biktarvy  6. Paresthesia of both lower extremities -  gabapentin (NEURONTIN) 400 MG capsule; Take 1 capsule (400 mg total) by mouth 4 (four) times daily.  Dispense: 120 capsule; Refill: 3 - Ambulatory referral to Neurology  7. Hospital discharge follow-up  8. Hyponatremia Back to normal 4/7  9. Thiamine deficiency - thiamine 100 MG tablet; TAKE 1 TABLET (100 MG TOTAL) BY MOUTH DAILY.  Dispense: 90 tablet; Refill: 0 - Vitamin B1  10. GBS (Guillain Barre syndrome) [facility]) - Ambulatory referral to Neurology-has appt upcomong with Dr [email or web address] [email or web address] is the website for narcotics anonymous [employer].cy (website) or 925-067-0025 is the information for alcoholics anonymous Both are free and immediately available for help with alcohol and drug use   Patient have been counseled extensively about nutrition and exercise  Return in about 6 weeks (around 11/16/2020) for assign PCP.  The patient was given clear instructions to go to ER or return to medical center if symptoms don't improve, worsen or new problems develop. The patient verbalized understanding. The patient was told to call to get lab results if they haven't heard anything in the next week.     Georgian Co, PA-C [facility] [place], [place] [phone]   10/05/2020, 3:28 PM

## 2020-10-10 ENCOUNTER — Other Ambulatory Visit: Payer: Self-pay

## 2020-10-10 ENCOUNTER — Ambulatory Visit: Payer: Self-pay

## 2020-10-12 ENCOUNTER — Other Ambulatory Visit: Payer: Self-pay

## 2020-10-16 ENCOUNTER — Telehealth: Payer: Self-pay

## 2020-10-16 NOTE — Telephone Encounter (Signed)
Patient's mother called office today requesting assistance getting appt for counseling. States patient has increased his alcohol intake and not eating much.  Will have front desk schedule appointment for patient to follow up with Marylu Lund. Will provide recourses for patient regarding counseling outside of our office. Patient's mother is not sure if patient is taking medication.

## 2020-10-16 NOTE — Telephone Encounter (Signed)
Thanks Cece

## 2020-10-19 ENCOUNTER — Other Ambulatory Visit: Payer: Self-pay

## 2020-10-19 ENCOUNTER — Ambulatory Visit: Payer: Self-pay

## 2020-10-19 ENCOUNTER — Encounter: Payer: Self-pay | Admitting: Infectious Disease

## 2020-10-19 ENCOUNTER — Ambulatory Visit (INDEPENDENT_AMBULATORY_CARE_PROVIDER_SITE_OTHER): Payer: Self-pay | Admitting: Infectious Disease

## 2020-10-19 VITALS — BP 129/88 | HR 97 | Temp 98.4°F | Wt 165.0 lb

## 2020-10-19 DIAGNOSIS — A530 Latent syphilis, unspecified as early or late: Secondary | ICD-10-CM

## 2020-10-19 DIAGNOSIS — B20 Human immunodeficiency virus [HIV] disease: Secondary | ICD-10-CM

## 2020-10-19 DIAGNOSIS — F411 Generalized anxiety disorder: Secondary | ICD-10-CM

## 2020-10-19 DIAGNOSIS — G610 Guillain-Barre syndrome: Secondary | ICD-10-CM

## 2020-10-19 DIAGNOSIS — I10 Essential (primary) hypertension: Secondary | ICD-10-CM

## 2020-10-19 DIAGNOSIS — R269 Unspecified abnormalities of gait and mobility: Secondary | ICD-10-CM

## 2020-10-19 DIAGNOSIS — E538 Deficiency of other specified B group vitamins: Secondary | ICD-10-CM

## 2020-10-19 DIAGNOSIS — G9340 Encephalopathy, unspecified: Secondary | ICD-10-CM

## 2020-10-19 DIAGNOSIS — F1010 Alcohol abuse, uncomplicated: Secondary | ICD-10-CM

## 2020-10-19 DIAGNOSIS — Z23 Encounter for immunization: Secondary | ICD-10-CM

## 2020-10-19 LAB — VITAMIN B1: Thiamine: 185.6 nmol/L (ref 66.5–200.0)

## 2020-10-19 LAB — MAGNESIUM: Magnesium: 1.7 mg/dL (ref 1.6–2.3)

## 2020-10-19 MED ORDER — SERTRALINE HCL 150 MG PO CAPS: 150.0000 mg | ORAL_CAPSULE | Freq: Every day | ORAL | 5 refills | Status: DC

## 2020-10-19 NOTE — Progress Notes (Signed)
Subjective:   Chief complaint: followup for HIV disease, GB, alcoholism tearful and not spending time with his husband   Patient ID: Harold Flores, male    DOB: [birth year], 30 y.o.   MRN: [id number]  HPI  30 year old Black man living with HIV/AIDS who had been out of care and presented with loss of strength in his lower extremities gait instability numbness in his hands encephalopathy with waxing and waning neurological status.  There is initially concern for possible opportunistic infection and he underwent lumbar puncture which did not reveal an opportunistic infection.  He was treated for Guillain-Barr syndrome with IVIG and corticosteroids.  He was also found to be deficient of folate and B12 in the context of severe alcoholism.  He was treated with replenishment of these vitamins.  He was restarted on antiretroviral therapy in the form of Biktarvy.  His initial viral load was greater than 100,000 copies.  CD4 count was only 40 initially but rose to over 100 while he was in the hospital he ultimately improved sufficiently to be transferred to inpatient rehab where he stayed until last week when he was discharged in the hospital.  He came to clinic today accompanied by his mother.  Apparently he has had a relapse of his alcoholism and his mother showed me multiple pictures of empty 12% cans of alcoholic drinks spilling over waste bins.  Apparently this has occurred in the context of him not being able to work due to his CNS problems and also related (according to the pt) to his not being able to see his husband. His mother emphasized that when the patient lived with his husband that the two of them drank even more heavily than Kimo is now again doing.  There is also conflict between [employer] and his husband's Mom.    Past Medical History:  Diagnosis Date  . Anal fissure   . Anal fistula   . Anal ulcer   . Asthma   . Epistaxis   . GAD (generalized anxiety disorder)   .  GERD (gastroesophageal reflux disease)   . Hemorrhoid   . HIV (human immunodeficiency virus infection) (HCC)   . Hypertension   . Syphilis   . Varicella     Past Surgical History:  Procedure Laterality Date  . EYE SURGERY  1994   blephroplasty right eye  . RADIOLOGY WITH ANESTHESIA N/A 08/08/2020   Procedure: MRI WITH ANESTHESIA- BRAIN WITH AND WITHOUT CONTRAST,  CERVICAL SPINE WITH AND WITHOUT CONTRAST, LUMBAR SPINE WITH WITHOUT CONTRAST, THORACIC SPINE WITH WITHOUT CONTRAST;  Surgeon: Radiologist, Medication, MD;  Location: [facility] OR;  Service: Radiology;  Laterality: N/A;    Family History  Problem Relation Age of Onset  . Diabetes Mother   . Hyperlipidemia Mother   . Diabetes Maternal Grandmother   . Heart disease Paternal Grandfather   . Cancer Neg Hx       Social History   Socioeconomic History  . Marital status: Single    Spouse name: Not on file  . Number of children: 0  . Years of education: 47  . Highest education level: Not on file  Occupational History  . Occupation: [employer]  Tobacco Use  . Smoking status: Current Every Day Smoker    Packs/day: 0.50    Years: 5.00    Pack years: 2.50    Types: E-cigarettes, Cigarettes  . Smokeless tobacco: Never Used  Substance and Sexual Activity  . Alcohol use: Yes    Alcohol/week:  7.0 standard drinks    Types: 7 Cans of beer per week    Comment: Rarely  . Drug use: Yes    Types: Marijuana  . Sexual activity: Yes    Partners: Male    Birth control/protection: Condom    Comment: pt. declined condoms 10/2020  Other Topics Concern  . Not on file  Social History Narrative   Lives at home   A student   architectural games on computer   Fun: Music   Denies religious beliefs that would effect health care.    Social Determinants of Health   Financial Resource Strain: Not on file  Food Insecurity: Not on file  Transportation Needs: Not on file  Physical Activity: Not on file  Stress: Not on file  Social  Connections: Not on file    No Known Allergies   Current Outpatient Medications:  .  amLODipine (NORVASC) 10 MG tablet, TAKE 1 TABLET (10 MG TOTAL) BY MOUTH DAILY., Disp: 30 tablet, Rfl: 11 .  bictegravir-emtricitabine-tenofovir AF (BIKTARVY) 50-200-25 MG TABS tablet, TAKE 1 TABLET BY MOUTH DAILY., Disp: 30 tablet, Rfl: 11 .  carvedilol (COREG) 12.5 MG tablet, TAKE 1 TABLET BY MOUTH TWICE PER DAY, Disp: 60 tablet, Rfl: 11 .  folic acid (FOLVITE) 1 MG tablet, TAKE 1 TABLET (1 MG TOTAL) BY MOUTH DAILY., Disp: 30 tablet, Rfl: 3 .  furosemide (LASIX) 20 MG tablet, TAKE 1 TABLET (20 MG TOTAL) BY MOUTH DAILY IN THE MORNING AS NEEDED FOR FLUID OR EDEMA., Disp: 30 tablet, Rfl: 0 .  gabapentin (NEURONTIN) 400 MG capsule, Take 1 capsule (400 mg total) by mouth 4 (four) times daily., Disp: 120 capsule, Rfl: 3 .  loperamide (IMODIUM) 2 MG capsule, TAKE 1 CAPSULE (2 MG TOTAL) BY MOUTH AS NEEDED FOR DIARRHEA OR LOOSE STOOLS., Disp: 30 capsule, Rfl: 0 .  magnesium oxide (MAG-OX) 400 MG tablet, TAKE 1 TABLET (500 MG TOTAL) BY MOUTH THREE TIMES DAILY., Disp: 90 tablet, Rfl: 0 .  Multiple Vitamin (MULTIVITAMIN) tablet, Take 1 tablet by mouth daily., Disp: , Rfl:  .  nicotine polacrilex (NICORETTE) 2 MG gum, TAKE 1 EACH (2 MG TOTAL) BY MOUTH AS NEEDED FOR SMOKING CESSATION., Disp: 100 each, Rfl: 0 .  sertraline (ZOLOFT) 100 MG tablet, TAKE 1 TABLET (100 MG TOTAL) BY MOUTH DAILY., Disp: 30 tablet, Rfl: 11 .  sulfamethoxazole-trimethoprim (BACTRIM DS) 800-160 MG tablet, TAKE 1 TABLET BY MOUTH DAILY. (Patient not taking: Reported on 10/05/2020), Disp: 30 tablet, Rfl: 5 .  thiamine 100 MG tablet, TAKE 1 TABLET (100 MG TOTAL) BY MOUTH DAILY., Disp: 90 tablet, Rfl: 0   Review of Systems  Constitutional: Negative for activity change, appetite change, chills, diaphoresis, fatigue, fever and unexpected weight change.  HENT: Negative for congestion, rhinorrhea, sinus pressure, sneezing, sore throat and trouble swallowing.    Eyes: Negative for photophobia and visual disturbance.  Respiratory: Negative for cough, chest tightness, shortness of breath, wheezing and stridor.   Cardiovascular: Negative for chest pain, palpitations and leg swelling.  Gastrointestinal: Negative for abdominal distention, abdominal pain, anal bleeding, blood in stool, constipation, diarrhea, nausea and vomiting.  Genitourinary: Negative for difficulty urinating, dysuria, flank pain and hematuria.  Musculoskeletal: Positive for gait problem. Negative for arthralgias, back pain, joint swelling and myalgias.  Skin: Negative for color change, pallor, rash and wound.  Neurological: Negative for dizziness, tremors, weakness and light-headedness.  Hematological: Negative for adenopathy. Does not bruise/bleed easily.  Psychiatric/Behavioral: Positive for behavioral problems, decreased concentration and dysphoric mood. Negative  for agitation, confusion and sleep disturbance.       Objective:   Physical Exam Constitutional:      Appearance: He is well-developed.  HENT:     Head: Normocephalic and atraumatic.  Eyes:     Conjunctiva/sclera: Conjunctivae normal.  Cardiovascular:     Rate and Rhythm: Normal rate and regular rhythm.  Pulmonary:     Effort: Pulmonary effort is normal. No respiratory distress.     Breath sounds: No wheezing.  Abdominal:     General: There is no distension.     Palpations: Abdomen is soft.  Musculoskeletal:        General: No tenderness. Normal range of motion.     Cervical back: Normal range of motion and neck supple.  Skin:    General: Skin is warm and dry.     Coloration: Skin is not pale.     Findings: No erythema or rash.  Neurological:     Mental Status: He is alert and oriented to person, place, and time.  Psychiatric:        Attention and Perception: Attention normal.        Mood and Affect: Affect is tearful.        Thought Content: Thought content normal.        Cognition and Memory:  Cognition and memory normal.        Judgment: Judgment normal.           Assessment & Plan:   Alcoholism: has relapsed again. I am having him see Marylu Lund. Will check CMP  Depression: will increase his zoloft dose and he is to see Marylu Lund weekly if possible   HIV and AIDS,: continue Biktarvy, check labs, continue Bactrim for now  Possible Guillain-Barr syndrome: Status post treatment steroids and IVIG  ollow-up with rehab   Folate deficiency continue folate filled through [facility] wellness.  I spent greater than 40 minutes with the patient including greater than 50% of time in face to face counsel of the patient and in coordination of hiscare.

## 2020-10-19 NOTE — Progress Notes (Signed)
   Covid-19 Vaccination Clinic  Name:  GOMER FRANCE    MRN: [id number] DOB: February 08, 1991  10/19/2020  Mr. Wickens was observed post Covid-19 immunization for 15 minutes without incident. He was provided with Vaccine Information Sheet and instruction to access the V-Safe system.   Mr. Debroux was instructed to call 911 with any severe reactions post vaccine: Marland Kitchen Difficulty breathing  . Swelling of face and throat  . A fast heartbeat  . A bad rash all over body  . Dizziness and weakness     Dixie Jafri T Pricilla Loveless

## 2020-10-20 LAB — T-HELPER CELL (CD4) - (RCID CLINIC ONLY)
CD4 % Helper T Cell: 11 % — ABNORMAL LOW (ref 33–65)
CD4 T Cell Abs: 219 /uL — ABNORMAL LOW (ref 400–1790)

## 2020-10-22 LAB — HIV-1 RNA QUANT-NO REFLEX-BLD
HIV 1 RNA Quant: <20 Copies/mL — ABNORMAL HIGH
HIV-1 RNA Quant, Log: <1.3 Log cps/mL — ABNORMAL HIGH

## 2020-10-24 ENCOUNTER — Encounter: Payer: Self-pay | Admitting: Physical Medicine & Rehabilitation

## 2020-10-26 ENCOUNTER — Ambulatory Visit: Payer: Self-pay

## 2020-11-03 ENCOUNTER — Telehealth: Payer: Self-pay

## 2020-11-03 NOTE — Telephone Encounter (Signed)
Received call from patient's mother. She is crying stating that the patient is drinking again and she doesn't know what to do. She states he is "passed out drunk" now at his grandmother's house. She reports that she was told the patient went into the bathroom for about 30 minutes and then passed out. She states he is breathing. RN advised her to call EMS to evaluate the patient.   She also wants the patient to see a counselor. Scheduled for 11/07/20 at 9am.   Emphasized to patient's mother that patient's safety is first priority and that he needs to be evaluated by EMS. She verbalized understanding and has no further questions.   Sandie Ano, RN

## 2020-11-07 ENCOUNTER — Ambulatory Visit: Payer: Self-pay

## 2020-11-07 ENCOUNTER — Other Ambulatory Visit: Payer: Self-pay

## 2020-11-09 ENCOUNTER — Other Ambulatory Visit: Payer: Self-pay

## 2020-11-09 ENCOUNTER — Ambulatory Visit (INDEPENDENT_AMBULATORY_CARE_PROVIDER_SITE_OTHER): Payer: Self-pay

## 2020-11-09 ENCOUNTER — Telehealth: Payer: Self-pay

## 2020-11-09 DIAGNOSIS — Z23 Encounter for immunization: Secondary | ICD-10-CM

## 2020-11-09 MED ORDER — SERTRALINE HCL 50 MG PO TABS: ORAL_TABLET | ORAL | 5 refills | Status: DC

## 2020-11-09 NOTE — Addendum Note (Signed)
Addended by: [name] on: 11/09/2020 12:33 PM   Modules accepted: Orders

## 2020-11-09 NOTE — Telephone Encounter (Signed)
Recieived notification from pharmacy that serterline rx of 150mg  caps by mouth daily is NOT covered under [employer]. Alternative would be Serterline 100mg  tablet take 0.5 tablet daily or Sertraline 50mg  tablet take 3 tablets by mouth daily.  Routing to MD for advise/approval.  

## 2020-11-09 NOTE — Progress Notes (Signed)
   Covid-19 Vaccination Clinic  Name:  Harold Flores    MRN: [id number] DOB: 01-26-1991  11/09/2020  Harold Flores was observed post Covid-19 immunization for 15 minutes without incident. He was provided with Vaccine Information Sheet and instruction to access the V-Safe system.   Harold Flores was instructed to call 911 with any severe reactions post vaccine: Marland Kitchen Difficulty breathing  . Swelling of face and throat  . A fast heartbeat  . A bad rash all over body  . Dizziness and weakness

## 2020-11-13 ENCOUNTER — Other Ambulatory Visit: Payer: Self-pay

## 2020-11-14 ENCOUNTER — Ambulatory Visit: Payer: Self-pay

## 2020-11-14 ENCOUNTER — Other Ambulatory Visit: Payer: Self-pay

## 2020-12-20 ENCOUNTER — Ambulatory Visit (INDEPENDENT_AMBULATORY_CARE_PROVIDER_SITE_OTHER): Payer: Medicaid Other | Admitting: Infectious Disease

## 2020-12-20 ENCOUNTER — Encounter: Payer: Self-pay | Admitting: Infectious Disease

## 2020-12-20 ENCOUNTER — Other Ambulatory Visit: Payer: Self-pay

## 2020-12-20 VITALS — BP 125/87 | HR 81 | Temp 98.3°F

## 2020-12-20 DIAGNOSIS — B20 Human immunodeficiency virus [HIV] disease: Secondary | ICD-10-CM | POA: Diagnosis not present

## 2020-12-20 DIAGNOSIS — A530 Latent syphilis, unspecified as early or late: Secondary | ICD-10-CM | POA: Diagnosis not present

## 2020-12-20 DIAGNOSIS — G610 Guillain-Barre syndrome: Secondary | ICD-10-CM

## 2020-12-20 DIAGNOSIS — I10 Essential (primary) hypertension: Secondary | ICD-10-CM

## 2020-12-20 DIAGNOSIS — A6002 Herpesviral infection of other male genital organs: Secondary | ICD-10-CM | POA: Diagnosis not present

## 2020-12-20 DIAGNOSIS — E559 Vitamin D deficiency, unspecified: Secondary | ICD-10-CM

## 2020-12-20 DIAGNOSIS — F411 Generalized anxiety disorder: Secondary | ICD-10-CM

## 2020-12-20 DIAGNOSIS — F1010 Alcohol abuse, uncomplicated: Secondary | ICD-10-CM | POA: Diagnosis not present

## 2020-12-20 DIAGNOSIS — G9340 Encephalopathy, unspecified: Secondary | ICD-10-CM

## 2020-12-20 NOTE — Progress Notes (Signed)
Subjective:   Chief complaint: followup for HIV disease, GB, alcoholism tearful and not spending time with his husband   Patient ID: Harold Flores, male    DOB: 1990-12-13, 30 y.o.   MRN: [id number]  HPI  30 year old Black man living with HIV/AIDS who had been out of care and presented with loss of strength in his lower extremities gait instability numbness in his hands encephalopathy with waxing and waning neurological status.  There is initially concern for possible opportunistic infection and he underwent lumbar puncture which did not reveal an opportunistic infection.  He was treated for Guillain-Barr syndrome with IVIG and corticosteroids.  He was also found to be deficient of folate and B12 in the context of severe alcoholism.  He was treated with replenishment of these vitamins.  He was restarted on antiretroviral therapy in the form of Biktarvy.  His initial viral load was greater than 100,000 copies.  CD4 count was only 40 initially but rose to over 100 while he was in the hospital he ultimately improved sufficiently to be transferred to inpatient rehab where he stayed until last week when he was discharged in the hospital.  When I last saw him he was still having his mother who showed me pictures of multiple trash cans full of" carbonated beverages.  He had labs done at that visit which included a viral load that was suppressed but his metabolic panel was not done including LFTs.  Since then he is moved out from living with his mother and is living with his grandmother.  He says his drinking is improved.  He says before he drank from sun up to sundown and now he says he drinks 1 beer at night.  His husband who also apparently according to his mother is a heavy drinker is still living in [facility] but trying to move to [place].  Logan is unaware whether his husband is HIV positive or negative.  I have offered to have his husband come here for testing (which  he could have as part of our PURPOSE 2 screening study ) and he might be eligible for PrEP part of that study.       Past Medical History:  Diagnosis Date   Anal fissure    Anal fistula    Anal ulcer    Asthma    Epistaxis    GAD (generalized anxiety disorder)    GERD (gastroesophageal reflux disease)    Hemorrhoid    HIV (human immunodeficiency virus infection) (HCC)    Hypertension    Syphilis    Varicella     Past Surgical History:  Procedure Laterality Date   EYE SURGERY  1994   blephroplasty right eye   RADIOLOGY WITH ANESTHESIA N/A 08/08/2020   Procedure: MRI WITH ANESTHESIA- BRAIN WITH AND WITHOUT CONTRAST,  CERVICAL SPINE WITH AND WITHOUT CONTRAST, LUMBAR SPINE WITH WITHOUT CONTRAST, THORACIC SPINE WITH WITHOUT CONTRAST;  Surgeon: Radiologist, Medication, MD;  Location: [facility] OR;  Service: Radiology;  Laterality: N/A;    Family History  Problem Relation Age of Onset   Diabetes Mother    Hyperlipidemia Mother    Diabetes Maternal Grandmother    Heart disease Paternal Grandfather    Cancer Neg Hx       Social History   Socioeconomic History   Marital status: Single    Spouse name: Not on file   Number of children: 0   Years of education: 13   Highest education level: Not on  file  Occupational History   Occupation: [employer]  Tobacco Use   Smoking status: Every Day    Packs/day: 0.50    Years: 5.00    Pack years: 2.50    Types: E-cigarettes, Cigarettes   Smokeless tobacco: Never  Substance and Sexual Activity   Alcohol use: Yes    Alcohol/week: 7.0 standard drinks    Types: 7 Cans of beer per week    Comment: Rarely   Drug use: Yes    Types: Marijuana   Sexual activity: Yes    Partners: Male    Birth control/protection: Condom    Comment: pt. declined condoms 10/2020  Other Topics Concern   Not on file  Social History Narrative   Lives at home   A student   architectural games on computer   Fun: Music   Denies religious beliefs that would  effect health care.    Social Determinants of Health   Financial Resource Strain: Not on file  Food Insecurity: Not on file  Transportation Needs: Not on file  Physical Activity: Not on file  Stress: Not on file  Social Connections: Not on file    No Known Allergies   Current Outpatient Medications:    amLODipine (NORVASC) 10 MG tablet, TAKE 1 TABLET (10 MG TOTAL) BY MOUTH DAILY., Disp: 30 tablet, Rfl: 11   bictegravir-emtricitabine-tenofovir AF (BIKTARVY) 50-200-25 MG TABS tablet, TAKE 1 TABLET BY MOUTH DAILY., Disp: 30 tablet, Rfl: 11   carvedilol (COREG) 12.5 MG tablet, TAKE 1 TABLET BY MOUTH TWICE PER DAY, Disp: 60 tablet, Rfl: 11   folic acid (FOLVITE) 1 MG tablet, TAKE 1 TABLET (1 MG TOTAL) BY MOUTH DAILY., Disp: 30 tablet, Rfl: 3   furosemide (LASIX) 20 MG tablet, TAKE 1 TABLET (20 MG TOTAL) BY MOUTH DAILY IN THE MORNING AS NEEDED FOR FLUID OR EDEMA., Disp: 30 tablet, Rfl: 0   gabapentin (NEURONTIN) 400 MG capsule, Take 1 capsule (400 mg total) by mouth 4 (four) times daily., Disp: 120 capsule, Rfl: 3   loperamide (IMODIUM) 2 MG capsule, TAKE 1 CAPSULE (2 MG TOTAL) BY MOUTH AS NEEDED FOR DIARRHEA OR LOOSE STOOLS., Disp: 30 capsule, Rfl: 0   magnesium oxide (MAG-OX) 400 MG tablet, TAKE 1 TABLET (500 MG TOTAL) BY MOUTH THREE TIMES DAILY., Disp: 90 tablet, Rfl: 0   Multiple Vitamin (MULTIVITAMIN) tablet, Take 1 tablet by mouth daily., Disp: , Rfl:    nicotine polacrilex (NICORETTE) 2 MG gum, TAKE 1 EACH (2 MG TOTAL) BY MOUTH AS NEEDED FOR SMOKING CESSATION., Disp: 100 each, Rfl: 0   sertraline (ZOLOFT) 50 MG tablet, Take 3 tablets by mouth daily to equal 150 mg dosage, Disp: 30 tablet, Rfl: 5   sulfamethoxazole-trimethoprim (BACTRIM DS) 800-160 MG tablet, TAKE 1 TABLET BY MOUTH DAILY., Disp: 30 tablet, Rfl: 5   thiamine 100 MG tablet, TAKE 1 TABLET (100 MG TOTAL) BY MOUTH DAILY., Disp: 90 tablet, Rfl: 0   Review of Systems  Constitutional:  Negative for chills, diaphoresis and  fever.  HENT:  Negative for congestion, hearing loss, sore throat and tinnitus.   Respiratory:  Negative for cough, shortness of breath and wheezing.   Cardiovascular:  Negative for chest pain, palpitations and leg swelling.  Gastrointestinal:  Negative for abdominal pain, blood in stool, constipation, diarrhea, nausea and vomiting.  Genitourinary:  Negative for dysuria, flank pain and hematuria.  Musculoskeletal:  Negative for back pain and myalgias.  Skin:  Negative for rash.  Neurological:  Negative for dizziness, weakness and  headaches.  Hematological:  Does not bruise/bleed easily.  Psychiatric/Behavioral:  Negative for behavioral problems, decreased concentration, dysphoric mood and suicidal ideas. The patient is not nervous/anxious.       Objective:   Physical Exam Constitutional:      General: He is not in acute distress.    Appearance: Normal appearance. He is well-developed. He is not ill-appearing or diaphoretic.  HENT:     Head: Normocephalic and atraumatic.     Right Ear: Hearing and external ear normal.     Left Ear: Hearing and external ear normal.     Nose: No nasal deformity or rhinorrhea.  Eyes:     General: No scleral icterus.    Conjunctiva/sclera: Conjunctivae normal.     Right eye: Right conjunctiva is not injected.     Left eye: Left conjunctiva is not injected.     Pupils: Pupils are equal, round, and reactive to light.  Neck:     Vascular: No JVD.  Cardiovascular:     Rate and Rhythm: Normal rate and regular rhythm.     Heart sounds: Normal heart sounds, S1 normal and S2 normal. No murmur heard.   No friction rub.  Abdominal:     General: Bowel sounds are normal. There is no distension.     Palpations: Abdomen is soft.     Tenderness: There is no abdominal tenderness.  Musculoskeletal:        General: Normal range of motion.     Right shoulder: Normal.     Left shoulder: Normal.     Cervical back: Normal range of motion and neck supple.     Right  hip: Normal.     Left hip: Normal.     Right knee: Normal.     Left knee: Normal.  Lymphadenopathy:     Head:     Right side of head: No submandibular, preauricular or posterior auricular adenopathy.     Left side of head: No submandibular, preauricular or posterior auricular adenopathy.     Cervical: No cervical adenopathy.     Right cervical: No superficial or deep cervical adenopathy.    Left cervical: No superficial or deep cervical adenopathy.  Skin:    General: Skin is warm and dry.     Coloration: Skin is not pale.     Findings: No abrasion, bruising, ecchymosis, erythema, lesion or rash.     Nails: There is no clubbing.  Neurological:     Mental Status: He is alert and oriented to person, place, and time.     Sensory: No sensory deficit.     Coordination: Coordination normal.     Gait: Gait normal.  Psychiatric:        Attention and Perception: Attention normal. He is attentive.        Mood and Affect: Mood normal. Affect is tearful.        Speech: Speech normal.        Behavior: Behavior normal. Behavior is cooperative.        Thought Content: Thought content normal.        Cognition and Memory: Cognition and memory normal.        Judgment: Judgment normal.          Assessment & Plan:   HIV disease: Back labs today and continue his BIKTARVY.  CD4 was above 200 already in May.  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  Folate deficiency continue folate filled through [facility] wellness.  Alcoholism and Depression: he says both are better though he is not completely free of etoh he drinks one beer per night. Can see Marylu Lund  I spent more than 40 minutes with the patient including face to face counseling of the patient personally reviewing radiographs, along with pertinent laboratory microbiological, virological data review of  medical records before and during the visit and in coordination of his care.

## 2020-12-21 LAB — URINE CYTOLOGY ANCILLARY ONLY
Chlamydia: NEGATIVE
Comment: NEGATIVE
Comment: NORMAL
Neisseria Gonorrhea: NEGATIVE

## 2020-12-21 LAB — T-HELPER CELL (CD4) - (RCID CLINIC ONLY)
CD4 % Helper T Cell: 13 % — ABNORMAL LOW (ref 33–65)
CD4 T Cell Abs: 184 /uL — ABNORMAL LOW (ref 400–1790)

## 2020-12-23 LAB — CBC WITH DIFFERENTIAL/PLATELET
Absolute Monocytes: 717 {cells}/uL (ref 200–950)
Basophils Absolute: 28 {cells}/uL (ref 0–200)
Basophils Relative: 0.5 %
Eosinophils Absolute: 90 {cells}/uL (ref 15–500)
Eosinophils Relative: 1.6 %
HCT: 42.3 % (ref 38.5–50.0)
Hemoglobin: 14.1 g/dL (ref 13.2–17.1)
Lymphs Abs: 1467 {cells}/uL (ref 850–3900)
MCH: 27 pg (ref 27.0–33.0)
MCHC: 33.3 g/dL (ref 32.0–36.0)
MCV: 80.9 fL (ref 80.0–100.0)
MPV: 9.4 fL (ref 7.5–12.5)
Monocytes Relative: 12.8 %
Neutro Abs: 3298 {cells}/uL (ref 1500–7800)
Neutrophils Relative %: 58.9 %
Platelets: 322 Thousand/uL (ref 140–400)
RBC: 5.23 Million/uL (ref 4.20–5.80)
RDW: 13.8 % (ref 11.0–15.0)
Total Lymphocyte: 26.2 %
WBC: 5.6 Thousand/uL (ref 3.8–10.8)

## 2020-12-23 LAB — COMPLETE METABOLIC PANEL WITH GFR
AG Ratio: 1.4 (calc) (ref 1.0–2.5)
ALT: 19 U/L (ref 9–46)
AST: 22 U/L (ref 10–40)
Albumin: 4.7 g/dL (ref 3.6–5.1)
Alkaline phosphatase (APISO): 85 U/L (ref 36–130)
BUN: 9 mg/dL (ref 7–25)
CO2: 29 mmol/L (ref 20–32)
Calcium: 9.9 mg/dL (ref 8.6–10.3)
Chloride: 98 mmol/L (ref 98–110)
Creat: 0.96 mg/dL (ref 0.60–1.24)
Globulin: 3.4 g/dL (calc) (ref 1.9–3.7)
Glucose, Bld: 93 mg/dL (ref 65–99)
Potassium: 5.4 mmol/L — ABNORMAL HIGH (ref 3.5–5.3)
Sodium: 136 mmol/L (ref 135–146)
Total Bilirubin: 0.4 mg/dL (ref 0.2–1.2)
Total Protein: 8.1 g/dL (ref 6.1–8.1)
eGFR: 110 mL/min/{1.73_m2} (ref >=60)

## 2020-12-23 LAB — LIPID PANEL
Cholesterol: 176 mg/dL (ref <200)
HDL: 85 mg/dL (ref >=40)
LDL Cholesterol (Calc): 72 mg/dL (calc)
Non-HDL Cholesterol (Calc): 91 mg/dL (calc) (ref <130)
Total CHOL/HDL Ratio: 2.1 (calc) (ref <5.0)
Triglycerides: 106 mg/dL (ref <150)

## 2020-12-23 LAB — SYPHILIS: RPR W/REFLEX TO RPR TITER AND TREPONEMAL ANTIBODIES, TRADITIONAL SCREENING AND DIAGNOSIS ALGORITHM: RPR Ser Ql: REACTIVE — AB

## 2020-12-23 LAB — RPR TITER: RPR Titer: 1:2 {titer} — ABNORMAL HIGH

## 2020-12-23 LAB — FLUORESCENT TREPONEMAL AB(FTA)-IGG-BLD: Fluorescent Treponemal ABS: REACTIVE — AB

## 2020-12-23 LAB — HIV-1 RNA QUANT-NO REFLEX-BLD
HIV 1 RNA Quant: 23 Copies/mL — ABNORMAL HIGH
HIV-1 RNA Quant, Log: 1.37 Log cps/mL — ABNORMAL HIGH

## 2020-12-25 ENCOUNTER — Other Ambulatory Visit: Payer: Self-pay

## 2021-01-01 ENCOUNTER — Other Ambulatory Visit: Payer: Self-pay

## 2021-01-09 ENCOUNTER — Ambulatory Visit: Payer: Self-pay | Admitting: Critical Care Medicine

## 2021-01-09 NOTE — Progress Notes (Deleted)
New Patient Office Visit  Subjective:  Patient ID: Harold Flores, male    DOB: 1991/01/04  Age: 30 y.o. MRN: [id number]  CC: No chief complaint on file.   HPI Harold Flores presents for [employer] 09/2020 Harold Flores is a 30 y.o. male here today to establish care and for a follow up visit Initial hospitalization 2/24-08/15/2020 then inpatient rehab 3/8-3/25/2022.  He has f/up appts with ID, inpatient rehab, and neurology coming up.   He is doing well.  He is still walking with a walker but is about to change to a cane.  Other than that, he is able to perform all his ADL.     I am RF the meds he needs RF.  He is now compliant on his medication regimen.  Saw ID 09/14/2020.  They did CBC and CMP   From discharge summary hospitalization 2/24-3/8:   Principal Problem:   Acute metabolic encephalopathy Active Problems:   GAD (generalized anxiety disorder)   Essential hypertension, benign   Gastroesophageal reflux disease without esophagitis   HIV (human immunodeficiency virus infection) (HCC)   Hypokalemia   Lower extremity weakness   Alcohol abuse   Normocytic anemia   Diarrhea   Hypomagnesemia   Hyponatremia   Folic acid deficiency   Encephalopathy       Acute metabolic encephalopathy Patient had delirium hallucinations initially thought to be metabolic in nature.  History of alcohol use at home.  MRI was negative.  He was empirically treated with IVIG for 5 days for GBS with lower extremity weakness and ganciclovir high-dose thiamine and folic acid.  Mentation has improved at this time.  CSF CMV is negative so ganciclovir has been discontinued on 08/14/2020.Marland Kitchen  Patient will be continued on thiamine and folic acid on   Alcohol abuse On the evening of 3/1 patient appeared to have further withdrawal symptoms and was given Ativan under CIWA protocol.  Subsequently CIWA was discontinued.  We will continue thiamine folic acid.  Patient is motivated to quit drinking.    Bilateral lower extremity weakness Neurology was consulted.  Could not rule out GBS so received 5 days of IVIG.  MRI of the cervical thoracic lumbar spine was unremarkable.  Continue thiamine and folic acid for now.  Weakness gradually improving. 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 physical therapy recommending CIR placement.   Folic acid deficiency Will be continued on folate supplements on discharge, initially received high-dose IV folic acid    HIV Non compliant to meds.  CD4 absolute count is 40, HIV RNA 104,000.  Patient was seen by ID.  Recommend Biktarvy and Bactrim on discharge.   Diarrhea -Patient had negative GI pathogen panel, CMV titer negative as well.  Overall improved.  Continue loperamide.   Normocytic anemia Folic acid levels low.  Continue to monitor closely.  Latest hemoglobin of 8.5. Iron stores adequate.Folate low, 3.4.Vitamin B12 726   Hyponatremia, mild Would need to monitor as outpatient.   Hypomagnesemia Likely from GI loss from diarrhea.  Continue to replenish with magnesium oxide.  Will receive 2 grams of magnesium sulfate IV prior to discharge.   Hypokalemia Improved after replacement.   Essential hypertension -Continue amlodipine and Coreg on discharge.   Generalized anxiety disorder -Continue Zoloft   Elevated troponin Likely nonspecific elevation secondary to anemia, hypokalemia demand ischemia.  No chest pain.  EKG showed sinus tachycardia.  Echo with no significant abnormality.  CT angiogram of the chest was negative for PE.  Cardiology recommended no  further work-up.       Inpatient rehab Admit date: 08/15/2020 Discharge date: 09/01/2020   Discharge Diagnoses:  Principal Problem:   Progressive focal motor weakness Active Problems:   Transaminitis   ETOH abuse   Sinus tachycardia   Essential hypertension   AIDS (HCC)   Anemia   Leukopenia   GBS (Guillain Barre syndrome) (HCC)   Gastroesophageal reflux disease   Brief HPI:   Harold Flores  is a 30 y.o. male with history of EtOH abuse, syphilis, HIV/AIDS who was admitted on 08/06/2020 with BLE weakness and difficulty walking, paresthesias, decreased appetite and loose stools as well as confusion with hallucinations.  He was found to have significant hyperkalemia with elevated troponins felt to be multifactorial due to LVH, tachycardia and anemia.  Work-up was suspicious for GBS and was treated with IVIG.  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  ID expressed concerns of advanced HIV AIDS with viral load of 104,000 and CD4 of 40 therefore Biktarvy added for treatment.  He did have improvement in lower extremity strength but continued to have instability with ataxia, balance deficits as well as waxing and waning of mentation.  CIR was recommended due to functional decline.Marland Kitchen     Hospital Course: Harold Flores was admitted to rehab 08/15/2020 for inpatient therapies to consist of PT and OT at least three hours five days a week. Past admission physiatrist, therapy team and rehab RN have worked together to provide customized collaborative inpatient rehab. Blood pressures were monitored on TID basis and have been well controlled on current regimen. He continues on Bitarvy and is tolerating this without SE. Mentation has improved and no signs of anxiety noted during his stay.  P.o. intake has improved and diarrhea has resolved.  Follow up magnesium levels remain low and he was supplemented with IV doses and was started on oral supplement during his stay. Anticipate that it will continue to improve with improvement in intake and abstinence from alcohol.     Team has provided ego support and his mood has been stable on Zoloft alone.  He has been educated on importance of medication compliance as well as avoidance of  alcohol and marijuana.  He continues on folic acid and thiamine for supplementation.  Follow-up CBC showed slow improvement in white count and H&H.  Check of BMET showed electrolytes and renal status to be within normal limits.  Bilateral peripherals were used at nights and he was fitted with bilateral AFO for foot drop to help with gait quality.  His spasticity has improved and pain is currently managed on gabapentin 3 times daily.  He has made steady progress during his stay and is currently at supervision level.  He has been educated on home exercise program as no home health available to provide charity care.     Rehab course: During patient's stay in rehab weekly team conferences were held to monitor patient's progress, set goals and discuss barriers to discharge. At admission, patient required mod assist with ADL tasks and with mobility. Speech therapy evaluation revealed cognitive linguistic skills to be WNL therefore not needed during his stay.  He has had improvement in activity tolerance, balance, postural control as well as ability to compensate for deficits. He is able to complete ADL tasks with supervision.  He is independent for transfers and  requires supervision to ambulate 300' with RW and cues. Family education was completed.      ED/Hospital notes reviewed.    1. Essential hypertension, benign Continue amlodipine and carvedilol.  He has RF on amlodipine - carvedilol (COREG) 12.5 MG tablet; TAKE 1 TABLET BY MOUTH TWICE PER DAY  Dispense: 60 tablet; Refill: 11   2. Peripheral edema He is not having to use this much - furosemide (LASIX) 20 MG tablet; TAKE 1 TABLET (20 MG TOTAL) BY MOUTH DAILY IN THE MORNING AS NEEDED FOR FLUID OR EDEMA.  Dispense: 30 tablet; Refill: 0   3. Folic acid deficiency - folic acid (FOLVITE) 1 MG tablet; TAKE 1 TABLET (1 MG TOTAL) BY MOUTH DAILY.  Dispense: 30 tablet; Refill: 3   4. Hypomagnesemia - Magnesium - magnesium oxide (MAG-OX) 400 MG tablet; TAKE  1 TABLET (500 MG TOTAL) BY MOUTH THREE TIMES DAILY.  Dispense: 90 tablet; Refill: 0   5. Symptomatic HIV infection (HCC) Followed by ID and on Biktarvy   6. Paresthesia of both lower extremities - gabapentin (NEURONTIN) 400 MG capsule; Take 1 capsule (400 mg total) by mouth 4 (four) times daily.  Dispense: 120 capsule; Refill: 3 - Ambulatory referral to Neurology   7. Hospital discharge follow-up   8. Hyponatremia Back to normal 4/7   9. Thiamine deficiency - thiamine 100 MG tablet; TAKE 1 TABLET (100 MG TOTAL) BY MOUTH DAILY.  Dispense: 90 tablet; Refill: 0 - Vitamin B1   10. GBS (Guillain Barre syndrome) (HCC) - Ambulatory referral to Neurology-has appt upcomong with Dr Allena KatzPAtel   Past Medical History:  Diagnosis Date   Anal fissure    Anal fistula    Anal ulcer    Asthma    Epistaxis    GAD (generalized anxiety disorder)    GERD (gastroesophageal reflux disease)    Hemorrhoid    HIV (human immunodeficiency virus infection) (HCC)    Hypertension    Syphilis    Varicella     Past Surgical History:  Procedure Laterality Date   EYE SURGERY  1994   blephroplasty right eye   RADIOLOGY WITH ANESTHESIA N/A 08/08/2020   Procedure: MRI WITH ANESTHESIA- BRAIN WITH AND WITHOUT CONTRAST,  CERVICAL SPINE WITH AND WITHOUT CONTRAST, LUMBAR SPINE WITH WITHOUT CONTRAST, THORACIC SPINE WITH WITHOUT CONTRAST;  Surgeon: Radiologist, Medication, MD;  Location: [facility] OR;  Service: Radiology;  Laterality: N/A;    Family History  Problem Relation Age of Onset   Diabetes Mother    Hyperlipidemia Mother    Diabetes Maternal Grandmother    Heart disease Paternal Grandfather    Cancer Neg Hx     Social History   Socioeconomic History   Marital status: Single    Spouse name: Not on file   Number of children: 0   Years of education: 13   Highest education level: Not on file  Occupational History   Occupation: [employer]  Tobacco Use   Smoking status: Every Day    Packs/day: 0.50     Years: 5.00    Pack years: 2.50    Types: E-cigarettes, Cigarettes   Smokeless tobacco: Never  Substance and Sexual Activity   Alcohol use: Yes    Alcohol/week: 7.0 standard drinks    Types: 7 Cans of beer per week    Comment: Rarely   Drug use: Not Currently    Types: Marijuana   Sexual activity: Yes    Partners: Male    Birth control/protection: Condom    Comment:  DECLINED CONDOMS  Other Topics Concern   Not on file  Social History Narrative   Lives at home   A student   architectural games on computer   Fun: Music   Denies religious beliefs that would effect health care.    Social Determinants of Health   Financial Resource Strain: Not on file  Food Insecurity: Not on file  Transportation Needs: Not on file  Physical Activity: Not on file  Stress: Not on file  Social Connections: Not on file  Intimate Partner Violence: Not on file    ROS Review of Systems  Objective:   Today's Vitals: There were no vitals taken for this visit.  Physical Exam  Assessment & Plan:   Problem List Items Addressed This Visit   None   Outpatient Encounter Medications as of 01/09/2021  Medication Sig   amLODipine (NORVASC) 10 MG tablet TAKE 1 TABLET (10 MG TOTAL) BY MOUTH DAILY.   bictegravir-emtricitabine-tenofovir AF (BIKTARVY) 50-200-25 MG TABS tablet TAKE 1 TABLET BY MOUTH DAILY.   carvedilol (COREG) 12.5 MG tablet TAKE 1 TABLET BY MOUTH TWICE PER DAY   folic acid (FOLVITE) 1 MG tablet TAKE 1 TABLET (1 MG TOTAL) BY MOUTH DAILY.   furosemide (LASIX) 20 MG tablet TAKE 1 TABLET (20 MG TOTAL) BY MOUTH DAILY IN THE MORNING AS NEEDED FOR FLUID OR EDEMA.   gabapentin (NEURONTIN) 400 MG capsule Take 1 capsule (400 mg total) by mouth 4 (four) times daily.   loperamide (IMODIUM) 2 MG capsule TAKE 1 CAPSULE (2 MG TOTAL) BY MOUTH AS NEEDED FOR DIARRHEA OR LOOSE STOOLS. (Patient not taking: Reported on 12/20/2020)   magnesium oxide (MAG-OX) 400 MG tablet TAKE 1 TABLET (500 MG TOTAL) BY MOUTH  THREE TIMES DAILY. (Patient not taking: Reported on 12/20/2020)   Multiple Vitamin (MULTIVITAMIN) tablet Take 1 tablet by mouth daily.   nicotine polacrilex (NICORETTE) 2 MG gum TAKE 1 EACH (2 MG TOTAL) BY MOUTH AS NEEDED FOR SMOKING CESSATION. (Patient not taking: Reported on 12/20/2020)   sertraline (ZOLOFT) 50 MG tablet Take 3 tablets by mouth daily to equal 150 mg dosage   sulfamethoxazole-trimethoprim (BACTRIM DS) 800-160 MG tablet TAKE 1 TABLET BY MOUTH DAILY.   thiamine 100 MG tablet TAKE 1 TABLET (100 MG TOTAL) BY MOUTH DAILY.   No facility-administered encounter medications on file as of 01/09/2021.    Follow-up: No follow-ups on file.   Shan Levans, MD

## 2021-01-18 ENCOUNTER — Encounter: Payer: Medicaid Other | Attending: Physical Medicine & Rehabilitation | Admitting: Physical Medicine & Rehabilitation

## 2021-01-29 ENCOUNTER — Encounter: Payer: Self-pay | Admitting: Infectious Disease

## 2021-02-05 ENCOUNTER — Other Ambulatory Visit: Payer: Self-pay | Admitting: Internal Medicine

## 2021-02-05 DIAGNOSIS — I10 Essential (primary) hypertension: Secondary | ICD-10-CM

## 2021-02-05 NOTE — Telephone Encounter (Signed)
Please refill as per office routine med refill policy (all routine meds to be refilled for 3 mo or monthly (per pt preference) up to one year from last visit, then month to month grace period for 3 mo, then further med refills will have to be denied) ? ?

## 2021-02-26 ENCOUNTER — Other Ambulatory Visit: Payer: Self-pay

## 2021-03-05 ENCOUNTER — Telehealth: Payer: Self-pay

## 2021-03-05 ENCOUNTER — Other Ambulatory Visit: Payer: Self-pay | Admitting: Infectious Disease

## 2021-03-05 MED ORDER — SERTRALINE HCL 50 MG PO TABS
50.0000 mg | ORAL_TABLET | Freq: Every day | ORAL | 5 refills | Status: DC
Start: 2021-03-05 — End: 2022-03-26

## 2021-03-05 MED ORDER — SERTRALINE HCL 100 MG PO TABS: 100.0000 mg | ORAL_TABLET | Freq: Every day | ORAL | 6 refills | Status: DC

## 2021-03-05 NOTE — Telephone Encounter (Signed)
Received notification from [facility] that 3 tablets of sertraline is not covered by patient's medicaid. Patient will need new prescription sent for 1 tablet daily or for 100mg  tablet to be taken with one 50 mg tablet daily to equal 150mg  (total of two tablets). Will route to provider.   , RN

## 2021-03-28 ENCOUNTER — Other Ambulatory Visit (HOSPITAL_COMMUNITY): Payer: Self-pay

## 2021-03-28 ENCOUNTER — Other Ambulatory Visit (HOSPITAL_COMMUNITY)
Admission: RE | Admit: 2021-03-28 | Discharge: 2021-03-28 | Disposition: A | Discharge status: Home / Self Care | Payer: Medicaid Other | Source: Ambulatory Visit | Attending: Infectious Disease | Admitting: Infectious Disease

## 2021-03-28 ENCOUNTER — Other Ambulatory Visit: Payer: Self-pay

## 2021-03-28 ENCOUNTER — Encounter: Payer: Self-pay | Admitting: Infectious Disease

## 2021-03-28 ENCOUNTER — Ambulatory Visit (INDEPENDENT_AMBULATORY_CARE_PROVIDER_SITE_OTHER): Payer: Medicaid Other

## 2021-03-28 ENCOUNTER — Ambulatory Visit (INDEPENDENT_AMBULATORY_CARE_PROVIDER_SITE_OTHER): Payer: Medicaid Other | Admitting: Infectious Disease

## 2021-03-28 VITALS — BP 143/101 | HR 85 | Temp 99.3°F | Ht 70.0 in | Wt 200.0 lb

## 2021-03-28 DIAGNOSIS — F1010 Alcohol abuse, uncomplicated: Secondary | ICD-10-CM | POA: Diagnosis not present

## 2021-03-28 DIAGNOSIS — B20 Human immunodeficiency virus [HIV] disease: Secondary | ICD-10-CM

## 2021-03-28 DIAGNOSIS — Z23 Encounter for immunization: Secondary | ICD-10-CM

## 2021-03-28 DIAGNOSIS — G610 Guillain-Barre syndrome: Secondary | ICD-10-CM

## 2021-03-28 DIAGNOSIS — M21372 Foot drop, left foot: Secondary | ICD-10-CM | POA: Diagnosis not present

## 2021-03-28 DIAGNOSIS — M21371 Foot drop, right foot: Secondary | ICD-10-CM | POA: Diagnosis not present

## 2021-03-28 DIAGNOSIS — A530 Latent syphilis, unspecified as early or late: Secondary | ICD-10-CM

## 2021-03-28 DIAGNOSIS — E538 Deficiency of other specified B group vitamins: Secondary | ICD-10-CM

## 2021-03-28 MED ORDER — SULFAMETHOXAZOLE-TRIMETHOPRIM 800-160 MG PO TABS
1.0000 | ORAL_TABLET | Freq: Every day | ORAL | 5 refills | Status: DC
  Filled 2021-03-28: qty 30, 30d supply, fill #0

## 2021-03-28 MED ORDER — BICTEGRAVIR-EMTRICITAB-TENOFOV 50-200-25 MG PO TABS
1.0000 | ORAL_TABLET | Freq: Every day | ORAL | 11 refills | Status: DC
  Filled 2021-03-28 (×2): qty 30, 30d supply, fill #0

## 2021-03-28 NOTE — Progress Notes (Signed)
   Covid-19 Vaccination Clinic  Name:  Harold Flores    MRN: [id number] DOB: 01-24-1991  03/28/2021  [name] was observed post Covid-19 immunization for 15 minutes without incident. He was provided with Vaccine Information Sheet and instruction to access the V-Safe system.   [name] was instructed to call 911 with any severe reactions post vaccine: Difficulty breathing  Swelling of face and throat  A fast heartbeat  A bad rash all over body  Dizziness and weakness     Sandie Ano, RN

## 2021-03-28 NOTE — Progress Notes (Signed)
Subjective:   Chief complaint: Follow-up for HIV disease complaining of weight gain   Patient ID: Harold Flores, male    DOB: 28-Nov-1990, 30 y.o.   MRN: [id number]  HPI  30 year old Black man living with HIV/AIDS who had been out of care and presented with loss of strength in his lower extremities gait instability numbness in his hands encephalopathy with waxing and waning neurological status.  There was  initially concern for possible opportunistic infection and he underwent lumbar puncture which did not reveal an opportunistic infection.  He was treated for Guillain-Barr syndrome with IVIG and corticosteroids.  He was also found to be deficient of folate and B12 in the context of severe alcoholism.  He was treated with replenishment of these vitamins.  He was restarted on antiretroviral therapy in the form of Biktarvy.  His initial viral load was greater than 100,000 copies.  CD4 count was only 40 initially but rose to over 100 while he was in the hospital he ultimately improved sufficiently to be transferred to inpatient rehab where he stayed until last week when he was discharged in the hospital.  He unfortunately had problems with relapse of alcoholism and heavy drinking which his mother had brought to my attention in several visits ago.  He currently is doing much better he does still drink at least 2 beers per day and then more when he has a day off but this is a dramatic improvement for him.  He is now employed and working full-time.  He appears to have Medicaid but is concerned he may lose it.  He is not living with his mother but with his grandmother at present.  It sounds as if he would likely move back with his mother if that is possible to allow him to do further monitored walking.  Mom is also asking about something to help with his foot drop such as a brace.  I also inquired about a case Production designer, theatre/television/film and assistance with housing.  Harold Flores has noticed a dramatic weight gain  with at least 40 pounds of weight gain since he has been on BIKTARVY.  He previously gained some weight with TRIUMEQ but not to the extent that he has gained on BIKTARVY.         Past Medical History:  Diagnosis Date   Anal fissure    Anal fistula    Anal ulcer    Asthma    Epistaxis    GAD (generalized anxiety disorder)    GERD (gastroesophageal reflux disease)    Hemorrhoid    HIV (human immunodeficiency virus infection) (HCC)    Hypertension    Syphilis    Varicella     Past Surgical History:  Procedure Laterality Date   EYE SURGERY  1994   blephroplasty right eye   RADIOLOGY WITH ANESTHESIA N/A 08/08/2020   Procedure: MRI WITH ANESTHESIA- BRAIN WITH AND WITHOUT CONTRAST,  CERVICAL SPINE WITH AND WITHOUT CONTRAST, LUMBAR SPINE WITH WITHOUT CONTRAST, THORACIC SPINE WITH WITHOUT CONTRAST;  Surgeon: Radiologist, Medication, MD;  Location: [facility] OR;  Service: Radiology;  Laterality: N/A;    Family History  Problem Relation Age of Onset   Diabetes Mother    Hyperlipidemia Mother    Diabetes Maternal Grandmother    Heart disease Paternal Grandfather    Cancer Neg Hx       Social History   Socioeconomic History   Marital status: Single    Spouse name: Not on file   Number of  children: 0   Years of education: 13   Highest education level: Not on file  Occupational History   Occupation: [employer]  Tobacco Use   Smoking status: Every Day    Packs/day: 0.50    Years: 5.00    Pack years: 2.50    Types: E-cigarettes, Cigarettes   Smokeless tobacco: Never  Substance and Sexual Activity   Alcohol use: Yes    Alcohol/week: 7.0 standard drinks    Types: 7 Cans of beer per week    Comment: Rarely   Drug use: Not Currently    Types: Marijuana   Sexual activity: Yes    Partners: Male    Birth control/protection: Condom    Comment: DECLINED CONDOMS  Other Topics Concern   Not on file  Social History Narrative   Lives at home   A student   architectural games on  computer   Fun: Music   Denies religious beliefs that would effect health care.    Social Determinants of Health   Financial Resource Strain: Not on file  Food Insecurity: Not on file  Transportation Needs: Not on file  Physical Activity: Not on file  Stress: Not on file  Social Connections: Not on file    No Known Allergies   Current Outpatient Medications:    amLODipine (NORVASC) 10 MG tablet, TAKE 1 TABLET (10 MG TOTAL) BY MOUTH DAILY., Disp: 30 tablet, Rfl: 11   bictegravir-emtricitabine-tenofovir AF (BIKTARVY) 50-200-25 MG TABS tablet, TAKE 1 TABLET BY MOUTH DAILY., Disp: 30 tablet, Rfl: 11   carvedilol (COREG) 12.5 MG tablet, TAKE 1 TABLET BY MOUTH TWICE A DAY, Disp: 60 tablet, Rfl: 2   folic acid (FOLVITE) 1 MG tablet, TAKE 1 TABLET (1 MG TOTAL) BY MOUTH DAILY., Disp: 30 tablet, Rfl: 3   furosemide (LASIX) 20 MG tablet, TAKE 1 TABLET (20 MG TOTAL) BY MOUTH DAILY IN THE MORNING AS NEEDED FOR FLUID OR EDEMA., Disp: 30 tablet, Rfl: 0   gabapentin (NEURONTIN) 400 MG capsule, Take 1 capsule (400 mg total) by mouth 4 (four) times daily., Disp: 120 capsule, Rfl: 3   loperamide (IMODIUM) 2 MG capsule, TAKE 1 CAPSULE (2 MG TOTAL) BY MOUTH AS NEEDED FOR DIARRHEA OR LOOSE STOOLS. (Patient not taking: Reported on 12/20/2020), Disp: 30 capsule, Rfl: 0   magnesium oxide (MAG-OX) 400 MG tablet, TAKE 1 TABLET (500 MG TOTAL) BY MOUTH THREE TIMES DAILY. (Patient not taking: Reported on 12/20/2020), Disp: 90 tablet, Rfl: 0   Multiple Vitamin (MULTIVITAMIN) tablet, Take 1 tablet by mouth daily., Disp: , Rfl:    nicotine polacrilex (NICORETTE) 2 MG gum, TAKE 1 EACH (2 MG TOTAL) BY MOUTH AS NEEDED FOR SMOKING CESSATION. (Patient not taking: Reported on 12/20/2020), Disp: 100 each, Rfl: 0   sertraline (ZOLOFT) 100 MG tablet, Take 1 tablet (100 mg total) by mouth daily. Take with 50mg  tablet for total of 150mg , Disp: 30 tablet, Rfl: 6   sertraline (ZOLOFT) 50 MG tablet, Take 1 tablet (50 mg total) by mouth  daily. Take with 100mg  for total of 150mg , Disp: 30 tablet, Rfl: 5   sulfamethoxazole-trimethoprim (BACTRIM DS) 800-160 MG tablet, TAKE 1 TABLET BY MOUTH DAILY., Disp: 30 tablet, Rfl: 5   thiamine 100 MG tablet, TAKE 1 TABLET (100 MG TOTAL) BY MOUTH DAILY., Disp: 90 tablet, Rfl: 0   Review of Systems  Constitutional:  Positive for unexpected weight change. Negative for activity change, appetite change, chills, diaphoresis, fatigue and fever.  HENT:  Negative for congestion, rhinorrhea, sinus  pressure, sneezing, sore throat and trouble swallowing.   Eyes:  Negative for photophobia and visual disturbance.  Respiratory:  Negative for cough, chest tightness, shortness of breath, wheezing and stridor.   Cardiovascular:  Negative for chest pain, palpitations and leg swelling.  Gastrointestinal:  Negative for abdominal distention, abdominal pain, anal bleeding, blood in stool, constipation, diarrhea, nausea and vomiting.  Genitourinary:  Negative for difficulty urinating, dysuria, flank pain and hematuria.  Musculoskeletal:  Negative for arthralgias, back pain, gait problem, joint swelling and myalgias.  Skin:  Negative for color change, pallor, rash and wound.  Neurological:  Positive for weakness. Negative for dizziness, tremors and light-headedness.  Hematological:  Negative for adenopathy. Does not bruise/bleed easily.  Psychiatric/Behavioral:  Negative for agitation, behavioral problems, confusion, decreased concentration, dysphoric mood and sleep disturbance.       Objective:   Physical Exam Constitutional:      Appearance: He is well-developed.  HENT:     Head: Normocephalic and atraumatic.  Eyes:     Conjunctiva/sclera: Conjunctivae normal.  Cardiovascular:     Rate and Rhythm: Normal rate and regular rhythm.  Pulmonary:     Effort: Pulmonary effort is normal. No respiratory distress.     Breath sounds: No wheezing.  Abdominal:     General: There is no distension.     Palpations:  Abdomen is soft.  Musculoskeletal:        General: No tenderness. Normal range of motion.     Cervical back: Normal range of motion and neck supple.  Skin:    General: Skin is warm and dry.     Coloration: Skin is not pale.     Findings: No erythema or rash.  Neurological:     Mental Status: He is alert and oriented to person, place, and time.  Psychiatric:        Mood and Affect: Mood normal.        Behavior: Behavior normal.        Thought Content: Thought content normal.        Judgment: Judgment normal.          Assessment & Plan:   HIV disease: I am checking HIV viral load and a CD4 count.  Will continue Biktarvy for now along with Bactrim.  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  He has a residual foot drop I am referring him to physical medicine and rehabilitation.  Housing issues: We will see if try to help project and help him with this.  Alcoholism: Is not completely sober but he is doing much better than he was before we will check liver function test today.  Vitamin deficiencies we will check his folate and B12 again and thiamine.  Seen counseling we gave him influenza shot as well as COVID-19 updated bivalent booster.

## 2021-03-29 ENCOUNTER — Telehealth: Payer: Self-pay

## 2021-03-29 LAB — T-HELPER CELL (CD4) - (RCID CLINIC ONLY)
CD4 % Helper T Cell: 17 % — ABNORMAL LOW (ref 33–65)
CD4 T Cell Abs: 179 /uL — ABNORMAL LOW (ref 400–1790)

## 2021-03-29 LAB — URINE CYTOLOGY ANCILLARY ONLY
Chlamydia: NEGATIVE
Comment: NEGATIVE
Comment: NORMAL
Neisseria Gonorrhea: NEGATIVE

## 2021-03-29 NOTE — Telephone Encounter (Signed)
-----   Message from Randall Hiss, MD sent at 03/29/2021 10:44 AM EDT ----- LFts a bit up from alcohol. If he can cut back further that would be for the best re his liver ----- Message ----- From: Interface, [facility] Results In Sent: 03/28/2021  11:01 PM EDT To: Randall Hiss, MD

## 2021-03-29 NOTE — Telephone Encounter (Signed)
Attempted to call patient no answer

## 2021-03-30 ENCOUNTER — Telehealth: Payer: Self-pay

## 2021-03-30 NOTE — Telephone Encounter (Signed)
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 He should receive PCN 2.4 MU weekly x 3 weeks ----- Message ----- From: Interface, [facility] Results In Sent: 03/28/2021  11:01 PM EDT To: Randall Hiss, MD

## 2021-03-30 NOTE — Telephone Encounter (Signed)
RN attempted to call patient however voicemail message is for a woman. Did not leave message. RN will send a Mychart message following up on syphilis results and request a return call.   Kyriaki Moder Loyola Mast, RN

## 2021-04-02 LAB — CBC WITH DIFFERENTIAL/PLATELET
Absolute Monocytes: 862 cells/uL (ref 200–950)
Basophils Absolute: 28 cells/uL (ref 0–200)
Basophils Relative: 0.5 %
Eosinophils Absolute: 78 cells/uL (ref 15–500)
Eosinophils Relative: 1.4 %
HCT: 45.6 % (ref 38.5–50.0)
Hemoglobin: 15.3 g/dL (ref 13.2–17.1)
Lymphs Abs: 1154 cells/uL (ref 850–3900)
MCH: 29.2 pg (ref 27.0–33.0)
MCHC: 33.6 g/dL (ref 32.0–36.0)
MCV: 87 fL (ref 80.0–100.0)
MPV: 10 fL (ref 7.5–12.5)
Monocytes Relative: 15.4 %
Neutro Abs: 3478 cells/uL (ref 1500–7800)
Neutrophils Relative %: 62.1 %
Platelets: 389 10*3/uL (ref 140–400)
RBC: 5.24 10*6/uL (ref 4.20–5.80)
RDW: 16.1 % — ABNORMAL HIGH (ref 11.0–15.0)
Total Lymphocyte: 20.6 %
WBC: 5.6 10*3/uL (ref 3.8–10.8)

## 2021-04-02 LAB — COMPLETE METABOLIC PANEL WITH GFR
AG Ratio: 1.4 (calc) (ref 1.0–2.5)
ALT: 36 U/L (ref 9–46)
AST: 68 U/L — ABNORMAL HIGH (ref 10–40)
Albumin: 4.6 g/dL (ref 3.6–5.1)
Alkaline phosphatase (APISO): 137 U/L — ABNORMAL HIGH (ref 36–130)
BUN: 8 mg/dL (ref 7–25)
CO2: 29 mmol/L (ref 20–32)
Calcium: 9.8 mg/dL (ref 8.6–10.3)
Chloride: 97 mmol/L — ABNORMAL LOW (ref 98–110)
Creat: 1 mg/dL (ref 0.60–1.24)
Globulin: 3.4 g/dL (calc) (ref 1.9–3.7)
Glucose, Bld: 82 mg/dL (ref 65–99)
Potassium: 4.5 mmol/L (ref 3.5–5.3)
Sodium: 136 mmol/L (ref 135–146)
Total Bilirubin: 0.6 mg/dL (ref 0.2–1.2)
Total Protein: 8 g/dL (ref 6.1–8.1)
eGFR: 104 mL/min/{1.73_m2} (ref >=60)

## 2021-04-02 LAB — LIPID PANEL
Cholesterol: 135 mg/dL (ref <200)
HDL: 61 mg/dL (ref >=40)
LDL Cholesterol (Calc): 56 mg/dL (calc)
Non-HDL Cholesterol (Calc): 74 mg/dL (calc) (ref <130)
Total CHOL/HDL Ratio: 2.2 (calc) (ref <5.0)
Triglycerides: 101 mg/dL (ref <150)

## 2021-04-02 LAB — B12 AND FOLATE PANEL
Folate: 9.4 ng/mL
Vitamin B-12: 487 pg/mL (ref 200–1100)

## 2021-04-02 LAB — RPR: RPR Ser Ql: REACTIVE — AB

## 2021-04-02 LAB — VITAMIN B1: Vitamin B1 (Thiamine): 6 nmol/L — ABNORMAL LOW (ref 8–30)

## 2021-04-02 LAB — FLUORESCENT TREPONEMAL AB(FTA)-IGG-BLD: Fluorescent Treponemal ABS: REACTIVE — AB

## 2021-04-02 LAB — HIV-1 RNA QUANT-NO REFLEX-BLD
HIV 1 RNA Quant: NOT DETECTED Copies/mL
HIV-1 RNA Quant, Log: NOT DETECTED Log cps/mL

## 2021-04-02 LAB — RPR TITER: RPR Titer: 1:8 {titer} — ABNORMAL HIGH

## 2021-04-02 NOTE — Telephone Encounter (Signed)
Attempted to call patient again, no answer and unable to leave voicemail. Multiple calls have been made to patient along with multiple MyChart messages without success. Referral faxed to [facility] for treatment of syphilis.   Sandie Ano, RN

## 2021-04-05 ENCOUNTER — Other Ambulatory Visit (HOSPITAL_COMMUNITY): Payer: Self-pay

## 2021-04-12 NOTE — Telephone Encounter (Signed)
Spoke with patient and relayed positive syphilis test. Advised patient no sex until treatment is completed plus an additional 10 days and instructed to notify sexual partners for testing and treatment. Patient verbalized understanding and has no further questions.   Scheduled for first dose of Bicillin 04/16/21.    Sandie Ano, RN

## 2021-04-12 NOTE — Telephone Encounter (Signed)
Spoke to patient, relayed that his LFTs were elevated and advised to cut back on alcohol to see if this helps improve liver tests. Patient verbalized understanding and has no further questions.    Sandie Ano, RN

## 2021-04-16 ENCOUNTER — Ambulatory Visit (INDEPENDENT_AMBULATORY_CARE_PROVIDER_SITE_OTHER): Payer: Medicaid Other

## 2021-04-16 ENCOUNTER — Other Ambulatory Visit: Payer: Self-pay

## 2021-04-16 DIAGNOSIS — A539 Syphilis, unspecified: Secondary | ICD-10-CM | POA: Diagnosis present

## 2021-04-16 MED ORDER — PENICILLIN G BENZATHINE 1200000 UNIT/2ML IM SUSY
1.2000 10*6.[IU] | PREFILLED_SYRINGE | Freq: Once | INTRAMUSCULAR | Status: AC
  Administered 2021-04-16: 1.2 10*6.[IU] via INTRAMUSCULAR

## 2021-04-16 NOTE — Progress Notes (Signed)
Reviewed and verified allergies with patient. Patient tolerated Bicillin injections well. Reinforced abstinence until treatment completed plus an additional 10 days, offered condoms and encouraged use. Advised patient to notify sexual partners for testing and treatment. Patient verbalized understanding.    Spoke with Barbara Cower at [facility] who confirms they were unable to get in touch with patient for treatment.   Sandie Ano, RN

## 2021-04-18 ENCOUNTER — Other Ambulatory Visit (HOSPITAL_COMMUNITY): Payer: Self-pay

## 2021-04-23 ENCOUNTER — Other Ambulatory Visit (HOSPITAL_COMMUNITY): Payer: Self-pay

## 2021-04-24 ENCOUNTER — Ambulatory Visit: Payer: Medicaid Other

## 2021-04-24 ENCOUNTER — Telehealth: Payer: Self-pay

## 2021-04-24 NOTE — Telephone Encounter (Signed)
Patient missed second dose of Bicillin today, called to reschedule. No answer and voicemail belonged to a male, did not leave message.   Sandie Ano, RN

## 2021-05-01 ENCOUNTER — Ambulatory Visit: Payer: Medicaid Other

## 2021-05-13 ENCOUNTER — Other Ambulatory Visit (HOSPITAL_COMMUNITY): Payer: Self-pay

## 2021-05-22 ENCOUNTER — Telehealth: Payer: Self-pay

## 2021-05-22 ENCOUNTER — Other Ambulatory Visit: Payer: Self-pay | Admitting: Internal Medicine

## 2021-05-22 DIAGNOSIS — I10 Essential (primary) hypertension: Secondary | ICD-10-CM

## 2021-05-22 NOTE — Telephone Encounter (Signed)
Received call from [facility] as patient did not return to the office for x2 more weekly doses of Bicillin 1.2 Units. Patient came for 1st dose of treatment on 04/16/21 and did not return for 2nd/3rd doses. Per [facility] patient will contact our office before 10am tomorrow to schedule x3 weekly visits. [name]

## 2021-05-22 NOTE — Telephone Encounter (Signed)
Ok for HTN - I10

## 2021-05-22 NOTE — Telephone Encounter (Signed)
Please refill as per office routine med refill policy (all routine meds to be refilled for 3 mo or monthly (per pt preference) up to one year from last visit, then month to month grace period for 3 mo, then further med refills will have to be denied) ? ?

## 2021-05-23 ENCOUNTER — Other Ambulatory Visit (HOSPITAL_COMMUNITY): Payer: Self-pay

## 2021-05-23 ENCOUNTER — Ambulatory Visit: Payer: Medicaid Other | Admitting: Infectious Disease

## 2021-05-23 NOTE — Telephone Encounter (Signed)
Attempted to call patient due to missed appointment. Patient also needs tx for syphilis.  Will send patient a Mychart message to contact our office.  [name]

## 2021-05-28 ENCOUNTER — Other Ambulatory Visit (HOSPITAL_COMMUNITY): Payer: Self-pay

## 2021-05-31 ENCOUNTER — Other Ambulatory Visit (HOSPITAL_COMMUNITY): Payer: Self-pay

## 2021-05-31 NOTE — Telephone Encounter (Signed)
Patient has not seen Mychart messages or responded to voice message to return calls. Sent patient information to [facility] for tracking.  Harold Flores

## 2021-06-05 ENCOUNTER — Other Ambulatory Visit: Payer: Self-pay | Admitting: Internal Medicine

## 2021-06-05 DIAGNOSIS — I10 Essential (primary) hypertension: Secondary | ICD-10-CM

## 2021-07-03 ENCOUNTER — Encounter: Payer: Self-pay | Admitting: Infectious Disease

## 2021-07-03 ENCOUNTER — Other Ambulatory Visit: Payer: Self-pay

## 2021-07-03 ENCOUNTER — Other Ambulatory Visit (HOSPITAL_COMMUNITY)
Admission: RE | Admit: 2021-07-03 | Discharge: 2021-07-03 | Disposition: A | Discharge status: Home / Self Care | Payer: Medicaid Other | Source: Ambulatory Visit | Attending: Infectious Disease | Admitting: Infectious Disease

## 2021-07-03 ENCOUNTER — Ambulatory Visit (INDEPENDENT_AMBULATORY_CARE_PROVIDER_SITE_OTHER): Payer: Medicaid Other | Admitting: Infectious Disease

## 2021-07-03 VITALS — BP 147/107 | HR 83 | Temp 97.3°F | Wt 194.0 lb

## 2021-07-03 DIAGNOSIS — A5601 Chlamydial cystitis and urethritis: Secondary | ICD-10-CM | POA: Diagnosis present

## 2021-07-03 DIAGNOSIS — B20 Human immunodeficiency virus [HIV] disease: Secondary | ICD-10-CM

## 2021-07-03 DIAGNOSIS — A530 Latent syphilis, unspecified as early or late: Secondary | ICD-10-CM | POA: Insufficient documentation

## 2021-07-03 MED ORDER — PENICILLIN G BENZATHINE 1200000 UNIT/2ML IM SUSY
1.2000 10*6.[IU] | PREFILLED_SYRINGE | Freq: Once | INTRAMUSCULAR | Status: AC
  Administered 2021-07-03: 16:00:00 1.2 10*6.[IU] via INTRAMUSCULAR

## 2021-07-03 NOTE — Progress Notes (Addendum)
Subjective:   Chief complaint: Follow-up for HIV disease on medications   Patient ID: Harold Flores, male    DOB: [birth year], 31 y.o.   MRN: [id number]  HPI  31 year old 57 man living with HIV/AIDS who had been out of care and presented with loss of strength in his lower extremities gait instability numbness in his hands encephalopathy with waxing and waning neurological status.  There was  initially concern for possible opportunistic infection and he underwent lumbar puncture which did not reveal an opportunistic infection.  He was treated for Guillain-Barr syndrome with IVIG and corticosteroids.  He was also found to be deficient of folate and B12 in the context of severe alcoholism.  He was treated with replenishment of these vitamins.  He was restarted on antiretroviral therapy in the form of Biktarvy.  His initial viral load was greater than 100,000 copies.  CD4 count was only 40 initially but rose to over 100 while he was in the hospital he ultimately improved sufficiently to be transferred to inpatient rehab where he stayed until last week when he was discharged in the hospital.  He unfortunately had problems with relapse of alcoholism and heavy drinking which his mother had brought to my attention in several visits ago.   Harold Flores had reduce his alcohol intake.  He is still now living with his mother.  He and his "boo" have broken up but he is coping well with this. He does dislike how much weight he has gained since being on BIKTARVY.  He says that he feels like this medication makes him hungry.         Past Medical History:  Diagnosis Date   Anal fissure    Anal fistula    Anal ulcer    Asthma    Epistaxis    GAD (generalized anxiety disorder)    GERD (gastroesophageal reflux disease)    Hemorrhoid    HIV (human immunodeficiency virus infection) ([place])    Hypertension    Syphilis    Varicella     Past Surgical History:  Procedure Laterality Date    EYE SURGERY  1994   blephroplasty right eye   RADIOLOGY WITH ANESTHESIA N/A 08/08/2020   Procedure: MRI WITH ANESTHESIA- BRAIN WITH AND WITHOUT CONTRAST,  CERVICAL SPINE WITH AND WITHOUT CONTRAST, LUMBAR SPINE WITH WITHOUT CONTRAST, THORACIC SPINE WITH WITHOUT CONTRAST;  Surgeon: Radiologist, Medication, MD;  Location: [facility];  Service: Radiology;  Laterality: N/A;    Family History  Problem Relation Age of Onset   Diabetes Mother    Hyperlipidemia Mother    Diabetes Maternal Grandmother    Heart disease Paternal Grandfather    Cancer Neg Hx       Social History   Socioeconomic History   Marital status: Single    Spouse name: Not on file   Number of children: 0   Years of education: 13   Highest education level: Not on file  Occupational History   Occupation: [employer]  Tobacco Use   Smoking status: Every Day    Packs/day: 0.50    Years: 5.00    Pack years: 2.50    Types: E-cigarettes, Cigarettes   Smokeless tobacco: Never  Substance and Sexual Activity   Alcohol use: Yes    Alcohol/week: 7.0 standard drinks    Types: 7 Cans of beer per week    Comment: occ   Drug use: Not Currently    Types: Marijuana    Comment: occ  Sexual activity: Yes    Partners: Male    Birth control/protection: Condom    Comment: DECLINED CONDOMS  Other Topics Concern   Not on file  Social History Narrative   Lives at home   A student   architectural games on computer   Fun: Music   Denies religious beliefs that would effect health care.    Social Determinants of Health   Financial Resource Strain: Not on file  Food Insecurity: Not on file  Transportation Needs: Not on file  Physical Activity: Not on file  Stress: Not on file  Social Connections: Not on file    No Known Allergies   Current Outpatient Medications:    amLODipine (NORVASC) 10 MG tablet, TAKE 1 TABLET (10 MG TOTAL) BY MOUTH DAILY., Disp: 30 tablet, Rfl: 11   bictegravir-emtricitabine-tenofovir AF (BIKTARVY)  50-200-25 MG TABS tablet, TAKE 1 TABLET BY MOUTH DAILY., Disp: 30 tablet, Rfl: 11   carvedilol (COREG) 12.5 MG tablet, TAKE 1 TABLET BY MOUTH 2 (TWO) TIMES DAILY. MUST KEEP SCHEDULED APPT FOR FUTURE REFILLS, Disp: 60 tablet, Rfl: 1   furosemide (LASIX) 20 MG tablet, TAKE 1 TABLET (20 MG TOTAL) BY MOUTH DAILY IN THE MORNING AS NEEDED FOR FLUID OR EDEMA., Disp: 30 tablet, Rfl: 0   gabapentin (NEURONTIN) 400 MG capsule, Take 1 capsule (400 mg total) by mouth 4 (four) times daily., Disp: 120 capsule, Rfl: 3   Multiple Vitamin (MULTIVITAMIN) tablet, Take 1 tablet by mouth daily., Disp: , Rfl:    sertraline (ZOLOFT) 100 MG tablet, Take 1 tablet (100 mg total) by mouth daily. Take with 50mg  tablet for total of 150mg , Disp: 30 tablet, Rfl: 6   sertraline (ZOLOFT) 50 MG tablet, Take 1 tablet (50 mg total) by mouth daily. Take with 100mg  for total of 150mg , Disp: 30 tablet, Rfl: 5   sulfamethoxazole-trimethoprim (BACTRIM DS) 800-160 MG tablet, TAKE 1 TABLET BY MOUTH DAILY., Disp: 30 tablet, Rfl: 5   folic acid (FOLVITE) 1 MG tablet, TAKE 1 TABLET (1 MG TOTAL) BY MOUTH DAILY. (Patient not taking: Reported on 03/28/2021), Disp: 30 tablet, Rfl: 3   loperamide (IMODIUM) 2 MG capsule, TAKE 1 CAPSULE (2 MG TOTAL) BY MOUTH AS NEEDED FOR DIARRHEA OR LOOSE STOOLS. (Patient not taking: Reported on 12/20/2020), Disp: 30 capsule, Rfl: 0   magnesium oxide (MAG-OX) 400 MG tablet, TAKE 1 TABLET (500 MG TOTAL) BY MOUTH THREE TIMES DAILY. (Patient not taking: Reported on 12/20/2020), Disp: 90 tablet, Rfl: 0   nicotine polacrilex (NICORETTE) 2 MG gum, TAKE 1 EACH (2 MG TOTAL) BY MOUTH AS NEEDED FOR SMOKING CESSATION. (Patient not taking: Reported on 12/20/2020), Disp: 100 each, Rfl: 0   thiamine 100 MG tablet, TAKE 1 TABLET (100 MG TOTAL) BY MOUTH DAILY. (Patient not taking: Reported on 03/28/2021), Disp: 90 tablet, Rfl: 0   Review of Systems  Constitutional:  Negative for activity change, appetite change, chills, diaphoresis,  fatigue, fever and unexpected weight change.  HENT:  Negative for congestion, rhinorrhea, sinus pressure, sneezing, sore throat and trouble swallowing.   Eyes:  Negative for photophobia and visual disturbance.  Respiratory:  Negative for cough, chest tightness, shortness of breath, wheezing and stridor.   Cardiovascular:  Negative for chest pain, palpitations and leg swelling.  Gastrointestinal:  Negative for abdominal distention, abdominal pain, anal bleeding, blood in stool, constipation, diarrhea, nausea and vomiting.  Genitourinary:  Negative for difficulty urinating, dysuria, flank pain and hematuria.  Musculoskeletal:  Negative for arthralgias, back pain, gait problem, joint swelling  and myalgias.  Skin:  Negative for color change, pallor, rash and wound.  Neurological:  Negative for dizziness, tremors, weakness and light-headedness.  Hematological:  Negative for adenopathy. Does not bruise/bleed easily.  Psychiatric/Behavioral:  Negative for agitation, behavioral problems, confusion, decreased concentration, dysphoric mood and sleep disturbance.       Objective:   Physical Exam Constitutional:      Appearance: He is well-developed.  HENT:     Head: Normocephalic and atraumatic.  Eyes:     Conjunctiva/sclera: Conjunctivae normal.  Cardiovascular:     Rate and Rhythm: Normal rate and regular rhythm.  Pulmonary:     Effort: Pulmonary effort is normal. No respiratory distress.     Breath sounds: No wheezing.  Abdominal:     General: There is no distension.     Palpations: Abdomen is soft.  Musculoskeletal:        General: No tenderness. Normal range of motion.     Cervical back: Normal range of motion and neck supple.  Skin:    General: Skin is warm and dry.     Coloration: Skin is not pale.     Findings: No erythema or rash.  Neurological:     General: No focal deficit present.     Mental Status: He is alert and oriented to person, place, and time.  Psychiatric:         Mood and Affect: Mood normal.        Behavior: Behavior normal.        Thought Content: Thought content normal.        Judgment: Judgment normal.          Assessment & Plan:   HIV disease:  I am ordering a viral load CD4 count CBC and MP  I am continuing his Biktarvy.  He may be a candidate for the DO-IT study though BMI was barely below 30 at present  I Guillain-Barr syndrome: No further progression at this point time or recurrence G  Alcoholism: Has cut down on his drinking though still is drinking daily.   Syphilis: He received 1 shot of Bicillin 2,400,000 units but we will restart the clock and give him 1 shot this week next week and a third shot the week afterwards  History of chlamydia gonorrhea and syphilis we will check for syphilis as well as gonorrhea and chlamydia genital next genital sites.

## 2021-07-04 ENCOUNTER — Other Ambulatory Visit: Payer: Self-pay

## 2021-07-04 ENCOUNTER — Telehealth: Payer: Self-pay

## 2021-07-04 DIAGNOSIS — F1010 Alcohol abuse, uncomplicated: Secondary | ICD-10-CM

## 2021-07-04 LAB — CYTOLOGY, (ORAL, ANAL, URETHRAL) ANCILLARY ONLY
Chlamydia: NEGATIVE
Chlamydia: NEGATIVE
Comment: NEGATIVE
Comment: NEGATIVE
Comment: NORMAL
Comment: NORMAL
Neisseria Gonorrhea: NEGATIVE
Neisseria Gonorrhea: NEGATIVE

## 2021-07-04 LAB — URINE CYTOLOGY ANCILLARY ONLY
Chlamydia: NEGATIVE
Comment: NEGATIVE
Comment: NORMAL
Neisseria Gonorrhea: NEGATIVE

## 2021-07-04 LAB — T-HELPER CELL (CD4) - (RCID CLINIC ONLY)
CD4 % Helper T Cell: 17 % — ABNORMAL LOW (ref 33–65)
CD4 T Cell Abs: 272 /uL — ABNORMAL LOW (ref 400–1790)

## 2021-07-04 NOTE — Telephone Encounter (Signed)
Called patient to discuss elevated LFTs. Was able to review results with patient. Encouraged he decrease alcohol intake and repeat labs after Bicillin #2.  Patient did not have any questions during call.  Leatrice Jewels, RMA

## 2021-07-04 NOTE — Telephone Encounter (Signed)
-----   Message from Randall Hiss, MD sent at 07/04/2021  1:36 PM EST ----- Patient needs to reduce his alcohol consumption.  LFTs are markedly elevated.  We can repeat a CMP when he comes back for his next Bicillin injection ----- Message ----- From: Interface, [facility] Lab Results In Sent: 07/03/2021  10:44 PM EST To: Randall Hiss, MD

## 2021-07-05 LAB — COMPLETE METABOLIC PANEL WITH GFR
AG Ratio: 1.3 (calc) (ref 1.0–2.5)
ALT: 79 U/L — ABNORMAL HIGH (ref 9–46)
AST: 161 U/L — ABNORMAL HIGH (ref 10–40)
Albumin: 4 g/dL (ref 3.6–5.1)
Alkaline phosphatase (APISO): 197 U/L — ABNORMAL HIGH (ref 36–130)
BUN/Creatinine Ratio: 4 (calc) — ABNORMAL LOW (ref 6–22)
BUN: 3 mg/dL — ABNORMAL LOW (ref 7–25)
CO2: 36 mmol/L — ABNORMAL HIGH (ref 20–32)
Calcium: 8.6 mg/dL (ref 8.6–10.3)
Chloride: 98 mmol/L (ref 98–110)
Creat: 0.84 mg/dL (ref 0.60–1.24)
Globulin: 3.1 g/dL (calc) (ref 1.9–3.7)
Glucose, Bld: 93 mg/dL (ref 65–99)
Potassium: 2.8 mmol/L — ABNORMAL LOW (ref 3.5–5.3)
Sodium: 141 mmol/L (ref 135–146)
Total Bilirubin: 1.9 mg/dL — ABNORMAL HIGH (ref 0.2–1.2)
Total Protein: 7.1 g/dL (ref 6.1–8.1)
eGFR: 121 mL/min/{1.73_m2} (ref >=60)

## 2021-07-05 LAB — CBC WITH DIFFERENTIAL/PLATELET
Absolute Monocytes: 529 cells/uL (ref 200–950)
Basophils Absolute: 18 cells/uL (ref 0–200)
Basophils Relative: 0.4 %
Eosinophils Absolute: 138 cells/uL (ref 15–500)
Eosinophils Relative: 3 %
HCT: 35.1 % — ABNORMAL LOW (ref 38.5–50.0)
Hemoglobin: 12.2 g/dL — ABNORMAL LOW (ref 13.2–17.1)
Lymphs Abs: 1799 cells/uL (ref 850–3900)
MCH: 30.4 pg (ref 27.0–33.0)
MCHC: 34.8 g/dL (ref 32.0–36.0)
MCV: 87.5 fL (ref 80.0–100.0)
MPV: 9.4 fL (ref 7.5–12.5)
Monocytes Relative: 11.5 %
Neutro Abs: 2116 cells/uL (ref 1500–7800)
Neutrophils Relative %: 46 %
Platelets: 236 10*3/uL (ref 140–400)
RBC: 4.01 10*6/uL — ABNORMAL LOW (ref 4.20–5.80)
RDW: 16.5 % — ABNORMAL HIGH (ref 11.0–15.0)
Total Lymphocyte: 39.1 %
WBC: 4.6 10*3/uL (ref 3.8–10.8)

## 2021-07-05 LAB — FLUORESCENT TREPONEMAL AB(FTA)-IGG-BLD: Fluorescent Treponemal ABS: REACTIVE — AB

## 2021-07-05 LAB — RPR TITER: RPR Titer: 1:2 {titer} — ABNORMAL HIGH

## 2021-07-05 LAB — HIV-1 RNA QUANT-NO REFLEX-BLD
HIV 1 RNA Quant: 979 Copies/mL — ABNORMAL HIGH
HIV-1 RNA Quant, Log: 2.99 Log cps/mL — ABNORMAL HIGH

## 2021-07-05 LAB — RPR: RPR Ser Ql: REACTIVE — AB

## 2021-07-06 ENCOUNTER — Telehealth: Payer: Self-pay

## 2021-07-06 ENCOUNTER — Other Ambulatory Visit: Payer: Self-pay | Admitting: Infectious Disease

## 2021-07-06 DIAGNOSIS — B20 Human immunodeficiency virus [HIV] disease: Secondary | ICD-10-CM

## 2021-07-06 NOTE — Telephone Encounter (Signed)
-----   Message from Randall Hiss, MD sent at 07/04/2021  9:53 PM EST ----- K is super low aS well we will be rechecking but he should also see PCP. I dont know if this is from lasix or diuresis from too much alcohol? ----- Message ----- From: Interface, [facility] Lab Results In Sent: 07/03/2021  10:44 PM EST To: Randall Hiss, MD

## 2021-07-06 NOTE — Telephone Encounter (Signed)
Patient aware of results and voiced his understanding regarding follow up with PCP and to reduce alcohol intake.   Sherissa Tenenbaum Lesli Albee, CMA

## 2021-07-10 ENCOUNTER — Other Ambulatory Visit: Payer: Self-pay

## 2021-07-10 ENCOUNTER — Other Ambulatory Visit: Payer: Medicaid Other

## 2021-07-10 ENCOUNTER — Ambulatory Visit (INDEPENDENT_AMBULATORY_CARE_PROVIDER_SITE_OTHER): Payer: Medicaid Other

## 2021-07-10 DIAGNOSIS — B20 Human immunodeficiency virus [HIV] disease: Secondary | ICD-10-CM

## 2021-07-10 DIAGNOSIS — F1010 Alcohol abuse, uncomplicated: Secondary | ICD-10-CM

## 2021-07-10 DIAGNOSIS — A539 Syphilis, unspecified: Secondary | ICD-10-CM | POA: Diagnosis present

## 2021-07-10 MED ORDER — PENICILLIN G BENZATHINE 1200000 UNIT/2ML IM SUSY
2.4000 10*6.[IU] | PREFILLED_SYRINGE | Freq: Once | INTRAMUSCULAR | Status: AC
  Administered 2021-07-10: 2.4 10*6.[IU] via INTRAMUSCULAR

## 2021-07-11 ENCOUNTER — Telehealth: Payer: Self-pay

## 2021-07-11 NOTE — Telephone Encounter (Signed)
Called patient with lab results. Encouraged patient to connect with [facility] to help decrease alcohol intake. Is scheduled to see Okey Regal, Counselor next Tuesday.  Patient would prefer to see counselor before following with [facility]. Juanita Laster, RMA

## 2021-07-11 NOTE — Telephone Encounter (Signed)
-----   Message from Randall Hiss, MD sent at 07/11/2021  8:25 AM EST ----- Liver function tests again looking not much better he really needs to engage in alcohol A and rehab can he connect to our counselor and/or [facility], rehab ----- Message ----- From: Janace Hoard Lab Results In Sent: 07/10/2021  11:21 PM EST To: Randall Hiss, MD

## 2021-07-13 LAB — HIV RNA, RTPCR W/R GT (RTI, PI,INT)
HIV 1 RNA Quant: <20 copies/mL — AB
HIV-1 RNA Quant, Log: <1.3 Log copies/mL — AB

## 2021-07-13 LAB — COMPLETE METABOLIC PANEL WITH GFR
AG Ratio: 1.2 (calc) (ref 1.0–2.5)
ALT: 66 U/L — ABNORMAL HIGH (ref 9–46)
AST: 140 U/L — ABNORMAL HIGH (ref 10–40)
Albumin: 3.6 g/dL (ref 3.6–5.1)
Alkaline phosphatase (APISO): 180 U/L — ABNORMAL HIGH (ref 36–130)
BUN/Creatinine Ratio: 6 (calc) (ref 6–22)
BUN: 5 mg/dL — ABNORMAL LOW (ref 7–25)
CO2: 34 mmol/L — ABNORMAL HIGH (ref 20–32)
Calcium: 8 mg/dL — ABNORMAL LOW (ref 8.6–10.3)
Chloride: 100 mmol/L (ref 98–110)
Creat: 0.78 mg/dL (ref 0.60–1.24)
Globulin: 3.1 g/dL (calc) (ref 1.9–3.7)
Glucose, Bld: 89 mg/dL (ref 65–99)
Potassium: 3.1 mmol/L — ABNORMAL LOW (ref 3.5–5.3)
Sodium: 144 mmol/L (ref 135–146)
Total Bilirubin: 0.9 mg/dL (ref 0.2–1.2)
Total Protein: 6.7 g/dL (ref 6.1–8.1)
eGFR: 124 mL/min/{1.73_m2} (ref >=60)

## 2021-07-17 ENCOUNTER — Ambulatory Visit (INDEPENDENT_AMBULATORY_CARE_PROVIDER_SITE_OTHER): Payer: Medicaid Other

## 2021-07-17 ENCOUNTER — Ambulatory Visit: Payer: Medicaid Other

## 2021-07-17 ENCOUNTER — Other Ambulatory Visit: Payer: Self-pay

## 2021-07-17 DIAGNOSIS — A539 Syphilis, unspecified: Secondary | ICD-10-CM

## 2021-07-17 MED ORDER — PENICILLIN G BENZATHINE 1200000 UNIT/2ML IM SUSY
1.2000 10*6.[IU] | PREFILLED_SYRINGE | Freq: Once | INTRAMUSCULAR | Status: AC
  Administered 2021-07-17: 1.2 10*6.[IU] via INTRAMUSCULAR

## 2021-07-17 NOTE — Progress Notes (Unsigned)
Therapist met with client as scheduled to continue rapport building. Therapist introduced and shared some information about herself and encouraged the same from client in order to build trust and openness within the counseling relationship. Therapist discussed and reviewed treatment plans with client based on the result from client's assessment. Therapist discussed client current mental health symptoms /diagnosis of DSM-5, as well as recommendations and target population for various eligibility services. Therapist assessed for SI/HI during session and will follow-up with client during the next session.  Client met with therapist as scheduled. Client presented alert mood and appeared to euthymic; affect was congruent with client report of mood. Client was able to make connections between consequences, challenges and alternative thoughts of mental health recovery during this session. Client continues to express their needs in the development of treatment goals and described different communities of which can serve an added support for them. Therapeutic goal(s) continues to remain minimal at this time. client denied SI/HI.

## 2021-08-03 ENCOUNTER — Other Ambulatory Visit: Payer: Self-pay | Admitting: Physician Assistant

## 2021-08-03 DIAGNOSIS — R202 Paresthesia of skin: Secondary | ICD-10-CM

## 2021-08-10 ENCOUNTER — Other Ambulatory Visit: Payer: Self-pay

## 2021-08-10 DIAGNOSIS — B20 Human immunodeficiency virus [HIV] disease: Secondary | ICD-10-CM

## 2021-08-10 MED ORDER — SULFAMETHOXAZOLE-TRIMETHOPRIM 800-160 MG PO TABS: 1.0000 | ORAL_TABLET | Freq: Every day | ORAL | 5 refills | Status: DC

## 2021-09-05 ENCOUNTER — Other Ambulatory Visit (HOSPITAL_COMMUNITY): Payer: Self-pay

## 2021-09-13 ENCOUNTER — Other Ambulatory Visit: Payer: Self-pay | Admitting: Internal Medicine

## 2021-09-13 DIAGNOSIS — I10 Essential (primary) hypertension: Secondary | ICD-10-CM

## 2021-09-13 NOTE — Telephone Encounter (Signed)
Please refill as per office routine med refill policy (all routine meds to be refilled for 3 mo or monthly (per pt preference) up to one year from last visit, then month to month grace period for 3 mo, then further med refills will have to be denied) ? ?

## 2021-10-01 ENCOUNTER — Other Ambulatory Visit: Payer: Self-pay | Admitting: Internal Medicine

## 2021-10-01 DIAGNOSIS — I10 Essential (primary) hypertension: Secondary | ICD-10-CM

## 2021-10-01 NOTE — Telephone Encounter (Signed)
Please refill as per office routine med refill policy (all routine meds to be refilled for 3 mo or monthly (per pt preference) up to one year from last visit, then month to month grace period for 3 mo, then further med refills will have to be denied) ? ?

## 2021-10-02 ENCOUNTER — Ambulatory Visit: Payer: Medicaid Other | Admitting: Infectious Disease

## 2021-10-15 ENCOUNTER — Telehealth: Payer: Self-pay

## 2021-10-15 NOTE — Telephone Encounter (Signed)
Called patient to reschedule 3 month f/u with Dr. Tommy Medal. No answer. Left HIPAA-compliant voicemail requesting call back. ? ?Patient also needs to confirm the pharmacy that he would like to use for prescription refills to complete refill requests. ? ?Binnie Kand, RN  ?

## 2021-10-16 NOTE — Telephone Encounter (Signed)
Received refill request for patient's Biktarvy, Gabapentin, and Amlodipine. Spoke with patient this morning who states that he needs refills and would like to fill with [facility]. Requested patient call PCP today regarding Gabapentin and Amlodipine refill. Will follow up with them today.  ?Rescheduled office visit with Dr. Zenaida Niece dam from 5/10  ?Juanita Laster, RMA  ?

## 2021-10-17 ENCOUNTER — Ambulatory Visit: Payer: Medicaid Other | Admitting: Infectious Disease

## 2021-10-24 ENCOUNTER — Encounter: Payer: Medicaid Other | Admitting: Internal Medicine

## 2021-10-24 ENCOUNTER — Other Ambulatory Visit: Payer: Self-pay | Admitting: Internal Medicine

## 2021-10-24 DIAGNOSIS — I10 Essential (primary) hypertension: Secondary | ICD-10-CM

## 2021-10-24 NOTE — Telephone Encounter (Signed)
Please refill as per office routine med refill policy (all routine meds to be refilled for 3 mo or monthly (per pt preference) up to one year from last visit, then month to month grace period for 3 mo, then further med refills will have to be denied) ? ?

## 2021-10-31 ENCOUNTER — Encounter: Payer: Self-pay | Admitting: Infectious Diseases

## 2021-11-08 ENCOUNTER — Other Ambulatory Visit (HOSPITAL_BASED_OUTPATIENT_CLINIC_OR_DEPARTMENT_OTHER): Payer: Self-pay

## 2021-11-26 ENCOUNTER — Encounter: Payer: Self-pay | Admitting: Internal Medicine

## 2021-11-26 ENCOUNTER — Ambulatory Visit (INDEPENDENT_AMBULATORY_CARE_PROVIDER_SITE_OTHER): Payer: PRIVATE HEALTH INSURANCE | Admitting: Internal Medicine

## 2021-11-26 ENCOUNTER — Telehealth: Payer: Self-pay

## 2021-11-26 ENCOUNTER — Telehealth: Payer: Self-pay | Admitting: Internal Medicine

## 2021-11-26 VITALS — BP 138/100 | HR 88 | Temp 99.0°F | Ht 70.0 in | Wt 194.8 lb

## 2021-11-26 DIAGNOSIS — E876 Hypokalemia: Secondary | ICD-10-CM

## 2021-11-26 DIAGNOSIS — F1010 Alcohol abuse, uncomplicated: Secondary | ICD-10-CM

## 2021-11-26 DIAGNOSIS — F17200 Nicotine dependence, unspecified, uncomplicated: Secondary | ICD-10-CM

## 2021-11-26 DIAGNOSIS — Z0001 Encounter for general adult medical examination with abnormal findings: Secondary | ICD-10-CM | POA: Diagnosis not present

## 2021-11-26 DIAGNOSIS — K219 Gastro-esophageal reflux disease without esophagitis: Secondary | ICD-10-CM

## 2021-11-26 DIAGNOSIS — I10 Essential (primary) hypertension: Secondary | ICD-10-CM | POA: Diagnosis not present

## 2021-11-26 DIAGNOSIS — R739 Hyperglycemia, unspecified: Secondary | ICD-10-CM | POA: Diagnosis not present

## 2021-11-26 DIAGNOSIS — E538 Deficiency of other specified B group vitamins: Secondary | ICD-10-CM | POA: Diagnosis not present

## 2021-11-26 DIAGNOSIS — E559 Vitamin D deficiency, unspecified: Secondary | ICD-10-CM

## 2021-11-26 LAB — URINALYSIS, ROUTINE W REFLEX MICROSCOPIC
Hgb urine dipstick: NEGATIVE
Leukocytes,Ua: NEGATIVE
Nitrite: NEGATIVE
Specific Gravity, Urine: 1.01 (ref 1.000–1.030)
Urine Glucose: NEGATIVE
Urobilinogen, UA: 2 — AB (ref 0.0–1.0)
pH: 7 (ref 5.0–8.0)

## 2021-11-26 LAB — LIPID PANEL
Cholesterol: 94 mg/dL (ref 0–200)
HDL: 43.4 mg/dL (ref >39.00)
LDL Cholesterol: 39 mg/dL (ref 0–99)
NonHDL: 51.03
Total CHOL/HDL Ratio: 2
Triglycerides: 62 mg/dL (ref 0.0–149.0)
VLDL: 12.4 mg/dL (ref 0.0–40.0)

## 2021-11-26 LAB — HEPATIC FUNCTION PANEL
ALT: 76 U/L — ABNORMAL HIGH (ref 0–53)
AST: 217 U/L — ABNORMAL HIGH (ref 0–37)
Albumin: 3.1 g/dL — ABNORMAL LOW (ref 3.5–5.2)
Alkaline Phosphatase: 170 U/L — ABNORMAL HIGH (ref 39–117)
Bilirubin, Direct: 0.4 mg/dL — ABNORMAL HIGH (ref 0.0–0.3)
Total Bilirubin: 1.3 mg/dL — ABNORMAL HIGH (ref 0.2–1.2)
Total Protein: 6.5 g/dL (ref 6.0–8.3)

## 2021-11-26 LAB — HEMOGLOBIN A1C: Hgb A1c MFr Bld: 5.1 % (ref 4.6–6.5)

## 2021-11-26 LAB — TSH: TSH: 1.66 u[IU]/mL (ref 0.35–5.50)

## 2021-11-26 LAB — BASIC METABOLIC PANEL
BUN: 3 mg/dL — ABNORMAL LOW (ref 6–23)
CO2: 39 mEq/L — ABNORMAL HIGH (ref 19–32)
Calcium: 8.6 mg/dL (ref 8.4–10.5)
Chloride: 93 mEq/L — ABNORMAL LOW (ref 96–112)
Creatinine, Ser: 0.75 mg/dL (ref 0.40–1.50)
GFR: 121.23 mL/min (ref >60.00)
Glucose, Bld: 105 mg/dL — ABNORMAL HIGH (ref 70–99)
Potassium: 2.8 mEq/L — CL (ref 3.5–5.1)
Sodium: 141 mEq/L (ref 135–145)

## 2021-11-26 LAB — CBC WITH DIFFERENTIAL/PLATELET
Basophils Absolute: 0 10*3/uL (ref 0.0–0.1)
Basophils Relative: 0.5 % (ref 0.0–3.0)
Eosinophils Absolute: 0.1 10*3/uL (ref 0.0–0.7)
Eosinophils Relative: 2.3 % (ref 0.0–5.0)
HCT: 31.4 % — ABNORMAL LOW (ref 39.0–52.0)
Hemoglobin: 10.7 g/dL — ABNORMAL LOW (ref 13.0–17.0)
Lymphocytes Relative: 32.1 % (ref 12.0–46.0)
Lymphs Abs: 1.4 10*3/uL (ref 0.7–4.0)
MCHC: 34.1 g/dL (ref 30.0–36.0)
MCV: 102.1 fl — ABNORMAL HIGH (ref 78.0–100.0)
Monocytes Absolute: 0.5 10*3/uL (ref 0.1–1.0)
Monocytes Relative: 10.4 % (ref 3.0–12.0)
Neutro Abs: 2.4 10*3/uL (ref 1.4–7.7)
Neutrophils Relative %: 54.7 % (ref 43.0–77.0)
Platelets: 267 10*3/uL (ref 150.0–400.0)
RBC: 3.07 Mil/uL — ABNORMAL LOW (ref 4.22–5.81)
RDW: 19.5 % — ABNORMAL HIGH (ref 11.5–15.5)
WBC: 4.4 10*3/uL (ref 4.0–10.5)

## 2021-11-26 LAB — VITAMIN D 25 HYDROXY (VIT D DEFICIENCY, FRACTURES): VITD: 23.06 ng/mL — ABNORMAL LOW (ref 30.00–100.00)

## 2021-11-26 LAB — VITAMIN B12: Vitamin B-12: 683 pg/mL (ref 211–911)

## 2021-11-26 MED ORDER — CARVEDILOL 12.5 MG PO TABS: ORAL_TABLET | ORAL | 3 refills | Status: DC

## 2021-11-26 MED ORDER — LANSOPRAZOLE 30 MG PO CPDR: 30.0000 mg | DELAYED_RELEASE_CAPSULE | Freq: Every day | ORAL | 3 refills | Status: DC

## 2021-11-26 MED ORDER — POTASSIUM CHLORIDE ER 10 MEQ PO TBCR: 10.0000 meq | EXTENDED_RELEASE_TABLET | Freq: Every day | ORAL | 0 refills | Status: DC

## 2021-11-26 MED ORDER — AMLODIPINE BESYLATE 10 MG PO TABS: 10.0000 mg | ORAL_TABLET | Freq: Every day | ORAL | 3 refills | Status: DC

## 2021-11-26 NOTE — Assessment & Plan Note (Signed)
BP Readings from Last 3 Encounters:  11/26/21 (!) 138/100  07/03/21 (!) 147/107  03/28/21 (!) 143/101   Uncontrolled, pt not taking BP med, pt to restart medical treatment amlodipine 10 mg qd, and coreg 12.5 bid

## 2021-11-26 NOTE — Assessment & Plan Note (Signed)

## 2021-11-26 NOTE — Patient Instructions (Signed)
Please take all new medication as prescribed - the BP meds restarted, and the prevacid 30 mg per day  Please stop drinking alcohol, and please stop smoking  Please continue all other medications as before, and refills have been done if requested.  Please have the pharmacy call with any other refills you may need.  Please continue your efforts at being more active, low cholesterol diet, and weight control.  You are otherwise up to date with prevention measures today.  Please keep your appointments with your specialists as you may have planned  Please go to the LAB at the blood drawing area for the tests to be done  You will be contacted by phone if any changes need to be made immediately.  Otherwise, you will receive a letter about your results with an explanation, but please check with MyChart first.  Please remember to sign up for MyChart if you have not done so, as this will be important to you in the future with finding out test results, communicating by private email, and scheduling acute appointments online when needed.  Please make an Appointment to return in 6 months, or sooner if needed

## 2021-11-26 NOTE — Assessment & Plan Note (Signed)
Mild to mod, for prevacid 30 mg qd as is covered by his insurance,  to f/u any worsening symptoms or concerns

## 2021-11-26 NOTE — Assessment & Plan Note (Signed)
Last vitamin D Lab Results  Component Value Date   VD25OH 7.92 (L) 09/09/2019   Low, reminded to start oral replacement

## 2021-11-26 NOTE — Progress Notes (Signed)
Patient ID: Harold Flores, male   DOB: 12-03-1990, 31 y.o.   MRN: [id number]         Chief Complaint:: wellness exam and etoh abuse, low vit d, smoking, gerd, htn       HPI:  Harold Flores is a 31 y.o. male here for wellness exam; decliens hpv vax, o/w up to date                        Also cat died last wk, ran out of BP med, needs refill.  Pt denies chest pain, increased sob or doe, wheezing, orthopnea, PND, increased LE swelling, palpitations, dizziness or syncope.   Pt denies polydipsia, polyuria, or new focal neuro s/s.    Pt denies fever, wt loss, night sweats, loss of appetite, or other constitutional symptoms  Sees ID on regular basis.  Drinking 4 beers per day, doesn't see this as a problem.  Also has had mild worsening reflux, but no  other abd pain, dysphagia, n/v, bowel change or blood.      Wt Readings from Last 3 Encounters:  11/26/21 194 lb 12.8 oz (88.4 kg)  07/03/21 194 lb (88 kg)  03/28/21 200 lb (90.7 kg)   BP Readings from Last 3 Encounters:  11/26/21 (!) 138/100  07/03/21 (!) 147/107  03/28/21 (!) 143/101   Immunization History  Administered Date(s) Administered   HPV Quadrivalent 05/01/2015   Hepatitis B 10/21/2008, 11/21/2008, 01/09/2009   Influenza Split 04/09/2011, 03/19/2012   Influenza Whole 03/09/2008, 05/15/2010   Influenza,inj,Quad PF,6+ Mos 08/25/2014, 02/06/2015, 04/23/2016, 05/07/2017, 04/29/2018, 03/28/2021   PFIZER Comirnaty(Gray Top)Covid-19 Tri-Sucrose Vaccine 10/19/2020, 11/09/2020   PPD Test 02/02/2015   Pfizer Covid-19 Vaccine Bivalent Booster 69yrs & up 03/28/2021   Pneumococcal Conjugate-13 04/23/2016   Pneumococcal Polysaccharide-23 10/08/2012, 02/02/2015   Td 12/08/2009   Tdap 09/09/2019   There are no preventive care reminders to display for this patient.     Past Medical History:  Diagnosis Date   Anal fissure    Anal fistula    Anal ulcer    Asthma    Epistaxis    GAD (generalized anxiety disorder)    GERD  (gastroesophageal reflux disease)    Hemorrhoid    HIV (human immunodeficiency virus infection) ([place])    Hypertension    Syphilis    Varicella    Past Surgical History:  Procedure Laterality Date   EYE SURGERY  1994   blephroplasty right eye   RADIOLOGY WITH ANESTHESIA N/A 08/08/2020   Procedure: MRI WITH ANESTHESIA- BRAIN WITH AND WITHOUT CONTRAST,  CERVICAL SPINE WITH AND WITHOUT CONTRAST, LUMBAR SPINE WITH WITHOUT CONTRAST, THORACIC SPINE WITH WITHOUT CONTRAST;  Surgeon: Radiologist, Medication, MD;  Location: [facility];  Service: Radiology;  Laterality: N/A;    reports that he has been smoking e-cigarettes and cigarettes. He has a 2.50 pack-year smoking history. He has never used smokeless tobacco. He reports current alcohol use of about 7.0 standard drinks of alcohol per week. He reports that he does not currently use drugs after having used the following drugs: Marijuana. family history includes Diabetes in his maternal grandmother and mother; Heart disease in his paternal grandfather; Hyperlipidemia in his mother. No Known Allergies Current Outpatient Medications on File Prior to Visit  Medication Sig Dispense Refill   bictegravir-emtricitabine-tenofovir AF (BIKTARVY) 50-200-25 MG TABS tablet TAKE 1 TABLET BY MOUTH DAILY. 30 tablet 11   gabapentin (NEURONTIN) 400 MG capsule Take 1 capsule (400 mg total) by  mouth 4 (four) times daily. 120 capsule 3   sertraline (ZOLOFT) 100 MG tablet Take 1 tablet (100 mg total) by mouth daily. Take with 50mg  tablet for total of 150mg  30 tablet 6   sertraline (ZOLOFT) 50 MG tablet Take 1 tablet (50 mg total) by mouth daily. Take with 100mg  for total of 150mg  30 tablet 5   sulfamethoxazole-trimethoprim (BACTRIM DS) 800-160 MG tablet TAKE 1 TABLET BY MOUTH DAILY. 30 tablet 5   No current facility-administered medications on file prior to visit.        ROS:  All others reviewed and negative.  Objective        PE:  BP (!) 138/100 (BP Location: Right Arm,  Patient Position: Sitting, Cuff Size: Large)   Pulse 88   Temp 99 F (37.2 C) (Oral)   Ht 5\' 10"  (1.778 m)   Wt 194 lb 12.8 oz (88.4 kg)   SpO2 100%   BMI 27.95 kg/m                 Constitutional: Pt appears in NAD               HENT: Head: NCAT.                Right Ear: External ear normal.                 Left Ear: External ear normal.                Eyes: . Pupils are equal, round, and reactive to light. Conjunctivae and EOM are normal               Nose: without d/c or deformity               Neck: Neck supple. Gross normal ROM               Cardiovascular: Normal rate and regular rhythm.                 Pulmonary/Chest: Effort normal and breath sounds without rales or wheezing.                Abd:  Soft, NT, ND, + BS, no organomegaly               Neurological: Pt is alert. At baseline orientation, motor grossly intact               Skin: Skin is warm. No rashes, no other new lesions, LE edema - none               Psychiatric: Pt behavior is normal without agitation   Micro: none  Cardiac tracings I have personally interpreted today:  none  Pertinent Radiological findings (summarize): none   Lab Results  Component Value Date   WBC 4.6 07/03/2021   HGB 12.2 (L) 07/03/2021   HCT 35.1 (L) 07/03/2021   PLT 236 07/03/2021   GLUCOSE 89 07/10/2021   CHOL 135 03/28/2021   TRIG 101 03/28/2021   HDL 61 03/28/2021   LDLCALC 56 03/28/2021   ALT 66 (H) 07/10/2021   AST 140 (H) 07/10/2021   NA 144 07/10/2021   K 3.1 (L) 07/10/2021   CL 100 07/10/2021   CREATININE 0.78 07/10/2021   BUN 5 (L) 07/10/2021   CO2 34 (H) 07/10/2021   TSH 3.383 08/06/2020   INR 1.1 08/07/2020   HGBA1C 5.5 04/23/2016   Assessment/Plan:  07/12/2021 is a  31 y.o. Black or African American [2] male with  has a past medical history of Anal fissure, Anal fistula, Anal ulcer, Asthma, Epistaxis, GAD (generalized anxiety disorder), GERD (gastroesophageal reflux disease), Hemorrhoid, HIV (human  immunodeficiency virus infection) ([facility]), Hypertension, Syphilis, and Varicella.  Vitamin D deficiency disease Last vitamin D Lab Results  Component Value Date   VD25OH 7.92 (L) 09/09/2019   Low, reminded to start oral replacement   Encounter for well adult exam with abnormal findings Age and sex appropriate education and counseling updated with regular exercise and diet Referrals for preventative services - none needed Immunizations addressed - none needed Smoking counseling  - none needed Evidence for depression or other mood disorder - none significant Most recent labs reviewed. I have personally reviewed and have noted: 1) the patient's medical and social history 2) The patient's current medications and supplements 3) The patient's height, weight, and BMI have been recorded in the chart   ETOH abuse Pt advised to abstain completely as 4 beers per day can lead to long term cirrhosis, and elevated BP  Smoker Pt counsled to quit, pt not ready  Gastroesophageal reflux disease Mild to mod, for prevacid 30 mg qd as is covered by his insurance,  to f/u any worsening symptoms or concerns  Essential hypertension, benign BP Readings from Last 3 Encounters:  11/26/21 (!) 138/100  07/03/21 (!) 147/107  03/28/21 (!) 143/101   Uncontrolled, pt not taking BP med, pt to restart medical treatment amlodipine 10 mg qd, and coreg 12.5 bid  Followup: Return in about 6 months (around 05/28/2022).  Oliver Barre, MD 11/26/2021 1:04 PM [facility]

## 2021-11-26 NOTE — Telephone Encounter (Signed)
Ok to contact pt  K was 2.8   Ok for [employer] con 10 meq - 2 po qd x 5 days  Please repeat BMP in 1 wk

## 2021-11-26 NOTE — Telephone Encounter (Signed)
Patient notified

## 2021-11-26 NOTE — Assessment & Plan Note (Signed)
Pt advised to abstain completely as 4 beers per day can lead to long term cirrhosis, and elevated BP

## 2021-11-26 NOTE — Assessment & Plan Note (Signed)
Pt counsled to quit, pt not ready °

## 2021-11-27 ENCOUNTER — Other Ambulatory Visit: Payer: Self-pay | Admitting: Internal Medicine

## 2021-11-27 DIAGNOSIS — D509 Iron deficiency anemia, unspecified: Secondary | ICD-10-CM

## 2022-03-21 ENCOUNTER — Telehealth: Payer: Self-pay
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
## 2022-03-25 ENCOUNTER — Ambulatory Visit (INDEPENDENT_AMBULATORY_CARE_PROVIDER_SITE_OTHER): Payer: PRIVATE HEALTH INSURANCE | Admitting: Infectious Disease

## 2022-03-25 ENCOUNTER — Other Ambulatory Visit: Payer: Self-pay

## 2022-03-25 ENCOUNTER — Encounter: Payer: Self-pay | Admitting: Infectious Disease

## 2022-03-25 ENCOUNTER — Other Ambulatory Visit (HOSPITAL_COMMUNITY): Payer: Self-pay

## 2022-03-25 VITALS — BP 101/67 | HR 112 | Temp 97.8°F | Ht 70.0 in | Wt 178.0 lb

## 2022-03-25 DIAGNOSIS — B370 Candidal stomatitis: Secondary | ICD-10-CM | POA: Insufficient documentation

## 2022-03-25 DIAGNOSIS — F1010 Alcohol abuse, uncomplicated: Secondary | ICD-10-CM

## 2022-03-25 DIAGNOSIS — Z5900 Homelessness unspecified: Secondary | ICD-10-CM

## 2022-03-25 DIAGNOSIS — E538 Deficiency of other specified B group vitamins: Secondary | ICD-10-CM | POA: Diagnosis not present

## 2022-03-25 DIAGNOSIS — F489 Nonpsychotic mental disorder, unspecified: Secondary | ICD-10-CM

## 2022-03-25 DIAGNOSIS — B20 Human immunodeficiency virus [HIV] disease: Secondary | ICD-10-CM

## 2022-03-25 DIAGNOSIS — F411 Generalized anxiety disorder: Secondary | ICD-10-CM

## 2022-03-25 DIAGNOSIS — G610 Guillain-Barre syndrome: Secondary | ICD-10-CM

## 2022-03-25 DIAGNOSIS — F69 Unspecified disorder of adult personality and behavior: Secondary | ICD-10-CM

## 2022-03-25 DIAGNOSIS — Z21 Asymptomatic human immunodeficiency virus [HIV] infection status: Secondary | ICD-10-CM

## 2022-03-25 DIAGNOSIS — F322 Major depressive disorder, single episode, severe without psychotic features: Secondary | ICD-10-CM

## 2022-03-25 HISTORY — DX: Candidal stomatitis: B37.0

## 2022-03-25 MED ORDER — BICTEGRAVIR-EMTRICITAB-TENOFOV 50-200-25 MG PO TABS
1.0000 | ORAL_TABLET | Freq: Every day | ORAL | 11 refills | Status: DC
  Filled 2022-03-25: qty 30, 30d supply, fill #0

## 2022-03-25 MED ORDER — SULFAMETHOXAZOLE-TRIMETHOPRIM 800-160 MG PO TABS
1.0000 | ORAL_TABLET | Freq: Every day | ORAL | 11 refills | Status: DC
  Filled 2022-03-25: qty 30, 30d supply, fill #0

## 2022-03-25 MED ORDER — FLUCONAZOLE 100 MG PO TABS
100.0000 mg | ORAL_TABLET | Freq: Every day | ORAL | 1 refills | Status: DC
  Filled 2022-03-25: qty 14, 14d supply, fill #0
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
## 2022-03-26 ENCOUNTER — Other Ambulatory Visit (HOSPITAL_COMMUNITY): Payer: Self-pay

## 2022-03-26 ENCOUNTER — Telehealth: Payer: Self-pay

## 2022-03-26 ENCOUNTER — Emergency Department (HOSPITAL_COMMUNITY): Payer: No Typology Code available for payment source

## 2022-03-26 ENCOUNTER — Other Ambulatory Visit: Payer: Self-pay

## 2022-03-26 ENCOUNTER — Encounter (HOSPITAL_COMMUNITY): Payer: Self-pay | Admitting: Emergency Medicine

## 2022-03-26 ENCOUNTER — Inpatient Hospital Stay (HOSPITAL_COMMUNITY)
Admission: EM | Admit: 2022-03-26 | Discharge: 2022-03-30 | DRG: 641 | Disposition: A | Discharge status: Home / Self Care | Payer: No Typology Code available for payment source | Attending: Internal Medicine | Admitting: Internal Medicine

## 2022-03-26 DIAGNOSIS — F1020 Alcohol dependence, uncomplicated: Secondary | ICD-10-CM | POA: Diagnosis present

## 2022-03-26 DIAGNOSIS — A530 Latent syphilis, unspecified as early or late: Secondary | ICD-10-CM | POA: Diagnosis present

## 2022-03-26 DIAGNOSIS — B20 Human immunodeficiency virus [HIV] disease: Secondary | ICD-10-CM | POA: Diagnosis present

## 2022-03-26 DIAGNOSIS — D72819 Decreased white blood cell count, unspecified: Secondary | ICD-10-CM | POA: Diagnosis present

## 2022-03-26 DIAGNOSIS — Z91148 Patient's other noncompliance with medication regimen for other reason: Secondary | ICD-10-CM

## 2022-03-26 DIAGNOSIS — D638 Anemia in other chronic diseases classified elsewhere: Secondary | ICD-10-CM | POA: Diagnosis present

## 2022-03-26 DIAGNOSIS — F1010 Alcohol abuse, uncomplicated: Secondary | ICD-10-CM | POA: Diagnosis not present

## 2022-03-26 DIAGNOSIS — F17290 Nicotine dependence, other tobacco product, uncomplicated: Secondary | ICD-10-CM | POA: Diagnosis present

## 2022-03-26 DIAGNOSIS — Z5900 Homelessness unspecified: Secondary | ICD-10-CM

## 2022-03-26 DIAGNOSIS — I10 Essential (primary) hypertension: Secondary | ICD-10-CM | POA: Diagnosis present

## 2022-03-26 DIAGNOSIS — Z8619 Personal history of other infectious and parasitic diseases: Secondary | ICD-10-CM

## 2022-03-26 DIAGNOSIS — J45909 Unspecified asthma, uncomplicated: Secondary | ICD-10-CM | POA: Diagnosis present

## 2022-03-26 DIAGNOSIS — D649 Anemia, unspecified: Secondary | ICD-10-CM | POA: Diagnosis not present

## 2022-03-26 DIAGNOSIS — K219 Gastro-esophageal reflux disease without esophagitis: Secondary | ICD-10-CM | POA: Diagnosis present

## 2022-03-26 DIAGNOSIS — E876 Hypokalemia: Principal | ICD-10-CM | POA: Diagnosis present

## 2022-03-26 DIAGNOSIS — F411 Generalized anxiety disorder: Secondary | ICD-10-CM | POA: Diagnosis present

## 2022-03-26 DIAGNOSIS — Z8249 Family history of ischemic heart disease and other diseases of the circulatory system: Secondary | ICD-10-CM

## 2022-03-26 DIAGNOSIS — B370 Candidal stomatitis: Secondary | ICD-10-CM | POA: Diagnosis present

## 2022-03-26 DIAGNOSIS — Z79899 Other long term (current) drug therapy: Secondary | ICD-10-CM

## 2022-03-26 LAB — COMPREHENSIVE METABOLIC PANEL
ALT: 21 U/L (ref 0–44)
AST: 56 U/L — ABNORMAL HIGH (ref 15–41)
Albumin: 2.6 g/dL — ABNORMAL LOW (ref 3.5–5.0)
Alkaline Phosphatase: 71 U/L (ref 38–126)
Anion gap: 16 — ABNORMAL HIGH (ref 5–15)
BUN: <5 mg/dL — ABNORMAL LOW (ref 6–20)
CO2: 39 mmol/L — ABNORMAL HIGH (ref 22–32)
Calcium: 7.6 mg/dL — ABNORMAL LOW (ref 8.9–10.3)
Chloride: 81 mmol/L — ABNORMAL LOW (ref 98–111)
Creatinine, Ser: 0.97 mg/dL (ref 0.61–1.24)
GFR, Estimated: >60 mL/min (ref >60)
Glucose, Bld: 107 mg/dL — ABNORMAL HIGH (ref 70–99)
Potassium: <2 mmol/L — CL (ref 3.5–5.1)
Sodium: 136 mmol/L (ref 135–145)
Total Bilirubin: 1.7 mg/dL — ABNORMAL HIGH (ref 0.3–1.2)
Total Protein: 6.4 g/dL — ABNORMAL LOW (ref 6.5–8.1)

## 2022-03-26 LAB — PREPARE RBC (CROSSMATCH)

## 2022-03-26 LAB — CBC WITH DIFFERENTIAL/PLATELET
Abs Immature Granulocytes: 0.04 10*3/uL (ref 0.00–0.07)
Basophils Absolute: 0 10*3/uL (ref 0.0–0.1)
Basophils Relative: 0 %
Eosinophils Absolute: 0 10*3/uL (ref 0.0–0.5)
Eosinophils Relative: 0 %
HCT: 18.1 % — ABNORMAL LOW (ref 39.0–52.0)
Hemoglobin: 6.1 g/dL — CL (ref 13.0–17.0)
Immature Granulocytes: 1 %
Lymphocytes Relative: 28 %
Lymphs Abs: 0.9 10*3/uL (ref 0.7–4.0)
MCH: 29 pg (ref 26.0–34.0)
MCHC: 33.7 g/dL (ref 30.0–36.0)
MCV: 86.2 fL (ref 80.0–100.0)
Monocytes Absolute: 0.2 10*3/uL (ref 0.1–1.0)
Monocytes Relative: 5 %
Neutro Abs: 2.1 10*3/uL (ref 1.7–7.7)
Neutrophils Relative %: 66 %
Platelets: 224 10*3/uL (ref 150–400)
RBC: 2.1 MIL/uL — ABNORMAL LOW (ref 4.22–5.81)
RDW: 25.2 % — ABNORMAL HIGH (ref 11.5–15.5)
WBC: 3.1 10*3/uL — ABNORMAL LOW (ref 4.0–10.5)
nRBC: 1.3 % — ABNORMAL HIGH (ref 0.0–0.2)

## 2022-03-26 LAB — MAGNESIUM: Magnesium: 1 mg/dL — ABNORMAL LOW (ref 1.7–2.4)

## 2022-03-26 LAB — RETICULOCYTES
Immature Retic Fract: 22.2 % — ABNORMAL HIGH (ref 2.3–15.9)
RBC.: 1.9 MIL/uL — ABNORMAL LOW (ref 4.22–5.81)
Retic Count, Absolute: 46 10*3/uL (ref 19.0–186.0)
Retic Ct Pct: 2.4 % (ref 0.4–3.1)

## 2022-03-26 LAB — IRON AND TIBC
Iron: 227 ug/dL — ABNORMAL HIGH (ref 45–182)
Saturation Ratios: 89 % — ABNORMAL HIGH (ref 17.9–39.5)
TIBC: 255 ug/dL (ref 250–450)
UIBC: 28 ug/dL

## 2022-03-26 LAB — FOLATE: Folate: 2 ng/mL — ABNORMAL LOW (ref >5.9)

## 2022-03-26 LAB — VITAMIN B12: Vitamin B-12: 321 pg/mL (ref 180–914)

## 2022-03-26 LAB — T-HELPER CELLS (CD4) COUNT (NOT AT ARMC)
CD4 % Helper T Cell: 13 % — ABNORMAL LOW (ref 33–65)
CD4 T Cell Abs: 95 /uL — ABNORMAL LOW (ref 400–1790)

## 2022-03-26 LAB — ABO/RH: ABO/RH(D): B POS

## 2022-03-26 LAB — FERRITIN: Ferritin: 470 ng/mL — ABNORMAL HIGH (ref 24–336)

## 2022-03-26 LAB — PHOSPHORUS: Phosphorus: 4.1 mg/dL (ref 2.5–4.6)

## 2022-03-26 LAB — POC OCCULT BLOOD, ED: Fecal Occult Bld: NEGATIVE

## 2022-03-26 MED ORDER — BICTEGRAVIR-EMTRICITAB-TENOFOV 50-200-25 MG PO TABS
1.0000 | ORAL_TABLET | Freq: Every day | ORAL | Status: DC
  Administered 2022-03-26 – 2022-03-30 (×5): 1 via ORAL
  Filled 2022-03-26 (×6): qty 1

## 2022-03-26 MED ORDER — FLUCONAZOLE 100 MG PO TABS
100.0000 mg | ORAL_TABLET | Freq: Every day | ORAL | Status: DC
  Administered 2022-03-26 – 2022-03-30 (×5): 100 mg via ORAL
  Filled 2022-03-26 (×6): qty 1

## 2022-03-26 MED ORDER — LORAZEPAM 1 MG PO TABS
1.0000 mg | ORAL_TABLET | ORAL | Status: AC | PRN
  Administered 2022-03-27 – 2022-03-28 (×2): 1 mg via ORAL
  Filled 2022-03-26 (×2): qty 1

## 2022-03-26 MED ORDER — ACETAMINOPHEN 325 MG PO TABS: 650.0000 mg | ORAL_TABLET | Freq: Four times a day (QID) | ORAL | Status: DC | PRN

## 2022-03-26 MED ORDER — THIAMINE MONONITRATE 100 MG PO TABS
100.0000 mg | ORAL_TABLET | Freq: Every day | ORAL | Status: DC
  Administered 2022-03-27 – 2022-03-30 (×4): 100 mg via ORAL
  Filled 2022-03-26 (×4): qty 1

## 2022-03-26 MED ORDER — POTASSIUM CHLORIDE 20 MEQ PO PACK
40.0000 meq | PACK | Freq: Once | ORAL | Status: AC
  Administered 2022-03-26: 40 meq via ORAL
  Filled 2022-03-26: qty 2

## 2022-03-26 MED ORDER — POTASSIUM CHLORIDE 10 MEQ/100ML IV SOLN
10.0000 meq | INTRAVENOUS | Status: AC
  Administered 2022-03-26 – 2022-03-27 (×6): 10 meq via INTRAVENOUS
  Filled 2022-03-26 (×6): qty 100

## 2022-03-26 MED ORDER — ONDANSETRON HCL 4 MG/2ML IJ SOLN: 4.0000 mg | Freq: Four times a day (QID) | INTRAMUSCULAR | Status: DC | PRN

## 2022-03-26 MED ORDER — SODIUM CHLORIDE 0.9% IV SOLUTION: Freq: Once | INTRAVENOUS | Status: AC

## 2022-03-26 MED ORDER — POTASSIUM CHLORIDE 10 MEQ/100ML IV SOLN
10.0000 meq | INTRAVENOUS | Status: AC
  Administered 2022-03-26 (×2): 10 meq via INTRAVENOUS
  Filled 2022-03-26 (×2): qty 100

## 2022-03-26 MED ORDER — MAGNESIUM SULFATE 2 GM/50ML IV SOLN
2.0000 g | Freq: Once | INTRAVENOUS | Status: AC
  Administered 2022-03-26: 2 g via INTRAVENOUS
  Filled 2022-03-26: qty 50

## 2022-03-26 MED ORDER — ADULT MULTIVITAMIN W/MINERALS CH
1.0000 | ORAL_TABLET | Freq: Every day | ORAL | Status: DC
  Administered 2022-03-26 – 2022-03-30 (×5): 1 via ORAL
  Filled 2022-03-26 (×5): qty 1

## 2022-03-26 MED ORDER — THIAMINE HCL 100 MG/ML IJ SOLN
100.0000 mg | Freq: Every day | INTRAMUSCULAR | Status: DC
  Administered 2022-03-26: 100 mg via INTRAVENOUS
  Filled 2022-03-26 (×2): qty 2

## 2022-03-26 MED ORDER — ONDANSETRON HCL 4 MG PO TABS: 4.0000 mg | ORAL_TABLET | Freq: Four times a day (QID) | ORAL | Status: DC | PRN

## 2022-03-26 MED ORDER — FOLIC ACID 1 MG PO TABS
1.0000 mg | ORAL_TABLET | Freq: Every day | ORAL | Status: DC
  Administered 2022-03-26 – 2022-03-28 (×3): 1 mg via ORAL
  Filled 2022-03-26 (×3): qty 1

## 2022-03-26 MED ORDER — ACETAMINOPHEN 650 MG RE SUPP: 650.0000 mg | Freq: Four times a day (QID) | RECTAL | Status: DC | PRN

## 2022-03-26 MED ORDER — LORAZEPAM 2 MG/ML IJ SOLN: 1.0000 mg | INTRAMUSCULAR | Status: AC | PRN

## 2022-03-26 MED ORDER — SULFAMETHOXAZOLE-TRIMETHOPRIM 800-160 MG PO TABS
1.0000 | ORAL_TABLET | Freq: Every day | ORAL | Status: DC
  Administered 2022-03-26 – 2022-03-30 (×5): 1 via ORAL
  Filled 2022-03-26 (×5): qty 1

## 2022-03-26 NOTE — ED Notes (Signed)
Lab called asking for a recollect of type and screen. Per lab tech it is the third time recollect.

## 2022-03-26 NOTE — Telephone Encounter (Signed)
Attempted to contact patient via phone today regarding lab results. Dr. Tommy Medal recommends patient go to ER ASAP due to potassium level was not detectable.   Staff also contacted other parties listed on contact demographics list, but no alternate phone for patient was provided. Pharmacy provided [phone], but this number was also not in service at this time.   Routing to Dr. Tommy Medal to make aware of attempts and sending patient a Mychart alert as well.  Eugenia Mcalpine, LPN

## 2022-03-26 NOTE — Telephone Encounter (Signed)
Patient's mother returned phone call and is aware that patient will need to go to the ED ASAP.

## 2022-03-26 NOTE — Telephone Encounter (Signed)
Received voice message in triage from patient's mother. IN the voice message she stated that patient currently did not have a phone and she will pass any message along to him that is needed. Attempted to call mother back, phone went to voicemail. Left mother voicemail stating Dr. Tommy Medal recommends patient go to ER regarding lab results. Trenton Gammon to call our office back once she received voice message.   Eugenia Mcalpine, LPN

## 2022-03-26 NOTE — ED Notes (Signed)
Patient ambulated to restroom, tolerated well. 

## 2022-03-26 NOTE — Assessment & Plan Note (Addendum)
Pt with prior treatment for syphilis, no titer available therefore not clear if this represents reinfection vs appropriate prior treatment. 1. Titer presumably pending still.

## 2022-03-26 NOTE — Assessment & Plan Note (Addendum)
Presumably the cause of mild bilirubin and transaminase elevations seen on labs this year including today. 1. CIWA

## 2022-03-26 NOTE — Telephone Encounter (Signed)
-----   Message from Truman Hayward, MD sent at 03/26/2022 10:14 AM EDT ----- His potassium was not detectable. He needs to go to the ER as soon as possible ----- Message ----- From: Interface, [facility] Lab Results In Sent: 03/26/2022   3:50 AM EDT To: Truman Hayward, MD

## 2022-03-26 NOTE — Progress Notes (Signed)
CSW added resources for SUD.

## 2022-03-26 NOTE — ED Provider Triage Note (Signed)
Emergency Medicine Provider Triage Evaluation Note  Harold Flores , a 31 y.o. male  was evaluated in triage.  Pt complains of abnormal lab test. Harold Flores to ID doctor yesterday for follow up and had blood work done. Called today and told his hemoglobin and potassium were very low and to go to the ER. No hx of GI bleed or blood transfusion. Has otherwise been feeling okay except fatigued. Some vomiting, no bloody emesis. No blood in stool.   Review of Systems  Positive: Fatigue, vomiting Negative: Abd pain, blood in stool  Physical Exam  BP 126/78 (BP Location: Right Arm)   Pulse (!) 117   Temp 98.9 F (37.2 C) (Oral)   Resp 18   SpO2 100%  Gen:   Awake, no distress   Resp:  Normal effort  MSK:   Moves extremities without difficulty  Other:    Medical Decision Making  Medically screening exam initiated at 4:32 PM.  Appropriate orders placed.  Harold Flores was informed that the remainder of the evaluation will be completed by another provider, this initial triage assessment does not replace that evaluation, and the importance of remaining in the ED until their evaluation is complete.  Per chart review, Hgb 6.4 and potassium <2.0. Will repeat and order type and screen   Lota Leamer T, PA-C 03/26/22 1632

## 2022-03-26 NOTE — ED Notes (Signed)
Reports lethargy, dark colored stools, nausea and diarrhea intermittently since last month. Pt reports drinking at least 4 standard alcohol drinks daily. Alert and oriented x 4. Seen by infectious disease MD yesterday for regular check up with labs showing low hemoglobin and low potassium. Sent to ED for further eval. Cardiac monitoring in place. Will continue to monitor.

## 2022-03-26 NOTE — Telephone Encounter (Signed)
Patient called back regarding mychart message. Relayed to patient that Dr. Tommy Medal would like for him to go to the ED as soon as possible due to potassium not being detected. Patient verbalized understanding and understands the urgency for this.  Leatrice Jewels, RMA

## 2022-03-26 NOTE — Assessment & Plan Note (Addendum)
Per ID note from yesterday: 1. Restart Biktarvy 2. Diflucan for Thrush 3. Daily bactrim 4. At risk for relapse of GBS

## 2022-03-26 NOTE — Assessment & Plan Note (Addendum)
Severe hypokalemia and hypomagnesemia 1. Got 2g mg sulfate + 2 runs IV K + 63meq PO K in ED 2. 2 additional g Mg sulfate ordered 3. 6 additional runs IV K ordered 4. Repeat CMP in AM 5. Tele monitor 6. Note that HypoK of this degree WOULD be expected to be symptomatic, ie causing generalized weakness. 1. Want to replace K and Mg first before even thinking about GBS in this ambulatory patient.

## 2022-03-26 NOTE — Assessment & Plan Note (Addendum)
Not sure if he will or wont be allowed to stay with Mother + Grandmother.

## 2022-03-26 NOTE — H&P (Signed)
History and Physical    Patient: Harold Flores ZOX:[id number] DOB: 12-16-1990 DOA: 03/26/2022 DOS: the patient was seen and examined on 03/26/2022 PCP: Corwin Levins, MD  Patient coming from: Homeless  Chief Complaint:  Chief Complaint  Patient presents with   Abnormal Lab   HPI: Harold Flores is a 31 y.o. male with medical history significant of HIV/AIDS, EtOH abuse, syphilis, GBS.  Pt seen by ID yesterday.  Recent problems with relapse of alcoholism.  He hasnt been taking his HAART.  Patient was evaluated by his PCP yesterday as he was feeling very fatigued, rundown over the last couple of days.  Reports he has had ongoing diarrhea over the past few months, this is at his baseline, does note his diarrhea is somewhat dark however just took up drinking and feels that this is likely due to him drinking daily.  He does endorse drinking 4 beers on a light day, more on a heavy day.  He is also had some vomiting but no blood in his emesis.  Patient did have GBS last year around the same time, reports he feels the same.  He was hospitalized and spent some time in the hospital as he was unable to walk last year.  Patient was also called by PCP and told that his potassium was less than 2, his hemoglobin was also low.  He denies any shortness of breath, chest pain, prior transfusion, prior hospitalizations for DTs, no history of withdrawal.  Review of Systems: As mentioned in the history of present illness. All other systems reviewed and are negative. Past Medical History:  Diagnosis Date   Anal fissure    Anal fistula    Anal ulcer    Asthma    Epistaxis    GAD (generalized anxiety disorder)    GERD (gastroesophageal reflux disease)    Hemorrhoid    HIV (human immunodeficiency virus infection) ([facility])    Hypertension    Syphilis    Thrush 03/25/2022   Varicella    Past Surgical History:  Procedure Laterality Date   EYE SURGERY  1994   blephroplasty right eye   RADIOLOGY WITH  ANESTHESIA N/A 08/08/2020   Procedure: MRI WITH ANESTHESIA- BRAIN WITH AND WITHOUT CONTRAST,  CERVICAL SPINE WITH AND WITHOUT CONTRAST, LUMBAR SPINE WITH WITHOUT CONTRAST, THORACIC SPINE WITH WITHOUT CONTRAST;  Surgeon: Radiologist, Medication, MD;  Location: [facility];  Service: Radiology;  Laterality: N/A;   Social History:  reports that he has been smoking e-cigarettes and cigarettes. He has a 2.50 pack-year smoking history. He has never used smokeless tobacco. He reports current alcohol use of about 7.0 standard drinks of alcohol per week. He reports current drug use. Drug: Marijuana.  No Known Allergies  Family History  Problem Relation Age of Onset   Diabetes Mother    Hyperlipidemia Mother    Diabetes Maternal Grandmother    Heart disease Paternal Grandfather    Cancer Neg Hx     Prior to Admission medications   Medication Sig Start Date End Date Taking? Authorizing Provider  carvedilol (COREG) 12.5 MG tablet TAKE 1 TABLET BY MOUTH 2 (TWO) TIMES DAILY. MUST KEEP SCHEDULED APPT FOR FUTURE REFILLS Patient taking differently: Take 12.5 mg by mouth daily. TAKE 1 TABLET BY MOUTH 2 (TWO) TIMES DAILY. MUST KEEP SCHEDULED APPT FOR FUTURE REFILLS 11/26/21  Yes Corwin Levins, MD  lansoprazole (PREVACID) 30 MG capsule Take 1 capsule (30 mg total) by mouth daily. 11/26/21  Yes Corwin Levins, MD  amLODipine (NORVASC) 10 MG tablet TAKE 1 TABLET (10 MG TOTAL) BY MOUTH DAILY. Patient not taking: Reported on 03/25/2022 11/26/21 11/26/22  Biagio Borg, MD  bictegravir-emtricitabine-tenofovir AF (BIKTARVY) 50-200-25 MG TABS tablet TAKE 1 TABLET BY MOUTH DAILY. Patient not taking: Reported on 03/26/2022 03/25/22 03/25/23  Tommy Medal, Lavell Islam, MD  fluconazole (DIFLUCAN) 100 MG tablet Take 1 tablet (100 mg total) by mouth daily. Patient not taking: Reported on 03/26/2022 03/25/22   Tommy Medal, Lavell Islam, MD  potassium chloride (KLOR-CON 10) 10 MEQ tablet Take 1 tablet (10 mEq total) by mouth daily. Patient not  taking: Reported on 03/25/2022 11/26/21   Biagio Borg, MD  sulfamethoxazole-trimethoprim (BACTRIM DS) 800-160 MG tablet TAKE 1 TABLET BY MOUTH DAILY. Patient not taking: Reported on 03/26/2022 03/25/22 03/25/23  Truman Hayward, MD    Physical Exam: Vitals:   03/26/22 1654 03/26/22 1730 03/26/22 1815 03/26/22 1830  BP: 126/80 122/67 125/70 120/64  Pulse:  (!) 108 (!) 111 (!) 106  Resp: (!) 23 12 (!) 24 19  Temp:      TempSrc:      SpO2:  99% 100% 100%   Constitutional: NAD, calm, comfortable Eyes: PERRL, lids and conjunctivae Pale ENMT: Thrush present Neck: normal, supple, no masses, no thyromegaly Respiratory: clear to auscultation bilaterally, no wheezing, no crackles. Normal respiratory effort. No accessory muscle use.  Cardiovascular: Tachycardic Abdomen: no tenderness, no masses palpated. No hepatosplenomegaly. Bowel sounds positive.  Musculoskeletal: no clubbing / cyanosis. No joint deformity upper and lower extremities. Good ROM, no contractures. Normal muscle tone.  Skin: no rashes, lesions, ulcers. No induration Neurologic: Generalized weakness but nothing focal.  Ambulatory today. Psychiatric: Normal judgment and insight. Alert and oriented x 3. Normal mood.   Data Reviewed:       Latest Ref Rng & Units 03/26/2022    4:33 PM 03/25/2022    3:01 PM 11/26/2021   10:40 AM  CBC  WBC 4.0 - 10.5 K/uL 3.1  2.7  4.4   Hemoglobin 13.0 - 17.0 g/dL 6.1  6.4  10.7   Hematocrit 39.0 - 52.0 % 18.1  18.4  31.4   Platelets 150 - 400 K/uL 224  237  267.0       Latest Ref Rng & Units 03/26/2022    4:33 PM 03/25/2022    3:01 PM 11/26/2021   10:40 AM  CMP  Glucose 70 - 99 mg/dL 107  93  105   BUN 6 - 20 mg/dL 5  6  3    Creatinine 0.61 - 1.24 mg/dL 0.97  0.76  0.75   Sodium 135 - 145 mmol/L 136  137  141   Potassium 3.5 - 5.1 mmol/L <2.0  <2.0  2.8   Chloride 98 - 111 mmol/L 81  82  93   CO2 22 - 32 mmol/L 39  44  39   Calcium 8.9 - 10.3 mg/dL 7.6  7.3  8.6   Total  Protein 6.5 - 8.1 g/dL 6.4  6.4  6.5   Total Bilirubin 0.3 - 1.2 mg/dL 1.7  1.6  1.3   Alkaline Phos 38 - 126 U/L 71   170   AST 15 - 41 U/L 56  55  217   ALT 0 - 44 U/L 21  18  76    CD4 of 95  RPR is reactive Titer is pending  Hemoccult neg  CXR neg  Assessment and Plan: * Anemia Cause entirely not clear, given neg  hemoccult.  Reports melena on history though. 1u PRBC transfusion ordered Hemoccult neg Previously had B12 and folate deficiency Anemia pnl pending Tele monitor  Hypokalemia Severe hypokalemia and hypomagnesemia Got 2g mg sulfate + 2 runs IV K + PO K in ED 2 additional g Mg sulfate ordered 6 additional runs IV K ordered Repeat CMP in AM Tele monitor Note that HypoK of this degree WOULD be expected to be symptomatic, ie causing generalized weakness. Want to replace K and Mg first before even thinking about GBS in this ambulatory patient.  Positive RPR test Pt with prior treatment for syphilis, no titer available therefore not clear if this represents reinfection vs appropriate prior treatment. Titer presumably pending still.  AIDS [facility]) Per ID note from yesterday: Restart Biktarvy Diflucan for Thrush Daily bactrim At risk for relapse of GBS  ETOH abuse Presumably the cause of mild bilirubin and transaminase elevations seen on labs this year including today. CIWA  Homeless Not sure if he will or wont be allowed to stay with Mother + Grandmother.      Advance Care Planning:   Code Status: Full Code  Consults: None  Family Communication: Grandmother at bedside  Severity of Illness: The appropriate patient status for this patient is OBSERVATION. Observation status is judged to be reasonable and necessary in order to provide the required intensity of service to ensure the patient's safety. The patient's presenting symptoms, physical exam findings, and initial radiographic and laboratory data in the context of their medical condition is felt  to place them at decreased risk for further clinical deterioration. Furthermore, it is anticipated that the patient will be medically stable for discharge from the hospital within 2 midnights of admission.   Author: Hillary Bow., DO 03/26/2022 8:04 PM  For on call review [email or web address].

## 2022-03-26 NOTE — Telephone Encounter (Signed)
-----   Message from Truman Hayward, MD sent at 03/26/2022 11:33 AM EDT ----- He also needs a blood transfusion ----- Message ----- From: [facility] Lab Results In Sent: 03/26/2022   3:50 AM EDT To: Truman Hayward, MD

## 2022-03-26 NOTE — ED Notes (Signed)
Blood consent e-signed by pt at this time. Risks and benefits explained by provider to pt.

## 2022-03-26 NOTE — Telephone Encounter (Signed)
-----   Message from Cornelius N Van Dam, MD sent at 03/26/2022 10:14 AM EDT ----- His potassium was not detectable. He needs to go to the ER as soon as possible ----- Message ----- From: Interface, [facility] Lab Results In Sent: 03/26/2022   3:50 AM EDT To: Cornelius N Van Dam, MD   

## 2022-03-26 NOTE — ED Notes (Signed)
Message received from Blood Bank blood is ready.

## 2022-03-26 NOTE — ED Notes (Signed)
Blood started

## 2022-03-26 NOTE — ED Notes (Signed)
Admitting provider at bedside.

## 2022-03-26 NOTE — ED Notes (Signed)
Patient denies pain and is resting comfortably.  

## 2022-03-26 NOTE — ED Provider Notes (Cosign Needed Addendum)
[facility] EMERGENCY DEPARTMENT Provider Note   CSN: [id number] Arrival date & time: 03/26/22  1518     History HIV, GBS Chief Complaint  Patient presents with   Abnormal Lab    Harold Flores is a 31 y.o. male.  31 year old male with a past medical history of HIV noncompliant with medication, syphilis, hypertension presents to the ED with a chief complaint of abnormal labs.  Patient was evaluated by his PCP yesterday as he was feeling very fatigued, rundown over the last couple of days.  Reports he has had ongoing diarrhea over the past few months, this is at his baseline, does note his diarrhea is somewhat dark however just took up drinking and feels that this is likely due to him drinking daily.  He does endorse drinking 4 beers on a light day, more on a heavy day.  He is also had some vomiting but no blood in his emesis.  Patient did have GBS last year around the same time, reports he feels the same.  He was hospitalized and spent some time in the hospital as he was unable to walk last year.  Patient was also called by PCP and told that his potassium was less than 2, his hemoglobin was also low.  He denies any shortness of breath, chest pain, prior transfusion. Prior hospitalizations for DTs, no history of withdrawal.    The history is provided by the patient and medical records.  Abnormal Lab Time since result:  6.8 Resulting agency:  External Result type: hematology   Hematology:    Hemoglobin:  Low      Home Medications Prior to Admission medications   Medication Sig Start Date End Date Taking? Authorizing Provider  carvedilol (COREG) 12.5 MG tablet TAKE 1 TABLET BY MOUTH 2 (TWO) TIMES DAILY. MUST KEEP SCHEDULED APPT FOR FUTURE REFILLS Patient taking differently: Take 12.5 mg by mouth daily. TAKE 1 TABLET BY MOUTH 2 (TWO) TIMES DAILY. MUST KEEP SCHEDULED APPT FOR FUTURE REFILLS 11/26/21  Yes Corwin Levins, MD  lansoprazole (PREVACID) 30 MG capsule  Take 1 capsule (30 mg total) by mouth daily. 11/26/21  Yes Corwin Levins, MD  amLODipine (NORVASC) 10 MG tablet TAKE 1 TABLET (10 MG TOTAL) BY MOUTH DAILY. Patient not taking: Reported on 03/25/2022 11/26/21 11/26/22  Corwin Levins, MD  bictegravir-emtricitabine-tenofovir AF (BIKTARVY) 50-200-25 MG TABS tablet TAKE 1 TABLET BY MOUTH DAILY. Patient not taking: Reported on 03/26/2022 03/25/22 03/25/23  Daiva Eves, Lisette Grinder, MD  fluconazole (DIFLUCAN) 100 MG tablet Take 1 tablet (100 mg total) by mouth daily. Patient not taking: Reported on 03/26/2022 03/25/22   Daiva Eves, Lisette Grinder, MD  potassium chloride (KLOR-CON 10) 10 MEQ tablet Take 1 tablet (10 mEq total) by mouth daily. Patient not taking: Reported on 03/25/2022 11/26/21   Corwin Levins, MD  sulfamethoxazole-trimethoprim (BACTRIM DS) 800-160 MG tablet TAKE 1 TABLET BY MOUTH DAILY. Patient not taking: Reported on 03/26/2022 03/25/22 03/25/23  Daiva Eves, Lisette Grinder, MD      Allergies    Patient has no known allergies.    Review of Systems   Review of Systems  Constitutional:  Negative for fever.  HENT:  Negative for sore throat.   Respiratory:  Negative for cough and shortness of breath.   Cardiovascular:  Negative for chest pain and leg swelling.  Gastrointestinal:  Positive for diarrhea, nausea and vomiting. Negative for abdominal pain.  Musculoskeletal:  Negative for back pain.  Neurological:  Negative  for dizziness, light-headedness and headaches.  All other systems reviewed and are negative.   Physical Exam Updated Vital Signs BP 115/60   Pulse (!) 110   Temp 98.3 F (36.8 C) (Oral)   Resp 20   SpO2 100%  Physical Exam Vitals and nursing note reviewed.  Constitutional:      Appearance: He is well-developed.  HENT:     Head: Normocephalic and atraumatic.  Eyes:     General: No scleral icterus.    Pupils: Pupils are equal, round, and reactive to light.     Comments: Conjunctivae is pale.  Cardiovascular:     Rate and  Rhythm: Normal rate.     Heart sounds: Normal heart sounds.  Pulmonary:     Effort: Pulmonary effort is normal.     Breath sounds: Normal breath sounds. No wheezing.  Chest:     Chest wall: No tenderness.  Abdominal:     General: Bowel sounds are normal. There is no distension.     Palpations: Abdomen is soft.     Tenderness: There is no abdominal tenderness.  Musculoskeletal:        General: No tenderness or deformity.     Cervical back: Normal range of motion and neck supple.     Right lower leg: No edema.     Left lower leg: No edema.  Skin:    General: Skin is warm and dry.  Neurological:     Mental Status: He is alert and oriented to person, place, and time.     Motor: No weakness.     Comments: Alert, oriented, thought content appropriate. Speech fluent without evidence of aphasia. Able to follow 2 step commands without difficulty.  Cranial Nerves:  II:  Peripheral visual fields grossly normal, pupils, round, reactive to light III,IV, VI: ptosis not present, extra-ocular motions intact bilaterally  V,VII: smile symmetric, facial light touch sensation equal VIII: hearing grossly normal bilaterally  IX,X: midline uvula rise  XI: bilateral shoulder shrug equal and strong XII: midline tongue extension  Motor:  5/5 in upper and lower extremities bilaterally including strong and equal grip strength and dorsiflexion/plantar flexion Sensory: light touch normal in all extremities.  Cerebellar: normal finger-to-nose with bilateral upper extremities, pronator drift negative Gait: normal gait and balance       ED Results / Procedures / Treatments   Labs (all labs ordered are listed, but only abnormal results are displayed) Labs Reviewed  CBC WITH DIFFERENTIAL/PLATELET - Abnormal; Notable for the following components:      Result Value   WBC 3.1 (*)    RBC 2.10 (*)    Hemoglobin 6.1 (*)    HCT 18.1 (*)    RDW 25.2 (*)    nRBC 1.3 (*)    All other components within normal  limits  COMPREHENSIVE METABOLIC PANEL - Abnormal; Notable for the following components:   Potassium <2.0 (*)    Chloride 81 (*)    CO2 39 (*)    Glucose, Bld 107 (*)    BUN <5 (*)    Calcium 7.6 (*)    Total Protein 6.4 (*)    Albumin 2.6 (*)    AST 56 (*)    Total Bilirubin 1.7 (*)    Anion gap 16 (*)    All other components within normal limits  MAGNESIUM - Abnormal; Notable for the following components:   Magnesium 1.0 (*)    All other components within normal limits  FOLATE - Abnormal; Notable for  the following components:   Folate 2.0 (*)    All other components within normal limits  IRON AND TIBC - Abnormal; Notable for the following components:   Iron 227 (*)    Saturation Ratios 89 (*)    All other components within normal limits  FERRITIN - Abnormal; Notable for the following components:   Ferritin 470 (*)    All other components within normal limits  RETICULOCYTES - Abnormal; Notable for the following components:   RBC. 1.90 (*)    Immature Retic Fract 22.2 (*)    All other components within normal limits  GASTROINTESTINAL PANEL BY PCR, STOOL (REPLACES STOOL CULTURE)  OVA + PARASITE EXAM  PHOSPHORUS  VITAMIN B12  OCCULT BLOOD X 1 CARD TO LAB, STOOL  T-HELPER CELLS (CD4) COUNT (NOT AT [facility])  CBC  COMPREHENSIVE METABOLIC PANEL  POC OCCULT BLOOD, ED  ABO/RH  PREPARE RBC (CROSSMATCH)  TYPE AND SCREEN    EKG EKG Interpretation  Date/Time:  Tuesday March 26 2022 16:57:34 EDT Ventricular Rate:  105 PR Interval:  121 QRS Duration: 106 QT Interval:  512 QTC Calculation: 677 R Axis:   100 Text Interpretation: Sinus tachycardia Right axis deviation Anteroseptal infarct, old Repol abnrm, severe global ischemia (LM/MVD) Prolonged QT interval New ST depressions and appearance of global ischemia Confirmed by Gareth Morgan [phone]) on 03/26/2022 5:59:09 PM  Radiology DG Chest Portable 1 View  Result Date: 03/26/2022 CLINICAL DATA:  Lethargy.  HIV positive.  EXAM: PORTABLE CHEST 1 VIEW COMPARISON:  Chest x-ray 08/03/2020 FINDINGS: The heart size and mediastinal contours are within normal limits. Both lungs are clear. The visualized skeletal structures are unremarkable. IMPRESSION: No active disease. Electronically Signed   By: Ronney Asters M.D.   On: 03/26/2022 18:13    Procedures .Critical Care  Performed by: Janeece Fitting, PA-C Authorized by: Janeece Fitting, PA-C   Critical care provider statement:    Critical care time (minutes):  45   Critical care start time:  03/26/2022 7:30 PM   Critical care end time:  03/26/2022 8:15 PM   Critical care was necessary to treat or prevent imminent or life-threatening deterioration of the following conditions:  Circulatory failure and metabolic crisis   Critical care was time spent personally by me on the following activities:  Discussions with primary provider, examination of patient and discussions with consultants   Care discussed with: admitting provider       Medications Ordered in ED Medications  potassium chloride 10 mEq in 100 mL IVPB (10 mEq Intravenous New Bag/Given 03/26/22 2030)  LORazepam (ATIVAN) tablet 1-4 mg (has no administration in time range)    Or  LORazepam (ATIVAN) injection 1-4 mg (has no administration in time range)  thiamine (VITAMIN B1) tablet 100 mg ( Oral See Alternative 03/26/22 2057)    Or  thiamine (VITAMIN B1) injection 100 mg (100 mg Intravenous Given 60/45/40 9811)  folic acid (FOLVITE) tablet 1 mg (1 mg Oral Given 03/26/22 2057)  multivitamin with minerals tablet 1 tablet (1 tablet Oral Given 03/26/22 2057)  magnesium sulfate IVPB 2 g 50 mL (2 g Intravenous New Bag/Given 03/26/22 2040)  bictegravir-emtricitabine-tenofovir AF (BIKTARVY) 50-200-25 MG per tablet 1 tablet (has no administration in time range)  fluconazole (DIFLUCAN) tablet 100 mg (has no administration in time range)  sulfamethoxazole-trimethoprim (BACTRIM DS) 800-160 MG per tablet 1 tablet (1 tablet Oral  Given 03/26/22 2057)  acetaminophen (TYLENOL) tablet 650 mg (has no administration in time range)    Or  acetaminophen (  TYLENOL) suppository 650 mg (has no administration in time range)  ondansetron (ZOFRAN) tablet 4 mg (has no administration in time range)    Or  ondansetron (ZOFRAN) injection 4 mg (has no administration in time range)  0.9 %  sodium chloride infusion (Manually program via Guardrails IV Fluids) ( Intravenous New Bag/Given 03/26/22 1824)  potassium chloride 10 mEq in 100 mL IVPB (0 mEq Intravenous Stopped 03/26/22 2017)  potassium chloride (KLOR-CON) packet 40 mEq (40 mEq Oral Given 03/26/22 1817)  magnesium sulfate IVPB 2 g 50 mL (0 g Intravenous Stopped 03/26/22 1917)    ED Course/ Medical Decision Making/ A&P Clinical Course as of 03/26/22 2059  Tue Mar 26, 2022  1742 Hemoglobin(!!): 6.1 [JS]  1849 Magnesium(!): 1.0 [JS]  1858 Potassium(!!): <2.0 Diarrhea for several weeks [JS]  1858 AST(!): 56 Drinking 3-4 beers daily [JS]    Clinical Course User Index [JS] Claude Manges, PA-C                           Medical Decision Making Amount and/or Complexity of Data Reviewed Labs: ordered. Decision-making details documented in ED Course. Radiology: ordered.  Risk Prescription drug management. Decision regarding hospitalization.    This patient presents to the ED for concern of fatigue, abnormal, this involves a number of treatment options, and is a complaint that carries with it a high risk of complications and morbidity.  The differential diagnosis includes anemia, opportunistic infections.    Co morbidities: Discussed in HPI   Brief History:  Patient with underlying HIV, noncompliant with medication here with weakness, abnormal labs, endorsing fatigue.  Abnormal labs at PCPs office remarkable for hypokalemia along with symptomatic anemia with a hemoglobin less than 7  EMR reviewed including pt PMHx, past surgical history and past visits to ER.   See  HPI for more details   Lab Tests:  I ordered and independently interpreted labs.  The pertinent results include:    Labs notable for CMP with hypokalemia of less than 2. Creatine is at his baseline. LFTS with slight elevation in AST, he does report drinking 4 beers on a light day and more so on a heavy day.  CBC remarkable for some leukopenia.  Hemoglobin is 6.1, he is unsure whether he had a prior transfusion in the past.  Hypomagnesemia, phosphorus is normal.  Hemoccult performed which was grossly negative, negative via lab.   Imaging Studies:  NAD. I personally reviewed all imaging studies and no acute abnormality found. I agree with radiology interpretation.  Cardiac Monitoring:  The patient was maintained on a cardiac monitor.  I personally viewed and interpreted the cardiac monitored which showed an underlying rhythm of: NSR  Some ST changes, no chest pain not short of breath, no prior cardiac history likely related to his Anemia.    Medicines ordered:  I ordered medication including Potassium  for hypokalemia.  Reevaluation of the patient after these medicines showed that the patient improved I have reviewed the patients home medicines and have made adjustments as needed   Critical Interventions:        Hemoglobin is low is at 6.1, given transfusion while in the ED with a unit order, but suspect he will likely need more blood.    Reevaluation:  After the interventions noted above I re-evaluated patient and found that they have :stayed the same   Social Determinants of Health:  The patient's social determinants of health were a factor in  the care of this patient    Problem List / ED Course:  Patient here with fatigue, abnormal labs evaluated by PCP yesterday which he went to as he has been feeling rundown.  Sent here for a potassium less than 2, a hemoglobin less than 7.  Arrives to the ED and ambulatory on arrival.  He is endorsing some lower extremity weakness,  does report a prior history of GBS approximately 1 year ago and similar symptoms.  No hematemesis, no melena.  Patient is HIV positive, noncompliant with medication and last took these in the month of July. Extensive chart review of patient's records, and I see visit from March 2022 where patient was admitted for the same with low potassium and early alcohol withdrawals.  He also had severe hypokalemia done along with pedal edema which is not present on today's exam.  He was treated previously for Katheran Awe, does feel bilateral weakness of his legs however he has good strength bilaterally and ambulated in the ED with a steady gait.  Work-up thus far reveals hypokalemia, hypomagnesemia, hemoglobin of 6.1, in which he is getting transfusion at this time.  He is hemodynamically stable.  Patient is EKG with some ST diffusions, according to visit on the same time of March 2022, he did have an elevated troponin likely due to LVH and his anemia back then. Considered opportunistic infections as he does have a prior history of HIV and is noncompliant with medication.  He is afebrile on today's visit, he is alert and oriented x4, he is got good strength to upper and lower extremities.  Chest x-ray without any acute finding.  Patient has had potassium replacement ordered, I do feel that he meets criteria for admission at this time for further management.  Call placed to hospitalist service.  Dispostion:  After consideration of the diagnostic results and the patients response to treatment, I feel that the patent would benefit from admission.    8:09 PM spoke to Dr. Lanae Boast of the hospitalist service who will admit patient for further management.  Appreciate his assistance, patient is hemodynamically stable.   Portions of this note were generated with Scientist, clinical (histocompatibility and immunogenetics). Dictation errors may occur despite best attempts at proofreading.   Final Clinical Impression(s) / ED Diagnoses Final diagnoses:   Symptomatic anemia  Hypomagnesemia  Hypokalemia    Rx / DC Orders ED Discharge Orders     None         Claude Manges, PA-C 03/26/22 2010    [address], PA-C 03/26/22 2059    Alvira Monday, MD 03/27/22 1117

## 2022-03-26 NOTE — ED Triage Notes (Addendum)
Pt sent here by PCP for low potassium <2.0 and low hemoglobin <7.0 that was drawn yesterday. Pt denies any bleeding, dark tarry stools. Pt reports feeling fatigued, states he hasn't been taking his HIV medication. Pt reports drinking approx 1-3 "large bootleggers" a day, no hx of DTs

## 2022-03-26 NOTE — Telephone Encounter (Signed)
We have tried to reach the patient and patient's mother mutiple times to let them know patient will need to go to the ED ASAP. We also left a voicemail for the mother to call back.  Harold Flores

## 2022-03-26 NOTE — Assessment & Plan Note (Addendum)
Cause entirely not clear, given neg hemoccult.  Reports melena on history though. 1. 1u PRBC transfusion ordered 2. Hemoccult neg 3. Previously had B12 and folate deficiency 4. Anemia pnl pending 5. Tele monitor

## 2022-03-27 ENCOUNTER — Other Ambulatory Visit (HOSPITAL_COMMUNITY): Payer: Self-pay

## 2022-03-27 DIAGNOSIS — Z5900 Homelessness unspecified: Secondary | ICD-10-CM | POA: Diagnosis not present

## 2022-03-27 DIAGNOSIS — J45909 Unspecified asthma, uncomplicated: Secondary | ICD-10-CM | POA: Diagnosis present

## 2022-03-27 DIAGNOSIS — Z91148 Patient's other noncompliance with medication regimen for other reason: Secondary | ICD-10-CM | POA: Diagnosis not present

## 2022-03-27 DIAGNOSIS — K219 Gastro-esophageal reflux disease without esophagitis: Secondary | ICD-10-CM | POA: Diagnosis present

## 2022-03-27 DIAGNOSIS — D649 Anemia, unspecified: Secondary | ICD-10-CM | POA: Diagnosis present

## 2022-03-27 DIAGNOSIS — D72819 Decreased white blood cell count, unspecified: Secondary | ICD-10-CM | POA: Diagnosis present

## 2022-03-27 DIAGNOSIS — B20 Human immunodeficiency virus [HIV] disease: Secondary | ICD-10-CM | POA: Diagnosis present

## 2022-03-27 DIAGNOSIS — Z8619 Personal history of other infectious and parasitic diseases: Secondary | ICD-10-CM | POA: Diagnosis not present

## 2022-03-27 DIAGNOSIS — A530 Latent syphilis, unspecified as early or late: Secondary | ICD-10-CM | POA: Diagnosis present

## 2022-03-27 DIAGNOSIS — F411 Generalized anxiety disorder: Secondary | ICD-10-CM | POA: Diagnosis present

## 2022-03-27 DIAGNOSIS — E876 Hypokalemia: Secondary | ICD-10-CM | POA: Diagnosis present

## 2022-03-27 DIAGNOSIS — Z79899 Other long term (current) drug therapy: Secondary | ICD-10-CM | POA: Diagnosis not present

## 2022-03-27 DIAGNOSIS — I10 Essential (primary) hypertension: Secondary | ICD-10-CM | POA: Diagnosis present

## 2022-03-27 DIAGNOSIS — F1010 Alcohol abuse, uncomplicated: Secondary | ICD-10-CM | POA: Diagnosis not present

## 2022-03-27 DIAGNOSIS — B370 Candidal stomatitis: Secondary | ICD-10-CM | POA: Diagnosis present

## 2022-03-27 DIAGNOSIS — F1020 Alcohol dependence, uncomplicated: Secondary | ICD-10-CM | POA: Diagnosis present

## 2022-03-27 DIAGNOSIS — D638 Anemia in other chronic diseases classified elsewhere: Secondary | ICD-10-CM | POA: Diagnosis present

## 2022-03-27 DIAGNOSIS — F17290 Nicotine dependence, other tobacco product, uncomplicated: Secondary | ICD-10-CM | POA: Diagnosis present

## 2022-03-27 DIAGNOSIS — Z8249 Family history of ischemic heart disease and other diseases of the circulatory system: Secondary | ICD-10-CM | POA: Diagnosis not present

## 2022-03-27 LAB — CBC
HCT: 19.5 % — ABNORMAL LOW (ref 39.0–52.0)
HCT: 21.3 % — ABNORMAL LOW (ref 39.0–52.0)
Hemoglobin: 6.8 g/dL — CL (ref 13.0–17.0)
Hemoglobin: 7.6 g/dL — ABNORMAL LOW (ref 13.0–17.0)
MCH: 29.4 pg (ref 26.0–34.0)
MCH: 28.9 pg (ref 26.0–34.0)
MCHC: 34.9 g/dL (ref 30.0–36.0)
MCHC: 35.7 g/dL (ref 30.0–36.0)
MCV: 84.4 fL (ref 80.0–100.0)
MCV: 81 fL (ref 80.0–100.0)
Platelets: 180 10*3/uL (ref 150–400)
Platelets: 165 10*3/uL (ref 150–400)
RBC: 2.31 MIL/uL — ABNORMAL LOW (ref 4.22–5.81)
RBC: 2.63 MIL/uL — ABNORMAL LOW (ref 4.22–5.81)
RDW: 22.7 % — ABNORMAL HIGH (ref 11.5–15.5)
RDW: 23.3 % — ABNORMAL HIGH (ref 11.5–15.5)
WBC: 3.4 10*3/uL — ABNORMAL LOW (ref 4.0–10.5)
WBC: 3.5 10*3/uL — ABNORMAL LOW (ref 4.0–10.5)
nRBC: 1.2 % — ABNORMAL HIGH (ref 0.0–0.2)
nRBC: 1.1 % — ABNORMAL HIGH (ref 0.0–0.2)

## 2022-03-27 LAB — GASTROINTESTINAL PANEL BY PCR, STOOL (REPLACES STOOL CULTURE)

## 2022-03-27 LAB — COMPREHENSIVE METABOLIC PANEL
ALT: 19 U/L (ref 0–44)
AST: 50 U/L — ABNORMAL HIGH (ref 15–41)
Albumin: 2.3 g/dL — ABNORMAL LOW (ref 3.5–5.0)
Alkaline Phosphatase: 62 U/L (ref 38–126)
Anion gap: 14 (ref 5–15)
BUN: <5 mg/dL — ABNORMAL LOW (ref 6–20)
CO2: 38 mmol/L — ABNORMAL HIGH (ref 22–32)
Calcium: 7.6 mg/dL — ABNORMAL LOW (ref 8.9–10.3)
Chloride: 86 mmol/L — ABNORMAL LOW (ref 98–111)
Creatinine, Ser: 0.86 mg/dL (ref 0.61–1.24)
GFR, Estimated: >60 mL/min (ref >60)
Glucose, Bld: 90 mg/dL (ref 70–99)
Potassium: <2 mmol/L — CL (ref 3.5–5.1)
Sodium: 138 mmol/L (ref 135–145)
Total Bilirubin: 1.7 mg/dL — ABNORMAL HIGH (ref 0.3–1.2)
Total Protein: 5.6 g/dL — ABNORMAL LOW (ref 6.5–8.1)

## 2022-03-27 LAB — PREPARE RBC (CROSSMATCH)

## 2022-03-27 LAB — MAGNESIUM
Magnesium: 1.9 mg/dL (ref 1.7–2.4)
Magnesium: 1.9 mg/dL (ref 1.7–2.4)

## 2022-03-27 LAB — T-HELPER CELLS (CD4) COUNT (NOT AT ARMC)
CD4 % Helper T Cell: 13 % — ABNORMAL LOW (ref 33–65)
CD4 T Cell Abs: 105 /uL — ABNORMAL LOW (ref 400–1790)

## 2022-03-27 LAB — POTASSIUM: Potassium: 2.1 mmol/L — CL (ref 3.5–5.1)

## 2022-03-27 LAB — C-REACTIVE PROTEIN: CRP: 0.5 mg/dL (ref <1.0)

## 2022-03-27 MED ORDER — MAGNESIUM SULFATE 2 GM/50ML IV SOLN
2.0000 g | Freq: Once | INTRAVENOUS | Status: AC
  Administered 2022-03-27: 2 g via INTRAVENOUS
  Filled 2022-03-27: qty 50

## 2022-03-27 MED ORDER — POTASSIUM CHLORIDE 10 MEQ/100ML IV SOLN
10.0000 meq | INTRAVENOUS | Status: AC
  Administered 2022-03-27 – 2022-03-28 (×6): 10 meq via INTRAVENOUS
  Filled 2022-03-27 (×6): qty 100

## 2022-03-27 MED ORDER — POTASSIUM CHLORIDE 10 MEQ/100ML IV SOLN
INTRAVENOUS | Status: AC
  Filled 2022-03-27: qty 100

## 2022-03-27 MED ORDER — POTASSIUM CHLORIDE 10 MEQ/100ML IV SOLN
10.0000 meq | INTRAVENOUS | Status: AC
  Administered 2022-03-27 (×6): 10 meq via INTRAVENOUS
  Filled 2022-03-27 (×5): qty 100

## 2022-03-27 MED ORDER — POTASSIUM CHLORIDE 10 MEQ/100ML IV SOLN: 10.0000 meq | INTRAVENOUS | Status: DC

## 2022-03-27 MED ORDER — ENSURE ENLIVE PO LIQD
237.0000 mL | Freq: Two times a day (BID) | ORAL | Status: DC
  Administered 2022-03-27 – 2022-03-28 (×2): 237 mL via ORAL

## 2022-03-27 MED ORDER — CARVEDILOL 12.5 MG PO TABS
12.5000 mg | ORAL_TABLET | Freq: Two times a day (BID) | ORAL | Status: DC
  Administered 2022-03-27 – 2022-03-30 (×7): 12.5 mg via ORAL
  Filled 2022-03-27 (×7): qty 1

## 2022-03-27 MED ORDER — POTASSIUM CHLORIDE CRYS ER 20 MEQ PO TBCR
40.0000 meq | EXTENDED_RELEASE_TABLET | ORAL | Status: AC
  Administered 2022-03-27 (×2): 40 meq via ORAL
  Filled 2022-03-27 (×2): qty 2

## 2022-03-27 MED ORDER — SODIUM CHLORIDE 0.9% IV SOLUTION: Freq: Once | INTRAVENOUS | Status: DC

## 2022-03-27 MED ORDER — VITAMIN B-12 1000 MCG PO TABS
1000.0000 ug | ORAL_TABLET | Freq: Every day | ORAL | Status: DC
  Administered 2022-03-27 – 2022-03-30 (×4): 1000 ug via ORAL
  Filled 2022-03-27 (×4): qty 1

## 2022-03-27 NOTE — Evaluation (Signed)
Physical Therapy Evaluation and Discharge Patient Details Name: Harold Flores MRN: [id number] DOB: 02-07-1991 Today's Date: 03/27/2022  History of Present Illness  Pt is a 31 y/o male admitted secondary to anemia and hypokalemia. PMH includes AIDS/HIV, alcohol abuse, GBS.  Clinical Impression  Patient evaluated by Physical Therapy with no further acute PT needs identified. All education has been completed and the patient has no further questions. Pt admitted secondary to problem above with deficits below. Pt overall at an independent with mobility tasks. Educated about walking program and general LE HEP. Pt reports he plans to stay with his grandmother at d/c. See below for any follow-up Physical Therapy or equipment needs. PT is signing off. Thank you for this referral. If needs change, please re-consult.         Recommendations for follow up therapy are one component of a multi-disciplinary discharge planning process, led by the attending physician.  Recommendations may be updated based on patient status, additional functional criteria and insurance authorization.  Follow Up Recommendations No PT follow up      Assistance Recommended at Discharge PRN  Patient can return home with the following       Equipment Recommendations None recommended by PT  Recommendations for Other Services       Functional Status Assessment Patient has had a recent decline in their functional status and demonstrates the ability to make significant improvements in function in a reasonable and predictable amount of time.     Precautions / Restrictions Precautions Precautions: None Restrictions Weight Bearing Restrictions: No      Mobility  Bed Mobility               General bed mobility comments: Sitting EOB    Transfers Overall transfer level: Independent                      Ambulation/Gait Ambulation/Gait assistance: Independent Gait Distance (Feet): 400 Feet Assistive  device: IV Pole Gait Pattern/deviations: Step-through pattern Gait velocity: WFL     General Gait Details: Overall steady gait. No LOB noted. Educated about walking program to perform at home  Stairs            Wheelchair Mobility    Modified Rankin (Stroke Patients Only)       Balance Overall balance assessment: No apparent balance deficits (not formally assessed)                                           Pertinent Vitals/Pain Pain Assessment Pain Assessment: No/denies pain    Home Living Family/patient expects to be discharged to:: Private residence Living Arrangements: Other relatives (grandmother) Available Help at Discharge: Family Type of Home: House Home Access: Stairs to enter Entrance Stairs-Rails: None Secretary/administrator of Steps: 1-2   Home Layout: One level Home Equipment: None      Prior Function Prior Level of Function : Independent/Modified Independent                     Hand Dominance        Extremity/Trunk Assessment   Upper Extremity Assessment Upper Extremity Assessment: Overall WFL for tasks assessed    Lower Extremity Assessment Lower Extremity Assessment: Overall WFL for tasks assessed    Cervical / Trunk Assessment Cervical / Trunk Assessment: Normal  Communication   Communication: No difficulties  Cognition Arousal/Alertness:  Awake/alert Behavior During Therapy: WFL for tasks assessed/performed Overall Cognitive Status: Within Functional Limits for tasks assessed                                          General Comments General comments (skin integrity, edema, etc.): Educated about generalized walking program and LE HEP including marching, squats, etc    Exercises     Assessment/Plan    PT Assessment Patient does not need any further PT services  PT Problem List         PT Treatment Interventions      PT Goals (Current goals can be found in the Care Plan  section)  Acute Rehab PT Goals PT Goal Formulation: All assessment and education complete, DC therapy Time For Goal Achievement: 03/27/22 Potential to Achieve Goals: Good    Frequency       Co-evaluation               AM-PAC PT "6 Clicks" Mobility  Outcome Measure Help needed turning from your back to your side while in a flat bed without using bedrails?: None Help needed moving from lying on your back to sitting on the side of a flat bed without using bedrails?: None Help needed moving to and from a bed to a chair (including a wheelchair)?: None Help needed standing up from a chair using your arms (e.g., wheelchair or bedside chair)?: None Help needed to walk in hospital room?: None Help needed climbing 3-5 steps with a railing? : None 6 Click Score: 24    End of Session   Activity Tolerance: Patient tolerated treatment well Patient left: in bed;with call bell/phone within reach Nurse Communication: Mobility status PT Visit Diagnosis: Muscle weakness (generalized) (M62.81)    Time: 6546-5035 PT Time Calculation (min) (ACUTE ONLY): 17 min   Charges:   PT Evaluation $PT Eval Low Complexity: 1 Low          Lou Miner, DPT  Acute Rehabilitation Services  Office: [phone]   Rudean Hitt 03/27/2022, 4:38 PM

## 2022-03-27 NOTE — Progress Notes (Signed)
PROGRESS NOTE    Harold Flores  BSW:[id number] DOB: Feb 05, 1991 DOA: 03/26/2022 PCP: Corwin Levins, MD   Brief Narrative:  31 y.o. male with medical history significant of HIV/AIDS with noncompliance with medications, alcohol abuse, syphilis, GBS presented with abnormal labs and weakness.  He was seen in the ID clinic a day prior to presentation and was restarted on Biktarvy, Bactrim and started on Diflucan as well.  On presentation, he was found to be severely hypokalemic with potassium of less than 2, hypomagnesemic with hemoglobin of 6.1.  He was transfused packed red cells and electrolyte supplementation was done.  Assessment & Plan:   Severe hypokalemia -Presented with potassium of less than 2.  Potassium still less than 2 this morning.  Continue IV and oral replacement.  Repeat potassium level this afternoon  Hypomagnesemia -Resolved  Anemia -Possibly from anemia of chronic disease from HIV/AIDS -Status post 1 unit packed red cell transfusion.  Hemoglobin stable low at 6.8.  Transfuse 1 more unit of packed red cells.  Monitor H&H  Leukopenia -Possibly from HIV/AIDS.  Monitor intermittently  HIV/AIDS -Patient was recently restarted on Biktarvy and Bactrim.  CD4 count of 95.  Outpatient follow-up with ID  Oral thrush -Continue Diflucan which was started by ID as an outpatient recently  Folate deficiency -Folic acid level of 2.  Continue supplementation.  Positive RPR -History of treatment for syphilis in the past.  Outpatient follow-up with ID.  Alcohol abuse -No signs of withdrawal.  Continue CIWA protocol along with thiamine, folic acid and multivitamin.  Diarrhea -Chronic.  Rate controlled currently.  GI PCR negative.  Homelessness -TOC consult  DVT prophylaxis: Early ambulation Code Status: Full Family Communication: None at bedside Disposition Plan: Status is: Observation The patient will require care spanning > 2 midnights and should be moved to  inpatient because: Of severity of illness.  Still hypokalemic    Consultants: None  Procedures: None  Antimicrobials:  Anti-infectives (From admission, onward)    Start     Dose/Rate Route Frequency Ordered Stop   03/26/22 1930  bictegravir-emtricitabine-tenofovir AF (BIKTARVY) 50-200-25 MG per tablet 1 tablet        1 tablet Oral Daily 03/26/22 1929     03/26/22 1930  fluconazole (DIFLUCAN) tablet 100 mg        100 mg Oral Daily 03/26/22 1929     03/26/22 1930  sulfamethoxazole-trimethoprim (BACTRIM DS) 800-160 MG per tablet 1 tablet        1 tablet Oral Daily 03/26/22 1929          Subjective: Patient seen and examined at bedside.  Feels that his weakness is slightly improving.  Denies any black or bloody stools, no fever, chest pain, shortness of breath reported.  Objective: Vitals:   03/26/22 2223 03/26/22 2225 03/26/22 2359 03/27/22 0521  BP: 125/73  122/63 (!) 135/92  Pulse: (!) 105  (!) 105 98  Resp: 20  16 18   Temp: 99.1 F (37.3 C)  99.1 F (37.3 C) 98.8 F (37.1 C)  TempSrc: Oral  Oral Oral  SpO2: 100%  100% 92%  Weight:  80.3 kg    Height:  5\' 10"  (1.778 m)      Intake/Output Summary (Last 24 hours) at 03/27/2022 1026 Last data filed at 03/27/2022 0600 Gross per 24 hour  Intake 1611.53 ml  Output --  Net 1611.53 ml   Filed Weights   03/26/22 2225  Weight: 80.3 kg    Examination:  General exam: Appears  calm and comfortable.  On room air Respiratory system: Bilateral decreased breath sounds at bases, no wheezing Cardiovascular system: S1 & S2 heard, tachycardic  gastrointestinal system: Abdomen is nondistended, soft and nontender. Normal bowel sounds heard. Extremities: No cyanosis, clubbing, edema  Central nervous system: Alert and oriented. No focal neurological deficits. Moving extremities Skin: No rashes, lesions or ulcers Psychiatry: Judgement and insight appear normal. Mood & affect appropriate.     Data Reviewed: I have personally  reviewed following labs and imaging studies  CBC: Recent Labs  Lab 03/25/22 1501 03/26/22 1633 03/27/22 0604  WBC 2.7* 3.1* 3.4*  NEUTROABS 1,782 2.1  --   HGB 6.4* 6.1* 6.8*  HCT 18.4* 18.1* 19.5*  MCV 84.8 86.2 84.4  PLT 237 224 180   Basic Metabolic Panel: Recent Labs  Lab 03/25/22 1501 03/26/22 1633 03/27/22 0604  NA 137 136 138  K <2.0* <2.0* <2.0*  CL 82* 81* 86*  CO2 44* 39* 38*  GLUCOSE 93 107* 90  BUN 6* <5* <5*  CREATININE 0.76 0.97 0.86  CALCIUM 7.3* 7.6* 7.6*  MG  --  1.0* 1.9  PHOS  --  4.1  --    GFR: Estimated Creatinine Clearance: 129.7 mL/min (by C-G formula based on SCr of 0.86 mg/dL). Liver Function Tests: Recent Labs  Lab 03/25/22 1501 03/26/22 1633 03/27/22 0604  AST 55* 56* 50*  ALT 18 21 19   ALKPHOS  --  71 62  BILITOT 1.6* 1.7* 1.7*  PROT 6.4 6.4* 5.6*  ALBUMIN  --  2.6* 2.3*   No results for input(s): "LIPASE", "AMYLASE" in the last 168 hours. No results for input(s): "AMMONIA" in the last 168 hours. Coagulation Profile: No results for input(s): "INR", "PROTIME" in the last 168 hours. Cardiac Enzymes: No results for input(s): "CKTOTAL", "CKMB", "CKMBINDEX", "TROPONINI" in the last 168 hours. BNP (last 3 results) No results for input(s): "PROBNP" in the last 8760 hours. HbA1C: No results for input(s): "HGBA1C" in the last 72 hours. CBG: No results for input(s): "GLUCAP" in the last 168 hours. Lipid Profile: Recent Labs    03/25/22 1501  CHOL 84  HDL 21*  LDLCALC 45  TRIG 92  CHOLHDL 4.0   Thyroid Function Tests: No results for input(s): "TSH", "T4TOTAL", "FREET4", "T3FREE", "THYROIDAB" in the last 72 hours. Anemia Panel: Recent Labs    03/26/22 1825  VITAMINB12 321  FOLATE 2.0*  FERRITIN 470*  TIBC 255  IRON 227*  RETICCTPCT 2.4   Sepsis Labs: No results for input(s): "PROCALCITON", "LATICACIDVEN" in the last 168 hours.  Recent Results (from the past 240 hour(s))  Gastrointestinal Panel by PCR , Stool      Status: None   Collection Time: 03/26/22 11:20 PM   Specimen: Stool  Result Value Ref Range Status   Campylobacter species NOT DETECTED NOT DETECTED Final   Plesimonas shigelloides NOT DETECTED NOT DETECTED Final   Salmonella species NOT DETECTED NOT DETECTED Final   Yersinia enterocolitica NOT DETECTED NOT DETECTED Final   Vibrio species NOT DETECTED NOT DETECTED Final   Vibrio cholerae NOT DETECTED NOT DETECTED Final   Enteroaggregative E coli (EAEC) NOT DETECTED NOT DETECTED Final   Enteropathogenic E coli (EPEC) NOT DETECTED NOT DETECTED Final   Enterotoxigenic E coli (ETEC) NOT DETECTED NOT DETECTED Final   Shiga like toxin producing E coli (STEC) NOT DETECTED NOT DETECTED Final   Shigella/Enteroinvasive E coli (EIEC) NOT DETECTED NOT DETECTED Final   Cryptosporidium NOT DETECTED NOT DETECTED Final   Cyclospora  cayetanensis NOT DETECTED NOT DETECTED Final   Entamoeba histolytica NOT DETECTED NOT DETECTED Final   Giardia lamblia NOT DETECTED NOT DETECTED Final   Adenovirus F40/41 NOT DETECTED NOT DETECTED Final   Astrovirus NOT DETECTED NOT DETECTED Final   Norovirus GI/GII NOT DETECTED NOT DETECTED Final   Rotavirus A NOT DETECTED NOT DETECTED Final   Sapovirus (I, II, IV, and V) NOT DETECTED NOT DETECTED Final    Comment: Performed at [facility], [address] [place], [place] [address]         Radiology Studies: DG Chest Portable 1 View  Result Date: 03/26/2022 CLINICAL DATA:  Lethargy.  HIV positive. EXAM: PORTABLE CHEST 1 VIEW COMPARISON:  Chest x-ray 08/03/2020 FINDINGS: The heart size and mediastinal contours are within normal limits. Both lungs are clear. The visualized skeletal structures are unremarkable. IMPRESSION: No active disease. Electronically Signed   By: Ronney Asters M.D.   On: 03/26/2022 18:13        Scheduled Meds:  sodium chloride   Intravenous Once   bictegravir-emtricitabine-tenofovir AF  1 tablet Oral Daily   vitamin B-12  1,000  mcg Oral Daily   fluconazole  100 mg Oral Daily   folic acid  1 mg Oral Daily   multivitamin with minerals  1 tablet Oral Daily   potassium chloride  40 mEq Oral Q4H   sulfamethoxazole-trimethoprim  1 tablet Oral Daily   thiamine  100 mg Oral Daily   Or   thiamine  100 mg Intravenous Daily   Continuous Infusions:  potassium chloride 10 mEq (03/27/22 0746)          Aline August, MD [facility] 03/27/2022, 10:26 AM

## 2022-03-27 NOTE — Progress Notes (Signed)
Initial Nutrition Assessment  DOCUMENTATION CODES:   Not applicable  INTERVENTION:   Ensure Enlive po BID, each supplement provides 350 kcal and 20 grams of protein.  Continue MVI with Minerals Continue Folate 1mg  daily for folate deficiency Add B12 1000 mcg daily for possible deficiency, levels low normal with hx of deficiency  NUTRITION DIAGNOSIS:   Inadequate oral intake related to decreased appetite as evidenced by per patient/family report.  GOAL:   Patient will meet greater than or equal to 90% of their needs  MONITOR:   PO intake, Supplement acceptance, Labs, Weight trends  REASON FOR ASSESSMENT:   Malnutrition Screening Tool    ASSESSMENT:   31 yo male admitted with severe hypokalemia and anemia. PMH includes HIV/AIDS with noncompliance with meds, EtOH abuse, syphillis, GBS.  Pt reports appetite is fair currently and was fair PTA. Pt reports he gets irritated/frustrated easilt and when this happens he just doesn't eat. Pt frustrated on visit today regarding the constant beeping from the IV pump.   Pt reports recent weight loss; believes he has lost 10-15 pounds over the last 1-2 months. Current wt 80.3 kg. Noted weight of 88.4 kg in June. 9% wt loss  Micronutrient Panel: Iron: 227 (H) Ferritin: 470 (H) Folate: 2.0 (L) Vitamin B12: 321 CRP: 0.4 (wdl)  Labs: potassium <2.0 (L), magnesium 1.9, Creatinine wdl Meds: folic acid, thiamine, MVI, KCl, mag sulfate  NUTRITION - FOCUSED PHYSICAL EXAM:  Deferred, pt frustrated and declined at this time  Diet Order:   Diet Order             Diet regular Room service appropriate? Yes; Fluid consistency: Thin  Diet effective now                   EDUCATION NEEDS:   Education needs have been addressed  Skin:  Skin Assessment: Reviewed RN Assessment  Last BM:  10/17 +small liquid BM  Height:   Ht Readings from Last 1 Encounters:  03/26/22 5\' 10"  (1.778 m)    Weight:   Wt Readings from Last 1  Encounters:  03/26/22 80.3 kg    BMI:  Body mass index is 25.4 kg/m.  Estimated Nutritional Needs:   Kcal:  2200-2400 kcals  Protein:  110-120 g  Fluid:  >/= 2 L   Kerman Passey MS, RDN, LDN, CNSC Registered Dietitian 3 Clinical Nutrition RD Pager and On-Call Pager Number Located in [place]

## 2022-03-27 NOTE — TOC Initial Note (Signed)
Transition of Care [facility]) - Initial/Assessment Note    Patient Details  Name: Harold Flores MRN: [id number] Date of Birth: [birth year]  Transition of Care [facility]) CM/SW Contact:    Durenda Guthrie, RN Phone Number: 03/27/2022, 1:33 PM  Clinical Narrative:    Transition of Care Screening Note:  Transition of Care Department [facility]) has reviewed patient and no TOC needs have been identified at this time. We will continue to monitor patient advancement through Interdisciplinary progressions. If new patient transition needs arise, please place a consult.                        Patient Goals and CMS Choice        Expected Discharge Plan and Services                                                Prior Living Arrangements/Services                       Activities of Daily Living Home Assistive Devices/Equipment: None ADL Screening (condition at time of admission) Patient's cognitive ability adequate to safely complete daily activities?: Yes Is the patient deaf or have difficulty hearing?: No Does the patient have difficulty seeing, even when wearing glasses/contacts?: No Does the patient have difficulty concentrating, remembering, or making decisions?: No Patient able to express need for assistance with ADLs?: Yes Does the patient have difficulty dressing or bathing?: No Independently performs ADLs?: Yes (appropriate for developmental age) Does the patient have difficulty walking or climbing stairs?: No Weakness of Legs: None Weakness of Arms/Hands: None  Permission Sought/Granted                  Emotional Assessment              Admission diagnosis:  Hypokalemia [E87.6] Hypomagnesemia [E83.42] Symptomatic anemia [D64.9] Patient Active Problem List   Diagnosis Date Noted   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   Homeless 03/26/2022   Thrush 03/25/2022   Gastroesophageal reflux disease    GBS (Guillain Barre syndrome) (HCC)    Leukopenia     Anemia    AIDS (HCC)    Sinus tachycardia    Essential hypertension    Transaminitis    ETOH abuse    Progressive focal motor weakness 08/15/2020   Acute encephalopathy    AMS (altered mental status)    Encephalopathy    Normocytic anemia 08/09/2020   Diarrhea 08/09/2020   Hypomagnesemia 08/09/2020   Hyponatremia 08/09/2020   Folic acid deficiency 08/09/2020   Lower extremity weakness 08/08/2020   Acute metabolic encephalopathy 08/07/2020   Hypokalemia 08/03/2020   Paresthesia of both lower extremities 07/23/2020   Peripheral edema 07/23/2020   Abnormal ECG 07/23/2020   Gait disorder 07/20/2020   Smoker 09/08/2018   Medication monitoring encounter 09/08/2018   Vitamin D deficiency disease 04/29/2018   Screening examination for venereal disease 06/18/2016   HIV (human immunodeficiency virus infection) (HCC) 02/22/2015   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 [place] [address] Phone: [phone] Fax: [phone]     Social Determinants of Health (SDOH) Interventions    Readmission Risk Interventions     No data to display

## 2022-03-28 DIAGNOSIS — B20 Human immunodeficiency virus [HIV] disease: Secondary | ICD-10-CM | POA: Diagnosis not present

## 2022-03-28 DIAGNOSIS — D649 Anemia, unspecified: Secondary | ICD-10-CM | POA: Diagnosis not present

## 2022-03-28 DIAGNOSIS — Z5900 Homelessness unspecified: Secondary | ICD-10-CM | POA: Diagnosis not present

## 2022-03-28 LAB — COMPREHENSIVE METABOLIC PANEL
ALT: 18 U/L (ref 0–44)
AST: 48 U/L — ABNORMAL HIGH (ref 15–41)
Albumin: 2.1 g/dL — ABNORMAL LOW (ref 3.5–5.0)
Alkaline Phosphatase: 59 U/L (ref 38–126)
Anion gap: 8 (ref 5–15)
BUN: <5 mg/dL — ABNORMAL LOW (ref 6–20)
CO2: 36 mmol/L — ABNORMAL HIGH (ref 22–32)
Calcium: 7.6 mg/dL — ABNORMAL LOW (ref 8.9–10.3)
Chloride: 93 mmol/L — ABNORMAL LOW (ref 98–111)
Creatinine, Ser: 0.92 mg/dL (ref 0.61–1.24)
GFR, Estimated: >60 mL/min (ref >60)
Glucose, Bld: 95 mg/dL (ref 70–99)
Potassium: 2 mmol/L — CL (ref 3.5–5.1)
Sodium: 137 mmol/L (ref 135–145)
Total Bilirubin: 1.3 mg/dL — ABNORMAL HIGH (ref 0.3–1.2)
Total Protein: 5.1 g/dL — ABNORMAL LOW (ref 6.5–8.1)

## 2022-03-28 LAB — CBC WITH DIFFERENTIAL/PLATELET
Abs Immature Granulocytes: 0 10*3/uL (ref 0.00–0.07)
Absolute Monocytes: 119 cells/uL — ABNORMAL LOW (ref 200–950)
Basophils Absolute: 0 cells/uL (ref 0–200)
Basophils Absolute: 0 10*3/uL (ref 0.0–0.1)
Basophils Relative: 0 %
Basophils Relative: 0 %
Eosinophils Absolute: 19 cells/uL (ref 15–500)
Eosinophils Absolute: 0 10*3/uL (ref 0.0–0.5)
Eosinophils Relative: 0.7 %
Eosinophils Relative: 0 %
HCT: 18.4 % — ABNORMAL LOW (ref 38.5–50.0)
HCT: 19 % — ABNORMAL LOW (ref 39.0–52.0)
Hemoglobin: 6.4 g/dL — ABNORMAL LOW (ref 13.2–17.1)
Hemoglobin: 6.9 g/dL — CL (ref 13.0–17.0)
Lymphocytes Relative: 23 %
Lymphs Abs: 780 cells/uL — ABNORMAL LOW (ref 850–3900)
Lymphs Abs: 0.7 10*3/uL (ref 0.7–4.0)
MCH: 29.5 pg (ref 27.0–33.0)
MCH: 28.9 pg (ref 26.0–34.0)
MCHC: 34.8 g/dL (ref 32.0–36.0)
MCHC: 36.3 g/dL — ABNORMAL HIGH (ref 30.0–36.0)
MCV: 84.8 fL (ref 80.0–100.0)
MCV: 79.5 fL — ABNORMAL LOW (ref 80.0–100.0)
MPV: 11.5 fL (ref 7.5–12.5)
Monocytes Absolute: 0.1 10*3/uL (ref 0.1–1.0)
Monocytes Relative: 4.4 %
Monocytes Relative: 2 %
Myelocytes: 1 %
Neutro Abs: 1782 cells/uL (ref 1500–7800)
Neutro Abs: 2.3 10*3/uL (ref 1.7–7.7)
Neutrophils Relative %: 66 %
Neutrophils Relative %: 74 %
Platelets: 237 10*3/uL (ref 140–400)
Platelets: 160 10*3/uL (ref 150–400)
RBC: 2.17 10*6/uL — ABNORMAL LOW (ref 4.20–5.80)
RBC: 2.39 MIL/uL — ABNORMAL LOW (ref 4.22–5.81)
RDW: 25 % — ABNORMAL HIGH (ref 11.0–15.0)
RDW: 23.7 % — ABNORMAL HIGH (ref 11.5–15.5)
Total Lymphocyte: 28.9 %
WBC: 2.7 10*3/uL — ABNORMAL LOW (ref 3.8–10.8)
WBC: 3.1 10*3/uL — ABNORMAL LOW (ref 4.0–10.5)
nRBC: 1 /100 WBC — ABNORMAL HIGH
nRBC: 1.3 % — ABNORMAL HIGH (ref 0.0–0.2)

## 2022-03-28 LAB — C DIFFICILE QUICK SCREEN W PCR REFLEX
C Diff antigen: NEGATIVE
C Diff interpretation: NOT DETECTED
C Diff toxin: NEGATIVE

## 2022-03-28 LAB — CBC
HCT: 22.5 % — ABNORMAL LOW (ref 39.0–52.0)
Hemoglobin: 8 g/dL — ABNORMAL LOW (ref 13.0–17.0)
MCH: 29.5 pg (ref 26.0–34.0)
MCHC: 35.6 g/dL (ref 30.0–36.0)
MCV: 83 fL (ref 80.0–100.0)
Platelets: 155 10*3/uL (ref 150–400)
RBC: 2.71 MIL/uL — ABNORMAL LOW (ref 4.22–5.81)
RDW: 22.5 % — ABNORMAL HIGH (ref 11.5–15.5)
WBC: 4.5 10*3/uL (ref 4.0–10.5)
nRBC: 0.9 % — ABNORMAL HIGH (ref 0.0–0.2)

## 2022-03-28 LAB — RPR TITER: RPR Titer: 1:2 {titer} — ABNORMAL HIGH

## 2022-03-28 LAB — LIPID PANEL
Cholesterol: 84 mg/dL (ref <200)
HDL: 21 mg/dL — ABNORMAL LOW (ref >=40)
LDL Cholesterol (Calc): 45 mg/dL (calc)
Non-HDL Cholesterol (Calc): 63 mg/dL (calc) (ref <130)
Total CHOL/HDL Ratio: 4 (calc) (ref <5.0)
Triglycerides: 92 mg/dL (ref <150)

## 2022-03-28 LAB — COMPLETE METABOLIC PANEL WITH GFR
AG Ratio: 0.9 (calc) — ABNORMAL LOW (ref 1.0–2.5)
ALT: 18 U/L (ref 9–46)
AST: 55 U/L — ABNORMAL HIGH (ref 10–40)
Albumin: 3.1 g/dL — ABNORMAL LOW (ref 3.6–5.1)
Alkaline phosphatase (APISO): 79 U/L (ref 36–130)
BUN/Creatinine Ratio: 8 (calc) (ref 6–22)
BUN: 6 mg/dL — ABNORMAL LOW (ref 7–25)
CO2: 44 mmol/L — ABNORMAL HIGH (ref 20–32)
Calcium: 7.3 mg/dL — ABNORMAL LOW (ref 8.6–10.3)
Chloride: 82 mmol/L — ABNORMAL LOW (ref 98–110)
Creat: 0.76 mg/dL (ref 0.60–1.26)
Globulin: 3.3 g/dL (calc) (ref 1.9–3.7)
Glucose, Bld: 93 mg/dL (ref 65–99)
Potassium: <2 mmol/L — CL (ref 3.5–5.3)
Sodium: 137 mmol/L (ref 135–146)
Total Bilirubin: 1.6 mg/dL — ABNORMAL HIGH (ref 0.2–1.2)
Total Protein: 6.4 g/dL (ref 6.1–8.1)
eGFR: 124 mL/min/{1.73_m2} (ref >=60)

## 2022-03-28 LAB — MAGNESIUM: Magnesium: 2.2 mg/dL (ref 1.7–2.4)

## 2022-03-28 LAB — HIV RNA, RTPCR W/R GT (RTI, PI,INT)

## 2022-03-28 LAB — PREPARE RBC (CROSSMATCH)

## 2022-03-28 LAB — CBC MORPHOLOGY

## 2022-03-28 LAB — FLUORESCENT TREPONEMAL AB(FTA)-IGG-BLD: Fluorescent Treponemal ABS: REACTIVE — AB

## 2022-03-28 LAB — ALCOHOL, METHYL (METHANOL), BLOOD: Methanol Lvl: NOT DETECTED mg/dL

## 2022-03-28 LAB — RPR: RPR Ser Ql: REACTIVE — AB

## 2022-03-28 MED ORDER — NICOTINE POLACRILEX 2 MG MT GUM
2.0000 mg | CHEWING_GUM | OROMUCOSAL | Status: DC | PRN
  Administered 2022-03-28 – 2022-03-30 (×5): 2 mg via ORAL
  Filled 2022-03-28 (×9): qty 1

## 2022-03-28 MED ORDER — PANTOPRAZOLE SODIUM 40 MG PO TBEC
40.0000 mg | DELAYED_RELEASE_TABLET | Freq: Every day | ORAL | Status: DC
  Administered 2022-03-28 – 2022-03-30 (×3): 40 mg via ORAL
  Filled 2022-03-28 (×3): qty 1

## 2022-03-28 MED ORDER — SODIUM CHLORIDE 0.9% IV SOLUTION: Freq: Once | INTRAVENOUS | Status: DC

## 2022-03-28 MED ORDER — POTASSIUM CHLORIDE 10 MEQ/100ML IV SOLN
10.0000 meq | INTRAVENOUS | Status: AC
  Administered 2022-03-28 (×6): 10 meq via INTRAVENOUS
  Filled 2022-03-28 (×6): qty 100

## 2022-03-28 MED ORDER — POTASSIUM CHLORIDE CRYS ER 20 MEQ PO TBCR
40.0000 meq | EXTENDED_RELEASE_TABLET | ORAL | Status: AC
  Administered 2022-03-28 (×2): 40 meq via ORAL
  Filled 2022-03-28 (×2): qty 2

## 2022-03-28 NOTE — Progress Notes (Signed)
PROGRESS NOTE    Harold Flores  KYH:[id number] DOB: 03-08-1991 DOA: 03/26/2022 PCP: Corwin Levins, MD   Brief Narrative:  31 y.o. male with medical history significant of HIV/AIDS with noncompliance with medications, alcohol abuse, syphilis, GBS presented with abnormal labs and weakness.  He was seen in the ID clinic a day prior to presentation and was restarted on Biktarvy, Bactrim and started on Diflucan as well.  On presentation, he was found to be severely hypokalemic with potassium of less than 2, hypomagnesemic with hemoglobin of 6.1.  He was transfused packed red cells and electrolyte supplementation was done.  Assessment & Plan:   Severe hypokalemia -Presented with potassium of less than 2.  Potassium still  2 this morning.  Continue IV and oral replacement.  Repeat potassium level this afternoon and in a.m.  Hypomagnesemia -Resolved  Anemia -Possibly from anemia of chronic disease from HIV/AIDS -Status post 2 units packed red cell transfusion.  Hemoglobin 6.9 this morning.  Transfuse 1 more unit of packed red cells.  Monitor H&H  Leukopenia -Possibly from HIV/AIDS.  Monitor intermittently  HIV/AIDS -Patient was recently restarted on Biktarvy and Bactrim.  CD4 count of 95.  Outpatient follow-up with ID  Oral thrush -Continue Diflucan which was started by ID as an outpatient recently  Folate deficiency -Folic acid level of 2.  Continue supplementation.  Positive RPR -History of treatment for syphilis in the past.  Outpatient follow-up with ID.  Alcohol abuse -No signs of withdrawal.  Continue CIWA protocol along with thiamine, folic acid and multivitamin.  Diarrhea -Chronic.  Stool for C. difficile pending.  GI PCR negative.  Homelessness -TOC consult  DVT prophylaxis: Early ambulation Code Status: Full Family Communication: None at bedside Disposition Plan: Status is: inpatient because: Of severity of illness.  Still hypokalemic    Consultants:  None  Procedures: None  Antimicrobials:  Anti-infectives (From admission, onward)    Start     Dose/Rate Route Frequency Ordered Stop   03/26/22 1930  bictegravir-emtricitabine-tenofovir AF (BIKTARVY) 50-200-25 MG per tablet 1 tablet        1 tablet Oral Daily 03/26/22 1929     03/26/22 1930  fluconazole (DIFLUCAN) tablet 100 mg        100 mg Oral Daily 03/26/22 1929     03/26/22 1930  sulfamethoxazole-trimethoprim (BACTRIM DS) 800-160 MG per tablet 1 tablet        1 tablet Oral Daily 03/26/22 1929          Subjective: Patient seen and examined at bedside.  Still having some diarrhea but improving.  Weakness is much improved.  No fever, worsening shortness of breath, vomiting reported. Objective: Vitals:   03/27/22 1344 03/27/22 1424 03/27/22 2241 03/28/22 0654  BP: 134/86 (!) 133/90 126/75 (!) 120/94  Pulse: (!) 102 96 98 90  Resp: 16 18 16 18   Temp: 98.9 F (37.2 C) 98.7 F (37.1 C) 98.6 F (37 C) 98.7 F (37.1 C)  TempSrc: Oral Oral Oral Oral  SpO2: 100% 100% 100% 100%  Weight:      Height:       No intake or output data in the 24 hours ending 03/28/22 0800  Filed Weights   03/26/22 2225  Weight: 80.3 kg    Examination:  General: On room air.  No distress. ENT/neck: No thyromegaly.  JVD is not elevated  respiratory: Decreased breath sounds at bases bilaterally with some crackles; no wheezing  CVS: S1-S2 heard, rate controlled currently Abdominal: Soft, nontender,  slightly distended; no organomegaly, bowel sounds are heard Extremities: Trace lower extremity edema; no cyanosis  CNS: Awake and alert.  No focal neurologic deficit.  Moves extremities Lymph: No obvious lymphadenopathy Skin: No obvious ecchymosis/lesions  psych: Affect, judgment and mood are normal  musculoskeletal: No obvious joint swelling/deformity     Data Reviewed: I have personally reviewed following labs and imaging studies  CBC: Recent Labs  Lab 03/25/22 1501 03/26/22 1633  03/27/22 0604 03/27/22 1840 03/28/22 0348  WBC 2.7* 3.1* 3.4* 3.5* 3.1*  NEUTROABS 1,782 2.1  --   --  2.3  HGB 6.4* 6.1* 6.8* 7.6* 6.9*  HCT 18.4* 18.1* 19.5* 21.3* 19.0*  MCV 84.8 86.2 84.4 81.0 79.5*  PLT 237 224 180 165 353    Basic Metabolic Panel: Recent Labs  Lab 03/25/22 1501 03/26/22 1633 03/27/22 0604 03/27/22 1840 03/28/22 0348  NA 137 136 138  --  137  K <2.0* <2.0* <2.0* 2.1* 2.0*  CL 82* 81* 86*  --  93*  CO2 44* 39* 38*  --  36*  GLUCOSE 93 107* 90  --  95  BUN 6* <5* <5*  --  <5*  CREATININE 0.76 0.97 0.86  --  0.92  CALCIUM 7.3* 7.6* 7.6*  --  7.6*  MG  --  1.0* 1.9 1.9 2.2  PHOS  --  4.1  --   --   --     GFR: Estimated Creatinine Clearance: 121.2 mL/min (by C-G formula based on SCr of 0.92 mg/dL). Liver Function Tests: Recent Labs  Lab 03/25/22 1501 03/26/22 1633 03/27/22 0604 03/28/22 0348  AST 55* 56* 50* 48*  ALT 18 21 19 18   ALKPHOS  --  71 62 59  BILITOT 1.6* 1.7* 1.7* 1.3*  PROT 6.4 6.4* 5.6* 5.1*  ALBUMIN  --  2.6* 2.3* 2.1*    No results for input(s): "LIPASE", "AMYLASE" in the last 168 hours. No results for input(s): "AMMONIA" in the last 168 hours. Coagulation Profile: No results for input(s): "INR", "PROTIME" in the last 168 hours. Cardiac Enzymes: No results for input(s): "CKTOTAL", "CKMB", "CKMBINDEX", "TROPONINI" in the last 168 hours. BNP (last 3 results) No results for input(s): "PROBNP" in the last 8760 hours. HbA1C: No results for input(s): "HGBA1C" in the last 72 hours. CBG: No results for input(s): "GLUCAP" in the last 168 hours. Lipid Profile: Recent Labs    03/25/22 1501  CHOL 84  HDL 21*  LDLCALC 45  TRIG 92  CHOLHDL 4.0    Thyroid Function Tests: No results for input(s): "TSH", "T4TOTAL", "FREET4", "T3FREE", "THYROIDAB" in the last 72 hours. Anemia Panel: Recent Labs    03/26/22 1825  VITAMINB12 321  FOLATE 2.0*  FERRITIN 470*  TIBC 255  IRON 227*  RETICCTPCT 2.4    Sepsis Labs: No  results for input(s): "PROCALCITON", "LATICACIDVEN" in the last 168 hours.  Recent Results (from the past 240 hour(s))  Gastrointestinal Panel by PCR , Stool     Status: None   Collection Time: 03/26/22 11:20 PM   Specimen: Stool  Result Value Ref Range Status   Campylobacter species NOT DETECTED NOT DETECTED Final   Plesimonas shigelloides NOT DETECTED NOT DETECTED Final   Salmonella species NOT DETECTED NOT DETECTED Final   Yersinia enterocolitica NOT DETECTED NOT DETECTED Final   Vibrio species NOT DETECTED NOT DETECTED Final   Vibrio cholerae NOT DETECTED NOT DETECTED Final   Enteroaggregative E coli (EAEC) NOT DETECTED NOT DETECTED Final   Enteropathogenic E coli (EPEC)  NOT DETECTED NOT DETECTED Final   Enterotoxigenic E coli (ETEC) NOT DETECTED NOT DETECTED Final   Shiga like toxin producing E coli (STEC) NOT DETECTED NOT DETECTED Final   Shigella/Enteroinvasive E coli (EIEC) NOT DETECTED NOT DETECTED Final   Cryptosporidium NOT DETECTED NOT DETECTED Final   Cyclospora cayetanensis NOT DETECTED NOT DETECTED Final   Entamoeba histolytica NOT DETECTED NOT DETECTED Final   Giardia lamblia NOT DETECTED NOT DETECTED Final   Adenovirus F40/41 NOT DETECTED NOT DETECTED Final   Astrovirus NOT DETECTED NOT DETECTED Final   Norovirus GI/GII NOT DETECTED NOT DETECTED Final   Rotavirus A NOT DETECTED NOT DETECTED Final   Sapovirus (I, II, IV, and V) NOT DETECTED NOT DETECTED Final    Comment: Performed at [facility], [address], [place], [place] [address]         Radiology Studies: DG Chest Portable 1 View  Result Date: 03/26/2022 CLINICAL DATA:  Lethargy.  HIV positive. EXAM: PORTABLE CHEST 1 VIEW COMPARISON:  Chest x-ray 08/03/2020 FINDINGS: The heart size and mediastinal contours are within normal limits. Both lungs are clear. The visualized skeletal structures are unremarkable. IMPRESSION: No active disease. Electronically Signed   By: Darliss Cheney M.D.   On:  03/26/2022 18:13        Scheduled Meds:  sodium chloride   Intravenous Once   sodium chloride   Intravenous Once   bictegravir-emtricitabine-tenofovir AF  1 tablet Oral Daily   carvedilol  12.5 mg Oral BID WC   vitamin B-12  1,000 mcg Oral Daily   feeding supplement  237 mL Oral BID BM   fluconazole  100 mg Oral Daily   folic acid  1 mg Oral Daily   multivitamin with minerals  1 tablet Oral Daily   potassium chloride  40 mEq Oral Q4H   sulfamethoxazole-trimethoprim  1 tablet Oral Daily   thiamine  100 mg Oral Daily   Or   thiamine  100 mg Intravenous Daily   Continuous Infusions:  potassium chloride            Glade Lloyd, MD [facility] 03/28/2022, 8:00 AM

## 2022-03-28 NOTE — Progress Notes (Signed)
Mobility Specialist Progress Note:   03/28/22 1445  Mobility  Activity Ambulated independently in hallway  Level of Assistance Modified independent, requires aide device or extra time  Assistive Device Other (Comment) (IV Pole)  Distance Ambulated (ft) 500 ft  Activity Response Tolerated well  Mobility Referral Yes  $Mobility charge 1 Mobility   Pt agreeable to mobility session. C/o bilat ankle stiffness upon standing, subsided after ambulation. Pt back in bed with all needs met, encouraged frequent OOB mobility.  Nelta Numbers Acute Rehab Secure Chat or Office Phone: [phone]

## 2022-03-29 DIAGNOSIS — D649 Anemia, unspecified: Secondary | ICD-10-CM | POA: Diagnosis not present

## 2022-03-29 DIAGNOSIS — B20 Human immunodeficiency virus [HIV] disease: Secondary | ICD-10-CM | POA: Diagnosis not present

## 2022-03-29 DIAGNOSIS — F1010 Alcohol abuse, uncomplicated: Secondary | ICD-10-CM | POA: Diagnosis not present

## 2022-03-29 DIAGNOSIS — Z5900 Homelessness unspecified: Secondary | ICD-10-CM | POA: Diagnosis not present

## 2022-03-29 LAB — POTASSIUM
Potassium: 2.8 mmol/L — ABNORMAL LOW (ref 3.5–5.1)
Potassium: 3.4 mmol/L — ABNORMAL LOW (ref 3.5–5.1)

## 2022-03-29 LAB — CBC WITH DIFFERENTIAL/PLATELET
Abs Immature Granulocytes: 0.06 10*3/uL (ref 0.00–0.07)
Basophils Absolute: 0 10*3/uL (ref 0.0–0.1)
Basophils Relative: 0 %
Eosinophils Absolute: 0 10*3/uL (ref 0.0–0.5)
Eosinophils Relative: 1 %
HCT: 20.6 % — ABNORMAL LOW (ref 39.0–52.0)
Hemoglobin: 7.4 g/dL — ABNORMAL LOW (ref 13.0–17.0)
Immature Granulocytes: 1 %
Lymphocytes Relative: 38 %
Lymphs Abs: 1.6 10*3/uL (ref 0.7–4.0)
MCH: 29.4 pg (ref 26.0–34.0)
MCHC: 35.9 g/dL (ref 30.0–36.0)
MCV: 81.7 fL (ref 80.0–100.0)
Monocytes Absolute: 0.4 10*3/uL (ref 0.1–1.0)
Monocytes Relative: 9 %
Neutro Abs: 2.2 10*3/uL (ref 1.7–7.7)
Neutrophils Relative %: 51 %
Platelets: 150 10*3/uL (ref 150–400)
RBC: 2.52 MIL/uL — ABNORMAL LOW (ref 4.22–5.81)
RDW: 22.2 % — ABNORMAL HIGH (ref 11.5–15.5)
WBC: 4.2 10*3/uL (ref 4.0–10.5)
nRBC: 1.2 % — ABNORMAL HIGH (ref 0.0–0.2)

## 2022-03-29 LAB — TYPE AND SCREEN
ABO/RH(D): B POS
Antibody Screen: NEGATIVE
Unit division: 0
Unit division: 0
Unit division: 0

## 2022-03-29 LAB — BPAM RBC
Blood Product Expiration Date: 202310252359
Blood Product Expiration Date: 202310262359
Blood Product Expiration Date: 202311042359
ISSUE DATE / TIME: 202310172041
ISSUE DATE / TIME: 202310181349
ISSUE DATE / TIME: 202310191736
Unit Type and Rh: 1700
Unit Type and Rh: 1700
Unit Type and Rh: 7300

## 2022-03-29 LAB — BASIC METABOLIC PANEL
Anion gap: 9 (ref 5–15)
BUN: <5 mg/dL — ABNORMAL LOW (ref 6–20)
CO2: 32 mmol/L (ref 22–32)
Calcium: 7.8 mg/dL — ABNORMAL LOW (ref 8.9–10.3)
Chloride: 100 mmol/L (ref 98–111)
Creatinine, Ser: 0.93 mg/dL (ref 0.61–1.24)
GFR, Estimated: >60 mL/min (ref >60)
Glucose, Bld: 103 mg/dL — ABNORMAL HIGH (ref 70–99)
Potassium: 2.6 mmol/L — CL (ref 3.5–5.1)
Sodium: 141 mmol/L (ref 135–145)

## 2022-03-29 LAB — MAGNESIUM
Magnesium: 1.4 mg/dL — ABNORMAL LOW (ref 1.7–2.4)
Magnesium: 1.6 mg/dL — ABNORMAL LOW (ref 1.7–2.4)

## 2022-03-29 MED ORDER — POTASSIUM CHLORIDE CRYS ER 20 MEQ PO TBCR
40.0000 meq | EXTENDED_RELEASE_TABLET | ORAL | Status: AC
  Administered 2022-03-29 (×2): 40 meq via ORAL
  Filled 2022-03-29 (×2): qty 2

## 2022-03-29 MED ORDER — MAGNESIUM SULFATE 2 GM/50ML IV SOLN
2.0000 g | Freq: Once | INTRAVENOUS | Status: AC
  Administered 2022-03-29: 2 g via INTRAVENOUS
  Filled 2022-03-29: qty 50

## 2022-03-29 MED ORDER — POTASSIUM CHLORIDE 10 MEQ/100ML IV SOLN
10.0000 meq | INTRAVENOUS | Status: AC
  Administered 2022-03-29 (×6): 10 meq via INTRAVENOUS
  Filled 2022-03-29 (×6): qty 100

## 2022-03-29 MED ORDER — FOLIC ACID 1 MG PO TABS
2.0000 mg | ORAL_TABLET | Freq: Every day | ORAL | Status: DC
  Administered 2022-03-29 – 2022-03-30 (×2): 2 mg via ORAL
  Filled 2022-03-29 (×2): qty 2

## 2022-03-29 NOTE — TOC Progression Note (Signed)
Transition of Care [facility]) - Initial/Assessment Note    Patient Details  Name: Harold Flores MRN: [id number] Date of Birth: [birth year]  Transition of Care [facility]) CM/SW Contact:    Milinda Antis, Shannon Phone Number: 03/29/2022, 3:28 PM  Clinical Narrative:                 LCSW received notification from [facility] that patient was in need of housing resources.  LCSW went to meet with the patient at bedside and the patient was indisposed at the time.    TOC will continue to follow and follow up with patient at a later time.    Patient Goals and CMS Choice        Expected Discharge Plan and Services                                                Prior Living Arrangements/Services                       Activities of Daily Living Home Assistive Devices/Equipment: None ADL Screening (condition at time of admission) Patient's cognitive ability adequate to safely complete daily activities?: Yes Is the patient deaf or have difficulty hearing?: No Does the patient have difficulty seeing, even when wearing glasses/contacts?: No Does the patient have difficulty concentrating, remembering, or making decisions?: No Patient able to express need for assistance with ADLs?: Yes Does the patient have difficulty dressing or bathing?: No Independently performs ADLs?: Yes (appropriate for developmental age) Does the patient have difficulty walking or climbing stairs?: No Weakness of Legs: None Weakness of Arms/Hands: None  Permission Sought/Granted                  Emotional Assessment              Admission diagnosis:  Hypokalemia [E87.6] Hypomagnesemia [E83.42] Symptomatic anemia [D64.9] Patient Active Problem List   Diagnosis Date Noted   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   Homeless 03/26/2022   Thrush 03/25/2022   Gastroesophageal reflux disease    GBS (Guillain Barre syndrome) (HCC)    Leukopenia    Anemia    AIDS ([place])    Sinus tachycardia     Essential hypertension    Transaminitis    ETOH abuse    Progressive focal motor weakness 08/15/2020   Acute encephalopathy    AMS (altered mental status)    Encephalopathy    Normocytic anemia 08/09/2020   Diarrhea 08/09/2020   Hypomagnesemia 08/09/2020   Hyponatremia 71/69/6789   Folic acid deficiency 38/03/1750   Lower extremity weakness 02/58/5277   Acute metabolic encephalopathy 82/42/3536   Hypokalemia 08/03/2020   Paresthesia of both lower extremities 07/23/2020   Peripheral edema 07/23/2020   Abnormal ECG 07/23/2020   Gait disorder 07/20/2020   Smoker 09/08/2018   Medication monitoring encounter 09/08/2018   Vitamin D deficiency disease 04/29/2018   Screening examination for venereal disease 06/18/2016   HIV (human immunodeficiency virus infection) ([place]) 02/22/2015   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 [place] [address] Phone: [phone] Fax: [phone]     Social Determinants of Health (SDOH) Interventions    Readmission Risk Interventions     No data to display

## 2022-03-29 NOTE — TOC Progression Note (Signed)
Transition of Care [facility]) - Progression Note    Patient Details  Name: Harold Flores MRN: [id number] Date of Birth: [birth year]  Transition of Care [facility]) CM/SW Contact  Tom-Johnson, Renea Ee, RN Phone Number: 03/29/2022, 3:10 PM  Clinical Narrative:     CM received a call from [place] with [facility] PCP stating that patient will not have a home to be discharge to as his grand mother will be going out of town. States patient needs housing resources or placement for a safe discharge. CM notified LCSW. TOC will continue to follow as patient progresses towards discharge.        Expected Discharge Plan and Services                                                 Social Determinants of Health (SDOH) Interventions    Readmission Risk Interventions     No data to display

## 2022-03-29 NOTE — Progress Notes (Signed)
PROGRESS NOTE    Harold Flores  WCH:[id number] DOB: 1991-01-29 DOA: 03/26/2022 PCP: Biagio Borg, MD   Brief Narrative:  31 y.o. male with medical history significant of HIV/AIDS with noncompliance with medications, alcohol abuse, syphilis, GBS presented with abnormal labs and weakness.  He was seen in the ID clinic a day prior to presentation and was restarted on Biktarvy, Bactrim and started on Diflucan as well.  On presentation, he was found to be severely hypokalemic with potassium of less than 2, hypomagnesemic with hemoglobin of 6.1.  He was transfused packed red cells and electrolyte supplementation was done.  Assessment & Plan:   Severe hypokalemia -Presented with potassium of less than 2.  Potassium still  2.6 this morning.  Continue IV and oral replacement.  Repeat potassium level this evening and in a.m.  Hypomagnesemia -Replace.  Repeat a.m. labs.  Anemia -Possibly from anemia of chronic disease from HIV/AIDS -Status post 3 units packed red cell transfusion.  Hemoglobin 7.4 this morning.  Monitor H&H  Leukopenia -Resolved  HIV/AIDS -Patient was recently restarted on Biktarvy and Bactrim.  CD4 count of 95.  Outpatient follow-up with ID  Oral thrush -Continue Diflucan which was started by ID as an outpatient recently  Folate deficiency -Folic acid level of 2.  Continue supplementation.  Positive RPR -History of treatment for syphilis in the past.  Outpatient follow-up with ID.  Alcohol abuse -No signs of withdrawal.  Continue CIWA protocol along with thiamine, folic acid and multivitamin.  Diarrhea -Chronic.  Stool for C. difficile negative.  GI PCR negative.  Use Imodium as needed.  DC isolation.  Homelessness -TOC consult  DVT prophylaxis: Early ambulation Code Status: Full Family Communication: None at bedside Disposition Plan: Status is: inpatient because: Of severity of illness.  Still hypokalemic    Consultants: None  Procedures:  None  Antimicrobials:  Anti-infectives (From admission, onward)    Start     Dose/Rate Route Frequency Ordered Stop   03/26/22 1930  bictegravir-emtricitabine-tenofovir AF (BIKTARVY) 50-200-25 MG per tablet 1 tablet        1 tablet Oral Daily 03/26/22 1929     03/26/22 1930  fluconazole (DIFLUCAN) tablet 100 mg        100 mg Oral Daily 03/26/22 1929     03/26/22 1930  sulfamethoxazole-trimethoprim (BACTRIM DS) 800-160 MG per tablet 1 tablet        1 tablet Oral Daily 03/26/22 1929          Subjective: Patient seen and examined at bedside.  No fever, chest pain, worsening shortness of breath.  Still having diarrhea.   Objective: Vitals:   03/28/22 1718 03/28/22 1839 03/28/22 2059 03/29/22 0501  BP: 123/83 (!) 138/95 (!) 129/100 (!) 137/99  Pulse: 91 (!) 101 97 98  Resp: 16 14 18 18   Temp: 98.3 F (36.8 C) 98.9 F (37.2 C) 98.6 F (37 C) 98.4 F (36.9 C)  TempSrc: Oral Oral    SpO2:   99% 100%  Weight:      Height:        Intake/Output Summary (Last 24 hours) at 03/29/2022 0803 Last data filed at 03/28/2022 2059 Gross per 24 hour  Intake 976 ml  Output --  Net 976 ml    Filed Weights   03/26/22 2225  Weight: 80.3 kg    Examination:  General: No acute distress currently.  Still on room air.   ENT/neck: No JVD elevation noted or palpable neck masses respiratory: Bilateral decreased breath  sounds at bases with scattered crackles  CVS: Mostly rate controlled; S1 and S2 are heard  abdominal: Soft, nontender, distended mildly; no organomegaly, bowel sounds are heard normally Extremities: No clubbing; mild lower extremity edema present CNS: Alert and oriented.  No focal neurologic deficit.  Able to move extremities  lymph: No palpable cervical lymphadenopathy noted  skin: No obvious rashes/petechiae psych: Normal mood, affect and judgment  musculoskeletal: No obvious joint swelling/deformity     Data Reviewed: I have personally reviewed following labs and  imaging studies  CBC: Recent Labs  Lab 03/25/22 1501 03/26/22 1633 03/27/22 0604 03/27/22 1840 03/28/22 0348 03/28/22 2226 03/29/22 0405  WBC 2.7* 3.1* 3.4* 3.5* 3.1* 4.5 4.2  NEUTROABS 1,782 2.1  --   --  2.3  --  2.2  HGB 6.4* 6.1* 6.8* 7.6* 6.9* 8.0* 7.4*  HCT 18.4* 18.1* 19.5* 21.3* 19.0* 22.5* 20.6*  MCV 84.8 86.2 84.4 81.0 79.5* 83.0 81.7  PLT 237 224 180 165 160 155 150    Basic Metabolic Panel: Recent Labs  Lab 03/25/22 1501 03/26/22 1633 03/27/22 0604 03/27/22 1840 03/28/22 0348 03/28/22 2226 03/29/22 0405  NA 137 136 138  --  137  --  141  K <2.0* <2.0* <2.0* 2.1* 2.0* 2.8* 2.6*  CL 82* 81* 86*  --  93*  --  100  CO2 44* 39* 38*  --  36*  --  32  GLUCOSE 93 107* 90  --  95  --  103*  BUN 6* <5* <5*  --  <5*  --  <5*  CREATININE 0.76 0.97 0.86  --  0.92  --  0.93  CALCIUM 7.3* 7.6* 7.6*  --  7.6*  --  7.8*  MG  --  1.0* 1.9 1.9 2.2  --  1.4*  PHOS  --  4.1  --   --   --   --   --     GFR: Estimated Creatinine Clearance: 119.9 mL/min (by C-G formula based on SCr of 0.93 mg/dL). Liver Function Tests: Recent Labs  Lab 03/25/22 1501 03/26/22 1633 03/27/22 0604 03/28/22 0348  AST 55* 56* 50* 48*  ALT 18 21 19 18   ALKPHOS  --  71 62 59  BILITOT 1.6* 1.7* 1.7* 1.3*  PROT 6.4 6.4* 5.6* 5.1*  ALBUMIN  --  2.6* 2.3* 2.1*    No results for input(s): "LIPASE", "AMYLASE" in the last 168 hours. No results for input(s): "AMMONIA" in the last 168 hours. Coagulation Profile: No results for input(s): "INR", "PROTIME" in the last 168 hours. Cardiac Enzymes: No results for input(s): "CKTOTAL", "CKMB", "CKMBINDEX", "TROPONINI" in the last 168 hours. BNP (last 3 results) No results for input(s): "PROBNP" in the last 8760 hours. HbA1C: No results for input(s): "HGBA1C" in the last 72 hours. CBG: No results for input(s): "GLUCAP" in the last 168 hours. Lipid Profile: No results for input(s): "CHOL", "HDL", "LDLCALC", "TRIG", "CHOLHDL", "LDLDIRECT" in the last  72 hours.  Thyroid Function Tests: No results for input(s): "TSH", "T4TOTAL", "FREET4", "T3FREE", "THYROIDAB" in the last 72 hours. Anemia Panel: Recent Labs    03/26/22 1825  VITAMINB12 321  FOLATE 2.0*  FERRITIN 470*  TIBC 255  IRON 227*  RETICCTPCT 2.4    Sepsis Labs: No results for input(s): "PROCALCITON", "LATICACIDVEN" in the last 168 hours.  Recent Results (from the past 240 hour(s))  Gastrointestinal Panel by PCR , Stool     Status: None   Collection Time: 03/26/22 11:20 PM   Specimen:  Stool  Result Value Ref Range Status   Campylobacter species NOT DETECTED NOT DETECTED Final   Plesimonas shigelloides NOT DETECTED NOT DETECTED Final   Salmonella species NOT DETECTED NOT DETECTED Final   Yersinia enterocolitica NOT DETECTED NOT DETECTED Final   Vibrio species NOT DETECTED NOT DETECTED Final   Vibrio cholerae NOT DETECTED NOT DETECTED Final   Enteroaggregative E coli (EAEC) NOT DETECTED NOT DETECTED Final   Enteropathogenic E coli (EPEC) NOT DETECTED NOT DETECTED Final   Enterotoxigenic E coli (ETEC) NOT DETECTED NOT DETECTED Final   Shiga like toxin producing E coli (STEC) NOT DETECTED NOT DETECTED Final   Shigella/Enteroinvasive E coli (EIEC) NOT DETECTED NOT DETECTED Final   Cryptosporidium NOT DETECTED NOT DETECTED Final   Cyclospora cayetanensis NOT DETECTED NOT DETECTED Final   Entamoeba histolytica NOT DETECTED NOT DETECTED Final   Giardia lamblia NOT DETECTED NOT DETECTED Final   Adenovirus F40/41 NOT DETECTED NOT DETECTED Final   Astrovirus NOT DETECTED NOT DETECTED Final   Norovirus GI/GII NOT DETECTED NOT DETECTED Final   Rotavirus A NOT DETECTED NOT DETECTED Final   Sapovirus (I, II, IV, and V) NOT DETECTED NOT DETECTED Final    Comment: Performed at [facility], [address] [place], [place] [address]  C Difficile Quick Screen w PCR reflex     Status: None   Collection Time: 03/27/22  7:12 AM   Specimen: STOOL  Result Value Ref Range  Status   C Diff antigen NEGATIVE NEGATIVE Final   C Diff toxin NEGATIVE NEGATIVE Final   C Diff interpretation No C. difficile detected.  Final    Comment: Performed at [facility], [address], [place], [place] [address]         Radiology Studies: No results found.      Scheduled Meds:  sodium chloride   Intravenous Once   sodium chloride   Intravenous Once   bictegravir-emtricitabine-tenofovir AF  1 tablet Oral Daily   carvedilol  12.5 mg Oral BID WC   vitamin B-12  1,000 mcg Oral Daily   feeding supplement  237 mL Oral BID BM   fluconazole  100 mg Oral Daily   folic acid  1 mg Oral Daily   multivitamin with minerals  1 tablet Oral Daily   pantoprazole  40 mg Oral Daily   sulfamethoxazole-trimethoprim  1 tablet Oral Daily   thiamine  100 mg Oral Daily   Or   thiamine  100 mg Intravenous Daily   Continuous Infusions:          Glade Lloyd, MD [facility] 03/29/2022, 8:03 AM

## 2022-03-30 DIAGNOSIS — B20 Human immunodeficiency virus [HIV] disease: Secondary | ICD-10-CM | POA: Diagnosis not present

## 2022-03-30 DIAGNOSIS — F1010 Alcohol abuse, uncomplicated: Secondary | ICD-10-CM | POA: Diagnosis not present

## 2022-03-30 DIAGNOSIS — E876 Hypokalemia: Secondary | ICD-10-CM | POA: Diagnosis not present

## 2022-03-30 DIAGNOSIS — D649 Anemia, unspecified: Secondary | ICD-10-CM | POA: Diagnosis not present

## 2022-03-30 LAB — CBC WITH DIFFERENTIAL/PLATELET
Abs Immature Granulocytes: 0.13 10*3/uL — ABNORMAL HIGH (ref 0.00–0.07)
Basophils Absolute: 0 10*3/uL (ref 0.0–0.1)
Basophils Relative: 0 %
Eosinophils Absolute: 0 10*3/uL (ref 0.0–0.5)
Eosinophils Relative: 0 %
HCT: 21.1 % — ABNORMAL LOW (ref 39.0–52.0)
Hemoglobin: 7.5 g/dL — ABNORMAL LOW (ref 13.0–17.0)
Immature Granulocytes: 2 %
Lymphocytes Relative: 33 %
Lymphs Abs: 1.9 10*3/uL (ref 0.7–4.0)
MCH: 29.6 pg (ref 26.0–34.0)
MCHC: 35.5 g/dL (ref 30.0–36.0)
MCV: 83.4 fL (ref 80.0–100.0)
Monocytes Absolute: 0.9 10*3/uL (ref 0.1–1.0)
Monocytes Relative: 16 %
Neutro Abs: 2.8 10*3/uL (ref 1.7–7.7)
Neutrophils Relative %: 49 %
Platelets: 159 10*3/uL (ref 150–400)
RBC: 2.53 MIL/uL — ABNORMAL LOW (ref 4.22–5.81)
RDW: 22.7 % — ABNORMAL HIGH (ref 11.5–15.5)
WBC: 5.7 10*3/uL (ref 4.0–10.5)
nRBC: 1.4 % — ABNORMAL HIGH (ref 0.0–0.2)

## 2022-03-30 LAB — MAGNESIUM: Magnesium: 1.3 mg/dL — ABNORMAL LOW (ref 1.7–2.4)

## 2022-03-30 LAB — COMPREHENSIVE METABOLIC PANEL
ALT: 23 U/L (ref 0–44)
AST: 51 U/L — ABNORMAL HIGH (ref 15–41)
Albumin: 2.1 g/dL — ABNORMAL LOW (ref 3.5–5.0)
Alkaline Phosphatase: 64 U/L (ref 38–126)
Anion gap: 8 (ref 5–15)
BUN: <5 mg/dL — ABNORMAL LOW (ref 6–20)
CO2: 29 mmol/L (ref 22–32)
Calcium: 8 mg/dL — ABNORMAL LOW (ref 8.9–10.3)
Chloride: 104 mmol/L (ref 98–111)
Creatinine, Ser: 0.92 mg/dL (ref 0.61–1.24)
GFR, Estimated: >60 mL/min (ref >60)
Glucose, Bld: 97 mg/dL (ref 70–99)
Potassium: 3.4 mmol/L — ABNORMAL LOW (ref 3.5–5.1)
Sodium: 141 mmol/L (ref 135–145)
Total Bilirubin: 0.9 mg/dL (ref 0.3–1.2)
Total Protein: 5.1 g/dL — ABNORMAL LOW (ref 6.5–8.1)

## 2022-03-30 LAB — O&P RESULT

## 2022-03-30 LAB — OVA + PARASITE EXAM

## 2022-03-30 MED ORDER — CYANOCOBALAMIN 1000 MCG PO TABS: 1000.0000 ug | ORAL_TABLET | Freq: Every day | ORAL | 0 refills | Status: DC

## 2022-03-30 MED ORDER — VITAMIN B-1 100 MG PO TABS: 100.0000 mg | ORAL_TABLET | Freq: Every day | ORAL | 0 refills | Status: DC

## 2022-03-30 MED ORDER — POTASSIUM CHLORIDE CRYS ER 20 MEQ PO TBCR: 20.0000 meq | EXTENDED_RELEASE_TABLET | Freq: Two times a day (BID) | ORAL | 0 refills | Status: DC

## 2022-03-30 MED ORDER — ADULT MULTIVITAMIN W/MINERALS CH: 1.0000 | ORAL_TABLET | Freq: Every day | ORAL | Status: AC

## 2022-03-30 MED ORDER — MAGNESIUM SULFATE 4 GM/100ML IV SOLN
4.0000 g | Freq: Once | INTRAVENOUS | Status: AC
  Administered 2022-03-30: 4 g via INTRAVENOUS
  Filled 2022-03-30: qty 100

## 2022-03-30 MED ORDER — LOPERAMIDE HCL 2 MG PO CAPS: 2.0000 mg | ORAL_CAPSULE | ORAL | 0 refills | Status: DC | PRN

## 2022-03-30 MED ORDER — MAGNESIUM OXIDE 400 MG PO CAPS: 400.0000 mg | ORAL_CAPSULE | Freq: Two times a day (BID) | ORAL | 0 refills | Status: DC

## 2022-03-30 MED ORDER — FOLIC ACID 1 MG PO TABS: 2.0000 mg | ORAL_TABLET | Freq: Every day | ORAL | 0 refills | Status: DC

## 2022-03-30 MED ORDER — LOPERAMIDE HCL 2 MG PO CAPS: 2.0000 mg | ORAL_CAPSULE | ORAL | Status: DC | PRN

## 2022-03-30 MED ORDER — AMLODIPINE BESYLATE 10 MG PO TABS
10.0000 mg | ORAL_TABLET | Freq: Every day | ORAL | Status: DC
  Administered 2022-03-30: 10 mg via ORAL
  Filled 2022-03-30: qty 1

## 2022-03-30 MED ORDER — POTASSIUM CHLORIDE CRYS ER 20 MEQ PO TBCR
40.0000 meq | EXTENDED_RELEASE_TABLET | Freq: Once | ORAL | Status: AC
  Administered 2022-03-30: 40 meq via ORAL
  Filled 2022-03-30: qty 2

## 2022-03-30 NOTE — Progress Notes (Signed)
Mobility Specialist Progress Note:   03/30/22 1055  Mobility  Activity Ambulated independently in hallway  Level of Assistance Modified independent, requires aide device or extra time  Assistive Device Other (Comment) (IV Pole)  Distance Ambulated (ft) 500 ft  Activity Response Tolerated well  Mobility Referral Yes  $Mobility charge 1 Mobility   Pt received in BR, agreeable to mobility session. No physical assistance required. Back in room with all needs met.   Nelta Numbers Acute Rehab Secure Chat or Office Phone: [phone]

## 2022-03-30 NOTE — Progress Notes (Signed)
Patient shows no acute distress or pain noted, work note provided with AVS education. Good understanding. Mother here to pick up the patient. Shelter lists provided just in case.

## 2022-03-30 NOTE — Discharge Instructions (Signed)
[place] (women) Services: provides shelter for women without dependents Phone: [phone] Fax: [phone] Address: [address] [place], [place] [address] Contact: Rexanne Mano, (Director) [employer] (men, women & children) Teacher, adult education Phone: [phone]  Address: [address], [place], [place] [address] Contact: Arlan Organ, Civil engineer, contracting)  Chief Operating Officer -[place]:  provides food pantry and hot meals   (7:30-8:30 am; 11-12:30 pm; 6-7 pm) Phone: [phone] Fax: [phone] Address: [place], [place] Contact: Doctor, general practice Agricultural consultant) ext [phone]   [employer] (Women and families only -  no single men allowed) First 14 days emergency shelter, then can request  long term shelter.After that - $25/week (individual male), $35/week (families) Phone: [phone]  [address] [place] [address] Mikael Spray (Manager), [phone]  [facility] Transitional housing (male/male) 18-23 [place], [place] [address] [phone] Family Services- domestic violence shelter [phone]   [facility] [employer] for families; must be a [place] resident [phone]  Fax:  [phone] Address:  [place], [place] [address] Contact: Carlynn Spry, or Rev. Dalbert Garnet (Director) - [phone] [place]: transitional housing Phone: [phone] Fax: [place], [place] [address] Jen Reid (Director); Tressia Miners Chief Strategy Officer)  [place]:  provides shelter for adult men and women Phone:  [phone]  Fax:  [phone] Address:  [address] [place], [place] [address] Housing Case Manager Gilberto Better [phone]), or Rev. Dalbert Garnet (Director) - [phone] [facility] Application fee and  monthly rent (varies for each individual) Services:  Residential program for ex-offenders Phone:  [phone]  Address:  [address] [place], [place] [address] Contact: Derrell Lolling ([phone])  [employer]  provides shelter, food, clothing, and other supportive services for homeless young adults in [place], (ages 18-23) Phone:  [phone]  Fax #:  [phone] Address:  [address], [place], [place] [address] Contact: Rev. Darlina Sicilian (Development worker, international aid), Rev. Chaya Jan (Program Director) [employer]   Provides services for the homeless, including filing disability; helps with housing and grocery assistance- disabled Veterans [phone] Fax: [phone]   [address] [place], [place] [address] Contact: Irena Reichmann (ext. 29)  Room at the [facility]  provides services to homeless, pregnant [place] residents of any age. Clients may have other children  Phone:  [phone] Fax: [phone] Address:  [address] [place] [address] Contact: Ashley Jacobs [facility]) [facility] [address] [place], [place]    [place] 18-21 Supportive Housing 18-24 Maternity Housing 16-21 [address] [place], [place] [address] [phone] Fax [phone]   Family Services- domestic violence shelter [phone]   [facility] [place]  Adult, emergency food assistance, [facility] for family shelter Phone: [phone] Address: [address], [place], [place] [address]  [place]  provides [place] approved housing assistance programs Phone: [phone] [place], [place], [place] [address]  [place] with housing, 2nd chance program for offenders, domestic violence program Phone: [place] Men in [place], rehab services also provided  Phone:  [phone] Address: [address] [place],  [address] [facility]  Services: Emergency day shelter only  Phone: [phone] Address: [address] [place], [place] [address]  Room at the [facility] [address] [place]  [phone]   [facility]   [employer] and shelter, teaches independent skills Phone:  [phone]  Fax:  [phone] Address:  [address] [place] [address] Kaylyn Layer (Exec Director), Isabell Jarvis (Asst Director) [facility] for the [facility] housing, teaches independent skills Phone:  [phone]  Fax:  [phone] [address] [place], [place] [address] Suella Broad (CM), Luisa Dago (Director)  [employer]:  Family shelter Phone:  [phone] Address:  [address] [place], [place] [address]   [facility]   Room at the [facility] [address] [place], [place] [address] [phone]   [facility]   Contact: Ezzard Flax [phone], ext [phone]) or Contact: Everlean Alstrom [phone], ext. [phone]) [facility] men Services:  housing for homeless/abused a substance in the last 30 days  [phone], ext. [phone] Fax [phone]  [address], [place], [place] [address] Contact: Leane Para, ext. 207 Scientist, research (medical))  [employer]- women housing for [employer] abused a substance in the last 30 days [phone], ext. [phone] Fax: [phone] Address: [address] [place], [place] [address] Contact: Mickle Mallory, ext 114 Scientist, research (medical)) [facility] Emergency shelter, income/employment and housing services Phone: [phone] Address: [address] [place], [place] [address]  Judeen Hammans Services: [place] [facility], basic services Phone: [phone] Address: [address] [place], [place] [address]   [facility] ([employer]) For veterans who are homeless or  on the verge (will make referrals) [phone] 1-877-4AID-VET  Fax: [phone] Address:  [address] [place], [place] [address] [facility]  Services: Overnight shelter Phone: [phone] Address: [address], [place], [place] [address]

## 2022-03-30 NOTE — Discharge Summary (Signed)
Physician Discharge Summary  Harold Flores QMG:[id number] DOB: 1990/12/15 DOA: 03/26/2022  PCP: Corwin Levins, MD  Admit date: 03/26/2022 Discharge date: 03/30/2022  Admitted From: Home Disposition: Home  Recommendations for Outpatient Follow-up:  Follow up with PCP in 1 week with repeat CBC/BMP/magnesium On present follow-up with ID Comply with medications and follow-up Follow up in ED if symptoms worsen or new appear   Home Health: No Equipment/Devices: None  Discharge Condition: Stable CODE STATUS: Full Diet recommendation: Heart healthy  Brief/Interim Summary: 31 y.o. male with medical history significant of HIV/AIDS with noncompliance with medications, alcohol abuse, syphilis, GBS presented with abnormal labs and weakness.  He was seen in the ID clinic a day prior to presentation and was restarted on Biktarvy, Bactrim and started on Diflucan as well.  On presentation, he was found to be severely hypokalemic with potassium of less than 2, hypomagnesemic with hemoglobin of 6.1.  He was transfused packed red cells and electrolyte supplementation was done.  During the hospitalization, he required few units packed red cells transfusion along with supplementation of potassium and magnesium.  His potassium and magnesium are low but improving and he is tolerating diet with stable hemoglobin.  He will be discharged home today on oral potassium and magnesium.  He needs to be compliant with his medications and follow-up.  Outpatient follow-up with PCP/ID.  Discharge Diagnoses:   Severe hypokalemia -Presented with potassium of less than 2.  Potassium 3.4 this morning.  Patient has received extensive IV and oral supplementation during the hospitalization.  Continue oral potassium supplementation on discharge.  Outpatient follow-up of potassium.    Hypomagnesemia -Replace prior to discharge.  Continue oral magnesium oxide supplementation on discharge.  Outpatient follow-up of magnesium.     Anemia -Possibly from anemia of chronic disease from HIV/AIDS -Status post 3 units packed red cell transfusion.  Hemoglobin 7.5 this morning.  Outpatient follow-up.   Leukopenia -Resolved   HIV/AIDS -Patient was recently restarted on Biktarvy and Bactrim.  CD4 count of 95.  Outpatient follow-up with ID   Oral thrush -Continue Diflucan which was started by ID as an outpatient recently   Folate deficiency -Folic acid level of 2.  Continue supplementation.   Positive RPR -History of treatment for syphilis in the past.  Outpatient follow-up with ID.   Alcohol abuse -No signs of withdrawal.  Treated with CIWA protocol along with thiamine, folic acid and multivitamin.  Continue thiamine, folic acid and multivitamin on discharge.  Hypertension -Patient needs to be compliant with his home regimen.  Blood pressure still elevated.  Resume amlodipine on discharge.  Continue Coreg.   Diarrhea -Chronic.  Stool for C. difficile negative.  GI PCR negative.  Use Imodium as needed.  DC'd isolation.  Outpatient follow-up.   Homelessness -TOC consulted.  Patient states that he has a place to stay after discharge  Discharge Instructions  Discharge Instructions     Ambulatory referral to Infectious Disease   Complete by: As directed    Diet - low sodium heart healthy   Complete by: As directed    Increase activity slowly   Complete by: As directed       Allergies as of 03/30/2022   No Known Allergies      Medication List     STOP taking these medications    potassium chloride 10 MEQ tablet Commonly known as: Klor-Con 10       TAKE these medications    amLODipine 10 MG tablet Commonly known as:  NORVASC TAKE 1 TABLET (10 MG TOTAL) BY MOUTH DAILY.   bictegravir-emtricitabine-tenofovir AF 50-200-25 MG Tabs tablet Commonly known as: BIKTARVY TAKE 1 TABLET BY MOUTH DAILY.   carvedilol 12.5 MG tablet Commonly known as: COREG TAKE 1 TABLET BY MOUTH 2 (TWO) TIMES DAILY.  MUST KEEP SCHEDULED APPT FOR FUTURE REFILLS What changed:  how much to take how to take this when to take this   cyanocobalamin 1000 MCG tablet Take 1 tablet (1,000 mcg total) by mouth daily.   fluconazole 100 MG tablet Commonly known as: DIFLUCAN Take 1 tablet (100 mg total) by mouth daily.   folic acid 1 MG tablet Commonly known as: FOLVITE Take 2 tablets (2 mg total) by mouth daily.   lansoprazole 30 MG capsule Commonly known as: PREVACID Take 1 capsule (30 mg total) by mouth daily.   loperamide 2 MG capsule Commonly known as: IMODIUM Take 1 capsule (2 mg total) by mouth as needed for diarrhea or loose stools.   Magnesium Oxide 400 MG Caps Take 1 capsule (400 mg total) by mouth 2 (two) times daily.   multivitamin with minerals Tabs tablet Take 1 tablet by mouth daily.   potassium chloride SA 20 MEQ tablet Commonly known as: KLOR-CON M Take 1 tablet (20 mEq total) by mouth 2 (two) times daily.   sulfamethoxazole-trimethoprim 800-160 MG tablet Commonly known as: BACTRIM DS TAKE 1 TABLET BY MOUTH DAILY.   thiamine 100 MG tablet Commonly known as: Vitamin B-1 Take 1 tablet (100 mg total) by mouth daily.        Follow-up Information     Corwin Levins, MD. Schedule an appointment as soon as possible for a visit in 1 week(s).   Specialties: Internal Medicine, Radiology Why: with repeat cbc/bmp/magnesium Contact information: [address] [place] [address] [phone]                No Known Allergies  Consultations: None   Procedures/Studies: DG Chest Portable 1 View  Result Date: 03/26/2022 CLINICAL DATA:  Lethargy.  HIV positive. EXAM: PORTABLE CHEST 1 VIEW COMPARISON:  Chest x-ray 08/03/2020 FINDINGS: The heart size and mediastinal contours are within normal limits. Both lungs are clear. The visualized skeletal structures are unremarkable. IMPRESSION: No active disease. Electronically Signed   By: Darliss Cheney M.D.   On: 03/26/2022  18:13      Subjective: Patient seen and examined at bedside.  Denies worsening weakness, nausea, vomiting or shortness of breath.  Feels okay to go home today. Discharge Exam: Vitals:   03/29/22 2014 03/30/22 0558  BP: (!) 143/106 (!) 145/103  Pulse: 88 91  Resp: 17 17  Temp: 98.6 F (37 C) 98 F (36.7 C)  SpO2: 100% 99%    General: Pt is alert, awake, not in acute distress.  Currently on room air. Cardiovascular: rate controlled, S1/S2 + Respiratory: bilateral decreased breath sounds at bases Abdominal: Soft, NT, ND, bowel sounds + Extremities: no edema, no cyanosis    The results of significant diagnostics from this hospitalization (including imaging, microbiology, ancillary and laboratory) are listed below for reference.     Microbiology: Recent Results (from the past 240 hour(s))  Gastrointestinal Panel by PCR , Stool     Status: None   Collection Time: 03/26/22 11:20 PM   Specimen: Stool  Result Value Ref Range Status   Campylobacter species NOT DETECTED NOT DETECTED Final   Plesimonas shigelloides NOT DETECTED NOT DETECTED Final   Salmonella species NOT DETECTED NOT DETECTED Final  Yersinia enterocolitica NOT DETECTED NOT DETECTED Final   Vibrio species NOT DETECTED NOT DETECTED Final   Vibrio cholerae NOT DETECTED NOT DETECTED Final   Enteroaggregative E coli (EAEC) NOT DETECTED NOT DETECTED Final   Enteropathogenic E coli (EPEC) NOT DETECTED NOT DETECTED Final   Enterotoxigenic E coli (ETEC) NOT DETECTED NOT DETECTED Final   Shiga like toxin producing E coli (STEC) NOT DETECTED NOT DETECTED Final   Shigella/Enteroinvasive E coli (EIEC) NOT DETECTED NOT DETECTED Final   Cryptosporidium NOT DETECTED NOT DETECTED Final   Cyclospora cayetanensis NOT DETECTED NOT DETECTED Final   Entamoeba histolytica NOT DETECTED NOT DETECTED Final   Giardia lamblia NOT DETECTED NOT DETECTED Final   Adenovirus F40/41 NOT DETECTED NOT DETECTED Final   Astrovirus NOT DETECTED NOT  DETECTED Final   Norovirus GI/GII NOT DETECTED NOT DETECTED Final   Rotavirus A NOT DETECTED NOT DETECTED Final   Sapovirus (I, II, IV, and V) NOT DETECTED NOT DETECTED Final    Comment: Performed at [facility], [address] [place], [place] [address]  C Difficile Quick Screen w PCR reflex     Status: None   Collection Time: 03/27/22  7:12 AM   Specimen: STOOL  Result Value Ref Range Status   C Diff antigen NEGATIVE NEGATIVE Final   C Diff toxin NEGATIVE NEGATIVE Final   C Diff interpretation No C. difficile detected.  Final    Comment: Performed at [facility], [place] [address] [place], [place] [address]     Labs: BNP (last 3 results) No results for input(s): "BNP" in the last 8760 hours. Basic Metabolic Panel: Recent Labs  Lab 03/26/22 1633 03/27/22 0604 03/27/22 1840 03/28/22 0348 03/28/22 2226 03/29/22 0405 03/29/22 1801 03/30/22 0405  NA 136 138  --  137  --  141  --  141  K <2.0* <2.0* 2.1* 2.0* 2.8* 2.6* 3.4* 3.4*  CL 81* 86*  --  93*  --  100  --  104  CO2 39* 38*  --  36*  --  32  --  29  GLUCOSE 107* 90  --  95  --  103*  --  97  BUN <5* <5*  --  <5*  --  <5*  --  <5*  CREATININE 0.97 0.86  --  0.92  --  0.93  --  0.92  CALCIUM 7.6* 7.6*  --  7.6*  --  7.8*  --  8.0*  MG 1.0* 1.9 1.9 2.2  --  1.4* 1.6* 1.3*  PHOS 4.1  --   --   --   --   --   --   --    Liver Function Tests: Recent Labs  Lab 03/25/22 1501 03/26/22 1633 03/27/22 0604 03/28/22 0348 03/30/22 0405  AST 55* 56* 50* 48* 51*  ALT 18 21 19 18 23   ALKPHOS  --  71 62 59 64  BILITOT 1.6* 1.7* 1.7* 1.3* 0.9  PROT 6.4 6.4* 5.6* 5.1* 5.1*  ALBUMIN  --  2.6* 2.3* 2.1* 2.1*   No results for input(s): "LIPASE", "AMYLASE" in the last 168 hours. No results for input(s): "AMMONIA" in the last 168 hours. CBC: Recent Labs  Lab 03/25/22 1501 03/26/22 1633 03/27/22 0604 03/27/22 1840 03/28/22 0348 03/28/22 2226 03/29/22 0405 03/30/22 0405  WBC 2.7* 3.1*   < > 3.5* 3.1* 4.5  4.2 5.7  NEUTROABS 1,782 2.1  --   --  2.3  --  2.2 2.8  HGB  6.4* 6.1*   < > 7.6* 6.9* 8.0* 7.4* 7.5*  HCT 18.4* 18.1*   < > 21.3* 19.0* 22.5* 20.6* 21.1*  MCV 84.8 86.2   < > 81.0 79.5* 83.0 81.7 83.4  PLT 237 224   < > 165 160 155 150 159   < > = values in this interval not displayed.   Cardiac Enzymes: No results for input(s): "CKTOTAL", "CKMB", "CKMBINDEX", "TROPONINI" in the last 168 hours. BNP: Invalid input(s): "POCBNP" CBG: No results for input(s): "GLUCAP" in the last 168 hours. D-Dimer No results for input(s): "DDIMER" in the last 72 hours. Hgb A1c No results for input(s): "HGBA1C" in the last 72 hours. Lipid Profile No results for input(s): "CHOL", "HDL", "LDLCALC", "TRIG", "CHOLHDL", "LDLDIRECT" in the last 72 hours. Thyroid function studies No results for input(s): "TSH", "T4TOTAL", "T3FREE", "THYROIDAB" in the last 72 hours.  Invalid input(s): "FREET3" Anemia work up No results for input(s): "VITAMINB12", "FOLATE", "FERRITIN", "TIBC", "IRON", "RETICCTPCT" in the last 72 hours. Urinalysis    Component Value Date/Time   COLORURINE Dark Yellow (A) 11/26/2021 1040   APPEARANCEUR CLEAR 11/26/2021 1040   LABSPEC 1.010 11/26/2021 1040   PHURINE 7.0 11/26/2021 1040   GLUCOSEU NEGATIVE 11/26/2021 1040   HGBUR NEGATIVE 11/26/2021 1040   BILIRUBINUR MODERATE (A) 11/26/2021 1040   KETONESUR TRACE (A) 11/26/2021 1040   PROTEINUR 30 (A) 08/09/2020 0333   UROBILINOGEN 2.0 (A) 11/26/2021 1040   NITRITE NEGATIVE 11/26/2021 1040   LEUKOCYTESUR NEGATIVE 11/26/2021 1040   Sepsis Labs Recent Labs  Lab 03/28/22 0348 03/28/22 2226 03/29/22 0405 03/30/22 0405  WBC 3.1* 4.5 4.2 5.7   Microbiology Recent Results (from the past 240 hour(s))  Gastrointestinal Panel by PCR , Stool     Status: None   Collection Time: 03/26/22 11:20 PM   Specimen: Stool  Result Value Ref Range Status   Campylobacter species NOT DETECTED NOT DETECTED Final   Plesimonas shigelloides NOT  DETECTED NOT DETECTED Final   Salmonella species NOT DETECTED NOT DETECTED Final   Yersinia enterocolitica NOT DETECTED NOT DETECTED Final   Vibrio species NOT DETECTED NOT DETECTED Final   Vibrio cholerae NOT DETECTED NOT DETECTED Final   Enteroaggregative E coli (EAEC) NOT DETECTED NOT DETECTED Final   Enteropathogenic E coli (EPEC) NOT DETECTED NOT DETECTED Final   Enterotoxigenic E coli (ETEC) NOT DETECTED NOT DETECTED Final   Shiga like toxin producing E coli (STEC) NOT DETECTED NOT DETECTED Final   Shigella/Enteroinvasive E coli (EIEC) NOT DETECTED NOT DETECTED Final   Cryptosporidium NOT DETECTED NOT DETECTED Final   Cyclospora cayetanensis NOT DETECTED NOT DETECTED Final   Entamoeba histolytica NOT DETECTED NOT DETECTED Final   Giardia lamblia NOT DETECTED NOT DETECTED Final   Adenovirus F40/41 NOT DETECTED NOT DETECTED Final   Astrovirus NOT DETECTED NOT DETECTED Final   Norovirus GI/GII NOT DETECTED NOT DETECTED Final   Rotavirus A NOT DETECTED NOT DETECTED Final   Sapovirus (I, II, IV, and V) NOT DETECTED NOT DETECTED Final    Comment: Performed at [facility], [address] [place], [place] [id number]  C Difficile Quick Screen w PCR reflex     Status: None   Collection Time: 03/27/22  7:12 AM   Specimen: STOOL  Result Value Ref Range Status   C Diff antigen NEGATIVE NEGATIVE Final   C Diff toxin NEGATIVE NEGATIVE Final   C Diff interpretation No C. difficile detected.  Final    Comment: Performed at [facility], [address] [place], [place] [address]     Time coordinating discharge: 35 minutes  SIGNED:   Glade Lloyd, MD  [facility] 03/30/2022, 9:50 AM

## 2022-04-01 ENCOUNTER — Telehealth: Payer: Self-pay

## 2022-04-01 NOTE — Telephone Encounter (Signed)
Transition Care Management Unsuccessful Follow-up Telephone Call  Date of discharge and from where:  TCM d/c [facility] 03/30/2022 dx: hypokalemia  Attempts:  1st Attempt  Reason for unsuccessful TCM follow-up call:  Missing or invalid number

## 2022-04-02 NOTE — Telephone Encounter (Signed)
Transition Care Management Unsuccessful Follow-up Telephone Call  Date of discharge and from where:  [facility] 03/30/2022  Attempts:  2nd Attempt  Reason for unsuccessful TCM follow-up call:  Left voice message with patient's mother Juanda Crumble, [place] Direct Dial [phone]

## 2022-04-03 ENCOUNTER — Other Ambulatory Visit: Payer: Self-pay

## 2022-04-03 ENCOUNTER — Other Ambulatory Visit (HOSPITAL_COMMUNITY): Payer: Self-pay

## 2022-04-03 ENCOUNTER — Ambulatory Visit: Payer: PRIVATE HEALTH INSURANCE | Admitting: Infectious Disease

## 2022-04-03 DIAGNOSIS — B20 Human immunodeficiency virus [HIV] disease: Secondary | ICD-10-CM

## 2022-04-03 LAB — HIV RNA, RTPCR W/R GT (RTI, PI,INT)
HIV 1 RNA Quant: 104000 copies/mL — ABNORMAL HIGH
HIV-1 RNA Quant, Log: 5.02 Log copies/mL — ABNORMAL HIGH

## 2022-04-03 LAB — HIV-1 INTEGRASE GENOTYPE

## 2022-04-03 LAB — HIV-1 GENOTYPE: HIV-1 Genotype: DETECTED — AB

## 2022-04-03 MED ORDER — SULFAMETHOXAZOLE-TRIMETHOPRIM 800-160 MG PO TABS: 1.0000 | ORAL_TABLET | Freq: Every day | ORAL | 11 refills | Status: DC

## 2022-04-03 MED ORDER — BICTEGRAVIR-EMTRICITAB-TENOFOV 50-200-25 MG PO TABS: 1.0000 | ORAL_TABLET | Freq: Every day | ORAL | 11 refills | Status: DC

## 2022-04-03 NOTE — Progress Notes (Signed)
Received call from patient requesting that his Bactrim and Biktarvy prescriptions be sent to the [facility]. Prescriptions reordered per request. Called and spoke with pharmacist Cyril Mourning at [facility] and relayed that Bactrim and Biktarvy were discontinued due to patient needing a different pharmacy. Kristen verbalized understanding.  Binnie Kand, RN

## 2022-04-03 NOTE — Telephone Encounter (Signed)
Transition Care Management Unsuccessful Follow-up Telephone Call  Date of discharge and from where:  [facility] 03/30/2022  Attempts:  3rd Attempt  Reason for unsuccessful TCM follow-up call:  Missing or invalid number Juanda Crumble, [place] Direct Dial [phone]

## 2022-04-04 ENCOUNTER — Ambulatory Visit (INDEPENDENT_AMBULATORY_CARE_PROVIDER_SITE_OTHER): Payer: PRIVATE HEALTH INSURANCE

## 2022-04-04 ENCOUNTER — Other Ambulatory Visit: Payer: Self-pay

## 2022-04-04 ENCOUNTER — Encounter: Payer: Self-pay | Admitting: Infectious Disease

## 2022-04-04 ENCOUNTER — Ambulatory Visit (INDEPENDENT_AMBULATORY_CARE_PROVIDER_SITE_OTHER): Payer: PRIVATE HEALTH INSURANCE | Admitting: Infectious Disease

## 2022-04-04 VITALS — BP 171/122 | HR 76 | Temp 97.9°F | Ht 70.0 in | Wt 187.0 lb

## 2022-04-04 DIAGNOSIS — D638 Anemia in other chronic diseases classified elsewhere: Secondary | ICD-10-CM

## 2022-04-04 DIAGNOSIS — F1010 Alcohol abuse, uncomplicated: Secondary | ICD-10-CM

## 2022-04-04 DIAGNOSIS — B370 Candidal stomatitis: Secondary | ICD-10-CM | POA: Diagnosis not present

## 2022-04-04 DIAGNOSIS — B20 Human immunodeficiency virus [HIV] disease: Secondary | ICD-10-CM

## 2022-04-04 DIAGNOSIS — Z7185 Encounter for immunization safety counseling: Secondary | ICD-10-CM

## 2022-04-04 DIAGNOSIS — Z23 Encounter for immunization: Secondary | ICD-10-CM

## 2022-04-04 DIAGNOSIS — E876 Hypokalemia: Secondary | ICD-10-CM

## 2022-04-04 MED ORDER — AMLODIPINE BESYLATE 10 MG PO TABS: 10.0000 mg | ORAL_TABLET | Freq: Every day | ORAL | 3 refills | Status: DC

## 2022-04-04 NOTE — Progress Notes (Signed)
Subjective:  Chief complaint: followup for HIV, AIDS, alcoholism severe hypokalemia   Patient ID: Harold Flores, male    DOB: 1991/05/05, 31 y.o.   MRN: [id number]  HPI  Harold Flores is 31 year old Black man with HIV/AIDS, hx of Guillain Barre syndrome, alcoholism whom I saw  on 03/25/2022 in context of recurrent heavy drinking, not eating, not taking his medications. His mother had brought him in and was very concerned that his labs would be off. Indeed where he was severely hypokalemic and anemic.  I had to have him come back to the emergency department for admission where he had his potassium and magnesium repleted received blood transfusions.  He has been started back on his Biktarvy as well as Bactrim for PCP prophylaxis and his antihypertensives.  He is missing his amlodipine however and was hypertensive in the clinic today.  He claims he has been sober since prior to his admission.   He feels confident he will succeed in his sobriety though he is not in a formal support program. He is living with his mother and grandmother.    Past Medical History:  Diagnosis Date   Anal fissure    Anal fistula    Anal ulcer    Asthma    Epistaxis    GAD (generalized anxiety disorder)    GERD (gastroesophageal reflux disease)    Hemorrhoid    HIV (human immunodeficiency virus infection) ([place])    Hypertension    Syphilis    Thrush 03/25/2022   Varicella     Past Surgical History:  Procedure Laterality Date   EYE SURGERY  1994   blephroplasty right eye   RADIOLOGY WITH ANESTHESIA N/A 08/08/2020   Procedure: MRI WITH ANESTHESIA- BRAIN WITH AND WITHOUT CONTRAST,  CERVICAL SPINE WITH AND WITHOUT CONTRAST, LUMBAR SPINE WITH WITHOUT CONTRAST, THORACIC SPINE WITH WITHOUT CONTRAST;  Surgeon: Radiologist, Medication, MD;  Location: [facility];  Service: Radiology;  Laterality: N/A;    Family History  Problem Relation Age of Onset   Diabetes Mother    Hyperlipidemia Mother    Diabetes Maternal  Grandmother    Heart disease Paternal Grandfather    Cancer Neg Hx       Social History   Socioeconomic History   Marital status: Single    Spouse name: Not on file   Number of children: 0   Years of education: 13   Highest education level: Not on file  Occupational History   Occupation: [employer]  Tobacco Use   Smoking status: Every Day    Packs/day: 0.50    Years: 5.00    Total pack years: 2.50    Types: E-cigarettes, Cigarettes   Smokeless tobacco: Never  Substance and Sexual Activity   Alcohol use: Yes    Alcohol/week: 7.0 standard drinks of alcohol    Types: 7 Cans of beer per week    Comment: sober for 9 days as of 04/04/22   Drug use: Yes    Types: Marijuana    Comment: occ   Sexual activity: Yes    Partners: Male    Birth control/protection: Condom    Comment: [place]  Other Topics Concern   Not on file  Social History Narrative   Lives at home   A student   architectural games on computer   Fun: Music   Denies religious beliefs that would effect health care.    Social Determinants of Health   Financial Resource Strain: Not on file  Food  Insecurity: Not on file  Transportation Needs: Not on file  Physical Activity: Not on file  Stress: Not on file  Social Connections: Not on file    No Known Allergies   Current Outpatient Medications:    bictegravir-emtricitabine-tenofovir AF (BIKTARVY) 50-200-25 MG TABS tablet, TAKE 1 TABLET BY MOUTH DAILY., Disp: 30 tablet, Rfl: 11   carvedilol (COREG) 12.5 MG tablet, TAKE 1 TABLET BY MOUTH 2 (TWO) TIMES DAILY. MUST KEEP SCHEDULED APPT FOR FUTURE REFILLS (Patient taking differently: Take 12.5 mg by mouth daily. TAKE 1 TABLET BY MOUTH 2 (TWO) TIMES DAILY. MUST KEEP SCHEDULED APPT FOR FUTURE REFILLS), Disp: 180 tablet, Rfl: 3   cyanocobalamin 1000 MCG tablet, Take 1 tablet (1,000 mcg total) by mouth daily., Disp: 30 tablet, Rfl: 0   folic acid (FOLVITE) 1 MG tablet, Take 2 tablets (2 mg total) by mouth  daily., Disp: 60 tablet, Rfl: 0   lansoprazole (PREVACID) 30 MG capsule, Take 1 capsule (30 mg total) by mouth daily., Disp: 90 capsule, Rfl: 3   loperamide (IMODIUM) 2 MG capsule, Take 1 capsule (2 mg total) by mouth as needed for diarrhea or loose stools., Disp: 30 capsule, Rfl: 0   Magnesium Oxide 400 MG CAPS, Take 1 capsule (400 mg total) by mouth 2 (two) times daily., Disp: 30 capsule, Rfl: 0   Multiple Vitamin (MULTIVITAMIN WITH MINERALS) TABS tablet, Take 1 tablet by mouth daily., Disp: , Rfl:    potassium chloride SA (KLOR-CON M) 20 MEQ tablet, Take 1 tablet (20 mEq total) by mouth 2 (two) times daily., Disp: 30 tablet, Rfl: 0   sulfamethoxazole-trimethoprim (BACTRIM DS) 800-160 MG tablet, TAKE 1 TABLET BY MOUTH DAILY., Disp: 30 tablet, Rfl: 11   thiamine (VITAMIN B-1) 100 MG tablet, Take 1 tablet (100 mg total) by mouth daily., Disp: 30 tablet, Rfl: 0   amLODipine (NORVASC) 10 MG tablet, TAKE 1 TABLET (10 MG TOTAL) BY MOUTH DAILY., Disp: 90 tablet, Rfl: 3   fluconazole (DIFLUCAN) 100 MG tablet, Take 1 tablet (100 mg total) by mouth daily. (Patient not taking: Reported on 03/26/2022), Disp: 14 tablet, Rfl: 1   Review of Systems  Constitutional:  Negative for activity change, appetite change, chills, diaphoresis, fatigue, fever and unexpected weight change.  HENT:  Negative for congestion, rhinorrhea, sinus pressure, sneezing, sore throat and trouble swallowing.   Eyes:  Negative for photophobia and visual disturbance.  Respiratory:  Negative for cough, chest tightness, shortness of breath, wheezing and stridor.   Cardiovascular:  Negative for chest pain, palpitations and leg swelling.  Gastrointestinal:  Negative for abdominal distention, abdominal pain, anal bleeding, blood in stool, constipation, diarrhea, nausea and vomiting.  Genitourinary:  Negative for difficulty urinating, dysuria, flank pain and hematuria.  Musculoskeletal:  Negative for arthralgias, back pain, gait problem, joint  swelling and myalgias.  Skin:  Negative for color change, pallor, rash and wound.  Neurological:  Negative for dizziness, tremors, weakness and light-headedness.  Hematological:  Negative for adenopathy. Does not bruise/bleed easily.  Psychiatric/Behavioral:  Negative for agitation, behavioral problems, confusion, decreased concentration, dysphoric mood and sleep disturbance.        Objective:   Physical Exam Constitutional:      Appearance: He is well-developed.  HENT:     Head: Normocephalic and atraumatic.  Eyes:     Conjunctiva/sclera: Conjunctivae normal.  Cardiovascular:     Rate and Rhythm: Normal rate and regular rhythm.  Pulmonary:     Effort: Pulmonary effort is normal. No respiratory distress.  Breath sounds: No wheezing.  Abdominal:     General: There is no distension.     Palpations: Abdomen is soft.  Musculoskeletal:        General: No tenderness. Normal range of motion.     Cervical back: Normal range of motion and neck supple.  Skin:    General: Skin is warm and dry.     Coloration: Skin is not pale.     Findings: No erythema or rash.  Neurological:     General: No focal deficit present.     Mental Status: He is alert and oriented to person, place, and time.  Psychiatric:        Mood and Affect: Mood normal.        Behavior: Behavior normal.        Thought Content: Thought content normal.        Judgment: Judgment normal.           Assessment & Plan:   Depression and alcoholism:  I have given him resources for mental health, substance abuse in our community and at our clinic  He still has the paperwork and certainly will reach out.  In the interim I will schedule him for follow-up in a month time with me.  HIV AIDS  I will add order HIV viral load w reflex to genotype,  CD4 count CBC with differential CMP, RPR GC and chlamydia and I will continue  [employer] and Bactrim  Hypokalemia: Resolved we will recheck CMP  today.  Severe anemia likely anemia of chronic disease: Recheck CBC today  Vaccine counseling: recommended COVID 19 and flu shot.

## 2022-04-05 LAB — T-HELPER CELLS (CD4) COUNT (NOT AT ARMC)
CD4 % Helper T Cell: 19 % — ABNORMAL LOW (ref 33–65)
CD4 T Cell Abs: 226 /uL — ABNORMAL LOW (ref 400–1790)

## 2022-04-09 ENCOUNTER — Telehealth: Payer: Self-pay

## 2022-04-09 LAB — CBC WITH DIFFERENTIAL/PLATELET
Absolute Monocytes: 756 cells/uL (ref 200–950)
Basophils Absolute: 7 cells/uL (ref 0–200)
Basophils Relative: 0.1 %
Eosinophils Absolute: 108 cells/uL (ref 15–500)
Eosinophils Relative: 1.5 %
HCT: 27.6 % — ABNORMAL LOW (ref 38.5–50.0)
Hemoglobin: 8.8 g/dL — ABNORMAL LOW (ref 13.2–17.1)
Lymphs Abs: 1260 cells/uL (ref 850–3900)
MCH: 29.1 pg (ref 27.0–33.0)
MCHC: 31.9 g/dL — ABNORMAL LOW (ref 32.0–36.0)
MCV: 91.4 fL (ref 80.0–100.0)
MPV: 11 fL (ref 7.5–12.5)
Monocytes Relative: 10.5 %
Neutro Abs: 5069 cells/uL (ref 1500–7800)
Neutrophils Relative %: 70.4 %
Platelets: 545 10*3/uL — ABNORMAL HIGH (ref 140–400)
RBC: 3.02 10*6/uL — ABNORMAL LOW (ref 4.20–5.80)
RDW: 21.8 % — ABNORMAL HIGH (ref 11.0–15.0)
Total Lymphocyte: 17.5 %
WBC: 7.2 10*3/uL (ref 3.8–10.8)

## 2022-04-09 LAB — COMPLETE METABOLIC PANEL WITH GFR
AG Ratio: 0.9 (calc) — ABNORMAL LOW (ref 1.0–2.5)
ALT: 21 U/L (ref 9–46)
AST: 31 U/L (ref 10–40)
Albumin: 3.1 g/dL — ABNORMAL LOW (ref 3.6–5.1)
Alkaline phosphatase (APISO): 86 U/L (ref 36–130)
BUN: 7 mg/dL (ref 7–25)
CO2: 28 mmol/L (ref 20–32)
Calcium: 8.3 mg/dL — ABNORMAL LOW (ref 8.6–10.3)
Chloride: 104 mmol/L (ref 98–110)
Creat: 0.85 mg/dL (ref 0.60–1.26)
Globulin: 3.5 g/dL (calc) (ref 1.9–3.7)
Glucose, Bld: 80 mg/dL (ref 65–99)
Potassium: 4.8 mmol/L (ref 3.5–5.3)
Sodium: 140 mmol/L (ref 135–146)
Total Bilirubin: 0.9 mg/dL (ref 0.2–1.2)
Total Protein: 6.6 g/dL (ref 6.1–8.1)
eGFR: 120 mL/min/{1.73_m2} (ref >=60)

## 2022-04-09 LAB — CBC MORPHOLOGY

## 2022-04-09 LAB — HIV RNA, RTPCR W/R GT (RTI, PI,INT)
HIV 1 RNA Quant: 164 copies/mL — ABNORMAL HIGH
HIV-1 RNA Quant, Log: 2.21 Log copies/mL — ABNORMAL HIGH

## 2022-04-09 NOTE — Telephone Encounter (Signed)
-----   Message from Truman Hayward, MD sent at 04/09/2022  9:52 AM EDT ----- Want to congratulate Dorothea Ogle on getting his HIV under better control and his sobriety ----- Message ----- From: Cheyenne Adas Lab Results In Sent: 04/04/2022   5:10 PM EDT To: Truman Hayward, MD

## 2022-04-19 ENCOUNTER — Telehealth: Payer: Self-pay

## 2022-04-19 NOTE — Telephone Encounter (Signed)
Patient is requesting a letter to support that he would benefit from an emotional support animal (Cat) due to his condition. [phone]

## 2022-04-24 ENCOUNTER — Encounter: Payer: Self-pay | Admitting: Infectious Disease

## 2022-05-06 ENCOUNTER — Ambulatory Visit: Payer: PRIVATE HEALTH INSURANCE | Admitting: Infectious Disease

## 2022-05-06 NOTE — Progress Notes (Deleted)
Subjective:  Chief complaint: Follow-up for HIV AIDS alcoholism and prior severe hypokalemia Patient ID: Harold Flores, male    DOB: March 13, 1991, 31 y.o.   MRN: [id number]  HPI  Harold Flores is 31 year old Black man with HIV/AIDS, hx of Guillain Barre syndrome, alcoholism whom I saw  on 03/25/2022 in context of recurrent heavy drinking, not eating, not taking his medications. His mother had brought him in and was very concerned that his labs would be off. Indeed where he was severely hypokalemic and anemic.  I had to have him come back to the emergency department for admission where he had his potassium and magnesium repleted received blood transfusions.  He has been started back on his Biktarvy as well as Bactrim for PCP prophylaxis and his antihypertensives.  He was  missing his amlodipine however and was hypertensive in the clinic last visit.  He claims he has been sober since prior to his admission.   He feels confident he will succeed in his sobriety though he is not in a formal support program. He was  living with his mother and grandmother.    Past Medical History:  Diagnosis Date   Anal fissure    Anal fistula    Anal ulcer    Asthma    Epistaxis    GAD (generalized anxiety disorder)    GERD (gastroesophageal reflux disease)    Hemorrhoid    HIV (human immunodeficiency virus infection) (HCC)    Hypertension    Syphilis    Thrush 03/25/2022   Varicella     Past Surgical History:  Procedure Laterality Date   EYE SURGERY  1994   blephroplasty right eye   RADIOLOGY WITH ANESTHESIA N/A 08/08/2020   Procedure: MRI WITH ANESTHESIA- BRAIN WITH AND WITHOUT CONTRAST,  CERVICAL SPINE WITH AND WITHOUT CONTRAST, LUMBAR SPINE WITH WITHOUT CONTRAST, THORACIC SPINE WITH WITHOUT CONTRAST;  Surgeon: Radiologist, Medication, MD;  Location: [facility] OR;  Service: Radiology;  Laterality: N/A;    Family History  Problem Relation Age of Onset   Diabetes Mother    Hyperlipidemia Mother    Diabetes  Maternal Grandmother    Heart disease Paternal Grandfather    Cancer Neg Hx       Social History   Socioeconomic History   Marital status: Single    Spouse name: Not on file   Number of children: 0   Years of education: 13   Highest education level: Not on file  Occupational History   Occupation: [employer]  Tobacco Use   Smoking status: Every Day    Packs/day: 0.50    Years: 5.00    Total pack years: 2.50    Types: E-cigarettes, Cigarettes   Smokeless tobacco: Never  Substance and Sexual Activity   Alcohol use: Yes    Alcohol/week: 7.0 standard drinks of alcohol    Types: 7 Cans of beer per week    Comment: sober for 9 days as of 04/04/22   Drug use: Yes    Types: Marijuana    Comment: occ   Sexual activity: Yes    Partners: Male    Birth control/protection: Condom    Comment: DECLINED CONDOMS  Other Topics Concern   Not on file  Social History Narrative   Lives at home   A student   architectural games on computer   Fun: Music   Denies religious beliefs that would effect health care.    Social Determinants of Health   Financial Resource Strain: Not on  file  Food Insecurity: Not on file  Transportation Needs: Not on file  Physical Activity: Not on file  Stress: Not on file  Social Connections: Not on file    No Known Allergies   Current Outpatient Medications:    amLODipine (NORVASC) 10 MG tablet, TAKE 1 TABLET (10 MG TOTAL) BY MOUTH DAILY., Disp: 90 tablet, Rfl: 3   bictegravir-emtricitabine-tenofovir AF (BIKTARVY) 50-200-25 MG TABS tablet, TAKE 1 TABLET BY MOUTH DAILY., Disp: 30 tablet, Rfl: 11   carvedilol (COREG) 12.5 MG tablet, TAKE 1 TABLET BY MOUTH 2 (TWO) TIMES DAILY. MUST KEEP SCHEDULED APPT FOR FUTURE REFILLS (Patient taking differently: Take 12.5 mg by mouth daily. TAKE 1 TABLET BY MOUTH 2 (TWO) TIMES DAILY. MUST KEEP SCHEDULED APPT FOR FUTURE REFILLS), Disp: 180 tablet, Rfl: 3   cyanocobalamin 1000 MCG tablet, Take 1 tablet (1,000 mcg total) by  mouth daily., Disp: 30 tablet, Rfl: 0   fluconazole (DIFLUCAN) 100 MG tablet, Take 1 tablet (100 mg total) by mouth daily. (Patient not taking: Reported on 03/26/2022), Disp: 14 tablet, Rfl: 1   folic acid (FOLVITE) 1 MG tablet, Take 2 tablets (2 mg total) by mouth daily., Disp: 60 tablet, Rfl: 0   lansoprazole (PREVACID) 30 MG capsule, Take 1 capsule (30 mg total) by mouth daily., Disp: 90 capsule, Rfl: 3   loperamide (IMODIUM) 2 MG capsule, Take 1 capsule (2 mg total) by mouth as needed for diarrhea or loose stools., Disp: 30 capsule, Rfl: 0   Magnesium Oxide 400 MG CAPS, Take 1 capsule (400 mg total) by mouth 2 (two) times daily., Disp: 30 capsule, Rfl: 0   Multiple Vitamin (MULTIVITAMIN WITH MINERALS) TABS tablet, Take 1 tablet by mouth daily., Disp: , Rfl:    potassium chloride SA (KLOR-CON M) 20 MEQ tablet, Take 1 tablet (20 mEq total) by mouth 2 (two) times daily., Disp: 30 tablet, Rfl: 0   sulfamethoxazole-trimethoprim (BACTRIM DS) 800-160 MG tablet, TAKE 1 TABLET BY MOUTH DAILY., Disp: 30 tablet, Rfl: 11   thiamine (VITAMIN B-1) 100 MG tablet, Take 1 tablet (100 mg total) by mouth daily., Disp: 30 tablet, Rfl: 0   Review of Systems  Constitutional:  Negative for activity change, appetite change, chills, diaphoresis, fatigue, fever and unexpected weight change.  HENT:  Negative for congestion, rhinorrhea, sinus pressure, sneezing, sore throat and trouble swallowing.   Eyes:  Negative for photophobia and visual disturbance.  Respiratory:  Negative for cough, chest tightness, shortness of breath, wheezing and stridor.   Cardiovascular:  Negative for chest pain, palpitations and leg swelling.  Gastrointestinal:  Negative for abdominal distention, abdominal pain, anal bleeding, blood in stool, constipation, diarrhea, nausea and vomiting.  Genitourinary:  Negative for difficulty urinating, dysuria, flank pain and hematuria.  Musculoskeletal:  Negative for arthralgias, back pain, gait problem,  joint swelling and myalgias.  Skin:  Negative for color change, pallor, rash and wound.  Neurological:  Negative for dizziness, tremors, weakness and light-headedness.  Hematological:  Negative for adenopathy. Does not bruise/bleed easily.  Psychiatric/Behavioral:  Negative for agitation, behavioral problems, confusion, decreased concentration, dysphoric mood and sleep disturbance.        Objective:   Physical Exam Constitutional:      Appearance: He is well-developed.  HENT:     Head: Normocephalic and atraumatic.  Eyes:     Conjunctiva/sclera: Conjunctivae normal.  Cardiovascular:     Rate and Rhythm: Normal rate and regular rhythm.  Pulmonary:     Effort: Pulmonary effort is normal. No respiratory distress.  Breath sounds: No wheezing.  Abdominal:     General: There is no distension.     Palpations: Abdomen is soft.  Musculoskeletal:        General: No tenderness. Normal range of motion.     Cervical back: Normal range of motion and neck supple.  Skin:    General: Skin is warm and dry.     Coloration: Skin is not pale.     Findings: No erythema or rash.  Neurological:     General: No focal deficit present.     Mental Status: He is alert and oriented to person, place, and time.  Psychiatric:        Mood and Affect: Mood normal.        Behavior: Behavior normal.        Thought Content: Thought content normal.        Judgment: Judgment normal.           Assessment & Plan:   Depression and alcoholism:  I have given him resources for mental health, substance abuse in our community and at our clinic  He still has the paperwork and certainly will reach out.  In the interim I will schedule him for follow-up in a month time with me.  HIV AIDS  I will add order HIV viral load w reflex to genotype,  CD4 count CBC with differential CMP, RPR GC and chlamydia and I will continue  [employer] and Bactrim  Hypokalemia: Resolved we will recheck CMP  today.  Severe anemia likely anemia of chronic disease: Recheck CBC today  Vaccine counseling: recommended COVID 19 and flu shot.

## 2022-05-29 ENCOUNTER — Ambulatory Visit: Payer: PRIVATE HEALTH INSURANCE | Admitting: Internal Medicine

## 2022-06-18 ENCOUNTER — Other Ambulatory Visit (HOSPITAL_COMMUNITY): Payer: Self-pay

## 2022-11-11 ENCOUNTER — Emergency Department (HOSPITAL_COMMUNITY): Payer: No Typology Code available for payment source

## 2022-11-11 ENCOUNTER — Inpatient Hospital Stay (HOSPITAL_COMMUNITY)
Admission: EM | Admit: 2022-11-11 | Discharge: 2022-11-18 | DRG: 558 | Disposition: A | Discharge status: Home / Self Care | Payer: No Typology Code available for payment source | Attending: Internal Medicine | Admitting: Internal Medicine

## 2022-11-11 ENCOUNTER — Encounter (HOSPITAL_COMMUNITY): Payer: Self-pay

## 2022-11-11 ENCOUNTER — Other Ambulatory Visit: Payer: Self-pay

## 2022-11-11 DIAGNOSIS — M6282 Rhabdomyolysis: Secondary | ICD-10-CM | POA: Diagnosis not present

## 2022-11-11 DIAGNOSIS — R7989 Other specified abnormal findings of blood chemistry: Secondary | ICD-10-CM | POA: Diagnosis present

## 2022-11-11 DIAGNOSIS — E876 Hypokalemia: Secondary | ICD-10-CM | POA: Diagnosis present

## 2022-11-11 DIAGNOSIS — F1010 Alcohol abuse, uncomplicated: Secondary | ICD-10-CM | POA: Diagnosis present

## 2022-11-11 DIAGNOSIS — R531 Weakness: Secondary | ICD-10-CM

## 2022-11-11 DIAGNOSIS — B20 Human immunodeficiency virus [HIV] disease: Secondary | ICD-10-CM | POA: Diagnosis present

## 2022-11-11 DIAGNOSIS — Z79899 Other long term (current) drug therapy: Secondary | ICD-10-CM

## 2022-11-11 DIAGNOSIS — K7010 Alcoholic hepatitis without ascites: Secondary | ICD-10-CM | POA: Diagnosis present

## 2022-11-11 DIAGNOSIS — R748 Abnormal levels of other serum enzymes: Secondary | ICD-10-CM | POA: Diagnosis present

## 2022-11-11 DIAGNOSIS — F17290 Nicotine dependence, other tobacco product, uncomplicated: Secondary | ICD-10-CM | POA: Diagnosis present

## 2022-11-11 DIAGNOSIS — Z833 Family history of diabetes mellitus: Secondary | ICD-10-CM

## 2022-11-11 DIAGNOSIS — I10 Essential (primary) hypertension: Secondary | ICD-10-CM | POA: Diagnosis present

## 2022-11-11 DIAGNOSIS — D638 Anemia in other chronic diseases classified elsewhere: Secondary | ICD-10-CM | POA: Diagnosis present

## 2022-11-11 DIAGNOSIS — K219 Gastro-esophageal reflux disease without esophagitis: Secondary | ICD-10-CM | POA: Diagnosis present

## 2022-11-11 DIAGNOSIS — I445 Left posterior fascicular block: Secondary | ICD-10-CM | POA: Diagnosis present

## 2022-11-11 DIAGNOSIS — R5381 Other malaise: Secondary | ICD-10-CM | POA: Diagnosis present

## 2022-11-11 DIAGNOSIS — Z8249 Family history of ischemic heart disease and other diseases of the circulatory system: Secondary | ICD-10-CM

## 2022-11-11 DIAGNOSIS — Z91148 Patient's other noncompliance with medication regimen for other reason: Secondary | ICD-10-CM

## 2022-11-11 DIAGNOSIS — L600 Ingrowing nail: Secondary | ICD-10-CM | POA: Diagnosis present

## 2022-11-11 DIAGNOSIS — F411 Generalized anxiety disorder: Secondary | ICD-10-CM | POA: Diagnosis present

## 2022-11-11 DIAGNOSIS — F17210 Nicotine dependence, cigarettes, uncomplicated: Secondary | ICD-10-CM | POA: Diagnosis present

## 2022-11-11 DIAGNOSIS — Z21 Asymptomatic human immunodeficiency virus [HIV] infection status: Secondary | ICD-10-CM | POA: Diagnosis present

## 2022-11-11 LAB — HEPATITIS PANEL, ACUTE
HCV Ab: NONREACTIVE
Hep A IgM: NONREACTIVE
Hep B C IgM: NONREACTIVE
Hepatitis B Surface Ag: NONREACTIVE

## 2022-11-11 LAB — COMPREHENSIVE METABOLIC PANEL
ALT: 125 U/L — ABNORMAL HIGH (ref 0–44)
AST: 434 U/L — ABNORMAL HIGH (ref 15–41)
Albumin: 2.4 g/dL — ABNORMAL LOW (ref 3.5–5.0)
Alkaline Phosphatase: 148 U/L — ABNORMAL HIGH (ref 38–126)
Anion gap: 18 — ABNORMAL HIGH (ref 5–15)
BUN: <5 mg/dL — ABNORMAL LOW (ref 6–20)
CO2: 44 mmol/L — ABNORMAL HIGH (ref 22–32)
Calcium: 6.1 mg/dL — CL (ref 8.9–10.3)
Chloride: 75 mmol/L — ABNORMAL LOW (ref 98–111)
Creatinine, Ser: 0.8 mg/dL (ref 0.61–1.24)
GFR, Estimated: >60 mL/min (ref >60)
Glucose, Bld: 111 mg/dL — ABNORMAL HIGH (ref 70–99)
Potassium: <2 mmol/L — CL (ref 3.5–5.1)
Sodium: 137 mmol/L (ref 135–145)
Total Bilirubin: 3.5 mg/dL — ABNORMAL HIGH (ref 0.3–1.2)
Total Protein: 6.4 g/dL — ABNORMAL LOW (ref 6.5–8.1)

## 2022-11-11 LAB — BASIC METABOLIC PANEL
Anion gap: 14 (ref 5–15)
BUN: <5 mg/dL — ABNORMAL LOW (ref 6–20)
CO2: 42 mmol/L — ABNORMAL HIGH (ref 22–32)
Calcium: 6.6 mg/dL — ABNORMAL LOW (ref 8.9–10.3)
Chloride: 84 mmol/L — ABNORMAL LOW (ref 98–111)
Creatinine, Ser: 0.71 mg/dL (ref 0.61–1.24)
GFR, Estimated: >60 mL/min (ref >60)
Glucose, Bld: 124 mg/dL — ABNORMAL HIGH (ref 70–99)
Potassium: <2 mmol/L — CL (ref 3.5–5.1)
Sodium: 140 mmol/L (ref 135–145)

## 2022-11-11 LAB — URINALYSIS, ROUTINE W REFLEX MICROSCOPIC
Bilirubin Urine: NEGATIVE
Glucose, UA: NEGATIVE mg/dL
Ketones, ur: NEGATIVE mg/dL
Leukocytes,Ua: NEGATIVE
Nitrite: NEGATIVE
Protein, ur: 30 mg/dL — AB
Specific Gravity, Urine: 1.006 (ref 1.005–1.030)
pH: 7 (ref 5.0–8.0)

## 2022-11-11 LAB — PHOSPHORUS: Phosphorus: 4.5 mg/dL (ref 2.5–4.6)

## 2022-11-11 LAB — CBG MONITORING, ED: Glucose-Capillary: 129 mg/dL — ABNORMAL HIGH (ref 70–99)

## 2022-11-11 LAB — TROPONIN I (HIGH SENSITIVITY)
Troponin I (High Sensitivity): 30 ng/L — ABNORMAL HIGH (ref <18)
Troponin I (High Sensitivity): 25 ng/L — ABNORMAL HIGH (ref <18)

## 2022-11-11 LAB — PROTIME-INR
INR: 1.2 (ref 0.8–1.2)
Prothrombin Time: 15.4 seconds — ABNORMAL HIGH (ref 11.4–15.2)

## 2022-11-11 LAB — C DIFFICILE QUICK SCREEN W PCR REFLEX
C Diff antigen: NEGATIVE
C Diff interpretation: NOT DETECTED
C Diff toxin: NEGATIVE

## 2022-11-11 LAB — CBC
HCT: 30.8 % — ABNORMAL LOW (ref 39.0–52.0)
Hemoglobin: 11.1 g/dL — ABNORMAL LOW (ref 13.0–17.0)
MCH: 33.3 pg (ref 26.0–34.0)
MCHC: 36 g/dL (ref 30.0–36.0)
MCV: 92.5 fL (ref 80.0–100.0)
Platelets: 349 10*3/uL (ref 150–400)
RBC: 3.33 MIL/uL — ABNORMAL LOW (ref 4.22–5.81)
RDW: 17.2 % — ABNORMAL HIGH (ref 11.5–15.5)
WBC: 6.6 10*3/uL (ref 4.0–10.5)
nRBC: 0 % (ref 0.0–0.2)

## 2022-11-11 LAB — ETHANOL: Alcohol, Ethyl (B): <10 mg/dL (ref <10)

## 2022-11-11 LAB — TYPE AND SCREEN
ABO/RH(D): B POS
Antibody Screen: NEGATIVE

## 2022-11-11 LAB — MAGNESIUM
Magnesium: 0.9 mg/dL — CL (ref 1.7–2.4)
Magnesium: 1.8 mg/dL (ref 1.7–2.4)

## 2022-11-11 LAB — CK: Total CK: 29701 U/L — ABNORMAL HIGH (ref 49–397)

## 2022-11-11 MED ORDER — ACETAMINOPHEN 325 MG PO TABS: 650.0000 mg | ORAL_TABLET | Freq: Four times a day (QID) | ORAL | Status: DC | PRN

## 2022-11-11 MED ORDER — ALBUTEROL SULFATE (2.5 MG/3ML) 0.083% IN NEBU: 2.5000 mg | INHALATION_SOLUTION | Freq: Four times a day (QID) | RESPIRATORY_TRACT | Status: DC | PRN

## 2022-11-11 MED ORDER — POTASSIUM CHLORIDE CRYS ER 20 MEQ PO TBCR
40.0000 meq | EXTENDED_RELEASE_TABLET | ORAL | Status: AC
  Administered 2022-11-11 – 2022-11-12 (×3): 40 meq via ORAL
  Filled 2022-11-11 (×2): qty 2

## 2022-11-11 MED ORDER — SODIUM CHLORIDE 0.9 % IV SOLN: INTRAVENOUS | Status: DC

## 2022-11-11 MED ORDER — THIAMINE MONONITRATE 100 MG PO TABS
100.0000 mg | ORAL_TABLET | Freq: Every day | ORAL | Status: DC
  Administered 2022-11-11 – 2022-11-18 (×8): 100 mg via ORAL
  Filled 2022-11-11 (×8): qty 1

## 2022-11-11 MED ORDER — CALCIUM GLUCONATE-NACL 2-0.675 GM/100ML-% IV SOLN
2.0000 g | Freq: Once | INTRAVENOUS | Status: AC
  Administered 2022-11-11: 2000 mg via INTRAVENOUS
  Filled 2022-11-11: qty 100

## 2022-11-11 MED ORDER — FOLIC ACID 1 MG PO TABS
1.0000 mg | ORAL_TABLET | Freq: Every day | ORAL | Status: DC
  Administered 2022-11-11 – 2022-11-18 (×8): 1 mg via ORAL
  Filled 2022-11-11 (×8): qty 1

## 2022-11-11 MED ORDER — LORAZEPAM 2 MG/ML IJ SOLN: 0.0000 mg | Freq: Two times a day (BID) | INTRAMUSCULAR | Status: DC

## 2022-11-11 MED ORDER — CALCIUM GLUCONATE-NACL 1-0.675 GM/50ML-% IV SOLN
1.0000 g | Freq: Once | INTRAVENOUS | Status: AC
  Administered 2022-11-11: 1000 mg via INTRAVENOUS
  Filled 2022-11-11: qty 50

## 2022-11-11 MED ORDER — BICTEGRAVIR-EMTRICITAB-TENOFOV 50-200-25 MG PO TABS
1.0000 | ORAL_TABLET | Freq: Every day | ORAL | Status: DC
  Administered 2022-11-11 – 2022-11-18 (×8): 1 via ORAL
  Filled 2022-11-11 (×8): qty 1

## 2022-11-11 MED ORDER — POTASSIUM CHLORIDE CRYS ER 20 MEQ PO TBCR
40.0000 meq | EXTENDED_RELEASE_TABLET | Freq: Once | ORAL | Status: AC
  Administered 2022-11-11: 40 meq via ORAL
  Filled 2022-11-11: qty 2

## 2022-11-11 MED ORDER — SODIUM CHLORIDE 0.9% FLUSH
3.0000 mL | Freq: Two times a day (BID) | INTRAVENOUS | Status: DC
  Administered 2022-11-11 – 2022-11-18 (×11): 3 mL via INTRAVENOUS

## 2022-11-11 MED ORDER — SODIUM CHLORIDE 0.9 % IV BOLUS
1000.0000 mL | Freq: Once | INTRAVENOUS | Status: AC
  Administered 2022-11-11: 1000 mL via INTRAVENOUS

## 2022-11-11 MED ORDER — ADULT MULTIVITAMIN W/MINERALS CH
1.0000 | ORAL_TABLET | Freq: Every day | ORAL | Status: DC
  Administered 2022-11-11 – 2022-11-18 (×8): 1 via ORAL
  Filled 2022-11-11 (×8): qty 1

## 2022-11-11 MED ORDER — LORAZEPAM 2 MG/ML IJ SOLN
1.0000 mg | INTRAMUSCULAR | Status: AC | PRN
  Filled 2022-11-11: qty 1

## 2022-11-11 MED ORDER — TRIMETHOBENZAMIDE HCL 100 MG/ML IM SOLN: 200.0000 mg | Freq: Four times a day (QID) | INTRAMUSCULAR | Status: DC | PRN

## 2022-11-11 MED ORDER — LORAZEPAM 2 MG/ML IJ SOLN
0.0000 mg | Freq: Four times a day (QID) | INTRAMUSCULAR | Status: DC
  Administered 2022-11-11: 1 mg via INTRAVENOUS
  Administered 2022-11-11: 2 mg via INTRAVENOUS
  Administered 2022-11-12: 1 mg via INTRAVENOUS
  Filled 2022-11-11 (×2): qty 1

## 2022-11-11 MED ORDER — LORAZEPAM 2 MG/ML IJ SOLN
0.5000 mg | Freq: Once | INTRAMUSCULAR | Status: AC
  Administered 2022-11-11: 0.5 mg via INTRAVENOUS
  Filled 2022-11-11: qty 1

## 2022-11-11 MED ORDER — ENOXAPARIN SODIUM 40 MG/0.4ML IJ SOSY
40.0000 mg | PREFILLED_SYRINGE | INTRAMUSCULAR | Status: DC
  Administered 2022-11-12 – 2022-11-17 (×6): 40 mg via SUBCUTANEOUS
  Filled 2022-11-11 (×6): qty 0.4

## 2022-11-11 MED ORDER — POTASSIUM CHLORIDE IN NACL 40-0.9 MEQ/L-% IV SOLN
INTRAVENOUS | Status: DC
  Administered 2022-11-14: 150 mL/h via INTRAVENOUS
  Filled 2022-11-11 (×10): qty 1000

## 2022-11-11 MED ORDER — ACETAMINOPHEN 650 MG RE SUPP: 650.0000 mg | Freq: Four times a day (QID) | RECTAL | Status: DC | PRN

## 2022-11-11 MED ORDER — MAGNESIUM OXIDE -MG SUPPLEMENT 400 (240 MG) MG PO TABS
400.0000 mg | ORAL_TABLET | Freq: Two times a day (BID) | ORAL | Status: DC
  Administered 2022-11-11 – 2022-11-12 (×2): 400 mg via ORAL
  Filled 2022-11-11 (×2): qty 1

## 2022-11-11 MED ORDER — POTASSIUM CHLORIDE 10 MEQ/100ML IV SOLN
10.0000 meq | INTRAVENOUS | Status: AC
  Administered 2022-11-11 (×3): 10 meq via INTRAVENOUS
  Filled 2022-11-11 (×3): qty 100

## 2022-11-11 MED ORDER — LORAZEPAM 1 MG PO TABS
1.0000 mg | ORAL_TABLET | ORAL | Status: AC | PRN
  Administered 2022-11-12 – 2022-11-14 (×7): 1 mg via ORAL
  Filled 2022-11-11 (×7): qty 1

## 2022-11-11 MED ORDER — THIAMINE HCL 100 MG/ML IJ SOLN
100.0000 mg | Freq: Every day | INTRAMUSCULAR | Status: DC
  Filled 2022-11-11: qty 2

## 2022-11-11 MED ORDER — MAGNESIUM SULFATE 4 GM/100ML IV SOLN
4.0000 g | Freq: Once | INTRAVENOUS | Status: AC
  Administered 2022-11-11: 4 g via INTRAVENOUS
  Filled 2022-11-11: qty 100

## 2022-11-11 NOTE — ED Provider Notes (Signed)
[place] EMERGENCY DEPARTMENT AT [facility] Provider Note   CSN: [id number] Arrival date & time: 11/11/22  4098     History  Chief Complaint  Patient presents with   Fatigue   Weakness    Harold Flores is a 32 y.o. male.  32 year old male with prior medical history as detailed below presents for evaluation.  Patient with complaint of worsening weakness times approximately 2 weeks.  Patient with history of GBS in 2022.  Patient with history of significant electrolyte derangements in 2023 that required admission.  Patient reports increasing weakness primarily in his shoulders and upper extremities.  Patient reports mild weakness in both lower extremities.  Patient continues to drink alcohol daily.  He is reporting approximately 10 beers daily.  His last drink was yesterday.  He reports noncompliance with previous prescribed antiviral medications.  He is unsure what his viral load is.  He has not seen his infectious disease care provider in quite some time.  He denies fever.  He denies pain.  The history is provided by the patient and medical records.       Home Medications Prior to Admission medications   Medication Sig Start Date End Date Taking? Authorizing Provider  amLODipine (NORVASC) 10 MG tablet TAKE 1 TABLET (10 MG TOTAL) BY MOUTH DAILY. 04/04/22 04/04/23  Randall Hiss, MD  bictegravir-emtricitabine-tenofovir AF (BIKTARVY) 50-200-25 MG TABS tablet TAKE 1 TABLET BY MOUTH DAILY. 04/03/22 04/03/23  Randall Hiss, MD  carvedilol (COREG) 12.5 MG tablet TAKE 1 TABLET BY MOUTH 2 (TWO) TIMES DAILY. MUST KEEP SCHEDULED APPT FOR FUTURE REFILLS Patient taking differently: Take 12.5 mg by mouth daily. TAKE 1 TABLET BY MOUTH 2 (TWO) TIMES DAILY. MUST KEEP SCHEDULED APPT FOR FUTURE REFILLS 11/26/21   Corwin Levins, MD  cyanocobalamin 1000 MCG tablet Take 1 tablet (1,000 mcg total) by mouth daily. 03/30/22   Glade Lloyd, MD  fluconazole (DIFLUCAN) 100  MG tablet Take 1 tablet (100 mg total) by mouth daily. Patient not taking: Reported on 03/26/2022 03/25/22   Daiva Eves, Lisette Grinder, MD  folic acid (FOLVITE) 1 MG tablet Take 2 tablets (2 mg total) by mouth daily. 03/30/22   Glade Lloyd, MD  lansoprazole (PREVACID) 30 MG capsule Take 1 capsule (30 mg total) by mouth daily. 11/26/21   Corwin Levins, MD  loperamide (IMODIUM) 2 MG capsule Take 1 capsule (2 mg total) by mouth as needed for diarrhea or loose stools. 03/30/22   Glade Lloyd, MD  Magnesium Oxide 400 MG CAPS Take 1 capsule (400 mg total) by mouth 2 (two) times daily. 03/30/22   Glade Lloyd, MD  Multiple Vitamin (MULTIVITAMIN WITH MINERALS) TABS tablet Take 1 tablet by mouth daily. 03/30/22   Glade Lloyd, MD  potassium chloride SA (KLOR-CON M) 20 MEQ tablet Take 1 tablet (20 mEq total) by mouth 2 (two) times daily. 03/30/22   Glade Lloyd, MD  sulfamethoxazole-trimethoprim (BACTRIM DS) 800-160 MG tablet TAKE 1 TABLET BY MOUTH DAILY. 04/03/22 04/03/23  Randall Hiss, MD  thiamine (VITAMIN B-1) 100 MG tablet Take 1 tablet (100 mg total) by mouth daily. 03/30/22   Glade Lloyd, MD      Allergies    Patient has no known allergies.    Review of Systems   Review of Systems  All other systems reviewed and are negative.   Physical Exam Updated Vital Signs BP 123/85   Pulse 100   Temp 98.2 F (36.8 C) (Oral)  Resp 15   Ht 5\' 10"  (1.778 m)   Wt 79.4 kg   SpO2 97%   BMI 25.11 kg/m  Physical Exam Vitals and nursing note reviewed.  Constitutional:      General: He is not in acute distress.    Appearance: Normal appearance. He is well-developed.  HENT:     Head: Normocephalic and atraumatic.  Eyes:     Conjunctiva/sclera: Conjunctivae normal.     Pupils: Pupils are equal, round, and reactive to light.  Cardiovascular:     Rate and Rhythm: Normal rate and regular rhythm.     Heart sounds: Normal heart sounds.  Pulmonary:     Effort: Pulmonary effort is  normal. No respiratory distress.     Breath sounds: Normal breath sounds.  Abdominal:     General: There is no distension.     Palpations: Abdomen is soft.     Tenderness: There is no abdominal tenderness.  Musculoskeletal:        General: No deformity. Normal range of motion.     Cervical back: Normal range of motion and neck supple.  Skin:    General: Skin is warm and dry.  Neurological:     General: No focal deficit present.     Mental Status: He is alert and oriented to person, place, and time.     Comments: Alert, oriented x 3, normal speech, no facial droop  Patient with 4/5 grip strength of both hands.  Patient with difficulty lifting his upper arm bilaterally up off the stretcher.  Patient reports difficulty lifting both lower extremities off the structure although he is able to do so and keep them approximately 6 inches off the stretcher for approximately 5 to 7 seconds.     ED Results / Procedures / Treatments   Labs (all labs ordered are listed, but only abnormal results are displayed) Labs Reviewed  CBC - Abnormal; Notable for the following components:      Result Value   RBC 3.33 (*)    Hemoglobin 11.1 (*)    HCT 30.8 (*)    RDW 17.2 (*)    All other components within normal limits  COMPREHENSIVE METABOLIC PANEL - Abnormal; Notable for the following components:   Potassium <2.0 (*)    Chloride 75 (*)    CO2 44 (*)    Glucose, Bld 111 (*)    BUN <5 (*)    Calcium 6.1 (*)    Total Protein 6.4 (*)    Albumin 2.4 (*)    AST 434 (*)    ALT 125 (*)    Alkaline Phosphatase 148 (*)    Total Bilirubin 3.5 (*)    Anion gap 18 (*)    All other components within normal limits  CK - Abnormal; Notable for the following components:   Total CK 29,701 (*)    All other components within normal limits  CBG MONITORING, ED - Abnormal; Notable for the following components:   Glucose-Capillary 129 (*)    All other components within normal limits  TROPONIN I (HIGH  SENSITIVITY) - Abnormal; Notable for the following components:   Troponin I (High Sensitivity) 30 (*)    All other components within normal limits  URINALYSIS, ROUTINE W REFLEX MICROSCOPIC  ETHANOL  MAGNESIUM  PHOSPHORUS  I-STAT CHEM 8, ED  TYPE AND SCREEN  TROPONIN I (HIGH SENSITIVITY)    EKG EKG Interpretation  Date/Time:  Monday November 11 2022 07:50:58 EDT Ventricular Rate:  100 PR Interval:  163 QRS Duration: 103 QT Interval:  410 QTC Calculation: 529 R Axis:   110 Text Interpretation: Sinus tachycardia Left posterior fascicular block Anterior infarct, age indeterminate Repol abnrm, severe global ischemia (LM/MVD) Prolonged QT interval Confirmed by Kristine Royal [phone]) on 11/11/2022 8:16:01 AM  Radiology DG Chest Port 1 View  Result Date: 11/11/2022 CLINICAL DATA:  Weakness EXAM: PORTABLE CHEST 1 VIEW COMPARISON:  03/26/2022 FINDINGS: The heart size and mediastinal contours are within normal limits. Both lungs are clear. The visualized skeletal structures are unremarkable. IMPRESSION: No active disease. Electronically Signed   By: Judie Petit.  Shick M.D.   On: 11/11/2022 08:36    Procedures Procedures    Medications Ordered in ED Medications  potassium chloride 10 mEq in 100 mL IVPB (10 mEq Intravenous New Bag/Given 11/11/22 1056)  LORazepam (ATIVAN) injection 0.5 mg (has no administration in time range)  enoxaparin (LOVENOX) injection 40 mg (has no administration in time range)  sodium chloride flush (NS) 0.9 % injection 3 mL (has no administration in time range)  acetaminophen (TYLENOL) tablet 650 mg (has no administration in time range)    Or  acetaminophen (TYLENOL) suppository 650 mg (has no administration in time range)  albuterol (PROVENTIL) (2.5 MG/3ML) 0.083% nebulizer solution 2.5 mg (has no administration in time range)  trimethobenzamide (TIGAN) injection 200 mg (has no administration in time range)  sodium chloride 0.9 % 1,000 mL with potassium chloride 40 mEq infusion  (has no administration in time range)  potassium chloride SA (KLOR-CON M) CR tablet 40 mEq (40 mEq Oral Given 11/11/22 0956)  calcium gluconate 1 g/ 50 mL sodium chloride IVPB (0 mg Intravenous Stopped 11/11/22 1055)  sodium chloride 0.9 % bolus 1,000 mL (1,000 mLs Intravenous New Bag/Given 11/11/22 1056)    ED Course/ Medical Decision Making/ A&P                             Medical Decision Making Amount and/or Complexity of Data Reviewed Labs: ordered. Radiology: ordered.  Risk Prescription drug management. Decision regarding hospitalization.    Medical Screen Complete  This patient presented to the ED with complaint of weakness.  This complaint involves an extensive number of treatment options. The initial differential diagnosis includes, but is not limited to, metabolic abnormality, alcohol abuse, GBS, etc.  This presentation is: Acute, Chronic, Self-Limited, Previously Undiagnosed, Uncertain Prognosis, Complicated, Systemic Symptoms, and Threat to Life/Bodily Function  Patient is presenting with gradually worsening weakness times approximately 2 weeks.  Patient with prior history of GBS.  Patient with HIV.  Patient is admitting to noncompliance with previously prescribed medications.  Patient is also admitting to frequent daily heavy alcohol use.  Last alcohol consumption was yesterday.  Patient with similar presentation in October 2023 with weakness secondary to profound hypokalemia and other electrolyte abnormalities.  Case briefly discussed with neurology today.  Overall incidence of recurrent GBS is extremely low.  If patient's workup does not reveal evidence of other cause for weakness then MRI would be appropriate to further evaluate for neurologic cause.  Patient's workup is concerning for significant electrolyte derangement.  Patient's potassium is undetectable.  Ionized calcium is 0.65.  CK is significantly elevated.  Electrolyte replacement initiated here in  the ED.  Patient given IV fluids.  Patient will require admission for further workup and treatment.  Hospitalist service is aware of case.  Additional history obtained:  External records from outside sources obtained and reviewed including prior ED visits  and prior Inpatient records.    Lab Tests:  I ordered and personally interpreted labs.    Imaging Studies ordered:  I ordered imaging studies including CXR  I independently visualized and interpreted obtained imaging which showed NAD I agree with the radiologist interpretation.   Cardiac Monitoring:  The patient was maintained on a cardiac monitor.  I personally viewed and interpreted the cardiac monitor which showed an underlying rhythm of: NSR   Medicines ordered:  I ordered medication including calcium, potassium, IV fluids for hypokalemia, hypocalcemia Reevaluation of the patient after these medicines showed that the patient: improved  Problem List / ED Course:  Weakness, hypokalemia, hypocalcemia, elevated CK   Reevaluation:  After the interventions noted above, I reevaluated the patient and found that they have: improved   Disposition:  After consideration of the diagnostic results and the patients response to treatment, I feel that the patent would benefit from admission.   CRITICAL CARE Performed by: Wynetta Fines   Total critical care time: 30 minutes  Critical care time was exclusive of separately billable procedures and treating other patients.  Critical care was necessary to treat or prevent imminent or life-threatening deterioration.  Critical care was time spent personally by me on the following activities: development of treatment plan with patient and/or surrogate as well as nursing, discussions with consultants, evaluation of patient's response to treatment, examination of patient, obtaining history from patient or surrogate, ordering and performing treatments and interventions, ordering and  review of laboratory studies, ordering and review of radiographic studies, pulse oximetry and re-evaluation of patient's condition.          Final Clinical Impression(s) / ED Diagnoses Final diagnoses:  Weakness  Hypokalemia  Hypocalcemia  Alcohol abuse    Rx / DC Orders ED Discharge Orders     None         Wynetta Fines, MD 11/11/22 1142

## 2022-11-11 NOTE — H&P (Signed)
History and Physical    Patient: Harold Flores DOB: 01/19/1991 DOA: 11/11/2022 DOS: the patient was seen and examined on 11/11/2022 PCP: Corwin Levins, MD  Patient coming from: Home  Chief Complaint:  Chief Complaint  Patient presents with   Fatigue   Weakness   HPI: Harold Flores is a 32 y.o. male with medical history significant of hypertension, HIV, syphilis, genital herpes, Katheran Awe syndrome in 2022, anxiety, and alcohol abuse who presents with progressive weakness over the last 3 weeks.  Symptoms initially started with weakness in his neck that progressed down to his shoulders and then hands.  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  Associated symptoms include diarrhea for which he has been having at least 3 bowel movements per day and has had 2 episodes of vomiting.  Diarrhea and emesis are both reported to be nonbloody in appearance.  Denies any recent falls(last fall reported over a month ago) patient report weakness symptoms progressed and started affecting his legs as well which she became more concerned.  Patient does admit that he has not been consistently taking his antiviral medicines.  He does also admit to drinking alcohol heavily drinking 10 "bootlegers" with 12% alcohol per day on average.  His last drink was yesterday.  He also made note of an ingrown toenail for which a family member tried to remove, but has not completely healed since.  Records note similar hospitalization 2023 due to electrolyte derangements.  In the emergency department patient was noted to be afebrile with tachycardia and all other vitals relatively maintained.  Labs significant for WBC 6.6, hemoglobin 11.1, potassium less than 2, chloride 75, CO2 44, BUN less than 5, creatinine 0.8, calcium 6.1, anion gap 18, alkaline phosphatase 148, AST 434, ALT 125, and total bilirubin 3.5.  Chest x-ray showed no acute abnormality.  Urinalysis was positive for moderate hemoglobin, rare bacteria, 0-5 RBC/hpf, and 0-5 WBCs. Patient has been given normal saline 1 L IV, potassium chloride 40 meq p.o., potassium chloride 30 mEq IV Ativan 0.5 mg, and calcium gluconate 1 g IV.  Case had been discussed with neurology who felt symptoms were more likely secondary to patient's electrolytes derangements than Katheran Awe syndrome.  Review of Systems: As mentioned in the history of present illness. All other systems reviewed and are negative. Past Medical History:  Diagnosis Date   Anal fissure    Anal fistula    Anal ulcer    Asthma    Epistaxis    GAD (generalized anxiety disorder)    GERD (gastroesophageal reflux disease)    Hemorrhoid    HIV (human immunodeficiency virus infection) (HCC)    Hypertension    Syphilis    Thrush 03/25/2022   Varicella    Past Surgical History:  Procedure Laterality Date   EYE SURGERY  1994   blephroplasty right eye   RADIOLOGY WITH ANESTHESIA N/A 08/08/2020   Procedure: MRI WITH ANESTHESIA- BRAIN WITH AND WITHOUT CONTRAST,  CERVICAL SPINE WITH AND WITHOUT CONTRAST, LUMBAR SPINE WITH WITHOUT CONTRAST, THORACIC SPINE WITH WITHOUT CONTRAST;  Surgeon: Radiologist, Medication, MD;  Location: [facility] OR;  Service: Radiology;  Laterality: N/A;   Social History:  reports that he has been smoking e-cigarettes and cigarettes. He has a 2.50 pack-year smoking history. He has never used smokeless tobacco. He reports current alcohol use of about 7.0 standard drinks of alcohol per week. He reports current drug use.  Drug: Marijuana.  No Known Allergies  Family History  Problem Relation Age of Onset   Diabetes Mother    Hyperlipidemia Mother    Diabetes Maternal Grandmother    Heart disease Paternal Grandfather    Cancer Neg Hx     Prior to Admission medications   Medication Sig Start Date End Date Taking? Authorizing Provider  amLODipine (NORVASC) 10 MG tablet TAKE 1 TABLET (10 MG TOTAL) BY MOUTH DAILY.  04/04/22 04/04/23  Randall Hiss, MD  bictegravir-emtricitabine-tenofovir AF (BIKTARVY) 50-200-25 MG TABS tablet TAKE 1 TABLET BY MOUTH DAILY. 04/03/22 04/03/23  Randall Hiss, MD  carvedilol (COREG) 12.5 MG tablet TAKE 1 TABLET BY MOUTH 2 (TWO) TIMES DAILY. MUST KEEP SCHEDULED APPT FOR FUTURE REFILLS Patient taking differently: Take 12.5 mg by mouth daily. TAKE 1 TABLET BY MOUTH 2 (TWO) TIMES DAILY. MUST KEEP SCHEDULED APPT FOR FUTURE REFILLS 11/26/21   Corwin Levins, MD  cyanocobalamin 1000 MCG tablet Take 1 tablet (1,000 mcg total) by mouth daily. 03/30/22   Glade Lloyd, MD  fluconazole (DIFLUCAN) 100 MG tablet Take 1 tablet (100 mg total) by mouth daily. Patient not taking: Reported on 03/26/2022 03/25/22   Daiva Eves, Lisette Grinder, MD  folic acid (FOLVITE) 1 MG tablet Take 2 tablets (2 mg total) by mouth daily. 03/30/22   Glade Lloyd, MD  lansoprazole (PREVACID) 30 MG capsule Take 1 capsule (30 mg total) by mouth daily. 11/26/21   Corwin Levins, MD  loperamide (IMODIUM) 2 MG capsule Take 1 capsule (2 mg total) by mouth as needed for diarrhea or loose stools. 03/30/22   Glade Lloyd, MD  Magnesium Oxide 400 MG CAPS Take 1 capsule (400 mg total) by mouth 2 (two) times daily. 03/30/22   Glade Lloyd, MD  Multiple Vitamin (MULTIVITAMIN WITH MINERALS) TABS tablet Take 1 tablet by mouth daily. 03/30/22   Glade Lloyd, MD  potassium chloride SA (KLOR-CON M) 20 MEQ tablet Take 1 tablet (20 mEq total) by mouth 2 (two) times daily. 03/30/22   Glade Lloyd, MD  sulfamethoxazole-trimethoprim (BACTRIM DS) 800-160 MG tablet TAKE 1 TABLET BY MOUTH DAILY. 04/03/22 04/03/23  Randall Hiss, MD  thiamine (VITAMIN B-1) 100 MG tablet Take 1 tablet (100 mg total) by mouth daily. 03/30/22   Glade Lloyd, MD    Physical Exam: Vitals:   11/11/22 0750 11/11/22 0900 11/11/22 0945 11/11/22 1045  BP: (!) 128/101 133/82 115/69 123/85  Pulse: 100 (!) 105 (!) 102 100  Resp: 12 18 15 15    Temp: 98.2 F (36.8 C)     TempSrc: Oral     SpO2: 99% 96% 96% 97%  Weight:      Height:        Constitutional: Young male who appears to be in no acute distress Eyes: PERRL, lids and conjunctivae normal ENMT: Mucous membranes are moist. Posterior pharynx clear of any exudate or lesions.  Neck: normal, supple  Respiratory: clear to auscultation bilaterally, no wheezing, no crackles. Normal respiratory effort.   Cardiovascular: Tachycardic. No extremity edema. 2+ pedal pulses.  Abdomen: no tenderness, no masses palpated. No hepatosplenomegaly. Bowel sounds positive.  Musculoskeletal: no clubbing / cyanosis. No joint deformity upper and lower extremities.   Skin: Wound left great toe without active drainage or significant erythema appreciated as seen below   Neurologic: CN 2-12 grossly intact.  Able to move all extremities. Psychiatric: Normal judgment and insight. Alert and oriented x 3. Normal mood.   Data Reviewed:  EKG  reveals sinus tachycardia at 100 bpm with a left posterior fascicular block.  Reviewed labs, imaging, and pertinent records as noted above in HPI.  Assessment and Plan:  Weakness Nontraumatic rhabdomyolysis Acute.  Patient presents with complaints of progressive weakness of the bilateral upper extremities more so than the lower extremities.  Denies any recent falls or injury to onset symptoms.  CK  29,701.  Urinalysis with moderate hemoglobin, but 0-5 RBC/hpf consistent with rhabdomyolysis. -Admit to a progressive bed -Bedrest -Strict I&Os -Check CK levels daily -Normal saline IV fluids at 125 mL/h with 40 mEq of KCl -Will need to formally consult PT/OT once patient CK levels trended down  Hypokalemia Hypocalcemia Hypomagnesemia Acute.  On admission potassium noted to be less than 2, calcium 6.1, and magnesium 0.9.  Electrolyte abnormalities thought secondary to patient's history of alcohol abuse and likely cause of patient's complaints of progressive  weakness. -Serial monitoring of electrolytes and replace as needed  Elevated troponin Acute.  High-sensitivity troponin 30-> 25 and noted to be trending down.  EKG without significant ischemic changes.  Patient denied any complaints of chest pain. -Continue to monitor  Alcohol abuse  Patient admits to drinking 10 bootlegger bottles per day on average -CIWA protocols with scheduled and as needed Ativan -Thiamine, folic acid, MVI -Continue to counsel need of cessation of alcohol use.  Normocytic anemia Hemoglobin 11.1 g/dL which appears improved from prior.  No reports of bleeding in emesis or diarrhea. -Recheck CBC tomorrow morning  HIV/AIDS Patient reports intermittent compliance with medications.  Last CD4 count was 226 in 03/2022. Stressed importance of taking medications on a consistent basis.  Patient missed last appointment with infectious disease on 05/06/2022. -Continue Biktarvy -Patient needs to be set up with follow-up appointment with infectious disease prior to discharge  Ingrown toenail Patient with ingrown toenail of the left great toe.  No significant erythema appreciated on physical exam. -May warrant referral to podiatry in the outpatient setting  Elevated liver enzymes Acute.  On admission alkaline phosphatase 148, AST 434, and ALT 125.  Thought secondary to patient's alcohol abuse and/or rhabdomyolysis. -Check PT/INR(to further evaluate for alcohol hepatitis and need of IV steroids) and hepatitis panel  DVT prophylaxis: Lovenox Advance Care Planning:   Code Status: Full Code   Consults: None  Family Communication: Patient declined need to update family.  Severity of Illness: The appropriate patient status for this patient is OBSERVATION. Observation status is judged to be reasonable and necessary in order to provide the required intensity of service to ensure the patient's safety. The patient's presenting symptoms, physical exam findings, and initial  radiographic and laboratory data in the context of their medical condition is felt to place them at decreased risk for further clinical deterioration. Furthermore, it is anticipated that the patient will be medically stable for discharge from the hospital within 2 midnights of admission.   Author: Clydie Braun, MD 11/11/2022 11:06 AM  For on call review [email or web address].

## 2022-11-11 NOTE — ED Notes (Signed)
Elexes RN notified of critical lab reported-Magnesium 0.9  .

## 2022-11-11 NOTE — ED Triage Notes (Signed)
Pt BIBPTAR from home with c/o weakness x 2 weeks, progressively worse for 2 days. C/o diarrhea and medication noncompliance. 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 In 2022 loss the ability to walk and needed rehab. Pt reports ETOH use daily.   Hx GBS, HIV, and HTN   144/100 110 hr 166 cbg

## 2022-11-11 NOTE — ED Notes (Signed)
DrMessick sahown results of [employer] [facility] results. ED-Lab.

## 2022-11-11 NOTE — Plan of Care (Signed)

## 2022-11-11 NOTE — ED Notes (Signed)
ED TO INPATIENT HANDOFF REPORT  ED Nurse Name and Phone #: [phone]  S Name/Age/Gender [name] 32 y.o. male Room/Bed: 025C/025C  Code Status   Code Status: Full Code  Home/SNF/Other Home Patient oriented to: self, place, time, and situation Is this baseline? Yes   Triage Complete: Triage complete  Chief Complaint Weakness [R53.1]  Triage Note Pt BIBPTAR from home with c/o weakness x 2 weeks, progressively worse for 2 days. C/o diarrhea and medication noncompliance. 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 In 2022 loss the ability to walk and needed rehab. Pt reports ETOH use daily.   Hx GBS, HIV, and HTN   144/100 110 hr 166 cbg    Allergies No Known Allergies  Level of Care/Admitting Diagnosis ED Disposition     ED Disposition  Admit   Condition  --   Comment  Hospital Area: [facility] [[id number]]  Level of Care: Progressive [102]  Admit to Progressive based on following criteria: ACUTE MENTAL DISORDER-RELATED Drug/Alcohol Ingestion/Overdose/Withdrawal, Suicidal Ideation/attempt requiring safety sitter and < Q2h monitoring/assessments, moderate to severe agitation that is managed with medication/sitter, CIWA-Ar score < 20.  May place patient in observation at [facility] or [facility] if equivalent level of care is available:: No  Covid Evaluation: Asymptomatic - no recent exposure (last 10 days) testing not required  Diagnosis: Weakness [241835]  Admitting Physician: Clydie Braun [[id number]]  Attending Physician: Clydie Braun [[id number]]          B Medical/Surgery History Past Medical History:  Diagnosis Date   Anal fissure    Anal fistula    Anal ulcer    Asthma    Epistaxis    GAD (generalized anxiety disorder)    GERD (gastroesophageal reflux disease)     Hemorrhoid    HIV (human immunodeficiency virus infection) (HCC)    Hypertension    Syphilis    Thrush 03/25/2022   Varicella    Past Surgical History:  Procedure Laterality Date   EYE SURGERY  1994   blephroplasty right eye   RADIOLOGY WITH ANESTHESIA N/A 08/08/2020   Procedure: MRI WITH ANESTHESIA- BRAIN WITH AND WITHOUT CONTRAST,  CERVICAL SPINE WITH AND WITHOUT CONTRAST, LUMBAR SPINE WITH WITHOUT CONTRAST, THORACIC SPINE WITH WITHOUT CONTRAST;  Surgeon: Radiologist, Medication, MD;  Location: [facility] OR;  Service: Radiology;  Laterality: N/A;     A IV Location/Drains/Wounds Patient Lines/Drains/Airways Status     Active Line/Drains/Airways     Name Placement date Placement time Site Days   Peripheral IV 11/11/22 20 G Left Antecubital 11/11/22  0806  Antecubital  less than 1            Intake/Output Last 24 hours  Intake/Output Summary (Last 24 hours) at 11/11/2022 1235 Last data filed at 11/11/2022 1203 Gross per 24 hour  Intake 103 ml  Output --  Net 103 ml    Labs/Imaging Results for orders placed or performed during the hospital encounter of 11/11/22 (from the past 48 hour(s))  CBC     Status: Abnormal   Collection Time: 11/11/22  8:04 AM  Result Value Ref Range   WBC 6.6 4.0 - 10.5 K/uL   RBC 3.33 (L) 4.22 - 5.81 MIL/uL   Hemoglobin 11.1 (L) 13.0 - 17.0 g/dL   HCT 95.6 (L) 21.3 - 08.6 %  MCV 92.5 80.0 - 100.0 fL   MCH 33.3 26.0 - 34.0 pg   MCHC 36.0 30.0 - 36.0 g/dL   RDW 16.1 (H) 09.6 - 04.5 %   Platelets 349 150 - 400 K/uL   nRBC 0.0 0.0 - 0.2 %    Comment: Performed at [facility], [address] [place], [place] [address]  Comprehensive metabolic panel     Status: Abnormal   Collection Time: 11/11/22  8:04 AM  Result Value Ref Range   Sodium 137 135 - 145 mmol/L   Potassium <2.0 (LL) 3.5 - 5.1 mmol/L    Comment: CRITICAL RESULT CALLED TO, READ BACK BY AND VERIFIED WITH E.Alphons Burgert,RN 1914 11/11/22 CLARK,S   Chloride 75 (L) 98 - 111 mmol/L   CO2  44 (H) 22 - 32 mmol/L   Glucose, Bld 111 (H) 70 - 99 mg/dL    Comment: Glucose reference range applies only to samples taken after fasting for at least 8 hours.   BUN <5 (L) 6 - 20 mg/dL   Creatinine, Ser 7.82 0.61 - 1.24 mg/dL   Calcium 6.1 (LL) 8.9 - 10.3 mg/dL    Comment: CRITICAL RESULT CALLED TO, READ BACK BY AND VERIFIED WITH E.Lailoni Baquera,RN 9562 11/11/22 CLARK,S   Total Protein 6.4 (L) 6.5 - 8.1 g/dL   Albumin 2.4 (L) 3.5 - 5.0 g/dL   AST 130 (H) 15 - 41 U/L   ALT 125 (H) 0 - 44 U/L   Alkaline Phosphatase 148 (H) 38 - 126 U/L   Total Bilirubin 3.5 (H) 0.3 - 1.2 mg/dL   GFR, Estimated >86 >57 mL/min    Comment: (NOTE) Calculated using the CKD-EPI Creatinine Equation (2021)    Anion gap 18 (H) 5 - 15    Comment: Performed at [facility], [address], [place], [place] [address]  CK     Status: Abnormal   Collection Time: 11/11/22  8:04 AM  Result Value Ref Range   Total CK 29,701 (H) 49 - 397 U/L    Comment: RESULT CONFIRMED BY MANUAL DILUTION Performed at [facility], [address] [place], [place] [address]   Troponin I (High Sensitivity)     Status: Abnormal   Collection Time: 11/11/22  8:04 AM  Result Value Ref Range   Troponin I (High Sensitivity) 30 (H) <18 ng/L    Comment: (NOTE) Elevated high sensitivity troponin I (hsTnI) values and significant  changes across serial measurements may suggest ACS but many other  chronic and acute conditions are known to elevate hsTnI results.  Refer to the "Links" section for chest pain algorithms and additional  guidance. Performed at [facility], [address] [place], [place] [address]   CBG monitoring, ED     Status: Abnormal   Collection Time: 11/11/22  8:17 AM  Result Value Ref Range   Glucose-Capillary 129 (H) 70 - 99 mg/dL    Comment: Glucose reference range applies only to samples taken after fasting for at least 8 hours.  Type and screen [facility]     Status: None   Collection  Time: 11/11/22 11:28 AM  Result Value Ref Range   ABO/RH(D) B POS    Antibody Screen NEG    Sample Expiration      11/14/2022,2359 Performed at [facility], [address], [place], [place] [address]    US Abdomen Limited RUQ (LIVER/GB)  Result Date: 11/11/2022 CLINICAL DATA:  Transaminitis EXAM:  ULTRASOUND ABDOMEN LIMITED RIGHT UPPER QUADRANT COMPARISON:  None available FINDINGS: Gallbladder: Gallstones: None Sludge: None Gallbladder Wall: Within normal limits Pericholecystic fluid: None Sonographic Murphy's Sign: Negative per technologist Common bile duct: Diameter: 4 mm Liver: Parenchymal echogenicity: Diffusely increased Contours: Normal Lesions: None Portal vein: Patent.  Hepatopetal flow Other: None. IMPRESSION: Diffuse increased echogenicity of the hepatic parenchyma is a nonspecific indicator of hepatocellular dysfunction, most commonly steatosis. Electronically Signed   By: Acquanetta Belling M.D.   On: 11/11/2022 11:38   DG Chest Port 1 View  Result Date: 11/11/2022 CLINICAL DATA:  Weakness EXAM: PORTABLE CHEST 1 VIEW COMPARISON:  03/26/2022 FINDINGS: The heart size and mediastinal contours are within normal limits. Both lungs are clear. The visualized skeletal structures are unremarkable. IMPRESSION: No active disease. Electronically Signed   By: Judie Petit.  Shick M.D.   On: 11/11/2022 08:36    Pending Labs Unresulted Labs (From admission, onward)     Start     Ordered   11/12/22 0500  CBC  Tomorrow morning,   R        11/11/22 1132   11/12/22 0500  Comprehensive metabolic panel  Tomorrow morning,   R        11/11/22 1132   11/12/22 0500  CK  Daily,   R      11/11/22 1147   11/12/22 0500  Magnesium  Tomorrow morning,   R        11/11/22 1147   11/11/22 0821  Magnesium  Once,   STAT        11/11/22 0820   11/11/22 0821  Phosphorus  Once,   STAT        11/11/22 0820   11/11/22 0820  Ethanol  Once,   URGENT        11/11/22 0819   11/11/22 0752  Urinalysis, Routine w reflex  microscopic -Urine, Clean Catch  Once,   URGENT       Question:  Specimen Source  Answer:  Urine, Clean Catch   11/11/22 0752            Vitals/Pain Today's Vitals   11/11/22 1145 11/11/22 1158 11/11/22 1200 11/11/22 1230  BP: 130/85  (!) 128/90 119/83  Pulse: (!) 101  (!) 102 (!) 108  Resp: (!) 25  19 (!) 21  Temp:  98.1 F (36.7 C)    TempSrc:  Oral    SpO2: 98%  100% 94%  Weight:      Height:      PainSc:        Isolation Precautions No active isolations  Medications Medications  enoxaparin (LOVENOX) injection 40 mg (40 mg Subcutaneous Not Given 11/11/22 1203)  sodium chloride flush (NS) 0.9 % injection 3 mL (3 mLs Intravenous Given 11/11/22 1203)  acetaminophen (TYLENOL) tablet 650 mg (has no administration in time range)    Or  acetaminophen (TYLENOL) suppository 650 mg (has no administration in time range)  albuterol (PROVENTIL) (2.5 MG/3ML) 0.083% nebulizer solution 2.5 mg (has no administration in time range)  trimethobenzamide (TIGAN) injection 200 mg (has no administration in time range)  0.9 % NaCl with KCl 40 mEq / L  infusion ( Intravenous New Bag/Given 11/11/22 1234)  LORazepam (ATIVAN) tablet 1-4 mg (has no administration in time range)    Or  LORazepam (ATIVAN) injection 1-4 mg (has no administration in time range)  thiamine (VITAMIN B1) tablet 100 mg (100 mg Oral Given 11/11/22 1202)    Or  thiamine (VITAMIN B1) injection  100 mg ( Intravenous See Alternative 11/11/22 1202)  folic acid (FOLVITE) tablet 1 mg (1 mg Oral Given 11/11/22 1202)  multivitamin with minerals tablet 1 tablet (1 tablet Oral Given 11/11/22 1203)  LORazepam (ATIVAN) injection 0-4 mg (1 mg Intravenous Given 11/11/22 1208)    Followed by  LORazepam (ATIVAN) injection 0-4 mg (has no administration in time range)  calcium gluconate 2 g/ 100 mL sodium chloride IVPB (2,000 mg Intravenous New Bag/Given 11/11/22 1232)  potassium chloride SA (KLOR-CON M) CR tablet 40 mEq (40 mEq Oral Given 11/11/22 0956)   potassium chloride 10 mEq in 100 mL IVPB (0 mEq Intravenous Stopped 11/11/22 1207)  calcium gluconate 1 g/ 50 mL sodium chloride IVPB (0 mg Intravenous Stopped 11/11/22 1055)  sodium chloride 0.9 % bolus 1,000 mL (1,000 mLs Intravenous New Bag/Given 11/11/22 1056)  LORazepam (ATIVAN) injection 0.5 mg (0.5 mg Intravenous Given 11/11/22 1208)    Mobility Walks but too weak here to walk had to be pulled over to get on to stretcher     Focused Assessments Cardiac Assessment Handoff:  Cardiac Rhythm: Normal sinus rhythm Lab Results  Component Value Date   CKTOTAL 29,701 (H) 11/11/2022     , Neuro Assessment Handoff:  Cardiac Rhythm: Normal sinus rhythm NIH Stroke Scale  Dizziness Present: No Headache Present: No Interval: Initial Level of Consciousness (1a.)   : Alert, keenly responsive LOC Questions (1b. )   : Answers both questions correctly LOC Commands (1c. )   : Performs both tasks correctly Best Gaze (2. )  : Normal Visual (3. )  : No visual loss Facial Palsy (4. )    : Minor paralysis Motor Arm, Left (5a. )   : Some effort against gravity Motor Arm, Right (5b. ) : Drift Motor Leg, Left (6a. )  : No effort against gravity Motor Leg, Right (6b. ) : No drift Limb Ataxia (7. ): Present in one limb Sensory (8. )  : Normal, no sensory loss Best Language (9. )  : No aphasia Dysarthria (10. ): Normal Extinction/Inattention (11.)   : No Abnormality Complete NIHSS TOTAL: 8     Neuro Assessment: Exceptions to WDL Neuro Checks:   Initial (11/11/22 0754)   If patient is a Neuro Trauma and patient is going to OR before floor call report to 4N Charge nurse: [phone] or [phone]   R Recommendations: See Admitting Provider Note  Report given to:   Additional Notes:   CIWAs

## 2022-11-12 ENCOUNTER — Other Ambulatory Visit (HOSPITAL_COMMUNITY): Payer: Self-pay

## 2022-11-12 DIAGNOSIS — R748 Abnormal levels of other serum enzymes: Secondary | ICD-10-CM | POA: Diagnosis not present

## 2022-11-12 DIAGNOSIS — Z8249 Family history of ischemic heart disease and other diseases of the circulatory system: Secondary | ICD-10-CM | POA: Diagnosis not present

## 2022-11-12 DIAGNOSIS — F411 Generalized anxiety disorder: Secondary | ICD-10-CM | POA: Diagnosis present

## 2022-11-12 DIAGNOSIS — Z833 Family history of diabetes mellitus: Secondary | ICD-10-CM | POA: Diagnosis not present

## 2022-11-12 DIAGNOSIS — I445 Left posterior fascicular block: Secondary | ICD-10-CM | POA: Diagnosis present

## 2022-11-12 DIAGNOSIS — K219 Gastro-esophageal reflux disease without esophagitis: Secondary | ICD-10-CM | POA: Diagnosis present

## 2022-11-12 DIAGNOSIS — R7989 Other specified abnormal findings of blood chemistry: Secondary | ICD-10-CM | POA: Diagnosis present

## 2022-11-12 DIAGNOSIS — G610 Guillain-Barre syndrome: Secondary | ICD-10-CM

## 2022-11-12 DIAGNOSIS — F17290 Nicotine dependence, other tobacco product, uncomplicated: Secondary | ICD-10-CM | POA: Diagnosis present

## 2022-11-12 DIAGNOSIS — F1010 Alcohol abuse, uncomplicated: Secondary | ICD-10-CM | POA: Diagnosis present

## 2022-11-12 DIAGNOSIS — E876 Hypokalemia: Secondary | ICD-10-CM | POA: Diagnosis present

## 2022-11-12 DIAGNOSIS — Z79899 Other long term (current) drug therapy: Secondary | ICD-10-CM | POA: Diagnosis not present

## 2022-11-12 DIAGNOSIS — K7010 Alcoholic hepatitis without ascites: Secondary | ICD-10-CM

## 2022-11-12 DIAGNOSIS — I10 Essential (primary) hypertension: Secondary | ICD-10-CM | POA: Diagnosis present

## 2022-11-12 DIAGNOSIS — R531 Weakness: Secondary | ICD-10-CM | POA: Diagnosis present

## 2022-11-12 DIAGNOSIS — R5381 Other malaise: Secondary | ICD-10-CM | POA: Diagnosis present

## 2022-11-12 DIAGNOSIS — Z91148 Patient's other noncompliance with medication regimen for other reason: Secondary | ICD-10-CM | POA: Diagnosis not present

## 2022-11-12 DIAGNOSIS — L600 Ingrowing nail: Secondary | ICD-10-CM | POA: Diagnosis present

## 2022-11-12 DIAGNOSIS — B20 Human immunodeficiency virus [HIV] disease: Secondary | ICD-10-CM | POA: Diagnosis not present

## 2022-11-12 DIAGNOSIS — M6282 Rhabdomyolysis: Secondary | ICD-10-CM | POA: Diagnosis present

## 2022-11-12 DIAGNOSIS — F17210 Nicotine dependence, cigarettes, uncomplicated: Secondary | ICD-10-CM | POA: Diagnosis present

## 2022-11-12 DIAGNOSIS — D638 Anemia in other chronic diseases classified elsewhere: Secondary | ICD-10-CM | POA: Diagnosis present

## 2022-11-12 DIAGNOSIS — Z21 Asymptomatic human immunodeficiency virus [HIV] infection status: Secondary | ICD-10-CM | POA: Diagnosis present

## 2022-11-12 LAB — COMPREHENSIVE METABOLIC PANEL
ALT: 115 U/L — ABNORMAL HIGH (ref 0–44)
AST: 427 U/L — ABNORMAL HIGH (ref 15–41)
Albumin: 2 g/dL — ABNORMAL LOW (ref 3.5–5.0)
Alkaline Phosphatase: 148 U/L — ABNORMAL HIGH (ref 38–126)
Anion gap: 12 (ref 5–15)
BUN: <5 mg/dL — ABNORMAL LOW (ref 6–20)
CO2: 43 mmol/L — ABNORMAL HIGH (ref 22–32)
Calcium: 6.5 mg/dL — ABNORMAL LOW (ref 8.9–10.3)
Chloride: 86 mmol/L — ABNORMAL LOW (ref 98–111)
Creatinine, Ser: 0.8 mg/dL (ref 0.61–1.24)
GFR, Estimated: >60 mL/min (ref >60)
Glucose, Bld: 99 mg/dL (ref 70–99)
Potassium: <2 mmol/L — CL (ref 3.5–5.1)
Sodium: 141 mmol/L (ref 135–145)
Total Bilirubin: 3.3 mg/dL — ABNORMAL HIGH (ref 0.3–1.2)
Total Protein: 5.3 g/dL — ABNORMAL LOW (ref 6.5–8.1)

## 2022-11-12 LAB — MAGNESIUM
Magnesium: 1.4 mg/dL — ABNORMAL LOW (ref 1.7–2.4)
Magnesium: 1.8 mg/dL (ref 1.7–2.4)
Magnesium: 1.6 mg/dL — ABNORMAL LOW (ref 1.7–2.4)

## 2022-11-12 LAB — CK: Total CK: 25674 U/L — ABNORMAL HIGH (ref 49–397)

## 2022-11-12 LAB — PHOSPHORUS
Phosphorus: 3.5 mg/dL (ref 2.5–4.6)
Phosphorus: 2.5 mg/dL (ref 2.5–4.6)
Phosphorus: 3.6 mg/dL (ref 2.5–4.6)

## 2022-11-12 LAB — BASIC METABOLIC PANEL
Anion gap: 13 (ref 5–15)
Anion gap: 13 (ref 5–15)
BUN: <5 mg/dL — ABNORMAL LOW (ref 6–20)
BUN: <5 mg/dL — ABNORMAL LOW (ref 6–20)
CO2: 42 mmol/L — ABNORMAL HIGH (ref 22–32)
CO2: 41 mmol/L — ABNORMAL HIGH (ref 22–32)
Calcium: 6.7 mg/dL — ABNORMAL LOW (ref 8.9–10.3)
Calcium: 7 mg/dL — ABNORMAL LOW (ref 8.9–10.3)
Chloride: 87 mmol/L — ABNORMAL LOW (ref 98–111)
Chloride: 89 mmol/L — ABNORMAL LOW (ref 98–111)
Creatinine, Ser: 0.73 mg/dL (ref 0.61–1.24)
Creatinine, Ser: 0.66 mg/dL (ref 0.61–1.24)
GFR, Estimated: >60 mL/min (ref >60)
GFR, Estimated: >60 mL/min (ref >60)
Glucose, Bld: 132 mg/dL — ABNORMAL HIGH (ref 70–99)
Glucose, Bld: 105 mg/dL — ABNORMAL HIGH (ref 70–99)
Potassium: <2 mmol/L — CL (ref 3.5–5.1)
Potassium: 2.3 mmol/L — CL (ref 3.5–5.1)
Sodium: 142 mmol/L (ref 135–145)
Sodium: 143 mmol/L (ref 135–145)

## 2022-11-12 LAB — CD4/CD8 (T-HELPER/T-SUPPRESSOR CELL)
CD4 absolute: 228 /uL — ABNORMAL LOW (ref 400–1790)
CD4%: 18.94 % — ABNORMAL LOW (ref 33–65)
CD8 T Cell Abs: 752 /uL (ref 190–1000)
CD8tox: 62.36 % — ABNORMAL HIGH (ref 12–40)
Ratio: 0.3 — ABNORMAL LOW (ref 1.0–3.0)
Total lymphocyte count: 1205 /uL (ref 1000–4000)

## 2022-11-12 LAB — CBC
HCT: 25.6 % — ABNORMAL LOW (ref 39.0–52.0)
Hemoglobin: 9.2 g/dL — ABNORMAL LOW (ref 13.0–17.0)
MCH: 33.7 pg (ref 26.0–34.0)
MCHC: 35.9 g/dL (ref 30.0–36.0)
MCV: 93.8 fL (ref 80.0–100.0)
Platelets: 310 10*3/uL (ref 150–400)
RBC: 2.73 MIL/uL — ABNORMAL LOW (ref 4.22–5.81)
RDW: 17.3 % — ABNORMAL HIGH (ref 11.5–15.5)
WBC: 8.4 10*3/uL (ref 4.0–10.5)
nRBC: 0 % (ref 0.0–0.2)

## 2022-11-12 LAB — NA AND K (SODIUM & POTASSIUM), RAND UR
Potassium Urine: 7 mmol/L
Sodium, Ur: 105 mmol/L

## 2022-11-12 LAB — CHLORIDE, URINE, RANDOM: Chloride Urine: 112 mmol/L

## 2022-11-12 LAB — LACTIC ACID, PLASMA
Lactic Acid, Venous: 2.2 mmol/L (ref 0.5–1.9)
Lactic Acid, Venous: 2.1 mmol/L (ref 0.5–1.9)

## 2022-11-12 MED ORDER — K PHOS MONO-SOD PHOS DI & MONO 155-852-130 MG PO TABS
500.0000 mg | ORAL_TABLET | Freq: Three times a day (TID) | ORAL | Status: AC
  Administered 2022-11-12 – 2022-11-13 (×3): 500 mg via ORAL
  Filled 2022-11-12 (×3): qty 2

## 2022-11-12 MED ORDER — POTASSIUM CHLORIDE 10 MEQ/100ML IV SOLN
10.0000 meq | INTRAVENOUS | Status: AC
  Administered 2022-11-12 (×3): 10 meq via INTRAVENOUS
  Filled 2022-11-12 (×3): qty 100

## 2022-11-12 MED ORDER — AMILORIDE HCL 5 MG PO TABS
5.0000 mg | ORAL_TABLET | Freq: Every day | ORAL | Status: DC
  Administered 2022-11-12 – 2022-11-18 (×6): 5 mg via ORAL
  Filled 2022-11-12 (×7): qty 1

## 2022-11-12 MED ORDER — POTASSIUM CHLORIDE CRYS ER 20 MEQ PO TBCR
60.0000 meq | EXTENDED_RELEASE_TABLET | ORAL | Status: AC
  Administered 2022-11-12 – 2022-11-13 (×6): 60 meq via ORAL
  Filled 2022-11-12 (×5): qty 3

## 2022-11-12 MED ORDER — POTASSIUM CHLORIDE CRYS ER 20 MEQ PO TBCR
40.0000 meq | EXTENDED_RELEASE_TABLET | ORAL | Status: AC
  Administered 2022-11-12 (×3): 40 meq via ORAL
  Filled 2022-11-12 (×3): qty 2

## 2022-11-12 MED ORDER — MAGNESIUM SULFATE 4 GM/100ML IV SOLN
4.0000 g | Freq: Once | INTRAVENOUS | Status: AC
  Administered 2022-11-12: 4 g via INTRAVENOUS
  Filled 2022-11-12: qty 100

## 2022-11-12 MED ORDER — BICTEGRAVIR-EMTRICITAB-TENOFOV 50-200-25 MG PO TABS
1.0000 | ORAL_TABLET | Freq: Every day | ORAL | 0 refills | Status: AC
Start: 2022-11-12 — End: 2022-12-12
  Filled 2022-11-12: qty 30, 30d supply, fill #0

## 2022-11-12 MED ORDER — POTASSIUM CHLORIDE CRYS ER 20 MEQ PO TBCR
60.0000 meq | EXTENDED_RELEASE_TABLET | ORAL | Status: DC
  Filled 2022-11-12: qty 3

## 2022-11-12 MED ORDER — LOPERAMIDE HCL 2 MG PO CAPS
4.0000 mg | ORAL_CAPSULE | Freq: Four times a day (QID) | ORAL | Status: DC | PRN
  Administered 2022-11-12: 4 mg via ORAL
  Filled 2022-11-12: qty 2

## 2022-11-12 MED ORDER — MAGNESIUM SULFATE IN D5W 1-5 GM/100ML-% IV SOLN
1.0000 g | Freq: Once | INTRAVENOUS | Status: AC
  Administered 2022-11-12: 1 g via INTRAVENOUS
  Filled 2022-11-12: qty 100

## 2022-11-12 NOTE — Progress Notes (Signed)
PROGRESS NOTE    Harold Flores  ZOX:[id number] DOB: Aug 16, 1990 DOA: 11/11/2022 PCP: Corwin Levins, MD    Chief Complaint  Patient presents with   Fatigue   Weakness    Brief Narrative:   Harold Flores is a 32 y.o. male with medical history significant of hypertension, HIV, syphilis, genital herpes, Katheran Awe syndrome in 2022, anxiety, and alcohol abuse who presents with progressive weakness over the last 3 weeks.  Symptoms initially started with weakness in his neck that progressed down to his shoulders and then hands.  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  Associated symptoms include diarrhea for which he has been having at least 3 bowel movements per day and has had 2 episodes of vomiting.  Diarrhea and emesis are both reported to be nonbloody in appearance.  Denies any recent falls(last fall reported over a month ago) patient report weakness symptoms progressed and started affecting his legs as well which she became more concerned.  Patient does admit that he has not been consistently taking his antiviral medicines.  He does also admit to drinking alcohol heavily drinking 10 "bootlegers" with 12% alcohol per day on average.  His last drink was yesterday.  He also made note of an ingrown toenail for which a family member tried to remove, but has not completely healed since.  Records note similar hospitalization 2023 due to electrolyte derangements.   In the emergency department patient was noted to be afebrile with tachycardia and all other vitals relatively maintained.  Labs significant for WBC 6.6, hemoglobin 11.1, potassium less than 2, chloride 75, CO2 44, BUN less than 5, creatinine 0.8, calcium 6.1, anion gap 18, alkaline phosphatase 148, AST 434, ALT 125, and total bilirubin 3.5.  Chest x-ray showed no acute abnormality. Urinalysis was positive for moderate hemoglobin, rare bacteria, 0-5 RBC/hpf, and 0-5 WBCs.  Patient has been given normal saline 1 L IV, potassium chloride 40 meq p.o., potassium chloride 30 mEq IV Ativan 0.5 mg, and calcium gluconate 1 g IV.  Case had been discussed with neurology who felt symptoms were more likely secondary to patient's electrolytes derangements than Katheran Awe syndrome.   Assessment & Plan:   Principal Problem:   Weakness Active Problems:   Rhabdomyolysis   Hypokalemia   Hypomagnesemia   Hypocalcemia   Elevated troponin   ETOH abuse   HIV (human immunodeficiency virus infection) (HCC)   Ingrown toenail   Elevated liver enzymes     Weakness Nontraumatic rhabdomyolysis Acute.  Patient presents with complaints of progressive weakness of the bilateral upper extremities more so than the lower extremities.  Denies any recent falls or injury to onset symptoms.  CK  29,701.  Urinalysis with moderate hemoglobin, but 0-5 RBC/hpf consistent with rhabdomyolysis. -Consult PT/OT. -IV fluids. -Ordered severe electrolyte derangement. -I will check urine drug screen   Severe hypokalemia -This is most likely due to all abuse, with severe nutritional status and diarrhea-continue with replenishment, despite receiving multiple doses he remains<2, will continue with aggressive replacement and close monitoring, will recheck his BMP at 10 PM.  Hypocalcemia Hypomagnesemia -Continue to replete and monitor closely    Elevated troponin Acute.  High-sensitivity troponin 30-> 25 and noted to be trending down.  EKG without significant ischemic changes.  Patient denied any complaints of chest pain. -Continue to monitor   Alcohol abuse  Patient admits to drinking 10 bootlegger bottles per day on average -CIWA  protocols with scheduled and as needed Ativan -Thiamine, folic acid, MVI -Continue to counsel need of cessation of alcohol use.   Normocytic anemia Hemoglobin 11.1 g/dL which appears improved from prior.  No reports of bleeding in emesis or diarrhea. -Trending  down to 9.2 today, no evidence of significant bleed, this is most likely in the setting of dilution given aggressive IV hydration.   HIV/AIDS Patient reports intermittent compliance with medications.  Last CD4 count was 226 in 03/2022. Stressed importance of taking medications on a consistent basis.  Patient missed last appointment with infectious disease on 05/06/2022. -Continue Biktarvy -ID input greatly appreciated   Ingrown toenail Patient with ingrown toenail of the left great toe.  No significant erythema appreciated on physical exam. -May warrant referral to podiatry in the outpatient setting   Elevated liver enzymes Acute.  On admission alkaline phosphatase 148, AST 434, and ALT 125.  Thought secondary to patient's alcohol abuse and/or rhabdomyolysis. -Check PT/INR(to further evaluate for alcohol hepatitis and need of IV steroids) and hepatitis panel    DVT prophylaxis: Lovenox Code Status: Full Family Communication: none at bedside Disposition:   Status is: Inpatient    Consultants:  ID   Subjective:  Reporting diffuse diarrhea, no nausea, no vomiting, good oral intake.  Objective: Vitals:   11/11/22 2336 11/12/22 0305 11/12/22 1100 11/12/22 1205  BP: 121/80 (!) 134/91  (!) 136/92  Pulse: (!) 101 (!) 103 (!) 108   Resp: 20 18  14   Temp: 98.2 F (36.8 C) 98.3 F (36.8 C)  98.5 F (36.9 C)  TempSrc: Oral Oral  Oral  SpO2: 95% 94%    Weight:      Height:        Intake/Output Summary (Last 24 hours) at 11/12/2022 1315 Last data filed at 11/12/2022 0740 Gross per 24 hour  Intake 1276.47 ml  Output 2750 ml  Net -1473.53 ml   Filed Weights   11/11/22 0746  Weight: 79.4 kg    Examination:  Awake Alert, Oriented X 3, No new F.N deficits, Normal affect Symmetrical Chest wall movement, Good air movement bilaterally, CTAB RRR,No Gallops,Rubs or new Murmurs, No Parasternal Heave +ve B.Sounds, Abd Soft, No tenderness, No rebound - guarding or rigidity. No  Cyanosis, Clubbing or edema, No new Rash or bruise     Data Reviewed: I have personally reviewed following labs and imaging studies  CBC: Recent Labs  Lab 11/11/22 0804 11/12/22 0209  WBC 6.6 8.4  HGB 11.1* 9.2*  HCT 30.8* 25.6*  MCV 92.5 93.8  PLT 349 310    Basic Metabolic Panel: Recent Labs  Lab 11/11/22 0804 11/11/22 1140 11/11/22 2052 11/12/22 0209 11/12/22 0544  NA 137  --  140 141  --   K <2.0*  --  <2.0* <2.0*  --   CL 75*  --  84* 86*  --   CO2 44*  --  42* 43*  --   GLUCOSE 111*  --  124* 99  --   BUN <5*  --  <5* <5*  --   CREATININE 0.80  --  0.71 0.80  --   CALCIUM 6.1*  --  6.6* 6.5*  --   MG  --  0.9* 1.8 1.4*  --   PHOS  --  4.5  --   --  3.5    GFR: Estimated Creatinine Clearance: 138.1 mL/min (by C-G formula based on SCr of 0.8 mg/dL).  Liver Function Tests: Recent Labs  Lab 11/11/22 0804 11/12/22  0209  AST 434* 427*  ALT 125* 115*  ALKPHOS 148* 148*  BILITOT 3.5* 3.3*  PROT 6.4* 5.3*  ALBUMIN 2.4* 2.0*    CBG: Recent Labs  Lab 11/11/22 0817  GLUCAP 129*     Recent Results (from the past 240 hour(s))  C Difficile Quick Screen w PCR reflex     Status: None   Collection Time: 11/11/22 10:18 PM   Specimen: STOOL  Result Value Ref Range Status   C Diff antigen NEGATIVE NEGATIVE Final   C Diff toxin NEGATIVE NEGATIVE Final   C Diff interpretation No C. difficile detected.  Final    Comment: Performed at [facility], [address], [place], [place] [address]         Radiology Studies: US Abdomen Limited RUQ (LIVER/GB)  Result Date: 11/11/2022 CLINICAL DATA:  Transaminitis EXAM: ULTRASOUND ABDOMEN LIMITED RIGHT UPPER QUADRANT COMPARISON:  None available FINDINGS: Gallbladder: Gallstones: None Sludge: None Gallbladder Wall: Within normal limits Pericholecystic fluid: None Sonographic Murphy's Sign: Negative per technologist Common bile duct: Diameter: 4 mm Liver: Parenchymal echogenicity: Diffusely increased Contours:  Normal Lesions: None Portal vein: Patent.  Hepatopetal flow Other: None. IMPRESSION: Diffuse increased echogenicity of the hepatic parenchyma is a nonspecific indicator of hepatocellular dysfunction, most commonly steatosis. Electronically Signed   By: Acquanetta Belling M.D.   On: 11/11/2022 11:38   DG Chest Port 1 View  Result Date: 11/11/2022 CLINICAL DATA:  Weakness EXAM: PORTABLE CHEST 1 VIEW COMPARISON:  03/26/2022 FINDINGS: The heart size and mediastinal contours are within normal limits. Both lungs are clear. The visualized skeletal structures are unremarkable. IMPRESSION: No active disease. Electronically Signed   By: Judie Petit.  Shick M.D.   On: 11/11/2022 08:36        Scheduled Meds:  aMILoride  5 mg Oral Daily   bictegravir-emtricitabine-tenofovir AF  1 tablet Oral Daily   enoxaparin (LOVENOX) injection  40 mg Subcutaneous Q24H   folic acid  1 mg Oral Daily   LORazepam  0-4 mg Intravenous Q6H   Followed by   Melene Muller ON 11/13/2022] LORazepam  0-4 mg Intravenous Q12H   magnesium oxide  400 mg Oral BID   multivitamin with minerals  1 tablet Oral Daily   sodium chloride flush  3 mL Intravenous Q12H   thiamine  100 mg Oral Daily   Or   thiamine  100 mg Intravenous Daily   Continuous Infusions:  0.9 % NaCl with KCl 40 mEq / L 125 mL/hr at 11/11/22 2050   potassium chloride 10 mEq (11/12/22 1123)     LOS: 0 days       Huey Bienenstock, MD [facility]   To contact the attending provider between 7A-7P or the covering provider during after hours 7P-7A, please log into the web site [email or web address] and access using universal [place] password for that web site. If you do not have the password, please call the hospital operator.  11/12/2022, 1:15 PM

## 2022-11-12 NOTE — Consult Note (Signed)
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 Syphilis  Plan:  Check a viral load and CD4 count as well as syphilis test Restarting his Biktarvy and will provide with 30 days via TOC All other issues per primary He DESPERATELY needs to get into a treatment program before he ends up with [facility]SL[facility]  Principal Problem:   Weakness Active Problems:   HIV (human immunodeficiency virus infection) (HCC)   Hypokalemia   Hypomagnesemia   Elevated troponin   ETOH abuse   Rhabdomyolysis   Hypocalcemia   Ingrown toenail   Elevated liver enzymes   Scheduled Meds:  aMILoride  5 mg Oral Daily   bictegravir-emtricitabine-tenofovir AF  1 tablet Oral Daily   enoxaparin (LOVENOX) injection  40 mg Subcutaneous Q24H   folic acid  1 mg Oral Daily   LORazepam  0-4 mg Intravenous Q6H   Followed by   Melene Muller ON 11/13/2022] LORazepam  0-4 mg Intravenous Q12H   magnesium oxide  400 mg Oral BID   multivitamin with minerals  1 tablet Oral Daily   sodium chloride flush  3 mL Intravenous Q12H   thiamine  100 mg Oral Daily   Or   thiamine  100 mg Intravenous Daily   Continuous Infusions:  0.9 % NaCl with KCl 40 mEq / L 125 mL/hr at 11/11/22 2050   potassium chloride 10 mEq (11/12/22 1123)   PRN Meds:.acetaminophen **OR** acetaminophen, albuterol, LORazepam **OR** LORazepam, trimethobenzamide  HPI: Harold Flores is a 32 y.o. male past medical history significant for HIV/AIDS Guillain-Barr and heavy alcohol abuse.  He is now admitted in the context of rhabdomyolysis with severe electrolyte abnormalities in context of ongoing heavy alcohol abuse.  He admits to [place] that he takes his BIKTARVY  "here and there."  He is currently receiving IVF, and having electrolytes replenished.  I have personally spent 80 minutes involved in face-to-face and non-face-to-face activities for this patient on the day of the visit. Professional time spent includes the following activities: Preparing to see the patient (review of tests), Obtaining and/or reviewing separately obtained history (admission/discharge record), Performing a medically appropriate examination and/or evaluation , Ordering medications/tests/procedures, referring and communicating with other health care professionals, Documenting clinical information in the EMR, Independently interpreting results (not separately reported), Communicating results to the patient/family/caregiver, Counseling and educating the patient/family/caregiver and Care coordination (not separately reported).     Review of Systems: Review of Systems  Constitutional:  Positive for malaise/fatigue. Negative for chills, diaphoresis, fever and weight loss.  HENT:  Negative for congestion, hearing loss, sore throat and tinnitus.   Eyes:  Negative for blurred vision and double vision.  Respiratory:  Negative for cough, sputum production, shortness of breath and wheezing.   Cardiovascular:  Negative for chest pain, palpitations and leg swelling.  Gastrointestinal:  Negative for abdominal pain, blood in stool, constipation, diarrhea, heartburn, melena, nausea and vomiting.  Genitourinary:  Negative for dysuria, flank pain and hematuria.  Musculoskeletal:  Positive for back pain, joint pain and myalgias. Negative for falls.  Skin:  Negative for itching and rash.  Neurological:  Positive for focal weakness and weakness. Negative for dizziness, sensory change, loss of consciousness  and headaches.  Endo/Heme/Allergies:  Does not bruise/bleed easily.  Psychiatric/Behavioral:  Positive for depression and substance abuse. Negative for memory loss and suicidal ideas. The patient is  not nervous/anxious.     Past Medical History:  Diagnosis Date   Anal fissure    Anal fistula    Anal ulcer    Asthma    Epistaxis    GAD (generalized anxiety disorder)    GERD (gastroesophageal reflux disease)    Hemorrhoid    HIV (human immunodeficiency virus infection) (HCC)    Hypertension    Syphilis    Thrush 03/25/2022   Varicella     Social History   Tobacco Use   Smoking status: Every Day    Packs/day: 0.50    Years: 5.00    Additional pack years: 0.00    Total pack years: 2.50    Types: E-cigarettes, Cigarettes   Smokeless tobacco: Never  Substance Use Topics   Alcohol use: Yes    Alcohol/week: 7.0 standard drinks of alcohol    Types: 7 Cans of beer per week    Comment: daily drinker, bootleggers (beer) x 6-10/ day   Drug use: Yes    Types: Marijuana    Comment: occ    Family History  Problem Relation Age of Onset   Diabetes Mother    Hyperlipidemia Mother    Diabetes Maternal Grandmother    Heart disease Paternal Grandfather    Cancer Neg Hx    No Known Allergies  OBJECTIVE: Blood pressure (!) 136/92, pulse (!) 108, temperature 98.5 F (36.9 C), temperature source Oral, resp. rate 14, height 5\' 10"  (1.778 m), weight 79.4 kg, SpO2 94 %.  Physical Exam Constitutional:      Appearance: He is well-developed.  HENT:     Head: Normocephalic and atraumatic.  Eyes:     Conjunctiva/sclera: Conjunctivae normal.  Cardiovascular:     Rate and Rhythm: Normal rate and regular rhythm.  Pulmonary:     Effort: Pulmonary effort is normal. No respiratory distress.     Breath sounds: No wheezing.  Abdominal:     General: There is no distension.     Palpations: Abdomen is soft.  Musculoskeletal:        General: No tenderness. Normal range of motion.     Cervical back: Normal range of motion and neck supple.  Skin:    General: Skin is warm and dry.     Coloration: Skin is not pale.     Findings: No erythema or rash.  Neurological:     General: No  focal deficit present.     Mental Status: He is alert and oriented to person, place, and time.  Psychiatric:        Mood and Affect: Mood normal.        Behavior: Behavior normal.        Thought Content: Thought content normal.        Judgment: Judgment normal.     Lab Results Lab Results  Component Value Date   WBC 8.4 11/12/2022   HGB 9.2 (L) 11/12/2022   HCT 25.6 (L) 11/12/2022   MCV 93.8 11/12/2022   PLT 310 11/12/2022    Lab Results  Component Value Date   CREATININE 0.80 11/12/2022   BUN <5 (L) 11/12/2022   NA 141 11/12/2022   K <2.0 (LL) 11/12/2022   CL 86 (L) 11/12/2022   CO2 43 (H) 11/12/2022    Lab Results  Component Value Date   ALT  115 (H) 11/12/2022   AST 427 (H) 11/12/2022   ALKPHOS 148 (H) 11/12/2022   BILITOT 3.3 (H) 11/12/2022     Microbiology: Recent Results (from the past 240 hour(s))  C Difficile Quick Screen w PCR reflex     Status: None   Collection Time: 11/11/22 10:18 PM   Specimen: STOOL  Result Value Ref Range Status   C Diff antigen NEGATIVE NEGATIVE Final   C Diff toxin NEGATIVE NEGATIVE Final   C Diff interpretation No C. difficile detected.  Final    Comment: Performed at [facility], [address], [place], [place] [address]    Acey Lav, MD [facility] [phone] pager  11/12/2022, 12:33 PM

## 2022-11-12 NOTE — TOC Benefit Eligibility Note (Signed)
Patient Product/process development scientist completed.    The patient is currently admitted and upon discharge could be taking Biktarvy 50-200-25 mg tablets.  The current 30 day co-pay is $0.00.   The patient is insured through [facility] [place]   This test claim was processed through [facility]- copay amounts may vary at other pharmacies due to pharmacy/plan contracts, or as the patient moves through the different stages of their insurance plan.  Roland Earl, CPHT Pharmacy Patient Advocate Specialist [facility] Patient Advocate Team Direct Number: [phone]  Fax: [phone]

## 2022-11-13 DIAGNOSIS — E876 Hypokalemia: Secondary | ICD-10-CM | POA: Diagnosis not present

## 2022-11-13 DIAGNOSIS — R748 Abnormal levels of other serum enzymes: Secondary | ICD-10-CM | POA: Diagnosis not present

## 2022-11-13 DIAGNOSIS — R531 Weakness: Secondary | ICD-10-CM | POA: Diagnosis not present

## 2022-11-13 LAB — RPR: RPR Ser Ql: NONREACTIVE

## 2022-11-13 LAB — URINE DRUGS OF ABUSE SCREEN W ALC, ROUTINE (REF LAB)
Amphetamines, Urine: NEGATIVE ng/mL
Barbiturate, Ur: NEGATIVE ng/mL
Benzodiazepine Quant, Ur: NEGATIVE ng/mL
Cannabinoid Quant, Ur: NEGATIVE ng/mL
Cocaine (Metab.): NEGATIVE ng/mL
Ethanol U, Quan: NEGATIVE %
Methadone Screen, Urine: NEGATIVE ng/mL
Opiate Quant, Ur: NEGATIVE ng/mL
Phencyclidine, Ur: NEGATIVE ng/mL
Propoxyphene, Urine: NEGATIVE ng/mL

## 2022-11-13 LAB — COMPREHENSIVE METABOLIC PANEL
ALT: 118 U/L — ABNORMAL HIGH (ref 0–44)
AST: 454 U/L — ABNORMAL HIGH (ref 15–41)
Albumin: 2 g/dL — ABNORMAL LOW (ref 3.5–5.0)
Alkaline Phosphatase: 135 U/L — ABNORMAL HIGH (ref 38–126)
Anion gap: 13 (ref 5–15)
BUN: <5 mg/dL — ABNORMAL LOW (ref 6–20)
CO2: 38 mmol/L — ABNORMAL HIGH (ref 22–32)
Calcium: 6.7 mg/dL — ABNORMAL LOW (ref 8.9–10.3)
Chloride: 90 mmol/L — ABNORMAL LOW (ref 98–111)
Creatinine, Ser: 0.65 mg/dL (ref 0.61–1.24)
GFR, Estimated: >60 mL/min (ref >60)
Glucose, Bld: 98 mg/dL (ref 70–99)
Potassium: 2.2 mmol/L — CL (ref 3.5–5.1)
Sodium: 141 mmol/L (ref 135–145)
Total Bilirubin: 2 mg/dL — ABNORMAL HIGH (ref 0.3–1.2)
Total Protein: 5.4 g/dL — ABNORMAL LOW (ref 6.5–8.1)

## 2022-11-13 LAB — BASIC METABOLIC PANEL
Anion gap: 9 (ref 5–15)
BUN: <5 mg/dL — ABNORMAL LOW (ref 6–20)
CO2: 33 mmol/L — ABNORMAL HIGH (ref 22–32)
Calcium: 7.1 mg/dL — ABNORMAL LOW (ref 8.9–10.3)
Chloride: 96 mmol/L — ABNORMAL LOW (ref 98–111)
Creatinine, Ser: 0.81 mg/dL (ref 0.61–1.24)
GFR, Estimated: >60 mL/min (ref >60)
Glucose, Bld: 127 mg/dL — ABNORMAL HIGH (ref 70–99)
Potassium: 4 mmol/L (ref 3.5–5.1)
Sodium: 138 mmol/L (ref 135–145)

## 2022-11-13 LAB — HEPATITIS A ANTIBODY, TOTAL: hep A Total Ab: NONREACTIVE

## 2022-11-13 LAB — CK: Total CK: 25161 U/L — ABNORMAL HIGH (ref 49–397)

## 2022-11-13 LAB — HIV-1 RNA QUANT-NO REFLEX-BLD
HIV 1 RNA Quant: <20 copies/mL
LOG10 HIV-1 RNA: UNDETERMINED log10copy/mL

## 2022-11-13 LAB — MAGNESIUM, URINE: Magnesium, urine: 6.6 mg/dL

## 2022-11-13 LAB — RAPID URINE DRUG SCREEN, HOSP PERFORMED
Amphetamines: NOT DETECTED
Barbiturates: NOT DETECTED
Benzodiazepines: POSITIVE — AB
Cocaine: NOT DETECTED
Opiates: NOT DETECTED
Tetrahydrocannabinol: NOT DETECTED

## 2022-11-13 LAB — MAGNESIUM: Magnesium: 1.9 mg/dL (ref 1.7–2.4)

## 2022-11-13 MED ORDER — MAGNESIUM SULFATE 2 GM/50ML IV SOLN
2.0000 g | Freq: Once | INTRAVENOUS | Status: AC
  Administered 2022-11-13: 2 g via INTRAVENOUS
  Filled 2022-11-13: qty 50

## 2022-11-13 MED ORDER — PANTOPRAZOLE SODIUM 40 MG PO TBEC
40.0000 mg | DELAYED_RELEASE_TABLET | Freq: Every day | ORAL | Status: DC
  Administered 2022-11-14 – 2022-11-16 (×3): 40 mg via ORAL
  Filled 2022-11-13 (×3): qty 1

## 2022-11-13 MED ORDER — POTASSIUM CHLORIDE 10 MEQ/100ML IV SOLN
10.0000 meq | INTRAVENOUS | Status: AC
  Administered 2022-11-13 (×4): 10 meq via INTRAVENOUS
  Filled 2022-11-13: qty 100

## 2022-11-13 MED ORDER — POTASSIUM CHLORIDE CRYS ER 20 MEQ PO TBCR
40.0000 meq | EXTENDED_RELEASE_TABLET | Freq: Once | ORAL | Status: AC
  Administered 2022-11-13: 40 meq via ORAL
  Filled 2022-11-13: qty 2

## 2022-11-13 MED ORDER — AMLODIPINE BESYLATE 5 MG PO TABS
5.0000 mg | ORAL_TABLET | Freq: Every day | ORAL | Status: DC
  Administered 2022-11-14 – 2022-11-18 (×5): 5 mg via ORAL
  Filled 2022-11-13 (×6): qty 1

## 2022-11-13 NOTE — Plan of Care (Signed)

## 2022-11-13 NOTE — Progress Notes (Signed)
PROGRESS NOTE        PATIENT DETAILS Name: Harold Flores Age: 32 y.o. Sex: male Date of Birth: [birth year] Admit Date: 11/11/2022 Admitting Physician Clydie Braun, MD ZOX:[id number], Len Blalock, MD  Brief Summary: Patient is a 32 y.o.  male with history of HIV, HTN, alcohol use-presented with fatigue/weakness-found to have rhabdomyolysis, severe hypokalemia-subsequently admitted to the hospitalist service.  Significant events: 6/3>> admit to [facility]  Significant studies: 6/3>> CXR: No PNA 6/3>>RUQ ultrasound: Increased echogenicity of hepatic parenchyma.  Significant microbiology data: 6/3>> stool for C. difficile toxin/antigen: Negative  Procedures: None  Consults: ID  Subjective: Lying comfortably in bed-denies any chest pain or shortness of breath.  Feels better-claims weakness is better by 30-40%.  Objective: Vitals: Blood pressure (!) 144/100, pulse (!) 118, temperature 98.5 F (36.9 C), temperature source Oral, resp. rate (!) 26, height 5\' 10"  (1.778 m), weight 79.4 kg, SpO2 96 %.   Exam: Gen Exam:Alert awake-not in any distress HEENT:atraumatic, normocephalic Chest: B/L clear to auscultation anteriorly CVS:S1S2 regular Abdomen:soft non tender, non distended Extremities:no edema Neurology: Non focal Skin: no rash  Pertinent Labs/Radiology:    Latest Ref Rng & Units 11/12/2022    2:09 AM 11/11/2022    8:04 AM 04/04/2022    9:24 AM  CBC  WBC 4.0 - 10.5 K/uL 8.4  6.6  7.2   Hemoglobin 13.0 - 17.0 g/dL 9.2  45.4  8.8   Hematocrit 39.0 - 52.0 % 25.6  30.8  27.6   Platelets 150 - 400 K/uL 310  349  545     Lab Results  Component Value Date   NA 141 11/13/2022   K 2.2 (LL) 11/13/2022   CL 90 (L) 11/13/2022   CO2 38 (H) 11/13/2022     Assessment/Plan: Nontraumatic rhabdomyolysis CK downtrending-continue IVF and trend CK  Profound hypokalemia Continue aggressive supplementation with both IV and oral KCl Continue amiloride Recheck  electrolytes later this evening  Hypomagnesemia Replete/recheck  Transaminitis Likely due to rhabdomyolysis/alcohol hepatitis Trend LFTs  EtOH use Last drink on 6/3 per patient report No evidence of withdrawal Ativan per CIWA protocol  HIV/AIDS Poor compliance with antiretrovirals ID following-now on Biktarvy  Normocytic anemia Secondary to chronic disease Follow CBC periodically  Ingrowing left toenail-great toe No significant tenderness/erythema on exam Podiatry referral in the outpatient setting  History of Guillain-Barr syndrome 2022 Debility/deconditioning due to rhabdomyolysis/profound electrolyte abnormalities Mobilize with PT/OT.  BMI: Estimated body mass index is 25.11 kg/m as calculated from the following:   Height as of this encounter: 5\' 10"  (1.778 m).   Weight as of this encounter: 79.4 kg.   Code status:   Code Status: Full Code   DVT Prophylaxis: enoxaparin (LOVENOX) injection 40 mg Start: 11/11/22 1200   Family Communication: None at bedside  Disposition Plan: Status is: Inpatient Remains inpatient appropriate because: Severity of illness   Planned Discharge Destination:Home   Diet: Diet Order             Diet Heart Room service appropriate? Yes; Fluid consistency: Thin  Diet effective now                     Antimicrobial agents: Anti-infectives (From admission, onward)    Start     Dose/Rate Route Frequency Ordered Stop   11/12/22 0000  bictegravir-emtricitabine-tenofovir AF (BIKTARVY) 50-200-25 MG TABS tablet  1 tablet Oral Daily 11/12/22 0942 12/12/22 2359   11/11/22 2100  bictegravir-emtricitabine-tenofovir AF (BIKTARVY) 50-200-25 MG per tablet 1 tablet        1 tablet Oral Daily 11/11/22 2009          MEDICATIONS: Scheduled Meds:  aMILoride  5 mg Oral Daily   bictegravir-emtricitabine-tenofovir AF  1 tablet Oral Daily   enoxaparin (LOVENOX) injection  40 mg Subcutaneous Q24H   folic acid  1 mg Oral Daily    LORazepam  0-4 mg Intravenous Q12H   multivitamin with minerals  1 tablet Oral Daily   potassium chloride  40 mEq Oral Once   sodium chloride flush  3 mL Intravenous Q12H   thiamine  100 mg Oral Daily   Or   thiamine  100 mg Intravenous Daily   Continuous Infusions:  0.9 % NaCl with KCl 40 mEq / L 150 mL/hr at 11/13/22 0757   potassium chloride 10 mEq (11/13/22 1348)   PRN Meds:.acetaminophen **OR** acetaminophen, albuterol, loperamide, LORazepam **OR** LORazepam, trimethobenzamide   I have personally reviewed following labs and imaging studies  LABORATORY DATA: CBC: Recent Labs  Lab 11/11/22 0804 11/12/22 0209  WBC 6.6 8.4  HGB 11.1* 9.2*  HCT 30.8* 25.6*  MCV 92.5 93.8  PLT 349 310    Basic Metabolic Panel: Recent Labs  Lab 11/11/22 1140 11/11/22 2052 11/12/22 0209 11/12/22 0544 11/12/22 1243 11/12/22 2133 11/13/22 0559  NA  --  140 141  --  142 143 141  K  --  <2.0* <2.0*  --  <2.0* 2.3* 2.2*  CL  --  84* 86*  --  87* 89* 90*  CO2  --  42* 43*  --  42* 41* 38*  GLUCOSE  --  124* 99  --  132* 105* 98  BUN  --  <5* <5*  --  <5* <5* <5*  CREATININE  --  0.71 0.80  --  0.73 0.66 0.65  CALCIUM  --  6.6* 6.5*  --  6.7* 7.0* 6.7*  MG 0.9* 1.8 1.4*  --  1.8 1.6* 1.9  PHOS 4.5  --   --  3.5 2.5 3.6  --     GFR: Estimated Creatinine Clearance: 138.1 mL/min (by C-G formula based on SCr of 0.65 mg/dL).  Liver Function Tests: Recent Labs  Lab 11/11/22 0804 11/12/22 0209 11/13/22 0559  AST 434* 427* 454*  ALT 125* 115* 118*  ALKPHOS 148* 148* 135*  BILITOT 3.5* 3.3* 2.0*  PROT 6.4* 5.3* 5.4*  ALBUMIN 2.4* 2.0* 2.0*   No results for input(s): "LIPASE", "AMYLASE" in the last 168 hours. No results for input(s): "AMMONIA" in the last 168 hours.  Coagulation Profile: Recent Labs  Lab 11/11/22 2052  INR 1.2    Cardiac Enzymes: Recent Labs  Lab 11/11/22 0804 11/12/22 0209 11/13/22 0559  CKTOTAL 29,701* 25,674* 25,161*    BNP (last 3 results) No  results for input(s): "PROBNP" in the last 8760 hours.  Lipid Profile: No results for input(s): "CHOL", "HDL", "LDLCALC", "TRIG", "CHOLHDL", "LDLDIRECT" in the last 72 hours.  Thyroid Function Tests: No results for input(s): "TSH", "T4TOTAL", "FREET4", "T3FREE", "THYROIDAB" in the last 72 hours.  Anemia Panel: No results for input(s): "VITAMINB12", "FOLATE", "FERRITIN", "TIBC", "IRON", "RETICCTPCT" in the last 72 hours.  Urine analysis:    Component Value Date/Time   COLORURINE YELLOW 11/11/2022 1206   APPEARANCEUR CLEAR 11/11/2022 1206   LABSPEC 1.006 11/11/2022 1206   PHURINE 7.0 11/11/2022 1206   GLUCOSEU  NEGATIVE 11/11/2022 1206   GLUCOSEU NEGATIVE 11/26/2021 1040   HGBUR MODERATE (A) 11/11/2022 1206   BILIRUBINUR NEGATIVE 11/11/2022 1206   KETONESUR NEGATIVE 11/11/2022 1206   PROTEINUR 30 (A) 11/11/2022 1206   UROBILINOGEN 2.0 (A) 11/26/2021 1040   NITRITE NEGATIVE 11/11/2022 1206   LEUKOCYTESUR NEGATIVE 11/11/2022 1206    Sepsis Labs: Lactic Acid, Venous    Component Value Date/Time   LATICACIDVEN 2.1 (HH) 11/12/2022 0903    MICROBIOLOGY: Recent Results (from the past 240 hour(s))  C Difficile Quick Screen w PCR reflex     Status: None   Collection Time: 11/11/22 10:18 PM   Specimen: STOOL  Result Value Ref Range Status   C Diff antigen NEGATIVE NEGATIVE Final   C Diff toxin NEGATIVE NEGATIVE Final   C Diff interpretation No C. difficile detected.  Final    Comment: Performed at [facility], [address] [place], [place] [address]    RADIOLOGY STUDIES/RESULTS: No results found.   LOS: 1 day   Jeoffrey Massed, MD  [facility]    To contact the attending provider between 7A-7P or the covering provider during after hours 7P-7A, please log into the web site [email or web address] and access using universal [place] password for that web site. If you do not have the password, please call the hospital operator.  11/13/2022, 1:57 PM

## 2022-11-13 NOTE — Evaluation (Addendum)
Occupational Therapy Evaluation Patient Details Name: Harold Flores MRN: [id number] DOB: 1990-06-27 Today's Date: 11/13/2022   History of Present Illness 32 y.o. male presents to [facility] hospital on 11/11/2022 with progressive weakness, vomiting and diarrhea over the last 3 weeks. Pt also reports heavy alcohol consumption. Pt admitted for management of nontraumatic rhabdomyolysis and electrolyte derangement. PMH includes asthma, epistaxis, GAD, GERD, HIV, HTN, syphilis.   Clinical Impression   Pt evaluated s/p above admission list. Pt reports independence with ADLs and functional mobility at baseline. Pt presenting this session with BUE generalized weakness and decreased BUE ROM limited to approx 90 degrees shoulder flexion. Pt educated on use of long-handled comb and sponge for modified independence with grooming/bathing tasks, pt verbalized understanding. Pt completed functional transfers and room level mobility without use of AD with mod I for increased time. Pt would benefit from acute OT services for AE training to increase independence with ADLs. Pt does not require follow up OT services upon discharge.      Recommendations for follow up therapy are one component of a multi-disciplinary discharge planning process, led by the attending physician.  Recommendations may be updated based on patient status, additional functional criteria and insurance authorization.   Assistance Recommended at Discharge None  Patient can return home with the following      Functional Status Assessment  Patient has had a recent decline in their functional status and demonstrates the ability to make significant improvements in function in a reasonable and predictable amount of time.  Equipment Recommendations  None recommended by OT    Recommendations for Other Services       Precautions / Restrictions Precautions Precautions: None Precaution Comments: enteric precautions Restrictions Weight Bearing  Restrictions: No      Mobility Bed Mobility Overal bed mobility: Modified Independent             General bed mobility comments: HOB elevated, use of bed rail    Transfers Overall transfer level: Independent Equipment used: None               General transfer comment: from EOB, room level mobility      Balance Overall balance assessment: Independent                                         ADL either performed or assessed with clinical judgement   ADL Overall ADL's : Modified independent                                       General ADL Comments: Pt doffed/donned B socks at EOB via figure four position with increased time, pt washed hands/face at sink independently; may benefit from long-handled comb/sponge for grooming secondary to decreased BUE shoulder flexion     Vision Baseline Vision/History: 0 No visual deficits Ability to See in Adequate Light: 0 Adequate Vision Assessment?: No apparent visual deficits     Perception Perception Perception Tested?: No   Praxis Praxis Praxis tested?: Not tested    Pertinent Vitals/Pain Pain Assessment Pain Assessment: No/denies pain     Hand Dominance Right   Extremity/Trunk Assessment Upper Extremity Assessment Upper Extremity Assessment: RUE deficits/detail;LUE deficits/detail RUE Deficits / Details: overall 4/5 strenth,  AROM shoulder flexion approx 90 degrees LUE Deficits / Details: overall 4/5 strength, AROM shoulder  flexion approx 90 degrees   Lower Extremity Assessment Lower Extremity Assessment: Defer to PT evaluation   Cervical / Trunk Assessment Cervical / Trunk Assessment: Normal   Communication Communication Communication: No difficulties   Cognition Arousal/Alertness: Awake/alert Behavior During Therapy: WFL for tasks assessed/performed Overall Cognitive Status: Within Functional Limits for tasks assessed                                  General Comments: Pleasant and cooperative     General Comments  HR 120s with rest and activity    Exercises     Shoulder Instructions      Home Living Family/patient expects to be discharged to:: Private residence Living Arrangements: Parent;Other relatives Available Help at Discharge: Family;Available 24 hours/day Type of Home: House Home Access: Ramped entrance     Home Layout: One level     Bathroom Shower/Tub: Chief Strategy Officer: Standard     Home Equipment: Agricultural consultant (2 wheels);Cane - single point;Shower seat          Prior Functioning/Environment Prior Level of Function : Independent/Modified Independent;Working/employed;Driving             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 ADLs Comments: Reports independence with ADL/IADLs, driving and working        OT Problem List: Decreased range of motion;Decreased strength      OT Treatment/Interventions: Self-care/ADL training;Therapeutic activities;Patient/family education;DME and/or AE instruction;Energy conservation    OT Goals(Current goals can be found in the care plan section) Acute Rehab OT Goals Patient Stated Goal: to get stronger OT Goal Formulation: With patient Time For Goal Achievement: 11/27/22 Potential to Achieve Goals: Good ADL Goals Additional ADL Goal #1: Pt will participate in adaptive equipment education to increase independence with ADLs.  OT Frequency: Min 2X/week    Co-evaluation              AM-PAC OT "6 Clicks" Daily Activity     Outcome Measure Help from another person eating meals?: None Help from another person taking care of personal grooming?: None Help from another person toileting, which includes using toliet, bedpan, or urinal?: None Help from another person bathing (including washing, rinsing, drying)?: None Help from another person to put on and taking off regular upper body clothing?: None Help from another person to put on and  taking off regular lower body clothing?: None 6 Click Score: 24   End of Session Equipment Utilized During Treatment: Other (comment) (none) Nurse Communication: Mobility status  Activity Tolerance: Patient tolerated treatment well Patient left: in bed;with call bell/phone within reach  OT Visit Diagnosis: Muscle weakness (generalized) (M62.81)                Time: 9147-8295 OT Time Calculation (min): 21 min Charges:  OT General Charges $OT Visit: 1 Visit OT Evaluation $OT Eval Low Complexity: 1 Low  Sherley Bounds, OTS Acute Rehabilitation Services Office [phone] Secure Chat Communication Preferred   Sherley Bounds 11/13/2022, 5:04 PM

## 2022-11-13 NOTE — Evaluation (Signed)
Physical Therapy Evaluation Patient Details Name: Harold Flores MRN: [id number] DOB: 07-13-1990 Today's Date: 11/13/2022  History of Present Illness  32 y.o. male presents to [facility] hospital on 11/11/2022 with progressive weakness, vomiting and diarrhea over the last 3 weeks. Pt also reports heavy alcohol consumption. Pt admitted for management of nontraumatic rhabdomyolysis and electrolyte derangement. PMH includes asthma, epistaxis, GAD, GERD, HIV, HTN, syphilis.  Clinical Impression  Pt presents to PT with mild deficits in strength, but remains able to mobilize independently at this time. Pt reports improvement in strength since arrival to hospital and denies concerns about his mobility at this time. PT encourages frequent ambulation daily while admitted in an effort to continue improving endurance and strength. Pt requires no further PT services at this time. PT recommends discharge home when medically appropriate.       Recommendations for follow up therapy are one component of a multi-disciplinary discharge planning process, led by the attending physician.  Recommendations may be updated based on patient status, additional functional criteria and insurance authorization.  Follow Up Recommendations       Assistance Recommended at Discharge PRN  Patient can return home with the following       Equipment Recommendations None recommended by PT  Recommendations for Other Services       Functional Status Assessment Patient has had a recent decline in their functional status and demonstrates the ability to make significant improvements in function in a reasonable and predictable amount of time.     Precautions / Restrictions Precautions Precautions: None Restrictions Weight Bearing Restrictions: No      Mobility  Bed Mobility                    Transfers Overall transfer level: Independent Equipment used: None                    Ambulation/Gait Ambulation/Gait  assistance: Modified independent (Device/Increase time) Gait Distance (Feet): 300 Feet Assistive device: None Gait Pattern/deviations: WFL(Within Functional Limits) Gait velocity: functional household Gait velocity interpretation: 1.31 - 2.62 ft/sec, indicative of limited community ambulator   General Gait Details: slowed step-through gait  Stairs            Wheelchair Mobility    Modified Rankin (Stroke Patients Only)       Balance Overall balance assessment: Independent                                           Pertinent Vitals/Pain Pain Assessment Pain Assessment: No/denies pain    Home Living Family/patient expects to be discharged to:: Private residence Living Arrangements: Parent;Other relatives Available Help at Discharge: Family;Available 24 hours/day Type of Home: House Home Access: Ramped entrance       Home Layout: One level Home Equipment: Agricultural consultant (2 wheels);Cane - single point      Prior Function Prior Level of Function : Independent/Modified Independent;Working/employed;Driving             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       [employer]        Extremity/Trunk Assessment   Upper Extremity Assessment Upper Extremity Assessment: Overall WFL for tasks assessed    Lower Extremity Assessment Lower Extremity Assessment: Generalized weakness    Cervical / Trunk Assessment Cervical / Trunk Assessment: Normal  Communication   Communication: No difficulties  Cognition Arousal/Alertness: Awake/alert Behavior During Therapy: WFL for tasks assessed/performed Overall Cognitive Status: Within Functional Limits for tasks assessed                                          General Comments General comments (skin integrity, edema, etc.): tachy into 120s at rest, HR steady during mobility    Exercises     Assessment/Plan    PT Assessment Patient does not need any further PT  services  PT Problem List         PT Treatment Interventions      PT Goals (Current goals can be found in the Care Plan section)       Frequency       Co-evaluation               AM-PAC PT "6 Clicks" Mobility  Outcome Measure Help needed turning from your back to your side while in a flat bed without using bedrails?: None Help needed moving from lying on your back to sitting on the side of a flat bed without using bedrails?: None Help needed moving to and from a bed to a chair (including a wheelchair)?: None Help needed standing up from a chair using your arms (e.g., wheelchair or bedside chair)?: None Help needed to walk in hospital room?: None Help needed climbing 3-5 steps with a railing? : None 6 Click Score: 24    End of Session   Activity Tolerance: Patient tolerated treatment well Patient left: in bed;with call bell/phone within reach Nurse Communication: Mobility status PT Visit Diagnosis: Muscle weakness (generalized) (M62.81)    Time: 1610-9604 PT Time Calculation (min) (ACUTE ONLY): 12 min   Charges:   PT Evaluation $PT Eval Low Complexity: 1 Low          Arlyss Gandy, PT, DPT Acute Rehabilitation Office [phone]   Arlyss Gandy 11/13/2022, 3:10 PM

## 2022-11-13 NOTE — Progress Notes (Signed)
Subjective: No new complaints   Antibiotics:  Anti-infectives (From admission, onward)    Start     Dose/Rate Route Frequency Ordered Stop   11/12/22 0000  bictegravir-emtricitabine-tenofovir AF (BIKTARVY) 50-200-25 MG TABS tablet        1 tablet Oral Daily 11/12/22 0942 12/12/22 2359   11/11/22 2100  bictegravir-emtricitabine-tenofovir AF (BIKTARVY) 50-200-25 MG per tablet 1 tablet        1 tablet Oral Daily 11/11/22 2009         Medications: Scheduled Meds:  aMILoride  5 mg Oral Daily   bictegravir-emtricitabine-tenofovir AF  1 tablet Oral Daily   enoxaparin (LOVENOX) injection  40 mg Subcutaneous Q24H   folic acid  1 mg Oral Daily   LORazepam  0-4 mg Intravenous Q12H   multivitamin with minerals  1 tablet Oral Daily   potassium chloride  40 mEq Oral Once   potassium chloride  60 mEq Oral Q4H   sodium chloride flush  3 mL Intravenous Q12H   thiamine  100 mg Oral Daily   Or   thiamine  100 mg Intravenous Daily   Continuous Infusions:  0.9 % NaCl with KCl 40 mEq / L 150 mL/hr at 11/13/22 0757   potassium chloride 10 mEq (11/13/22 1115)   PRN Meds:.acetaminophen **OR** acetaminophen, albuterol, loperamide, LORazepam **OR** LORazepam, trimethobenzamide    Objective: Weight change:   Intake/Output Summary (Last 24 hours) at 11/13/2022 1219 Last data filed at 11/13/2022 0741 Gross per 24 hour  Intake 2951.67 ml  Output 2400 ml  Net 551.67 ml   Blood pressure (!) 141/108, pulse (!) 118, temperature 98.5 F (36.9 C), temperature source Oral, resp. rate (!) 26, height 5\' 10"  (1.778 m), weight 79.4 kg, SpO2 96 %. Temp:  [98 F (36.7 C)-98.5 F (36.9 C)] 98.5 F (36.9 C) (06/04 2300) Pulse Rate:  [107-118] 118 (06/05 0800) Resp:  [17-26] 26 (06/05 0800) BP: (136-141)/(92-108) 141/108 (06/05 0800) SpO2:  [96 %-97 %] 96 % (06/05 0800)  Physical Exam: Physical Exam Constitutional:      Appearance: He is well-developed.  HENT:     Head: Normocephalic and  atraumatic.  Eyes:     Conjunctiva/sclera: Conjunctivae normal.  Cardiovascular:     Rate and Rhythm: Normal rate and regular rhythm.  Pulmonary:     Effort: Pulmonary effort is normal. No respiratory distress.     Breath sounds: No wheezing.  Abdominal:     General: There is no distension.     Palpations: Abdomen is soft.  Musculoskeletal:        General: Normal range of motion.     Cervical back: Normal range of motion and neck supple.  Skin:    General: Skin is warm and dry.     Findings: No erythema or rash.  Neurological:     General: No focal deficit present.     Mental Status: He is alert and oriented to person, place, and time.  Psychiatric:        Mood and Affect: Mood normal.        Behavior: Behavior normal.        Thought Content: Thought content normal.        Judgment: Judgment normal.      CBC:    BMET Recent Labs    11/12/22 2133 11/13/22 0559  NA 143 141  K 2.3* 2.2*  CL 89* 90*  CO2 41* 38*  GLUCOSE 105* 98  BUN <5* <5*  CREATININE 0.66 0.65  CALCIUM 7.0* 6.7*     Liver Panel  Recent Labs    11/12/22 0209 11/13/22 0559  PROT 5.3* 5.4*  ALBUMIN 2.0* 2.0*  AST 427* 454*  ALT 115* 118*  ALKPHOS 148* 135*  BILITOT 3.3* 2.0*       Sedimentation Rate No results for input(s): "ESRSEDRATE" in the last 72 hours. C-Reactive Protein No results for input(s): "CRP" in the last 72 hours.  Micro Results: Recent Results (from the past 720 hour(s))  C Difficile Quick Screen w PCR reflex     Status: None   Collection Time: 11/11/22 10:18 PM   Specimen: STOOL  Result Value Ref Range Status   C Diff antigen NEGATIVE NEGATIVE Final   C Diff toxin NEGATIVE NEGATIVE Final   C Diff interpretation No C. difficile detected.  Final    Comment: Performed at [facility], [address] [place], [place] [address]    Studies/Results: No results found.    Assessment/Plan:  INTERVAL HISTORY: K improving   Principal Problem:    Weakness Active Problems:   HIV (human immunodeficiency virus infection) (HCC)   Hypokalemia   Hypomagnesemia   Elevated troponin   ETOH abuse   Rhabdomyolysis   Hypocalcemia   Ingrown toenail   Elevated liver enzymes    Harold Flores is a 32 y.o. male with past medical history significant for HIV/AIDS Guillain-Barr and heavy alcohol abuse now it would with rhabdomyolysis alcoholic hepatitis and severe hypokalemia hypomagnesemia.  #1 HIV CD4 is still above 200  Continue Biktarvy and he has 30 days of meds at discharge  #2 Alcoholic hepatitis: Have again counseled him that he is going to develop severe liver failure that would need transplantation if he continues down this path.  He really desperately needs to be in treatment and needs to be engaged with AA  #3 Ommaya lysis with multiple severe electrolyte abnormalities: Improving with fluids and replenishment of electrolytes.  I have personally spent 52 minutes involved in face-to-face and non-face-to-face activities for this patient on the day of the visit. Professional time spent includes the following activities: Preparing to see the patient (review of tests), Obtaining and/or reviewing separately obtained history (admission/discharge record), Performing a medically appropriate examination and/or evaluation , Ordering medications/tests/procedures, referring and communicating with other health care professionals, Documenting clinical information in the EMR, Independently interpreting results (not separately reported), Communicating results to the patient/family/caregiver, Counseling and educating the patient/family/caregiver and Care coordination (not separately reported).    Harold Flores has an appointment on 12/04/2022 at 3PM with Rexene Alberts, NP at  [facility], which  is located in the [facility] at  [address] [place] in [place].  [address], which is  located to the left of the elevators.  Phone: [phone]  Fax: [phone]  [email or web address]  The patient should arrive 30 minutes prior to their appoitment.  I will sign off for now please call with further questions.   LOS: 1 day   Acey Lav 11/13/2022, 12:19 PM

## 2022-11-14 DIAGNOSIS — E876 Hypokalemia: Secondary | ICD-10-CM | POA: Diagnosis not present

## 2022-11-14 DIAGNOSIS — R748 Abnormal levels of other serum enzymes: Secondary | ICD-10-CM | POA: Diagnosis not present

## 2022-11-14 DIAGNOSIS — R531 Weakness: Secondary | ICD-10-CM | POA: Diagnosis not present

## 2022-11-14 LAB — COMPREHENSIVE METABOLIC PANEL
ALT: 176 U/L — ABNORMAL HIGH (ref 0–44)
AST: 888 U/L — ABNORMAL HIGH (ref 15–41)
Albumin: 2.1 g/dL — ABNORMAL LOW (ref 3.5–5.0)
Alkaline Phosphatase: 165 U/L — ABNORMAL HIGH (ref 38–126)
Anion gap: 11 (ref 5–15)
BUN: <5 mg/dL — ABNORMAL LOW (ref 6–20)
CO2: 28 mmol/L (ref 22–32)
Calcium: 7.1 mg/dL — ABNORMAL LOW (ref 8.9–10.3)
Chloride: 94 mmol/L — ABNORMAL LOW (ref 98–111)
Creatinine, Ser: 0.73 mg/dL (ref 0.61–1.24)
GFR, Estimated: >60 mL/min (ref >60)
Glucose, Bld: 95 mg/dL (ref 70–99)
Potassium: 5 mmol/L (ref 3.5–5.1)
Sodium: 133 mmol/L — ABNORMAL LOW (ref 135–145)
Total Bilirubin: 1.5 mg/dL — ABNORMAL HIGH (ref 0.3–1.2)
Total Protein: 5.7 g/dL — ABNORMAL LOW (ref 6.5–8.1)

## 2022-11-14 LAB — MAGNESIUM: Magnesium: 1.2 mg/dL — ABNORMAL LOW (ref 1.7–2.4)

## 2022-11-14 LAB — CK
Total CK: >50000 U/L — ABNORMAL HIGH (ref 49–397)
Total CK: 48081 U/L — ABNORMAL HIGH (ref 49–397)

## 2022-11-14 LAB — PHOSPHORUS: Phosphorus: 3.4 mg/dL (ref 2.5–4.6)

## 2022-11-14 MED ORDER — LIDOCAINE 5 % EX PTCH
1.0000 | MEDICATED_PATCH | CUTANEOUS | Status: AC
  Administered 2022-11-14: 1 via TRANSDERMAL
  Filled 2022-11-14: qty 1

## 2022-11-14 MED ORDER — CARVEDILOL 6.25 MG PO TABS
6.2500 mg | ORAL_TABLET | Freq: Two times a day (BID) | ORAL | Status: DC
  Administered 2022-11-14 – 2022-11-18 (×9): 6.25 mg via ORAL
  Filled 2022-11-14 (×9): qty 1

## 2022-11-14 MED ORDER — MAGNESIUM SULFATE 4 GM/100ML IV SOLN
4.0000 g | Freq: Once | INTRAVENOUS | Status: AC
  Administered 2022-11-14: 4 g via INTRAVENOUS
  Filled 2022-11-14: qty 100

## 2022-11-14 MED ORDER — LORAZEPAM 0.5 MG PO TABS
0.5000 mg | ORAL_TABLET | Freq: Two times a day (BID) | ORAL | Status: DC | PRN
  Administered 2022-11-14: 0.5 mg via ORAL
  Filled 2022-11-14: qty 1

## 2022-11-14 MED ORDER — SODIUM CHLORIDE 0.9 % IV SOLN: INTRAVENOUS | Status: DC

## 2022-11-14 NOTE — Progress Notes (Signed)
PROGRESS NOTE        PATIENT DETAILS Name: Harold Flores Age: 32 y.o. Sex: male Date of Birth: [birth year] Admit Date: 11/11/2022 Admitting Physician Clydie Braun, MD ZOX:WRUE, Len Blalock, MD  Brief Summary: Patient is a 32 y.o.  male with history of HIV, HTN, alcohol use-presented with fatigue/weakness-found to have rhabdomyolysis, severe hypokalemia-subsequently admitted to the hospitalist service.  Significant events: 6/3>> admit to [facility]  Significant studies: 6/3>> CXR: No PNA 6/3>>RUQ ultrasound: Increased echogenicity of hepatic parenchyma.  Significant microbiology data: 6/3>> stool for C. difficile toxin/antigen: Negative  Procedures: None  Consults: ID  Subjective: He feels much better-per nursing staff-ambulating in the room without any assistance.  Objective: Vitals: Blood pressure (!) 139/107, pulse (!) 110, temperature 99.1 F (37.3 C), temperature source Oral, resp. rate 20, height 5\' 10"  (1.778 m), weight 79.4 kg, SpO2 100 %.   Exam: Gen Exam:Alert awake-not in any distress HEENT:atraumatic, normocephalic Chest: B/L clear to auscultation anteriorly CVS:S1S2 regular Abdomen:soft non tender, non distended Extremities:no edema Neurology: Non focal Skin: no rash  Pertinent Labs/Radiology:    Latest Ref Rng & Units 11/12/2022    2:09 AM 11/11/2022    8:04 AM 04/04/2022    9:24 AM  CBC  WBC 4.0 - 10.5 K/uL 8.4  6.6  7.2   Hemoglobin 13.0 - 17.0 g/dL 9.2  45.4  8.8   Hematocrit 39.0 - 52.0 % 25.6  30.8  27.6   Platelets 150 - 400 K/uL 310  349  545     Lab Results  Component Value Date   NA 133 (L) 11/14/2022   K 5.0 11/14/2022   CL 94 (L) 11/14/2022   CO2 28 11/14/2022     Assessment/Plan: Nontraumatic rhabdomyolysis CK worsening today-unclear why Check phosphorus levels-to esnure no refeeding synd associated rhabdo Recheck UDS Increase IVF to 175 cc/hr   Severe hypokalemia In the setting of alcohol  use Potassium is finally normalized Continue amiloride.  Hypomagnesemia Continues to have fluctuating levels of magnesium-hypomagnesemia today-replete/recheck in AM.    Transaminitis Transaminases much worse today as rhabdomyolysis seems to have worsened overnight Hopefully his CK downtrends-these numbers will improve.  EtOH use Last drink on 6/3 per patient report No major withdrawal symptoms-no tremors-completely awake/alert. Ativan per CIWA protocol  HIV/AIDS Poor compliance with antiretrovirals ID following-now on Biktarvy  Normocytic anemia Secondary to chronic disease Follow CBC periodically  Ingrowing left toenail-great toe No significant tenderness/erythema on exam Podiatry referral in the outpatient setting  History of Guillain-Barr syndrome 2022 Debility/deconditioning due to rhabdomyolysis/profound electrolyte abnormalities Mobilize with PT/OT.  BMI: Estimated body mass index is 25.11 kg/m as calculated from the following:   Height as of this encounter: 5\' 10"  (1.778 m).   Weight as of this encounter: 79.4 kg.   Code status:   Code Status: Full Code   DVT Prophylaxis: enoxaparin (LOVENOX) injection 40 mg Start: 11/11/22 1200   Family Communication: None at bedside  Disposition Plan: Status is: Inpatient Remains inpatient appropriate because: Severity of illness   Planned Discharge Destination:Home   Diet: Diet Order             Diet Heart Room service appropriate? Yes; Fluid consistency: Thin  Diet effective now                     Antimicrobial agents: Anti-infectives (From admission,  onward)    Start     Dose/Rate Route Frequency Ordered Stop   11/12/22 0000  bictegravir-emtricitabine-tenofovir AF (BIKTARVY) 50-200-25 MG TABS tablet        1 tablet Oral Daily 11/12/22 0942 12/12/22 2359   11/11/22 2100  bictegravir-emtricitabine-tenofovir AF (BIKTARVY) 50-200-25 MG per tablet 1 tablet        1 tablet Oral Daily 11/11/22 2009           MEDICATIONS: Scheduled Meds:  aMILoride  5 mg Oral Daily   amLODipine  5 mg Oral Daily   bictegravir-emtricitabine-tenofovir AF  1 tablet Oral Daily   carvedilol  6.25 mg Oral BID WC   enoxaparin (LOVENOX) injection  40 mg Subcutaneous Q24H   folic acid  1 mg Oral Daily   lidocaine  1 patch Transdermal Q24H   LORazepam  0-4 mg Intravenous Q12H   multivitamin with minerals  1 tablet Oral Daily   pantoprazole  40 mg Oral Q1200   sodium chloride flush  3 mL Intravenous Q12H   thiamine  100 mg Oral Daily   Or   thiamine  100 mg Intravenous Daily   Continuous Infusions:  sodium chloride     magnesium sulfate bolus IVPB     PRN Meds:.acetaminophen **OR** acetaminophen, albuterol, loperamide, trimethobenzamide   I have personally reviewed following labs and imaging studies  LABORATORY DATA: CBC: Recent Labs  Lab 11/11/22 0804 11/12/22 0209  WBC 6.6 8.4  HGB 11.1* 9.2*  HCT 30.8* 25.6*  MCV 92.5 93.8  PLT 349 310     Basic Metabolic Panel: Recent Labs  Lab 11/11/22 1140 11/11/22 2052 11/12/22 0209 11/12/22 0544 11/12/22 1243 11/12/22 2133 11/13/22 0559 11/13/22 2104 11/14/22 0700  NA  --    < > 141  --  142 143 141 138 133*  K  --    < > <2.0*  --  <2.0* 2.3* 2.2* 4.0 5.0  CL  --    < > 86*  --  87* 89* 90* 96* 94*  CO2  --    < > 43*  --  42* 41* 38* 33* 28  GLUCOSE  --    < > 99  --  132* 105* 98 127* 95  BUN  --    < > <5*  --  <5* <5* <5* <5* <5*  CREATININE  --    < > 0.80  --  0.73 0.66 0.65 0.81 0.73  CALCIUM  --    < > 6.5*  --  6.7* 7.0* 6.7* 7.1* 7.1*  MG 0.9*   < > 1.4*  --  1.8 1.6* 1.9  --  1.2*  PHOS 4.5  --   --  3.5 2.5 3.6  --   --   --    < > = values in this interval not displayed.     GFR: Estimated Creatinine Clearance: 138.1 mL/min (by C-G formula based on SCr of 0.73 mg/dL).  Liver Function Tests: Recent Labs  Lab 11/11/22 0804 11/12/22 0209 11/13/22 0559 11/14/22 0700  AST 434* 427* 454* 888*  ALT 125* 115* 118*  176*  ALKPHOS 148* 148* 135* 165*  BILITOT 3.5* 3.3* 2.0* 1.5*  PROT 6.4* 5.3* 5.4* 5.7*  ALBUMIN 2.4* 2.0* 2.0* 2.1*    No results for input(s): "LIPASE", "AMYLASE" in the last 168 hours. No results for input(s): "AMMONIA" in the last 168 hours.  Coagulation Profile: Recent Labs  Lab 11/11/22 2052  INR 1.2  Cardiac Enzymes: Recent Labs  Lab 11/11/22 0804 11/12/22 0209 11/13/22 0559 11/14/22 0700  CKTOTAL 29,701* 25,674* 25,161* >50,000*     BNP (last 3 results) No results for input(s): "PROBNP" in the last 8760 hours.  Lipid Profile: No results for input(s): "CHOL", "HDL", "LDLCALC", "TRIG", "CHOLHDL", "LDLDIRECT" in the last 72 hours.  Thyroid Function Tests: No results for input(s): "TSH", "T4TOTAL", "FREET4", "T3FREE", "THYROIDAB" in the last 72 hours.  Anemia Panel: No results for input(s): "VITAMINB12", "FOLATE", "FERRITIN", "TIBC", "IRON", "RETICCTPCT" in the last 72 hours.  Urine analysis:    Component Value Date/Time   COLORURINE YELLOW 11/11/2022 1206   APPEARANCEUR CLEAR 11/11/2022 1206   LABSPEC 1.006 11/11/2022 1206   PHURINE 7.0 11/11/2022 1206   GLUCOSEU NEGATIVE 11/11/2022 1206   GLUCOSEU NEGATIVE 11/26/2021 1040   HGBUR MODERATE (A) 11/11/2022 1206   BILIRUBINUR NEGATIVE 11/11/2022 1206   KETONESUR NEGATIVE 11/11/2022 1206   PROTEINUR 30 (A) 11/11/2022 1206   UROBILINOGEN 2.0 (A) 11/26/2021 1040   NITRITE NEGATIVE 11/11/2022 1206   LEUKOCYTESUR NEGATIVE 11/11/2022 1206    Sepsis Labs: Lactic Acid, Venous    Component Value Date/Time   LATICACIDVEN 2.1 (HH) 11/12/2022 0903    MICROBIOLOGY: Recent Results (from the past 240 hour(s))  C Difficile Quick Screen w PCR reflex     Status: None   Collection Time: 11/11/22 10:18 PM   Specimen: STOOL  Result Value Ref Range Status   C Diff antigen NEGATIVE NEGATIVE Final   C Diff toxin NEGATIVE NEGATIVE Final   C Diff interpretation No C. difficile detected.  Final    Comment:  Performed at [facility], [address], [place], [place] [address]    RADIOLOGY STUDIES/RESULTS: No results found.   LOS: 2 days   Jeoffrey Massed, MD  [facility]    To contact the attending provider between 7A-7P or the covering provider during after hours 7P-7A, please log into the web site [email or web address] and access using universal [place] password for that web site. If you do not have the password, please call the hospital operator.  11/14/2022, 11:50 AM

## 2022-11-14 NOTE — Progress Notes (Signed)
OT Cancellation Note  Patient Details Name: Harold Flores MRN: [id number] DOB: Oct 10, 1990   Cancelled Treatment:    Reason Eval/Treat Not Completed: Medical issues which prohibited therapy (Per pt's RN, pt has had significant loose stools this day and is not appropriate for participation in skilled OT session at this time. OT will attempt to see pt for skilled OT session later this week as pt is appropriate and schedule allows.)  Harold Sarnowski "[name]" M., OTR/L, MA Acute Rehab [phone]   Lendon Colonel 11/14/2022, 2:09 PM

## 2022-11-15 DIAGNOSIS — R531 Weakness: Secondary | ICD-10-CM | POA: Diagnosis not present

## 2022-11-15 DIAGNOSIS — E876 Hypokalemia: Secondary | ICD-10-CM | POA: Diagnosis not present

## 2022-11-15 DIAGNOSIS — R748 Abnormal levels of other serum enzymes: Secondary | ICD-10-CM | POA: Diagnosis not present

## 2022-11-15 LAB — COMPREHENSIVE METABOLIC PANEL
ALT: 177 U/L — ABNORMAL HIGH (ref 0–44)
AST: 695 U/L — ABNORMAL HIGH (ref 15–41)
Albumin: 2.4 g/dL — ABNORMAL LOW (ref 3.5–5.0)
Alkaline Phosphatase: 140 U/L — ABNORMAL HIGH (ref 38–126)
Anion gap: 15 (ref 5–15)
BUN: 6 mg/dL (ref 6–20)
CO2: 21 mmol/L — ABNORMAL LOW (ref 22–32)
Calcium: 8.2 mg/dL — ABNORMAL LOW (ref 8.9–10.3)
Chloride: 101 mmol/L (ref 98–111)
Creatinine, Ser: 0.72 mg/dL (ref 0.61–1.24)
GFR, Estimated: >60 mL/min (ref >60)
Glucose, Bld: 102 mg/dL — ABNORMAL HIGH (ref 70–99)
Potassium: 5.1 mmol/L (ref 3.5–5.1)
Sodium: 137 mmol/L (ref 135–145)
Total Bilirubin: 1.3 mg/dL — ABNORMAL HIGH (ref 0.3–1.2)
Total Protein: 6.6 g/dL (ref 6.5–8.1)

## 2022-11-15 LAB — URINE DRUGS OF ABUSE SCREEN W ALC, ROUTINE (REF LAB)
Amphetamines, Urine: NEGATIVE ng/mL
Barbiturate, Ur: NEGATIVE ng/mL
Benzodiazepine Quant, Ur: NEGATIVE ng/mL
Cannabinoid Quant, Ur: NEGATIVE ng/mL
Cocaine (Metab.): NEGATIVE ng/mL
Ethanol U, Quan: NEGATIVE %
Methadone Screen, Urine: NEGATIVE ng/mL
Opiate Quant, Ur: NEGATIVE ng/mL
Phencyclidine, Ur: NEGATIVE ng/mL
Propoxyphene, Urine: NEGATIVE ng/mL

## 2022-11-15 LAB — PHOSPHORUS: Phosphorus: 4.9 mg/dL — ABNORMAL HIGH (ref 2.5–4.6)

## 2022-11-15 LAB — MAGNESIUM: Magnesium: 1.7 mg/dL (ref 1.7–2.4)

## 2022-11-15 LAB — CK: Total CK: 40981 U/L — ABNORMAL HIGH (ref 49–397)

## 2022-11-15 MED ORDER — MAGNESIUM SULFATE 2 GM/50ML IV SOLN
2.0000 g | Freq: Once | INTRAVENOUS | Status: AC
  Administered 2022-11-15: 2 g via INTRAVENOUS
  Filled 2022-11-15: qty 50

## 2022-11-15 MED ORDER — SODIUM CHLORIDE 0.9 % IV SOLN: INTRAVENOUS | Status: DC

## 2022-11-15 NOTE — Progress Notes (Signed)
Occupational Therapy Treatment and Discharge Patient Details Name: Harold Flores MRN: [id number] DOB: Nov 07, 1990 Today's Date: 11/15/2022   History of present illness 32 y.o. male presents to [facility] hospital on 11/11/2022 with progressive weakness, vomiting and diarrhea over the last 3 weeks. Pt also reports heavy alcohol consumption. Pt admitted for management of nontraumatic rhabdomyolysis and electrolyte derangement. PMH includes asthma, epistaxis, GAD, GERD, HIV, HTN, syphilis.   OT comments  Pt supine in bed with HOB elevated upon OT arrival. Pt agreeable to participation in skilled OT session. OT educated pt in use of AE and energy conservation techniques for increased safety and independence with ADLs and IADLs. Pt verbalized and demonstrated understanding of all education through teach back. Pt now demonstrates ability to complete all ADLs and functional transfers/mobility without an AD Independent to Mod I and has met all acute skilled OT goals. No additional acute skilled OT services are indicated at this time. No post acute skilled OT follow up is indicated at this time. OT is signing off.    Recommendations for follow up therapy are one component of a multi-disciplinary discharge planning process, led by the attending physician.  Recommendations may be updated based on patient status, additional functional criteria and insurance authorization.    Assistance Recommended at Discharge None  Patient can return home with the following  Other (comment) (PRN assistance with IADLs)   Equipment Recommendations  None recommended by OT    Recommendations for Other Services      Precautions / Restrictions Precautions Precautions: None Restrictions Weight Bearing Restrictions: No       Mobility Bed Mobility Overal bed mobility: Modified Independent             General bed mobility comments: HOB elevated, use of bed rail    Transfers                         Balance  Overall balance assessment: No apparent balance deficits (not formally assessed)                                         ADL either performed or assessed with clinical judgement   ADL Overall ADL's : Modified independent                                       General ADL Comments: OT educated pt in use of AE (long handled sponge, sock aide, reacher, long handled shoe horn) for increased independence and safety with ADLs. Pt verbalized and demonstrated understanding of all training through teach back, demonstrating ability to complete all tasks Mod I with AE. Pt states he thinks reacher and long handled sponge will be most beneficial for him and he will use these at home. OT provided reacher and long handled sponge.    Extremity/Trunk Assessment Upper Extremity Assessment Upper Extremity Assessment: Overall WFL for tasks assessed   Lower Extremity Assessment Lower Extremity Assessment: Defer to PT evaluation        Vision       Perception     Praxis      Cognition Arousal/Alertness: Awake/alert Behavior During Therapy: WFL for tasks assessed/performed Overall Cognitive Status: Within Functional Limits for tasks assessed  General Comments: Pleasant throughout session        Exercises      Shoulder Instructions       General Comments OT educated pt in energy conservation techniques to increase safety and independence with ADLs and IADLs with pt verbalizing understanding of training. VSS on RA throughout session. Pt has now met all OT goals with pt reporting no further concerns.    Pertinent Vitals/ Pain       Pain Assessment Pain Assessment: No/denies pain  Home Living                                          Prior Functioning/Environment              Frequency           Progress Toward Goals  OT Goals(current goals can now be found in the care plan  section)  Progress towards OT goals: Goals met/education completed, patient discharged from OT  Acute Rehab OT Goals Patient Stated Goal: To return home and prevent falls OT Goal Formulation: With patient  Plan Discharge plan remains appropriate    Co-evaluation                 AM-PAC OT "6 Clicks" Daily Activity     Outcome Measure   Help from another person eating meals?: None Help from another person taking care of personal grooming?: None Help from another person toileting, which includes using toliet, bedpan, or urinal?: None Help from another person bathing (including washing, rinsing, drying)?: None Help from another person to put on and taking off regular upper body clothing?: None Help from another person to put on and taking off regular lower body clothing?: None 6 Click Score: 24    End of Session Equipment Utilized During Treatment: Other (comment) (Long handled shoe horn, sock aide, long handled sponge, reacher)  OT Visit Diagnosis: Other (comment) (Decreased activity tolerance)   Activity Tolerance Patient tolerated treatment well   Patient Left in bed;with call bell/phone within reach   Nurse Communication Mobility status        Time: 0454-0981 OT Time Calculation (min): 32 min  Charges: OT General Charges $OT Visit: 1 Visit OT Treatments $Self Care/Home Management : 23-37 mins  [name] "Orson Eva., OTR/L, MA Acute Rehab [phone]   Lendon Colonel 11/15/2022, 4:39 PM

## 2022-11-15 NOTE — Progress Notes (Signed)
PROGRESS NOTE        PATIENT DETAILS Name: Harold Flores Age: 32 y.o. Sex: male Date of Birth: [birth year] Admit Date: 11/11/2022 Admitting Physician Clydie Braun, MD WUJ:WJXB, Len Blalock, MD  Brief Summary: Patient is a 32 y.o.  male with history of HIV, HTN, alcohol use-presented with fatigue/weakness-found to have rhabdomyolysis, severe hypokalemia-subsequently admitted to the hospitalist service.  Significant events: 6/3>> admit to [facility]  Significant studies: 6/3>> CXR: No PNA 6/3>>RUQ ultrasound: Increased echogenicity of hepatic parenchyma.  Significant microbiology data: 6/3>> stool for C. difficile toxin/antigen: Negative  Procedures: None  Consults: ID  Subjective: Feels much better-when I walked in the room-he had just gotten out of bed by himself-was on the bedside commode.  He has no complaints.  Denies any thigh pain.  Denies any muscle pain in other areas.  No nausea or vomiting.  Objective: Vitals: Blood pressure 111/76, pulse (!) 111, temperature 98.6 F (37 C), temperature source Oral, resp. rate 20, height 5\' 10"  (1.778 m), weight 79.4 kg, SpO2 97 %.   Exam: Gen Exam:Alert awake-not in any distress HEENT:atraumatic, normocephalic Chest: B/L clear to auscultation anteriorly CVS:S1S2 regular Abdomen:soft non tender, non distended Extremities:no edema Neurology: Non focal Skin: no rash  Pertinent Labs/Radiology:    Latest Ref Rng & Units 11/12/2022    2:09 AM 11/11/2022    8:04 AM 04/04/2022    9:24 AM  CBC  WBC 4.0 - 10.5 K/uL 8.4  6.6  7.2   Hemoglobin 13.0 - 17.0 g/dL 9.2  14.7  8.8   Hematocrit 39.0 - 52.0 % 25.6  30.8  27.6   Platelets 150 - 400 K/uL 310  349  545     Lab Results  Component Value Date   NA 137 11/15/2022   K 5.1 11/15/2022   CL 101 11/15/2022   CO2 21 (L) 11/15/2022     Assessment/Plan: Nontraumatic rhabdomyolysis Unclear why had significant jump in CK levels on 6/6.  Phosphorus levels were  normal.  No seizures.  UDS pending. Continue IV fluid hydration-CK levels now downtrending  Severe hypokalemia In the setting of alcohol use K has finally normalized with amiloride and multiple supplementation-watch closely as it is on the higher normal side.  May need to stop amiloride over the next several days. Continue amiloride  Hypomagnesemia Attempt to keep magnesium> 2-repeat today.  Transaminitis Secondary to rhabdomyolysis Downtrending Continue to follow.    EtOH use Last drink on 6/3 per patient report No major withdrawal symptoms-no tremors-completely awake/alert. Ativan per CIWA protocol  HIV/AIDS Poor compliance with antiretrovirals ID following-now on Biktarvy  Normocytic anemia Secondary to chronic disease Follow CBC periodically  Ingrowing left toenail-great toe No significant tenderness/erythema on exam Podiatry referral in the outpatient setting  History of Guillain-Barr syndrome 2022 Debility/deconditioning due to rhabdomyolysis/profound electrolyte abnormalities Mobilize with PT/OT.  BMI: Estimated body mass index is 25.11 kg/m as calculated from the following:   Height as of this encounter: 5\' 10"  (1.778 m).   Weight as of this encounter: 79.4 kg.   Code status:   Code Status: Full Code   DVT Prophylaxis: enoxaparin (LOVENOX) injection 40 mg Start: 11/11/22 1200   Family Communication: None at bedside  Disposition Plan: Status is: Inpatient Remains inpatient appropriate because: Severity of illness   Planned Discharge Destination:Home   Diet: Diet Order  Diet Heart Room service appropriate? Yes; Fluid consistency: Thin  Diet effective now                     Antimicrobial agents: Anti-infectives (From admission, onward)    Start     Dose/Rate Route Frequency Ordered Stop   11/12/22 0000  bictegravir-emtricitabine-tenofovir AF (BIKTARVY) 50-200-25 MG TABS tablet        1 tablet Oral Daily 11/12/22 0942  12/12/22 2359   11/11/22 2100  bictegravir-emtricitabine-tenofovir AF (BIKTARVY) 50-200-25 MG per tablet 1 tablet        1 tablet Oral Daily 11/11/22 2009          MEDICATIONS: Scheduled Meds:  aMILoride  5 mg Oral Daily   amLODipine  5 mg Oral Daily   bictegravir-emtricitabine-tenofovir AF  1 tablet Oral Daily   carvedilol  6.25 mg Oral BID WC   enoxaparin (LOVENOX) injection  40 mg Subcutaneous Q24H   folic acid  1 mg Oral Daily   multivitamin with minerals  1 tablet Oral Daily   pantoprazole  40 mg Oral Q1200   sodium chloride flush  3 mL Intravenous Q12H   thiamine  100 mg Oral Daily   Or   thiamine  100 mg Intravenous Daily   Continuous Infusions:  sodium chloride 175 mL/hr at 11/15/22 0238   PRN Meds:.acetaminophen **OR** acetaminophen, albuterol, loperamide, LORazepam, trimethobenzamide   I have personally reviewed following labs and imaging studies  LABORATORY DATA: CBC: Recent Labs  Lab 11/11/22 0804 11/12/22 0209  WBC 6.6 8.4  HGB 11.1* 9.2*  HCT 30.8* 25.6*  MCV 92.5 93.8  PLT 349 310     Basic Metabolic Panel: Recent Labs  Lab 11/12/22 0544 11/12/22 1243 11/12/22 2133 11/13/22 0559 11/13/22 2104 11/14/22 0700 11/14/22 1227 11/15/22 0607  NA  --  142 143 141 138 133*  --  137  K  --  <2.0* 2.3* 2.2* 4.0 5.0  --  5.1  CL  --  87* 89* 90* 96* 94*  --  101  CO2  --  42* 41* 38* 33* 28  --  21*  GLUCOSE  --  132* 105* 98 127* 95  --  102*  BUN  --  <5* <5* <5* <5* <5*  --  6  CREATININE  --  0.73 0.66 0.65 0.81 0.73  --  0.72  CALCIUM  --  6.7* 7.0* 6.7* 7.1* 7.1*  --  8.2*  MG  --  1.8 1.6* 1.9  --  1.2*  --  1.7  PHOS 3.5 2.5 3.6  --   --   --  3.4 4.9*     GFR: Estimated Creatinine Clearance: 138.1 mL/min (by C-G formula based on SCr of 0.72 mg/dL).  Liver Function Tests: Recent Labs  Lab 11/11/22 0804 11/12/22 0209 11/13/22 0559 11/14/22 0700 11/15/22 0607  AST 434* 427* 454* 888* 695*  ALT 125* 115* 118* 176* 177*  ALKPHOS  148* 148* 135* 165* 140*  BILITOT 3.5* 3.3* 2.0* 1.5* 1.3*  PROT 6.4* 5.3* 5.4* 5.7* 6.6  ALBUMIN 2.4* 2.0* 2.0* 2.1* 2.4*    No results for input(s): "LIPASE", "AMYLASE" in the last 168 hours. No results for input(s): "AMMONIA" in the last 168 hours.  Coagulation Profile: Recent Labs  Lab 11/11/22 2052  INR 1.2     Cardiac Enzymes: Recent Labs  Lab 11/12/22 0209 11/13/22 0559 11/14/22 0700 11/14/22 1227 11/15/22 0917  CKTOTAL 25,674* 25,161* >50,000* 60,454* 09,811*  BNP (last 3 results) No results for input(s): "PROBNP" in the last 8760 hours.  Lipid Profile: No results for input(s): "CHOL", "HDL", "LDLCALC", "TRIG", "CHOLHDL", "LDLDIRECT" in the last 72 hours.  Thyroid Function Tests: No results for input(s): "TSH", "T4TOTAL", "FREET4", "T3FREE", "THYROIDAB" in the last 72 hours.  Anemia Panel: No results for input(s): "VITAMINB12", "FOLATE", "FERRITIN", "TIBC", "IRON", "RETICCTPCT" in the last 72 hours.  Urine analysis:    Component Value Date/Time   COLORURINE YELLOW 11/11/2022 1206   APPEARANCEUR CLEAR 11/11/2022 1206   LABSPEC 1.006 11/11/2022 1206   PHURINE 7.0 11/11/2022 1206   GLUCOSEU NEGATIVE 11/11/2022 1206   GLUCOSEU NEGATIVE 11/26/2021 1040   HGBUR MODERATE (A) 11/11/2022 1206   BILIRUBINUR NEGATIVE 11/11/2022 1206   KETONESUR NEGATIVE 11/11/2022 1206   PROTEINUR 30 (A) 11/11/2022 1206   UROBILINOGEN 2.0 (A) 11/26/2021 1040   NITRITE NEGATIVE 11/11/2022 1206   LEUKOCYTESUR NEGATIVE 11/11/2022 1206    Sepsis Labs: Lactic Acid, Venous    Component Value Date/Time   LATICACIDVEN 2.1 (HH) 11/12/2022 0903    MICROBIOLOGY: Recent Results (from the past 240 hour(s))  C Difficile Quick Screen w PCR reflex     Status: None   Collection Time: 11/11/22 10:18 PM   Specimen: STOOL  Result Value Ref Range Status   C Diff antigen NEGATIVE NEGATIVE Final   C Diff toxin NEGATIVE NEGATIVE Final   C Diff interpretation No C. difficile detected.   Final    Comment: Performed at [facility], [address], [place], [place] [address]    RADIOLOGY STUDIES/RESULTS: No results found.   LOS: 3 days   Jeoffrey Massed, MD  [facility]    To contact the attending provider between 7A-7P or the covering provider during after hours 7P-7A, please log into the web site [email or web address] and access using universal [place] password for that web site. If you do not have the password, please call the hospital operator.  11/15/2022, 11:44 AM

## 2022-11-15 NOTE — TOC Initial Note (Signed)
Transition of Care [facility]) - Initial/Assessment Note    Patient Details  Name: Harold Flores MRN: [id number] Date of Birth: [birth year]  Transition of Care [facility] CM/SW Contact:    Mearl Latin, LCSW Phone Number: 11/15/2022, 9:32 AM  Clinical Narrative:                 CSW received consult for ETOH use. CSW will follow up.  Expected Discharge Plan: Home/Self Care Barriers to Discharge: Continued Medical Work up   Patient Goals and CMS Choice            Expected Discharge Plan and Services In-house Referral: Clinical Social Work     Living arrangements for the past 2 months: Apartment                                      Prior Living Arrangements/Services Living arrangements for the past 2 months: Apartment   Patient language and need for interpreter reviewed:: Yes        Need for Family Participation in Patient Care: No (Comment) Care giver support system in place?: Yes (comment)   Criminal Activity/Legal Involvement Pertinent to Current Situation/Hospitalization: No - Comment as needed  Activities of Daily Living Home Assistive Devices/Equipment: None ADL Screening (condition at time of admission) Patient's cognitive ability adequate to safely complete daily activities?: Yes Is the patient deaf or have difficulty hearing?: No Does the patient have difficulty seeing, even when wearing glasses/contacts?: No Does the patient have difficulty concentrating, remembering, or making decisions?: No Patient able to express need for assistance with ADLs?: Yes Does the patient have difficulty dressing or bathing?: No Independently performs ADLs?: Yes (appropriate for developmental age) Does the patient have difficulty walking or climbing stairs?: No Weakness of Legs: Both Weakness of Arms/Hands: Both  Permission Sought/Granted                  Emotional Assessment       Orientation: : Oriented to Self, Oriented to Place, Oriented to  Time, Oriented  to Situation Alcohol / Substance Use: Alcohol Use Psych Involvement: No (comment)  Admission diagnosis:  Hypocalcemia [E83.51] Hypokalemia [E87.6] Alcohol abuse [F10.10] Weakness [R53.1] Patient Active Problem List   Diagnosis Date Noted   Weakness 11/11/2022   Rhabdomyolysis 11/11/2022   Hypocalcemia 11/11/2022   Ingrown toenail 11/11/2022   Elevated liver enzymes 11/11/2022   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   Homeless 03/26/2022   Thrush 03/25/2022   Gastroesophageal reflux disease    GBS (Guillain Barre syndrome) (HCC)    Leukopenia    Anemia    AIDS (HCC)    Sinus tachycardia    Essential hypertension    Transaminitis    ETOH abuse    Progressive focal motor weakness 08/15/2020   Acute encephalopathy    AMS (altered mental status)    Encephalopathy    Normocytic anemia 08/09/2020   Diarrhea 08/09/2020   Hypomagnesemia 08/09/2020   Hyponatremia 08/09/2020   Elevated troponin 08/09/2020   Folic acid deficiency 08/09/2020   Lower extremity weakness 08/08/2020   Acute metabolic encephalopathy 08/07/2020   Hypokalemia 08/03/2020   Paresthesia of both lower extremities 07/23/2020   Peripheral edema 07/23/2020   Abnormal ECG 07/23/2020   Gait disorder 07/20/2020   Smoker 09/08/2018   Medication monitoring encounter 09/08/2018   Vitamin D deficiency disease 04/29/2018   Vaccine counseling 05/07/2017   Screening examination for venereal disease  06/18/2016   HIV (human immunodeficiency virus infection) (HCC) 02/22/2015   Genital herpes in men 01/04/2015   Gastroesophageal reflux disease without esophagitis 01/04/2015   Tachycardia 12/21/2014   Syphili, 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 [place] [address] Phone: [phone] Fax: [phone]  [facility] [address] [place] - [address] [place] [address] Phone: [phone] Fax: [phone]  [facility] [address] [place] [address] Phone: [phone] Fax: [phone]     Social Determinants of Health (SDOH) Social History: SDOH Screenings   Food Insecurity: No Food Insecurity (11/11/2022)  Housing: Low Risk  (11/11/2022)  Transportation Needs: No Transportation Needs (11/11/2022)  Utilities: Not At Risk (11/11/2022)  Depression (PHQ2-9): Low Risk  (04/04/2022)  Tobacco Use: High Risk (11/11/2022)   SDOH Interventions:     Readmission Risk Interventions     No data to display

## 2022-11-16 DIAGNOSIS — B20 Human immunodeficiency virus [HIV] disease: Secondary | ICD-10-CM | POA: Diagnosis not present

## 2022-11-16 DIAGNOSIS — M6282 Rhabdomyolysis: Secondary | ICD-10-CM | POA: Diagnosis not present

## 2022-11-16 DIAGNOSIS — R7989 Other specified abnormal findings of blood chemistry: Secondary | ICD-10-CM | POA: Diagnosis not present

## 2022-11-16 DIAGNOSIS — R531 Weakness: Secondary | ICD-10-CM | POA: Diagnosis not present

## 2022-11-16 LAB — COMPREHENSIVE METABOLIC PANEL
ALT: 151 U/L — ABNORMAL HIGH (ref 0–44)
AST: 413 U/L — ABNORMAL HIGH (ref 15–41)
Albumin: 2.2 g/dL — ABNORMAL LOW (ref 3.5–5.0)
Alkaline Phosphatase: 128 U/L — ABNORMAL HIGH (ref 38–126)
Anion gap: 11 (ref 5–15)
BUN: 8 mg/dL (ref 6–20)
CO2: 26 mmol/L (ref 22–32)
Calcium: 8.2 mg/dL — ABNORMAL LOW (ref 8.9–10.3)
Chloride: 102 mmol/L (ref 98–111)
Creatinine, Ser: 0.88 mg/dL (ref 0.61–1.24)
GFR, Estimated: >60 mL/min (ref >60)
Glucose, Bld: 115 mg/dL — ABNORMAL HIGH (ref 70–99)
Potassium: 3.7 mmol/L (ref 3.5–5.1)
Sodium: 139 mmol/L (ref 135–145)
Total Bilirubin: 0.8 mg/dL (ref 0.3–1.2)
Total Protein: 5.6 g/dL — ABNORMAL LOW (ref 6.5–8.1)

## 2022-11-16 LAB — CK: Total CK: 20451 U/L — ABNORMAL HIGH (ref 49–397)

## 2022-11-16 LAB — MAGNESIUM: Magnesium: 1.6 mg/dL — ABNORMAL LOW (ref 1.7–2.4)

## 2022-11-16 MED ORDER — MAGNESIUM SULFATE 4 GM/100ML IV SOLN
4.0000 g | Freq: Once | INTRAVENOUS | Status: AC
  Administered 2022-11-16: 4 g via INTRAVENOUS
  Filled 2022-11-16: qty 100

## 2022-11-16 NOTE — Progress Notes (Signed)
PROGRESS NOTE        PATIENT DETAILS Name: Harold Flores Age: 32 y.o. Sex: male Date of Birth: [birth year] Admit Date: 11/11/2022 Admitting Physician Clydie Braun, MD ONG:EXBM, Harold Blalock, MD  Brief Summary: Patient is a 32 y.o.  male with history of HIV, HTN, alcohol use-presented with fatigue/weakness-found to have rhabdomyolysis, severe hypokalemia-subsequently admitted to the hospitalist service.  Significant events: 6/3>> admit to [facility]  Significant studies: 6/3>> CXR: No PNA 6/3>>RUQ ultrasound: Increased echogenicity of hepatic parenchyma.  Significant microbiology data: 6/3>> stool for C. difficile toxin/antigen: Negative  Procedures: None  Consults: ID  Subjective: No major issues overnight-lying comfortably in bed.  Objective: Vitals: Blood pressure (!) 132/100, pulse (!) 108, temperature 98.2 F (36.8 C), temperature source Oral, resp. rate 16, height 5\' 10"  (1.778 m), weight 79.4 kg, SpO2 98 %.   Exam: Gen Exam:Alert awake-not in any distress HEENT:atraumatic, normocephalic Chest: B/L clear to auscultation anteriorly CVS:S1S2 regular Abdomen:soft non tender, non distended Extremities:no edema Neurology: Non focal Skin: no rash  Pertinent Labs/Radiology:    Latest Ref Rng & Units 11/12/2022    2:09 AM 11/11/2022    8:04 AM 04/04/2022    9:24 AM  CBC  WBC 4.0 - 10.5 K/uL 8.4  6.6  7.2   Hemoglobin 13.0 - 17.0 g/dL 9.2  84.1  8.8   Hematocrit 39.0 - 52.0 % 25.6  30.8  27.6   Platelets 150 - 400 K/uL 310  349  545     Lab Results  Component Value Date   NA 139 11/16/2022   K 3.7 11/16/2022   CL 102 11/16/2022   CO2 26 11/16/2022     Assessment/Plan: Nontraumatic rhabdomyolysis Unclear why had significant jump in CK levels on 6/6.  Phosphorus levels were normal-unlikely to be due to refeeding syndrome.  No obvious seizures. In any event-CK now downtrending with IVF.  Continue supportive care-repeat CK  tomorrow.  Severe hypokalemia In the setting of alcohol use K has finally normalized. Continue close monitoring-remains on amiloride.  Hypomagnesemia Continues to have hypomagnesemia-continue to replete today  Transaminitis Secondary to rhabdomyolysis Downtrending Continue to follow.    EtOH use Last drink on 6/3 per patient report No major withdrawal symptoms-no tremors-completely awake/alert. Managed with Ativan per CIWA protocol  HIV/AIDS Poor compliance with antiretrovirals ID following-now on Biktarvy  Normocytic anemia Secondary to chronic disease Follow CBC periodically  Ingrowing left toenail-great toe No significant tenderness/erythema on exam Podiatry referral in the outpatient setting  History of Guillain-Barr syndrome 2022 Debility/deconditioning due to rhabdomyolysis/profound electrolyte abnormalities Mobilize with PT/OT.  BMI: Estimated body mass index is 25.11 kg/m as calculated from the following:   Height as of this encounter: 5\' 10"  (1.778 m).   Weight as of this encounter: 79.4 kg.   Code status:   Code Status: Full Code   DVT Prophylaxis: enoxaparin (LOVENOX) injection 40 mg Start: 11/11/22 1200   Family Communication: None at bedside  Disposition Plan: Status is: Inpatient Remains inpatient appropriate because: Severity of illness   Planned Discharge Destination:Home   Diet: Diet Order             Diet Heart Room service appropriate? Yes; Fluid consistency: Thin  Diet effective now                     Antimicrobial agents: Anti-infectives (From  admission, onward)    Start     Dose/Rate Route Frequency Ordered Stop   11/12/22 0000  bictegravir-emtricitabine-tenofovir AF (BIKTARVY) 50-200-25 MG TABS tablet        1 tablet Oral Daily 11/12/22 0942 12/12/22 2359   11/11/22 2100  bictegravir-emtricitabine-tenofovir AF (BIKTARVY) 50-200-25 MG per tablet 1 tablet        1 tablet Oral Daily 11/11/22 2009           MEDICATIONS: Scheduled Meds:  aMILoride  5 mg Oral Daily   amLODipine  5 mg Oral Daily   bictegravir-emtricitabine-tenofovir AF  1 tablet Oral Daily   carvedilol  6.25 mg Oral BID WC   enoxaparin (LOVENOX) injection  40 mg Subcutaneous Q24H   folic acid  1 mg Oral Daily   multivitamin with minerals  1 tablet Oral Daily   pantoprazole  40 mg Oral Q1200   sodium chloride flush  3 mL Intravenous Q12H   thiamine  100 mg Oral Daily   Or   thiamine  100 mg Intravenous Daily   Continuous Infusions:  sodium chloride 175 mL/hr at 11/16/22 0640   magnesium sulfate bolus IVPB 4 g (11/16/22 0937)   PRN Meds:.acetaminophen **OR** acetaminophen, albuterol, loperamide, LORazepam, trimethobenzamide   I have personally reviewed following labs and imaging studies  LABORATORY DATA: CBC: Recent Labs  Lab 11/11/22 0804 11/12/22 0209  WBC 6.6 8.4  HGB 11.1* 9.2*  HCT 30.8* 25.6*  MCV 92.5 93.8  PLT 349 310     Basic Metabolic Panel: Recent Labs  Lab 11/12/22 0544 11/12/22 1243 11/12/22 2133 11/13/22 0559 11/13/22 2104 11/14/22 0700 11/14/22 1227 11/15/22 0607 11/16/22 0251  NA  --  142 143 141 138 133*  --  137 139  K  --  <2.0* 2.3* 2.2* 4.0 5.0  --  5.1 3.7  CL  --  87* 89* 90* 96* 94*  --  101 102  CO2  --  42* 41* 38* 33* 28  --  21* 26  GLUCOSE  --  132* 105* 98 127* 95  --  102* 115*  BUN  --  <5* <5* <5* <5* <5*  --  6 8  CREATININE  --  0.73 0.66 0.65 0.81 0.73  --  0.72 0.88  CALCIUM  --  6.7* 7.0* 6.7* 7.1* 7.1*  --  8.2* 8.2*  MG  --  1.8 1.6* 1.9  --  1.2*  --  1.7 1.6*  PHOS 3.5 2.5 3.6  --   --   --  3.4 4.9*  --      GFR: Estimated Creatinine Clearance: 125.6 mL/min (by C-G formula based on SCr of 0.88 mg/dL).  Liver Function Tests: Recent Labs  Lab 11/12/22 0209 11/13/22 0559 11/14/22 0700 11/15/22 0607 11/16/22 0251  AST 427* 454* 888* 695* 413*  ALT 115* 118* 176* 177* 151*  ALKPHOS 148* 135* 165* 140* 128*  BILITOT 3.3* 2.0* 1.5* 1.3*  0.8  PROT 5.3* 5.4* 5.7* 6.6 5.6*  ALBUMIN 2.0* 2.0* 2.1* 2.4* 2.2*    No results for input(s): "LIPASE", "AMYLASE" in the last 168 hours. No results for input(s): "AMMONIA" in the last 168 hours.  Coagulation Profile: Recent Labs  Lab 11/11/22 2052  INR 1.2     Cardiac Enzymes: Recent Labs  Lab 11/13/22 0559 11/14/22 0700 11/14/22 1227 11/15/22 0917 11/16/22 0244  CKTOTAL 25,161* >50,000* 48,081* 40,981* 20,451*     BNP (last 3 results) No results for input(s): "PROBNP" in the last  8760 hours.  Lipid Profile: No results for input(s): "CHOL", "HDL", "LDLCALC", "TRIG", "CHOLHDL", "LDLDIRECT" in the last 72 hours.  Thyroid Function Tests: No results for input(s): "TSH", "T4TOTAL", "FREET4", "T3FREE", "THYROIDAB" in the last 72 hours.  Anemia Panel: No results for input(s): "VITAMINB12", "FOLATE", "FERRITIN", "TIBC", "IRON", "RETICCTPCT" in the last 72 hours.  Urine analysis:    Component Value Date/Time   COLORURINE YELLOW 11/11/2022 1206   APPEARANCEUR CLEAR 11/11/2022 1206   LABSPEC 1.006 11/11/2022 1206   PHURINE 7.0 11/11/2022 1206   GLUCOSEU NEGATIVE 11/11/2022 1206   GLUCOSEU NEGATIVE 11/26/2021 1040   HGBUR MODERATE (A) 11/11/2022 1206   BILIRUBINUR NEGATIVE 11/11/2022 1206   KETONESUR NEGATIVE 11/11/2022 1206   PROTEINUR 30 (A) 11/11/2022 1206   UROBILINOGEN 2.0 (A) 11/26/2021 1040   NITRITE NEGATIVE 11/11/2022 1206   LEUKOCYTESUR NEGATIVE 11/11/2022 1206    Sepsis Labs: Lactic Acid, Venous    Component Value Date/Time   LATICACIDVEN 2.1 (HH) 11/12/2022 0903    MICROBIOLOGY: Recent Results (from the past 240 hour(s))  C Difficile Quick Screen w PCR reflex     Status: None   Collection Time: 11/11/22 10:18 PM   Specimen: STOOL  Result Value Ref Range Status   C Diff antigen NEGATIVE NEGATIVE Final   C Diff toxin NEGATIVE NEGATIVE Final   C Diff interpretation No C. difficile detected.  Final    Comment: Performed at [facility], [address] [place], [place] [address]    RADIOLOGY STUDIES/RESULTS: No results found.   LOS: 4 days   Jeoffrey Massed, MD  [facility]    To contact the attending provider between 7A-7P or the covering provider during after hours 7P-7A, please log into the web site [email or web address] and access using universal [place] password for that web site. If you do not have the password, please call the hospital operator.  11/16/2022, 10:32 AM

## 2022-11-17 DIAGNOSIS — R531 Weakness: Secondary | ICD-10-CM | POA: Diagnosis not present

## 2022-11-17 DIAGNOSIS — R7989 Other specified abnormal findings of blood chemistry: Secondary | ICD-10-CM | POA: Diagnosis not present

## 2022-11-17 DIAGNOSIS — M6282 Rhabdomyolysis: Secondary | ICD-10-CM | POA: Diagnosis not present

## 2022-11-17 DIAGNOSIS — B20 Human immunodeficiency virus [HIV] disease: Secondary | ICD-10-CM | POA: Diagnosis not present

## 2022-11-17 LAB — COMPREHENSIVE METABOLIC PANEL
ALT: 152 U/L — ABNORMAL HIGH (ref 0–44)
AST: 256 U/L — ABNORMAL HIGH (ref 15–41)
Albumin: 2.3 g/dL — ABNORMAL LOW (ref 3.5–5.0)
Alkaline Phosphatase: 109 U/L (ref 38–126)
Anion gap: 11 (ref 5–15)
BUN: 8 mg/dL (ref 6–20)
CO2: 26 mmol/L (ref 22–32)
Calcium: 8.6 mg/dL — ABNORMAL LOW (ref 8.9–10.3)
Chloride: 102 mmol/L (ref 98–111)
Creatinine, Ser: 0.8 mg/dL (ref 0.61–1.24)
GFR, Estimated: >60 mL/min (ref >60)
Glucose, Bld: 101 mg/dL — ABNORMAL HIGH (ref 70–99)
Potassium: 3.3 mmol/L — ABNORMAL LOW (ref 3.5–5.1)
Sodium: 139 mmol/L (ref 135–145)
Total Bilirubin: 0.8 mg/dL (ref 0.3–1.2)
Total Protein: 6 g/dL — ABNORMAL LOW (ref 6.5–8.1)

## 2022-11-17 LAB — CK: Total CK: 9005 U/L — ABNORMAL HIGH (ref 49–397)

## 2022-11-17 LAB — POTASSIUM: Potassium: 3.5 mmol/L (ref 3.5–5.1)

## 2022-11-17 LAB — MAGNESIUM
Magnesium: 1.5 mg/dL — ABNORMAL LOW (ref 1.7–2.4)
Magnesium: 2.2 mg/dL (ref 1.7–2.4)

## 2022-11-17 MED ORDER — MAGNESIUM SULFATE 4 GM/100ML IV SOLN
4.0000 g | Freq: Once | INTRAVENOUS | Status: AC
  Administered 2022-11-17: 4 g via INTRAVENOUS
  Filled 2022-11-17: qty 100

## 2022-11-17 MED ORDER — POTASSIUM CHLORIDE CRYS ER 20 MEQ PO TBCR
20.0000 meq | EXTENDED_RELEASE_TABLET | Freq: Once | ORAL | Status: AC
  Administered 2022-11-17: 20 meq via ORAL
  Filled 2022-11-17: qty 1

## 2022-11-17 MED ORDER — FAMOTIDINE 20 MG PO TABS
20.0000 mg | ORAL_TABLET | Freq: Every day | ORAL | Status: DC
  Administered 2022-11-17 – 2022-11-18 (×2): 20 mg via ORAL
  Filled 2022-11-17 (×2): qty 1

## 2022-11-17 MED ORDER — POTASSIUM CHLORIDE CRYS ER 20 MEQ PO TBCR
40.0000 meq | EXTENDED_RELEASE_TABLET | Freq: Once | ORAL | Status: AC
  Administered 2022-11-17: 40 meq via ORAL
  Filled 2022-11-17: qty 2

## 2022-11-17 NOTE — Progress Notes (Signed)
PROGRESS NOTE        PATIENT DETAILS Name: Harold Flores Age: 32 y.o. Sex: male Date of Birth: 1991-04-04 Admit Date: 11/11/2022 Admitting Physician Clydie Braun, MD ZOX:[id number], Len Blalock, MD  Brief Summary: Patient is a 32 y.o.  male with history of HIV, HTN, alcohol use-presented with fatigue/weakness-found to have rhabdomyolysis, severe hypokalemia-subsequently admitted to the hospitalist service.  Significant events: 6/3>> admit to [facility]  Significant studies: 6/3>> CXR: No PNA 6/3>>RUQ ultrasound: Increased echogenicity of hepatic parenchyma.  Significant microbiology data: 6/3>> stool for C. difficile toxin/antigen: Negative  Procedures: None  Consults: ID  Subjective: No major issues overnight-lying comfortably in bed.  Objective: Vitals: Blood pressure (!) 139/105, pulse 81, temperature 98.4 F (36.9 C), temperature source Oral, resp. rate 18, height 5\' 10"  (1.778 m), weight 79.4 kg, SpO2 98 %.   Exam: Gen Exam:Alert awake-not in any distress HEENT:atraumatic, normocephalic Chest: B/L clear to auscultation anteriorly CVS:S1S2 regular Abdomen:soft non tender, non distended Extremities:no edema Neurology: Non focal Skin: no rash  Pertinent Labs/Radiology:    Latest Ref Rng & Units 11/12/2022    2:09 AM 11/11/2022    8:04 AM 04/04/2022    9:24 AM  CBC  WBC 4.0 - 10.5 K/uL 8.4  6.6  7.2   Hemoglobin 13.0 - 17.0 g/dL 9.2  45.4  8.8   Hematocrit 39.0 - 52.0 % 25.6  30.8  27.6   Platelets 150 - 400 K/uL 310  349  545     Lab Results  Component Value Date   NA 139 11/17/2022   K 3.3 (L) 11/17/2022   CL 102 11/17/2022   CO2 26 11/17/2022     Assessment/Plan: Nontraumatic rhabdomyolysis Unclear why had significant jump in CK levels on 6/6.  Phosphorus levels were normal-unlikely to be due to refeeding syndrome.  No obvious seizures CK levels decreasing with IV fluid-rate of IV fluid decreased to 100 cc an hour Follow CK  levels  Severe hypokalemia In the setting of alcohol use K slightly borderline low Replete today Continue amiloride.  Hypomagnesemia Unclear why he continues to have hypomagnesemia-?  PPI use Stop PPI-start H2 blocker Repeat magnesium today Recheck electrolytes tomorrow.    Transaminitis Secondary to rhabdomyolysis Downtrending Continue to follow.    EtOH use Last drink on 6/3 per patient report No major withdrawal symptoms-no tremors-completely awake/alert. Managed with Ativan per CIWA protocol  HIV/AIDS Poor compliance with antiretrovirals ID following-now on Biktarvy  Normocytic anemia Secondary to chronic disease Follow CBC periodically  Ingrowing left toenail-great toe No significant tenderness/erythema on exam Podiatry referral in the outpatient setting  History of Guillain-Barr syndrome 2022 Debility/deconditioning due to rhabdomyolysis/profound electrolyte abnormalities Mobilize with PT/OT.  BMI: Estimated body mass index is 25.11 kg/m as calculated from the following:   Height as of this encounter: 5\' 10"  (1.778 m).   Weight as of this encounter: 79.4 kg.   Code status:   Code Status: Full Code   DVT Prophylaxis: enoxaparin (LOVENOX) injection 40 mg Start: 11/11/22 1200   Family Communication: None at bedside  Disposition Plan: Status is: Inpatient Remains inpatient appropriate because: Severity of illness   Planned Discharge Destination:Home   Diet: Diet Order             Diet Heart Room service appropriate? Yes; Fluid consistency: Thin  Diet effective now  Antimicrobial agents: Anti-infectives (From admission, onward)    Start     Dose/Rate Route Frequency Ordered Stop   11/12/22 0000  bictegravir-emtricitabine-tenofovir AF (BIKTARVY) 50-200-25 MG TABS tablet        1 tablet Oral Daily 11/12/22 0942 12/12/22 2359   11/11/22 2100  bictegravir-emtricitabine-tenofovir AF (BIKTARVY) 50-200-25 MG per tablet 1  tablet        1 tablet Oral Daily 11/11/22 2009          MEDICATIONS: Scheduled Meds:  aMILoride  5 mg Oral Daily   amLODipine  5 mg Oral Daily   bictegravir-emtricitabine-tenofovir AF  1 tablet Oral Daily   carvedilol  6.25 mg Oral BID WC   enoxaparin (LOVENOX) injection  40 mg Subcutaneous Q24H   famotidine  20 mg Oral Daily   folic acid  1 mg Oral Daily   multivitamin with minerals  1 tablet Oral Daily   sodium chloride flush  3 mL Intravenous Q12H   thiamine  100 mg Oral Daily   Or   thiamine  100 mg Intravenous Daily   Continuous Infusions:  sodium chloride 100 mL/hr at 11/17/22 0800   PRN Meds:.acetaminophen **OR** acetaminophen, albuterol, loperamide, LORazepam, trimethobenzamide   I have personally reviewed following labs and imaging studies  LABORATORY DATA: CBC: Recent Labs  Lab 11/11/22 0804 11/12/22 0209  WBC 6.6 8.4  HGB 11.1* 9.2*  HCT 30.8* 25.6*  MCV 92.5 93.8  PLT 349 310     Basic Metabolic Panel: Recent Labs  Lab 11/12/22 0544 11/12/22 1243 11/12/22 2133 11/13/22 0559 11/13/22 2104 11/14/22 0700 11/14/22 1227 11/15/22 0607 11/16/22 0251 11/17/22 0344  NA  --  142 143 141 138 133*  --  137 139 139  K  --  <2.0* 2.3* 2.2* 4.0 5.0  --  5.1 3.7 3.3*  CL  --  87* 89* 90* 96* 94*  --  101 102 102  CO2  --  42* 41* 38* 33* 28  --  21* 26 26  GLUCOSE  --  132* 105* 98 127* 95  --  102* 115* 101*  BUN  --  <5* <5* <5* <5* <5*  --  6 8 8   CREATININE  --  0.73 0.66 0.65 0.81 0.73  --  0.72 0.88 0.80  CALCIUM  --  6.7* 7.0* 6.7* 7.1* 7.1*  --  8.2* 8.2* 8.6*  MG  --  1.8 1.6* 1.9  --  1.2*  --  1.7 1.6* 1.5*  PHOS 3.5 2.5 3.6  --   --   --  3.4 4.9*  --   --      GFR: Estimated Creatinine Clearance: 138.1 mL/min (by C-G formula based on SCr of 0.8 mg/dL).  Liver Function Tests: Recent Labs  Lab 11/13/22 0559 11/14/22 0700 11/15/22 0607 11/16/22 0251 11/17/22 0344  AST 454* 888* 695* 413* 256*  ALT 118* 176* 177* 151* 152*   ALKPHOS 135* 165* 140* 128* 109  BILITOT 2.0* 1.5* 1.3* 0.8 0.8  PROT 5.4* 5.7* 6.6 5.6* 6.0*  ALBUMIN 2.0* 2.1* 2.4* 2.2* 2.3*    No results for input(s): "LIPASE", "AMYLASE" in the last 168 hours. No results for input(s): "AMMONIA" in the last 168 hours.  Coagulation Profile: Recent Labs  Lab 11/11/22 2052  INR 1.2     Cardiac Enzymes: Recent Labs  Lab 11/14/22 0700 11/14/22 1227 11/15/22 0917 11/16/22 0244 11/17/22 0344  CKTOTAL >50,000* 48,081* 40,981* 20,451* 9,005*     BNP (last 3 results)  No results for input(s): "PROBNP" in the last 8760 hours.  Lipid Profile: No results for input(s): "CHOL", "HDL", "LDLCALC", "TRIG", "CHOLHDL", "LDLDIRECT" in the last 72 hours.  Thyroid Function Tests: No results for input(s): "TSH", "T4TOTAL", "FREET4", "T3FREE", "THYROIDAB" in the last 72 hours.  Anemia Panel: No results for input(s): "VITAMINB12", "FOLATE", "FERRITIN", "TIBC", "IRON", "RETICCTPCT" in the last 72 hours.  Urine analysis:    Component Value Date/Time   COLORURINE YELLOW 11/11/2022 1206   APPEARANCEUR CLEAR 11/11/2022 1206   LABSPEC 1.006 11/11/2022 1206   PHURINE 7.0 11/11/2022 1206   GLUCOSEU NEGATIVE 11/11/2022 1206   GLUCOSEU NEGATIVE 11/26/2021 1040   HGBUR MODERATE (A) 11/11/2022 1206   BILIRUBINUR NEGATIVE 11/11/2022 1206   KETONESUR NEGATIVE 11/11/2022 1206   PROTEINUR 30 (A) 11/11/2022 1206   UROBILINOGEN 2.0 (A) 11/26/2021 1040   NITRITE NEGATIVE 11/11/2022 1206   LEUKOCYTESUR NEGATIVE 11/11/2022 1206    Sepsis Labs: Lactic Acid, Venous    Component Value Date/Time   LATICACIDVEN 2.1 (HH) 11/12/2022 0903    MICROBIOLOGY: Recent Results (from the past 240 hour(s))  C Difficile Quick Screen w PCR reflex     Status: None   Collection Time: 11/11/22 10:18 PM   Specimen: STOOL  Result Value Ref Range Status   C Diff antigen NEGATIVE NEGATIVE Final   C Diff toxin NEGATIVE NEGATIVE Final   C Diff interpretation No C. difficile  detected.  Final    Comment: Performed at [facility], [address], [place], [place] [address]    RADIOLOGY STUDIES/RESULTS: No results found.   LOS: 5 days   Jeoffrey Massed, MD  [facility]    To contact the attending provider between 7A-7P or the covering provider during after hours 7P-7A, please log into the web site [email or web address] and access using universal [place] password for that web site. If you do not have the password, please call the hospital operator.  11/17/2022, 10:22 AM

## 2022-11-18 ENCOUNTER — Other Ambulatory Visit (HOSPITAL_COMMUNITY): Payer: Self-pay

## 2022-11-18 DIAGNOSIS — I10 Essential (primary) hypertension: Secondary | ICD-10-CM

## 2022-11-18 DIAGNOSIS — R531 Weakness: Secondary | ICD-10-CM | POA: Diagnosis not present

## 2022-11-18 DIAGNOSIS — F1010 Alcohol abuse, uncomplicated: Secondary | ICD-10-CM | POA: Diagnosis not present

## 2022-11-18 DIAGNOSIS — B20 Human immunodeficiency virus [HIV] disease: Secondary | ICD-10-CM | POA: Diagnosis not present

## 2022-11-18 LAB — COMPREHENSIVE METABOLIC PANEL
ALT: 145 U/L — ABNORMAL HIGH (ref 0–44)
AST: 162 U/L — ABNORMAL HIGH (ref 15–41)
Albumin: 2.4 g/dL — ABNORMAL LOW (ref 3.5–5.0)
Alkaline Phosphatase: 108 U/L (ref 38–126)
Anion gap: 9 (ref 5–15)
BUN: 7 mg/dL (ref 6–20)
CO2: 25 mmol/L (ref 22–32)
Calcium: 8.4 mg/dL — ABNORMAL LOW (ref 8.9–10.3)
Chloride: 104 mmol/L (ref 98–111)
Creatinine, Ser: 0.84 mg/dL (ref 0.61–1.24)
GFR, Estimated: >60 mL/min (ref >60)
Glucose, Bld: 137 mg/dL — ABNORMAL HIGH (ref 70–99)
Potassium: 3.3 mmol/L — ABNORMAL LOW (ref 3.5–5.1)
Sodium: 138 mmol/L (ref 135–145)
Total Bilirubin: 0.6 mg/dL (ref 0.3–1.2)
Total Protein: 6.2 g/dL — ABNORMAL LOW (ref 6.5–8.1)

## 2022-11-18 LAB — MAGNESIUM: Magnesium: 1.5 mg/dL — ABNORMAL LOW (ref 1.7–2.4)

## 2022-11-18 LAB — CK: Total CK: 3444 U/L — ABNORMAL HIGH (ref 49–397)

## 2022-11-18 MED ORDER — POTASSIUM CHLORIDE CRYS ER 20 MEQ PO TBCR
20.0000 meq | EXTENDED_RELEASE_TABLET | Freq: Every day | ORAL | 0 refills | Status: DC
  Filled 2022-11-18: qty 30, 30d supply, fill #0

## 2022-11-18 MED ORDER — AMLODIPINE BESYLATE 10 MG PO TABS
5.0000 mg | ORAL_TABLET | Freq: Every day | ORAL | 1 refills | Status: DC
  Filled 2022-11-18: qty 45, 90d supply, fill #0

## 2022-11-18 MED ORDER — AMILORIDE HCL 5 MG PO TABS
10.0000 mg | ORAL_TABLET | Freq: Every day | ORAL | 0 refills | Status: DC
  Filled 2022-11-18: qty 30, 15d supply, fill #0

## 2022-11-18 MED ORDER — MAGNESIUM OXIDE -MG SUPPLEMENT 400 (240 MG) MG PO TABS: 400.0000 mg | ORAL_TABLET | Freq: Every day | ORAL | Status: DC

## 2022-11-18 MED ORDER — MAGNESIUM OXIDE -MG SUPPLEMENT 400 (240 MG) MG PO TABS: 400.0000 mg | ORAL_TABLET | Freq: Two times a day (BID) | ORAL | Status: DC

## 2022-11-18 MED ORDER — CARVEDILOL 6.25 MG PO TABS
ORAL_TABLET | ORAL | 1 refills | Status: DC
Start: 2022-11-18 — End: 2022-12-23
  Filled 2022-11-18: qty 60, 30d supply, fill #0

## 2022-11-18 MED ORDER — POTASSIUM CHLORIDE CRYS ER 20 MEQ PO TBCR
40.0000 meq | EXTENDED_RELEASE_TABLET | Freq: Once | ORAL | Status: AC
  Administered 2022-11-18: 40 meq via ORAL
  Filled 2022-11-18: qty 2

## 2022-11-18 MED ORDER — AMILORIDE HCL 5 MG PO TABS
10.0000 mg | ORAL_TABLET | Freq: Every day | ORAL | 0 refills | Status: DC
  Filled 2022-11-18: qty 60, 30d supply, fill #0

## 2022-11-18 MED ORDER — POTASSIUM CHLORIDE CRYS ER 20 MEQ PO TBCR
20.0000 meq | EXTENDED_RELEASE_TABLET | Freq: Every day | ORAL | Status: DC
  Administered 2022-11-18: 20 meq via ORAL
  Filled 2022-11-18: qty 1

## 2022-11-18 MED ORDER — MAGNESIUM SULFATE 4 GM/100ML IV SOLN
4.0000 g | Freq: Once | INTRAVENOUS | Status: AC
  Administered 2022-11-18: 4 g via INTRAVENOUS
  Filled 2022-11-18: qty 100

## 2022-11-18 MED ORDER — AMILORIDE HCL 5 MG PO TABS: 10.0000 mg | ORAL_TABLET | Freq: Every day | ORAL | Status: DC

## 2022-11-18 MED ORDER — MAGNESIUM OXIDE -MG SUPPLEMENT 400 (240 MG) MG PO TABS
400.0000 mg | ORAL_TABLET | Freq: Two times a day (BID) | ORAL | 0 refills | Status: DC
  Filled 2022-11-18: qty 60, 30d supply, fill #0

## 2022-11-18 MED ORDER — FAMOTIDINE 20 MG PO TABS
20.0000 mg | ORAL_TABLET | Freq: Every day | ORAL | 1 refills | Status: DC
  Filled 2022-11-18: qty 30, 30d supply, fill #0

## 2022-11-18 NOTE — Discharge Summary (Addendum)
PATIENT DETAILS Name: Harold Flores Age: 32 y.o. Sex: male Date of Birth: 06-29-1990 MRN: [id number]. Admitting Physician: Clydie Braun, MD [id number], Len Blalock, MD  Admit Date: 11/11/2022 Discharge date: 11/18/2022  Recommendations for Outpatient Follow-up:  Follow up with PCP in 1-2 weeks Please obtain CMP/CBC in one week  Admitted From:  Home  Disposition: Home   Discharge Condition: good  CODE STATUS:   Code Status: Full Code   Diet recommendation:  Diet Order             Diet - low sodium heart healthy           Diet Heart Room service appropriate? Yes; Fluid consistency: Thin  Diet effective now                    Brief Summary: Patient is a 32 y.o.  male with history of HIV, HTN, alcohol use-presented with fatigue/weakness-found to have rhabdomyolysis, severe hypokalemia-subsequently admitted to the hospitalist service.   Significant events: 6/3>> admit to [facility]   Significant studies: 6/3>> CXR: No PNA 6/3>>RUQ ultrasound: Increased echogenicity of hepatic parenchyma.   Significant microbiology data: 6/3>> stool for C. difficile toxin/antigen: Negative   Procedures: None   Consults: ID  Brief Hospital Course: Nontraumatic rhabdomyolysis Managed with supportive care-unclear why patient had a significant jump in CK levels on 6/6.  No obvious seizures-phosphorus levels were normal-unlikely to be refeeding syndrome Thankfully with further continued supportive care-CK levels have trended down significantly.     Severe hypokalemia In the setting of alcohol use and hypomagnesemia Required potassium supplementation almost on a daily basis Started on a potassium sparing diuretic-dosage to be increased to 10 mg today Have asked patient to repeat electrolytes in 1 week at PCPs office Continue potassium supplementation on discharge.   Hypomagnesemia Unclear why he continues to have hypomagnesemia-?  PPI use-EtOH use PPI switch to H2  blocker Continue magnesium supplementation on discharge Repeat electrolytes in 1 week at PCPs office.    Transaminitis Secondary to rhabdomyolysis Downtrending Continue to follow-repeat electrolytes in 1 week.   EtOH use Last drink on 6/3 per patient report No major withdrawal symptoms-no tremors-completely awake/alert. Managed with Ativan per CIWA protocol   HIV/AIDS Poor compliance with antiretrovirals ID following-now on Biktarvy   Normocytic anemia Secondary to chronic disease Follow CBC periodically   Ingrowing left toenail-great toe No significant tenderness/erythema on exam Podiatry referral in the outpatient setting   History of Guillain-Barr syndrome 2022 Debility/deconditioning due to rhabdomyolysis/profound electrolyte abnormalities Remarkably better-after treatment of electrolyte abnormalities-ambulating in the room by himself.   BMI: Estimated body mass index is 25.11 kg/m as calculated from the following:   Height as of this encounter: 5\' 10"  (1.778 m).   Weight as of this encounter: 79.4 kg.   Discharge Diagnoses:  Principal Problem:   Weakness Active Problems:   Rhabdomyolysis   Hypokalemia   Hypomagnesemia   Hypocalcemia   Elevated troponin   ETOH abuse   HIV (human immunodeficiency virus infection) (HCC)   Ingrown toenail   Elevated liver enzymes   Discharge Instructions:  Activity:  As tolerated   Discharge Instructions     Call MD for:  extreme fatigue   Complete by: As directed    Call MD for:  persistant dizziness or light-headedness   Complete by: As directed    Call MD for:  persistant nausea and vomiting   Complete by: As directed    Call MD for:  severe uncontrolled pain  Complete by: As directed    Diet - low sodium heart healthy   Complete by: As directed    Discharge instructions   Complete by: As directed    Follow with Primary MD  Corwin Levins, MD in 1 week  Please get a complete blood count and chemistry panel  checked by your Primary MD at your next visit, and again as instructed by your Primary MD.  Get Medicines reviewed and adjusted: Please take all your medications with you for your next visit with your Primary MD  Laboratory/radiological data: Please request your Primary MD to go over all hospital tests and procedure/radiological results at the follow up, please ask your Primary MD to get all Hospital records sent to his/her office.  In some cases, they will be blood work, cultures and biopsy results pending at the time of your discharge. Please request that your primary care M.D. follows up on these results.  Also Note the following: If you experience worsening of your admission symptoms, develop shortness of breath, life threatening emergency, suicidal or homicidal thoughts you must seek medical attention immediately by calling 911 or calling your MD immediately  if symptoms less severe.  You must read complete instructions/literature along with all the possible adverse reactions/side effects for all the Medicines you take and that have been prescribed to you. Take any new Medicines after you have completely understood and accpet all the possible adverse reactions/side effects.   Do not drive when taking Pain medications or sleeping medications (Benzodaizepines)  Do not take more than prescribed Pain, Sleep and Anxiety Medications. It is not advisable to combine anxiety,sleep and pain medications without talking with your primary care practitioner  Special Instructions: If you have smoked or chewed Tobacco  in the last 2 yrs please stop smoking, stop any regular Alcohol  and or any Recreational drug use.  Wear Seat belts while driving.  Please note: You were cared for by a hospitalist during your hospital stay. Once you are discharged, your primary care physician will handle any further medical issues. Please note that NO REFILLS for any discharge medications will be authorized once you are  discharged, as it is imperative that you return to your primary care physician (or establish a relationship with a primary care physician if you do not have one) for your post hospital discharge needs so that they can reassess your need for medications and monitor your lab values.   Increase activity slowly   Complete by: As directed       Allergies as of 11/18/2022   No Known Allergies      Medication List     STOP taking these medications    cyanocobalamin 1000 MCG tablet   fluconazole 100 MG tablet Commonly known as: DIFLUCAN   lansoprazole 30 MG capsule Commonly known as: PREVACID   Magnesium Oxide 400 MG Caps   sulfamethoxazole-trimethoprim 800-160 MG tablet Commonly known as: BACTRIM DS   thiamine 100 MG tablet Commonly known as: Vitamin B-1       TAKE these medications    aMILoride 5 MG tablet Commonly known as: MIDAMOR Take 2 tablets (10 mg total) by mouth daily. Start taking on: November 19, 2022   amLODipine 10 MG tablet Commonly known as: NORVASC Take 0.5 tablets (5 mg total) by mouth daily. What changed: how much to take   Biktarvy 50-200-25 MG Tabs tablet Generic drug: bictegravir-emtricitabine-tenofovir AF Take 1 tablet by mouth daily. What changed: Another medication with the same name  was removed. Continue taking this medication, and follow the directions you see here.   carvedilol 6.25 MG tablet Commonly known as: COREG TAKE 1 TABLET BY MOUTH 2 (TWO) TIMES DAILY. MUST KEEP SCHEDULED APPT FOR FUTURE REFILLS What changed: medication strength   famotidine 20 MG tablet Commonly known as: PEPCID Take 1 tablet (20 mg total) by mouth daily. Start taking on: November 19, 2022   folic acid 1 MG tablet Commonly known as: FOLVITE Take 2 tablets (2 mg total) by mouth daily.   loperamide 2 MG capsule Commonly known as: IMODIUM Take 1 capsule (2 mg total) by mouth as needed for diarrhea or loose stools.   magnesium oxide 400 (240 Mg) MG tablet Commonly  known as: MAG-OX Take 1 tablet (400 mg total) by mouth 2 (two) times daily. Start taking on: November 19, 2022   multivitamin with minerals Tabs tablet Take 1 tablet by mouth daily.   potassium chloride SA 20 MEQ tablet Commonly known as: KLOR-CON M Take 1 tablet (20 mEq total) by mouth daily. What changed: when to take this        Follow-up Information     Corwin Levins, MD. Schedule an appointment as soon as possible for a visit in 1 week(s).   Specialties: Internal Medicine, Radiology Why: Repeat electrolytes Contact information: [address] [place] [address] [phone]         Daiva Eves, Lisette Grinder, MD. Schedule an appointment as soon as possible for a visit in 2 week(s).   Specialty: Infectious Diseases Contact information: [address] [place] [address] [phone]                No Known Allergies   Other Procedures/Studies: US Abdomen Limited RUQ (LIVER/GB)  Result Date: 11/11/2022 CLINICAL DATA:  Transaminitis EXAM: ULTRASOUND ABDOMEN LIMITED RIGHT UPPER QUADRANT COMPARISON:  None available FINDINGS: Gallbladder: Gallstones: None Sludge: None Gallbladder Wall: Within normal limits Pericholecystic fluid: None Sonographic Murphy's Sign: Negative per technologist Common bile duct: Diameter: 4 mm Liver: Parenchymal echogenicity: Diffusely increased Contours: Normal Lesions: None Portal vein: Patent.  Hepatopetal flow Other: None. IMPRESSION: Diffuse increased echogenicity of the hepatic parenchyma is a nonspecific indicator of hepatocellular dysfunction, most commonly steatosis. Electronically Signed   By: Acquanetta Belling M.D.   On: 11/11/2022 11:38   DG Chest Port 1 View  Result Date: 11/11/2022 CLINICAL DATA:  Weakness EXAM: PORTABLE CHEST 1 VIEW COMPARISON:  03/26/2022 FINDINGS: The heart size and mediastinal contours are within normal limits. Both lungs are clear. The visualized skeletal structures are unremarkable. IMPRESSION: No active  disease. Electronically Signed   By: Judie Petit.  Shick M.D.   On: 11/11/2022 08:36     TODAY-DAY OF DISCHARGE:  Subjective:   Astrid Drafts today has no headache,no chest abdominal pain,no new weakness tingling or numbness, feels much better wants to go home today.   Objective:   Blood pressure (!) 138/98, pulse 88, temperature 98.5 F (36.9 C), temperature source Oral, resp. rate 20, height 5\' 10"  (1.778 m), weight 79.4 kg, SpO2 98 %.  Intake/Output Summary (Last 24 hours) at 11/18/2022 0921 Last data filed at 11/18/2022 0856 Gross per 24 hour  Intake 240 ml  Output 650 ml  Net -410 ml   Filed Weights   11/11/22 0746  Weight: 79.4 kg    Exam: Awake Alert, Oriented *3, No new F.N deficits, Normal affect [place].AT,PERRAL Supple Neck,No JVD, No cervical lymphadenopathy appriciated.  Symmetrical Chest wall movement, Good air movement bilaterally,  CTAB RRR,No Gallops,Rubs or new Murmurs, No Parasternal Heave +ve B.Sounds, Abd Soft, Non tender, No organomegaly appriciated, No rebound -guarding or rigidity. No Cyanosis, Clubbing or edema, No new Rash or bruise   PERTINENT RADIOLOGIC STUDIES: No results found.   PERTINENT LAB RESULTS: CBC: No results for input(s): "WBC", "HGB", "HCT", "PLT" in the last 72 hours. CMET CMP     Component Value Date/Time   NA 138 11/18/2022 0450   K 3.3 (L) 11/18/2022 0450   CL 104 11/18/2022 0450   CO2 25 11/18/2022 0450   GLUCOSE 137 (H) 11/18/2022 0450   BUN 7 11/18/2022 0450   CREATININE 0.84 11/18/2022 0450   CREATININE 0.85 04/04/2022 0924   CALCIUM 8.4 (L) 11/18/2022 0450   PROT 6.2 (L) 11/18/2022 0450   ALBUMIN 2.4 (L) 11/18/2022 0450   AST 162 (H) 11/18/2022 0450   ALT 145 (H) 11/18/2022 0450   ALKPHOS 108 11/18/2022 0450   BILITOT 0.6 11/18/2022 0450   GFR 121.23 11/26/2021 1040   EGFR 120 04/04/2022 0924   GFRNONAA >60 11/18/2022 0450   GFRNONAA 123 09/14/2020 1143    GFR Estimated Creatinine Clearance: 131.6 mL/min (by C-G  formula based on SCr of 0.84 mg/dL). No results for input(s): "LIPASE", "AMYLASE" in the last 72 hours. Recent Labs    11/16/22 0244 11/17/22 0344 11/18/22 0450  CKTOTAL 20,451* 9,005* 3,444*   Invalid input(s): "POCBNP" No results for input(s): "DDIMER" in the last 72 hours. No results for input(s): "HGBA1C" in the last 72 hours. No results for input(s): "CHOL", "HDL", "LDLCALC", "TRIG", "CHOLHDL", "LDLDIRECT" in the last 72 hours. No results for input(s): "TSH", "T4TOTAL", "T3FREE", "THYROIDAB" in the last 72 hours.  Invalid input(s): "FREET3" No results for input(s): "VITAMINB12", "FOLATE", "FERRITIN", "TIBC", "IRON", "RETICCTPCT" in the last 72 hours. Coags: No results for input(s): "INR" in the last 72 hours.  Invalid input(s): "PT" Microbiology: Recent Results (from the past 240 hour(s))  C Difficile Quick Screen w PCR reflex     Status: None   Collection Time: 11/11/22 10:18 PM   Specimen: STOOL  Result Value Ref Range Status   C Diff antigen NEGATIVE NEGATIVE Final   C Diff toxin NEGATIVE NEGATIVE Final   C Diff interpretation No C. difficile detected.  Final    Comment: Performed at [facility], [address] [place], [place] [address]    FURTHER DISCHARGE INSTRUCTIONS:  Get Medicines reviewed and adjusted: Please take all your medications with you for your next visit with your Primary MD  Laboratory/radiological data: Please request your Primary MD to go over all hospital tests and procedure/radiological results at the follow up, please ask your Primary MD to get all Hospital records sent to his/her office.  In some cases, they will be blood work, cultures and biopsy results pending at the time of your discharge. Please request that your primary care M.D. goes through all the records of your hospital data and follows up on these results.  Also Note the following: If you experience worsening of your admission symptoms, develop shortness of breath, life  threatening emergency, suicidal or homicidal thoughts you must seek medical attention immediately by calling 911 or calling your MD immediately  if symptoms less severe.  You must read complete instructions/literature along with all the possible adverse reactions/side effects for all the Medicines you take and that have been prescribed to you. Take any new Medicines after you have completely understood and accpet all the possible adverse reactions/side effects.  Do not drive when taking Pain medications or sleeping medications (Benzodaizepines)  Do not take more than prescribed Pain, Sleep and Anxiety Medications. It is not advisable to combine anxiety,sleep and pain medications without talking with your primary care practitioner  Special Instructions: If you have smoked or chewed Tobacco  in the last 2 yrs please stop smoking, stop any regular Alcohol  and or any Recreational drug use.  Wear Seat belts while driving.  Please note: You were cared for by a hospitalist during your hospital stay. Once you are discharged, your primary care physician will handle any further medical issues. Please note that NO REFILLS for any discharge medications will be authorized once you are discharged, as it is imperative that you return to your primary care physician (or establish a relationship with a primary care physician if you do not have one) for your post hospital discharge needs so that they can reassess your need for medications and monitor your lab values.  Total Time spent coordinating discharge including counseling, education and face to face time equals greater than 30 minutes.  SignedJeoffrey Massed 11/18/2022 9:21 AM

## 2022-11-19 ENCOUNTER — Telehealth: Payer: Self-pay

## 2022-11-19 NOTE — Transitions of Care (Post Inpatient/ED Visit) (Signed)
   11/19/2022  Name: Harold Flores MRN: [id number] DOB: 24-Jun-1990  Today's TOC FU Call Status: Today's TOC FU Call Status:: Successful TOC FU Call Competed TOC FU Call Complete Date: 11/19/22  Transition Care Management Follow-up Telephone Call Date of Discharge: 11/18/22 Discharge Facility: [facility]) Type of Discharge: Inpatient Admission Primary Inpatient Discharge Diagnosis:: Weakness How have you been since you were released from the hospital?: Better Any questions or concerns?: No  Items Reviewed: Did you receive and understand the discharge instructions provided?: Yes Medications obtained,verified, and reconciled?: Yes (Medications Reviewed) Any new allergies since your discharge?: No Dietary orders reviewed?: Yes Do you have support at home?: Yes  Medications Reviewed Today: Medications Reviewed Today     Reviewed by Merleen Nicely, LPN (Licensed Practical Nurse) on 11/19/22 at 1027  Med List Status: <None>   Medication Order Taking? Sig Documenting Provider Last Dose Status Informant  aMILoride (MIDAMOR) 5 MG tablet [id number] Yes Take 2 tablets (10 mg total) by mouth daily. Maretta Bees, MD Taking Active   amLODipine (NORVASC) 10 MG tablet [id number] Yes Take 0.5 tablets (5 mg total) by mouth daily. Maretta Bees, MD Taking Active   bictegravir-emtricitabine-tenofovir AF (BIKTARVY) 50-200-25 MG TABS tablet [id number] Yes Take 1 tablet by mouth daily. Daiva Eves, Lisette Grinder, MD Taking Active   carvedilol (COREG) 6.25 MG tablet [id number] Yes TAKE 1 TABLET BY MOUTH 2 (TWO) TIMES DAILY. MUST KEEP SCHEDULED APPT FOR FUTURE REFILLS Ghimire, Werner Lean, MD Taking Active   famotidine (PEPCID) 20 MG tablet [id number] Yes Take 1 tablet (20 mg total) by mouth daily. Maretta Bees, MD Taking Active   folic acid (FOLVITE) 1 MG tablet [id number] Yes Take 2 tablets (2 mg total) by mouth daily. Glade Lloyd, MD Taking Active Self  loperamide (IMODIUM) 2 MG capsule  [id number] Yes Take 1 capsule (2 mg total) by mouth as needed for diarrhea or loose stools. Glade Lloyd, MD Taking Active Self  magnesium oxide (MAG-OX) 400 (240 Mg) MG tablet [id number] Yes Take 1 tablet (400 mg total) by mouth 2 (two) times daily. Maretta Bees, MD Taking Active   Multiple Vitamin (MULTIVITAMIN WITH MINERALS) TABS tablet [id number] Yes Take 1 tablet by mouth daily. Glade Lloyd, MD Taking Active Self  potassium chloride SA (KLOR-CON M) 20 MEQ tablet [id number] Yes Take 1 tablet (20 mEq total) by mouth daily. Maretta Bees, MD Taking Active             Home Care and Equipment/Supplies: Were Home Health Services Ordered?: No Any new equipment or medical supplies ordered?: No  Functional Questionnaire: Do you need assistance with bathing/showering or dressing?: No Do you need assistance with meal preparation?: No Do you need assistance with eating?: No Do you have difficulty maintaining continence: No Do you need assistance with getting out of bed/getting out of a chair/moving?: No Do you have difficulty managing or taking your medications?: No  Follow up appointments reviewed: PCP Follow-up appointment confirmed?: Yes Date of PCP follow-up appointment?: 11/28/22 Follow-up Provider: Dr Jonny Ruiz [facility] Follow-up appointment confirmed?: NA Do you need transportation to your follow-up appointment?: No Do you understand care options if your condition(s) worsen?: Yes-patient verbalized understanding    SIGNATURE  Woodfin Ganja LPN [facility] Nurse Health Advisor Direct Dial [phone]

## 2022-11-28 ENCOUNTER — Ambulatory Visit (INDEPENDENT_AMBULATORY_CARE_PROVIDER_SITE_OTHER): Payer: No Typology Code available for payment source | Admitting: Internal Medicine

## 2022-11-28 ENCOUNTER — Encounter: Payer: Self-pay | Admitting: Internal Medicine

## 2022-11-28 VITALS — BP 120/78 | HR 105 | Temp 99.3°F | Ht 70.0 in | Wt 193.0 lb

## 2022-11-28 DIAGNOSIS — D509 Iron deficiency anemia, unspecified: Secondary | ICD-10-CM | POA: Diagnosis not present

## 2022-11-28 DIAGNOSIS — K219 Gastro-esophageal reflux disease without esophagitis: Secondary | ICD-10-CM

## 2022-11-28 DIAGNOSIS — M6282 Rhabdomyolysis: Secondary | ICD-10-CM | POA: Diagnosis not present

## 2022-11-28 DIAGNOSIS — R748 Abnormal levels of other serum enzymes: Secondary | ICD-10-CM | POA: Diagnosis not present

## 2022-11-28 LAB — CBC WITH DIFFERENTIAL/PLATELET
Basophils Absolute: 0.1 10*3/uL (ref 0.0–0.1)
Basophils Relative: 0.8 % (ref 0.0–3.0)
Eosinophils Absolute: 0.1 10*3/uL (ref 0.0–0.7)
Eosinophils Relative: 0.8 % (ref 0.0–5.0)
HCT: 36 % — ABNORMAL LOW (ref 39.0–52.0)
Hemoglobin: 11.5 g/dL — ABNORMAL LOW (ref 13.0–17.0)
Lymphocytes Relative: 18.9 % (ref 12.0–46.0)
Lymphs Abs: 1.8 10*3/uL (ref 0.7–4.0)
MCHC: 32 g/dL (ref 30.0–36.0)
MCV: 99.8 fl (ref 78.0–100.0)
Monocytes Absolute: 1.1 10*3/uL — ABNORMAL HIGH (ref 0.1–1.0)
Monocytes Relative: 11.2 % (ref 3.0–12.0)
Neutro Abs: 6.5 10*3/uL (ref 1.4–7.7)
Neutrophils Relative %: 68.3 % (ref 43.0–77.0)
Platelets: 736 10*3/uL — ABNORMAL HIGH (ref 150.0–400.0)
RBC: 3.61 Mil/uL — ABNORMAL LOW (ref 4.22–5.81)
RDW: 19 % — ABNORMAL HIGH (ref 11.5–15.5)
WBC: 9.5 10*3/uL (ref 4.0–10.5)

## 2022-11-28 LAB — BASIC METABOLIC PANEL
BUN: 20 mg/dL (ref 6–23)
CO2: 24 mEq/L (ref 19–32)
Calcium: 9.8 mg/dL (ref 8.4–10.5)
Chloride: 101 mEq/L (ref 96–112)
Creatinine, Ser: 1.01 mg/dL (ref 0.40–1.50)
GFR: 99.2 mL/min (ref >60.00)
Glucose, Bld: 110 mg/dL — ABNORMAL HIGH (ref 70–99)
Potassium: 5.1 mEq/L (ref 3.5–5.1)
Sodium: 134 mEq/L — ABNORMAL LOW (ref 135–145)

## 2022-11-28 LAB — HEPATIC FUNCTION PANEL
ALT: 52 U/L (ref 0–53)
AST: 36 U/L (ref 0–37)
Albumin: 4.3 g/dL (ref 3.5–5.2)
Alkaline Phosphatase: 107 U/L (ref 39–117)
Bilirubin, Direct: 0.2 mg/dL (ref 0.0–0.3)
Total Bilirubin: 0.5 mg/dL (ref 0.2–1.2)
Total Protein: 8.4 g/dL — ABNORMAL HIGH (ref 6.0–8.3)

## 2022-11-28 LAB — IBC PANEL
Iron: 44 ug/dL (ref 42–165)
Saturation Ratios: 9.9 % — ABNORMAL LOW (ref 20.0–50.0)
TIBC: 446.6 ug/dL (ref 250.0–450.0)
Transferrin: 319 mg/dL (ref 212.0–360.0)

## 2022-11-28 LAB — CK: Total CK: 88 U/L (ref 7–232)

## 2022-11-28 LAB — FERRITIN: Ferritin: 380.7 ng/mL — ABNORMAL HIGH (ref 22.0–322.0)

## 2022-11-28 MED ORDER — FAMOTIDINE 20 MG PO TABS: 20.0000 mg | ORAL_TABLET | Freq: Two times a day (BID) | ORAL | 3 refills | Status: DC

## 2022-11-28 NOTE — Patient Instructions (Signed)
Ok to increase the pepcid to 20 mg twice per day  Please continue all other medications as before, and refills have been done if requested.  Please have the pharmacy call with any other refills you may need.  Please continue your efforts at being more active, low cholesterol diet, and weight control.  Please keep your appointments with your specialists as you may have planned  Please go to the LAB at the blood drawing area for the tests to be done  You will be contacted by phone if any changes need to be made immediately.  Otherwise, you will receive a letter about your results with an explanation, but please check with MyChart first.  Please remember to sign up for MyChart if you have not done so, as this will be important to you in the future with finding out test results, communicating by private email, and scheduling acute appointments online when needed.  Please make an Appointment to return in 6 months, or sooner if needed

## 2022-11-28 NOTE — Progress Notes (Unsigned)
Patient ID: Harold Flores, male   DOB: 12-Oct-1990, 32 y.o.   MRN: [id number]        Chief Complaint: follow up post hospn June 3 to June 10       HPI:  Harold Flores is a 32 y.o. male here with above due to fatigue/weakness-found to have rhabdomyolysis, severe hypokalemia-subsequently admitted to the hospitalist service. Managed with supportive care-unclear why patient had a significant jump in CK levels on 6/6.  No obvious seizures-phosphorus levels were normal-unlikely to be refeeding syndrome and with further continued supportive care-CK levels trended down  Also noted to have low magnesium,, low K, requiring replacement.  Also transaminitis thought due to above, though also has significant ETOH use, but no significant w/d.  Noted to have poor med compliance with antiretrovirals, followed per ID.  Post hospn pt states overall doing well, no further ETOH since d/c.  Pt denies chest pain, increased sob or doe, wheezing, orthopnea, PND, increased LE swelling, palpitations, dizziness or syncope.   Pt denies polydipsia, polyuria, or new focal neuro s/s.    Pt denies fever, wt loss, night sweats, loss of appetite, or other constitutional symptoms  No other new complaints except for mild uncontrolled reflux, asks for increased pepcid to bid.       Wt Readings from Last 3 Encounters:  11/28/22 193 lb (87.5 kg)  11/11/22 175 lb (79.4 kg)  04/04/22 187 lb (84.8 kg)   BP Readings from Last 3 Encounters:  11/28/22 120/78  11/18/22 (!) 134/95  04/04/22 (!) 171/122         Past Medical History:  Diagnosis Date   Anal fissure    Anal fistula    Anal ulcer    Asthma    Epistaxis    GAD (generalized anxiety disorder)    GERD (gastroesophageal reflux disease)    Hemorrhoid    HIV (human immunodeficiency virus infection) (HCC)    Hypertension    Syphilis    Thrush 03/25/2022   Varicella    Past Surgical History:  Procedure Laterality Date   EYE SURGERY  1994   blephroplasty right eye    RADIOLOGY WITH ANESTHESIA N/A 08/08/2020   Procedure: MRI WITH ANESTHESIA- BRAIN WITH AND WITHOUT CONTRAST,  CERVICAL SPINE WITH AND WITHOUT CONTRAST, LUMBAR SPINE WITH WITHOUT CONTRAST, THORACIC SPINE WITH WITHOUT CONTRAST;  Surgeon: Radiologist, Medication, MD;  Location: [facility] OR;  Service: Radiology;  Laterality: N/A;    reports that he has been smoking e-cigarettes and cigarettes. He has a 2.50 pack-year smoking history. He has never used smokeless tobacco. He reports current alcohol use of about 7.0 standard drinks of alcohol per week. He reports current drug use. Drug: Marijuana. family history includes Diabetes in his maternal grandmother and mother; Heart disease in his paternal grandfather; Hyperlipidemia in his mother. No Known Allergies Current Outpatient Medications on File Prior to Visit  Medication Sig Dispense Refill   aMILoride (MIDAMOR) 5 MG tablet Take 2 tablets (10 mg total) by mouth daily. 60 tablet 0   amLODipine (NORVASC) 10 MG tablet Take 0.5 tablets (5 mg total) by mouth daily. 90 tablet 1   bictegravir-emtricitabine-tenofovir AF (BIKTARVY) 50-200-25 MG TABS tablet Take 1 tablet by mouth daily. 30 tablet 0   carvedilol (COREG) 6.25 MG tablet TAKE 1 TABLET BY MOUTH 2 (TWO) TIMES DAILY. MUST KEEP SCHEDULED APPT FOR FUTURE REFILLS 60 tablet 1   folic acid (FOLVITE) 1 MG tablet Take 2 tablets (2 mg total) by mouth daily. 60 tablet  0   loperamide (IMODIUM) 2 MG capsule Take 1 capsule (2 mg total) by mouth as needed for diarrhea or loose stools. 30 capsule 0   magnesium oxide (MAG-OX) 400 (240 Mg) MG tablet Take 1 tablet (400 mg total) by mouth 2 (two) times daily. 60 tablet 0   Multiple Vitamin (MULTIVITAMIN WITH MINERALS) TABS tablet Take 1 tablet by mouth daily.     potassium chloride SA (KLOR-CON M) 20 MEQ tablet Take 1 tablet (20 mEq total) by mouth daily. 30 tablet 0   No current facility-administered medications on file prior to visit.        ROS:  All others reviewed and  negative.  Objective        PE:  BP 120/78 (BP Location: Right Arm, Patient Position: Sitting, Cuff Size: Normal)   Pulse (!) 105   Temp 99.3 F (37.4 C) (Oral)   Ht 5\' 10"  (1.778 m)   Wt 193 lb (87.5 kg)   SpO2 98%   BMI 27.69 kg/m                 Constitutional: Pt appears in NAD               HENT: Head: NCAT.                Right Ear: External ear normal.                 Left Ear: External ear normal.                Eyes: . Pupils are equal, round, and reactive to light. Conjunctivae and EOM are normal               Nose: without d/c or deformity               Neck: Neck supple. Gross normal ROM               Cardiovascular: Normal rate and regular rhythm.                 Pulmonary/Chest: Effort normal and breath sounds without rales or wheezing.                Abd:  Soft, NT, ND, + BS, no organomegaly               Neurological: Pt is alert. At baseline orientation, motor grossly intact               Skin: Skin is warm. No rashes, no other new lesions, LE edema - none               Psychiatric: Pt behavior is normal without agitation   Micro: none  Cardiac tracings I have personally interpreted today:  none  Pertinent Radiological findings (summarize): none   Lab Results  Component Value Date   WBC 9.5 11/28/2022   HGB 11.5 (L) 11/28/2022   HCT 36.0 (L) 11/28/2022   PLT 736.0 (H) 11/28/2022   GLUCOSE 110 (H) 11/28/2022   CHOL 84 03/25/2022   TRIG 92 03/25/2022   HDL 21 (L) 03/25/2022   LDLCALC 45 03/25/2022   ALT 52 11/28/2022   AST 36 11/28/2022   NA 134 (L) 11/28/2022   K 5.1 11/28/2022   CL 101 11/28/2022   CREATININE 1.01 11/28/2022   BUN 20 11/28/2022   CO2 24 11/28/2022   TSH 1.66 11/26/2021   INR 1.2 11/11/2022   HGBA1C 5.1 11/26/2021  Assessment/Plan:  Harold Flores is a 32 y.o. Black or African American [2] male with  has a past medical history of Anal fissure, Anal fistula, Anal ulcer, Asthma, Epistaxis, GAD (generalized anxiety  disorder), GERD (gastroesophageal reflux disease), Hemorrhoid, HIV (human immunodeficiency virus infection) (HCC), Hypertension, Syphilis, Thrush (03/25/2022), and Varicella.  Hypomagnesemia Will need f//u lab  Anemia Chronic dz - for f/u cbc  Rhabdomyolysis Improved clinically, for f/u ck  Elevated liver enzymes Etiology unclear, for f/u lab  Gastroesophageal reflux disease Ok for increased pepcid to 20 bid  Followup: Return in about 6 months (around 05/30/2023).  Oliver Barre, MD 12/01/2022 7:04 AM [facility] [place] [facility] - [facility] Internal Medicine

## 2022-11-28 NOTE — Progress Notes (Signed)
The test results show that your current treatment is OK, as the tests are stable.  Please continue the same plan.  There is no other need for change of treatment or further evaluation based on these results, at this time.  thanks 

## 2022-12-01 ENCOUNTER — Encounter: Payer: Self-pay | Admitting: Internal Medicine

## 2022-12-01 NOTE — Assessment & Plan Note (Signed)
Etiology unclear, for f/u lab 

## 2022-12-01 NOTE — Assessment & Plan Note (Signed)
Will need f//u lab

## 2022-12-01 NOTE — Assessment & Plan Note (Signed)
Ok for increased pepcid to 20 bid

## 2022-12-01 NOTE — Assessment & Plan Note (Signed)
Improved clinically, for f/u ck

## 2022-12-01 NOTE — Assessment & Plan Note (Signed)
Chronic dz - for f/u cbc

## 2022-12-04 ENCOUNTER — Ambulatory Visit: Payer: PRIVATE HEALTH INSURANCE | Admitting: Infectious Diseases

## 2022-12-11 ENCOUNTER — Encounter: Payer: Self-pay | Admitting: Internal Medicine

## 2022-12-16 ENCOUNTER — Other Ambulatory Visit (HOSPITAL_COMMUNITY): Payer: Self-pay

## 2022-12-16 ENCOUNTER — Telehealth: Payer: Self-pay | Admitting: Internal Medicine

## 2022-12-16 NOTE — Telephone Encounter (Signed)
Pt is having some concerns about his blood pressure medications. Pt has upcoming appt to discuss this issue. Please advise.

## 2022-12-16 NOTE — Telephone Encounter (Signed)
I could refill these meds as he reqeusts at that pharmacy  But I dont normally rx the biktarvy  Pt was referred to Infecitious Disease in late 2023 - I can refer again if he needs to have the biktarvy monitored and prescribed long term  So just let me know thanks

## 2022-12-16 NOTE — Telephone Encounter (Signed)
Error

## 2022-12-17 ENCOUNTER — Other Ambulatory Visit (HOSPITAL_COMMUNITY): Payer: Self-pay

## 2022-12-17 ENCOUNTER — Telehealth: Payer: Self-pay | Admitting: Pharmacy Technician

## 2022-12-17 NOTE — Telephone Encounter (Signed)
[facility] Patient Product/process development scientist completed.    The patient is insured through [facility] and has a $0 copay.  We will continue to follow to see if copay assistance is needed.

## 2022-12-18 NOTE — Telephone Encounter (Signed)
Called pt he states he will be in on Monday to see MD. Will need to updated his meds when he come. Will hold on refills until he see MD.../lmb

## 2022-12-20 ENCOUNTER — Ambulatory Visit: Payer: PRIVATE HEALTH INSURANCE | Admitting: Family

## 2022-12-23 ENCOUNTER — Ambulatory Visit (INDEPENDENT_AMBULATORY_CARE_PROVIDER_SITE_OTHER): Payer: PRIVATE HEALTH INSURANCE | Admitting: Internal Medicine

## 2022-12-23 VITALS — BP 118/78 | HR 88 | Temp 99.0°F | Ht 70.0 in | Wt 206.0 lb

## 2022-12-23 DIAGNOSIS — D649 Anemia, unspecified: Secondary | ICD-10-CM | POA: Diagnosis not present

## 2022-12-23 DIAGNOSIS — Z0000 Encounter for general adult medical examination without abnormal findings: Secondary | ICD-10-CM

## 2022-12-23 DIAGNOSIS — R739 Hyperglycemia, unspecified: Secondary | ICD-10-CM | POA: Diagnosis not present

## 2022-12-23 DIAGNOSIS — E559 Vitamin D deficiency, unspecified: Secondary | ICD-10-CM

## 2022-12-23 DIAGNOSIS — M6282 Rhabdomyolysis: Secondary | ICD-10-CM

## 2022-12-23 DIAGNOSIS — Z0001 Encounter for general adult medical examination with abnormal findings: Secondary | ICD-10-CM

## 2022-12-23 DIAGNOSIS — F17200 Nicotine dependence, unspecified, uncomplicated: Secondary | ICD-10-CM

## 2022-12-23 DIAGNOSIS — I10 Essential (primary) hypertension: Secondary | ICD-10-CM

## 2022-12-23 MED ORDER — AMILORIDE HCL 5 MG PO TABS: 10.0000 mg | ORAL_TABLET | Freq: Every day | ORAL | 3 refills | Status: DC

## 2022-12-23 MED ORDER — CARVEDILOL 6.25 MG PO TABS: ORAL_TABLET | ORAL | 3 refills | Status: DC

## 2022-12-23 MED ORDER — AMLODIPINE BESYLATE 5 MG PO TABS: 5.0000 mg | ORAL_TABLET | Freq: Every day | ORAL | 3 refills | Status: DC

## 2022-12-23 NOTE — Progress Notes (Unsigned)
Patient ID: [name], male   DOB: [birth year], 32 y.o.   MRN: [id number]        Chief Complaint: follow up HTN, HLD and hyperglycemia ***       HPI:  SHANNON KIRKENDALL is a 32 y.o. male here with c/o        Wt Readings from Last 3 Encounters:  12/23/22 206 lb (93.4 kg)  11/28/22 193 lb (87.5 kg)  11/11/22 175 lb (79.4 kg)   BP Readings from Last 3 Encounters:  12/23/22 118/78  11/28/22 120/78  11/18/22 (!) 134/95         Past Medical History:  Diagnosis Date   Anal fissure    Anal fistula    Anal ulcer    Asthma    Epistaxis    GAD (generalized anxiety disorder)    GERD (gastroesophageal reflux disease)    Hemorrhoid    HIV (human immunodeficiency virus infection) (HCC)    Hypertension    Syphilis    Thrush 03/25/2022   Varicella    Past Surgical History:  Procedure Laterality Date   EYE SURGERY  1994   blephroplasty right eye   RADIOLOGY WITH ANESTHESIA N/A 08/08/2020   Procedure: MRI WITH ANESTHESIA- BRAIN WITH AND WITHOUT CONTRAST,  CERVICAL SPINE WITH AND WITHOUT CONTRAST, LUMBAR SPINE WITH WITHOUT CONTRAST, THORACIC SPINE WITH WITHOUT CONTRAST;  Surgeon: Radiologist, Medication, MD;  Location: [facility] OR;  Service: Radiology;  Laterality: N/A;    reports that he has been smoking e-cigarettes and cigarettes. He has a 2.5 pack-year smoking history. He has never used smokeless tobacco. He reports current alcohol use of about 7.0 standard drinks of alcohol per week. He reports current drug use. Drug: Marijuana. family history includes Diabetes in his maternal grandmother and mother; Heart disease in his paternal grandfather; Hyperlipidemia in his mother. No Known Allergies Current Outpatient Medications on File Prior to Visit  Medication Sig Dispense Refill   aMILoride (MIDAMOR) 5 MG tablet Take 2 tablets (10 mg total) by mouth daily. 60 tablet 0   amLODipine (NORVASC) 10 MG tablet Take 0.5 tablets (5 mg total) by mouth daily. 90 tablet 1   carvedilol (COREG) 6.25 MG  tablet TAKE 1 TABLET BY MOUTH 2 (TWO) TIMES DAILY. MUST KEEP SCHEDULED APPT FOR FUTURE REFILLS 60 tablet 1   famotidine (PEPCID) 20 MG tablet Take 1 tablet (20 mg total) by mouth 2 (two) times daily. 180 tablet 3   folic acid (FOLVITE) 1 MG tablet Take 2 tablets (2 mg total) by mouth daily. 60 tablet 0   loperamide (IMODIUM) 2 MG capsule Take 1 capsule (2 mg total) by mouth as needed for diarrhea or loose stools. 30 capsule 0   magnesium oxide (MAG-OX) 400 (240 Mg) MG tablet Take 1 tablet (400 mg total) by mouth 2 (two) times daily. 60 tablet 0   Multiple Vitamin (MULTIVITAMIN WITH MINERALS) TABS tablet Take 1 tablet by mouth daily.     potassium chloride SA (KLOR-CON M) 20 MEQ tablet Take 1 tablet (20 mEq total) by mouth daily. 30 tablet 0   No current facility-administered medications on file prior to visit.        ROS:  All others reviewed and negative.  Objective        PE:  BP 118/78 (BP Location: Left Arm, Patient Position: Sitting, Cuff Size: Normal)   Pulse 88   Temp 99 F (37.2 C) (Oral)   Ht 5\' 10"  (1.778 m)   Wt 206 lb (  93.4 kg)   SpO2 99%   BMI 29.56 kg/m                 Constitutional: Pt appears in NAD               HENT: Head: NCAT.                Right Ear: External ear normal.                 Left Ear: External ear normal.                Eyes: . Pupils are equal, round, and reactive to light. Conjunctivae and EOM are normal               Nose: without d/c or deformity               Neck: Neck supple. Gross normal ROM               Cardiovascular: Normal rate and regular rhythm.                 Pulmonary/Chest: Effort normal and breath sounds without rales or wheezing.                Abd:  Soft, NT, ND, + BS, no organomegaly               Neurological: Pt is alert. At baseline orientation, motor grossly intact               Skin: Skin is warm. No rashes, no other new lesions, LE edema - ***               Psychiatric: Pt behavior is normal without agitation    Micro: none  Cardiac tracings I have personally interpreted today:  none  Pertinent Radiological findings (summarize): none   Lab Results  Component Value Date   WBC 9.5 11/28/2022   HGB 11.5 (L) 11/28/2022   HCT 36.0 (L) 11/28/2022   PLT 736.0 (H) 11/28/2022   GLUCOSE 110 (H) 11/28/2022   CHOL 84 03/25/2022   TRIG 92 03/25/2022   HDL 21 (L) 03/25/2022   LDLCALC 45 03/25/2022   ALT 52 11/28/2022   AST 36 11/28/2022   NA 134 (L) 11/28/2022   K 5.1 11/28/2022   CL 101 11/28/2022   CREATININE 1.01 11/28/2022   BUN 20 11/28/2022   CO2 24 11/28/2022   TSH 1.66 11/26/2021   INR 1.2 11/11/2022   HGBA1C 5.1 11/26/2021   Assessment/Plan:  TRICIA OAXACA is a 32 y.o. Black or African American [2] male with  has a past medical history of Anal fissure, Anal fistula, Anal ulcer, Asthma, Epistaxis, GAD (generalized anxiety disorder), GERD (gastroesophageal reflux disease), Hemorrhoid, HIV (human immunodeficiency virus infection) (HCC), Hypertension, Syphilis, Thrush (03/25/2022), and Varicella.  No problem-specific Assessment & Plan notes found for this encounter.  Followup: No follow-ups on file.  Oliver Barre, MD 12/23/2022 9:45 AM [facility] - [facility] Internal Medicine

## 2022-12-23 NOTE — Patient Instructions (Signed)
Ok to stop the folic acid, the potassium pill, and the magnesium pill  Please continue all other medications as before, and refills have been done if requested.  Please have the pharmacy call with any other refills you may need.  Please continue your efforts at being more active, low cholesterol diet, and weight control.  You are otherwise up to date with prevention measures today.  Please keep your appointments with your specialists as you may have planned  Please come to the LAB in 3 months for repeat blood tests to make sure stopping the meds above is still ok  Please make an Appointment to return for your 1 year visit, or sooner if needed

## 2022-12-25 ENCOUNTER — Encounter: Payer: Self-pay | Admitting: Internal Medicine

## 2022-12-25 NOTE — Assessment & Plan Note (Addendum)
Age and sex appropriate education and counseling updated with regular exercise and diet Referrals for preventative services - none needed Immunizations addressed - none needed Smoking counseling  - pt counsled to quit, pt not ready Evidence for depression or other mood disorder - none significant Most recent labs reviewed. I have personally reviewed and have noted: 1) the patient's medical and social history 2) The patient's current medications and supplements 3) The patient's height, weight, and BMI have been recorded in the chart

## 2022-12-25 NOTE — Assessment & Plan Note (Signed)
Last vitamin D Lab Results  Component Value Date   VD25OH 23.06 (L) 11/26/2021   Low, to start oral replacement

## 2022-12-25 NOTE — Assessment & Plan Note (Signed)
 Pt counsled to quit, pt not ready °

## 2022-12-25 NOTE — Assessment & Plan Note (Signed)
Also for f/u magnesium with labs

## 2022-12-25 NOTE — Assessment & Plan Note (Signed)
BP Readings from Last 3 Encounters:  12/23/22 118/78  11/28/22 120/78  11/18/22 (!) 134/95   Improved now low normal,, pt to continue medical treatment coreg 6.25 bid, but ok to decrease the norvasc to 5 mg qd

## 2022-12-25 NOTE — Assessment & Plan Note (Signed)
Also for f/u CPK wth labs

## 2023-02-12 ENCOUNTER — Encounter: Payer: Self-pay | Admitting: Podiatry

## 2023-02-12 ENCOUNTER — Ambulatory Visit (INDEPENDENT_AMBULATORY_CARE_PROVIDER_SITE_OTHER): Payer: Medicaid Other | Admitting: Podiatry

## 2023-02-12 DIAGNOSIS — L600 Ingrowing nail: Secondary | ICD-10-CM | POA: Diagnosis not present

## 2023-02-12 MED ORDER — DOXYCYCLINE HYCLATE 100 MG PO TABS
100.0000 mg | ORAL_TABLET | Freq: Two times a day (BID) | ORAL | 0 refills | Status: DC
Start: 2023-02-12 — End: 2023-10-21

## 2023-02-12 NOTE — Progress Notes (Signed)
   Chief Complaint  Patient presents with   Ingrown Toenail    RM 9: np left great toenail ingrown    Subjective: Patient presents today for evaluation of pain to the lateral border left great toe. Patient is concerned for possible ingrown nail.  It is very sensitive to touch.  Patient presents today for further treatment and evaluation.  Past Medical History:  Diagnosis Date   Anal fissure    Anal fistula    Anal ulcer    Asthma    Epistaxis    GAD (generalized anxiety disorder)    GERD (gastroesophageal reflux disease)    Hemorrhoid    HIV (human immunodeficiency virus infection) (HCC)    Hypertension    Syphilis    Thrush 03/25/2022   Varicella     Past Surgical History:  Procedure Laterality Date   EYE SURGERY  1994   blephroplasty right eye   RADIOLOGY WITH ANESTHESIA N/A 08/08/2020   Procedure: MRI WITH ANESTHESIA- BRAIN WITH AND WITHOUT CONTRAST,  CERVICAL SPINE WITH AND WITHOUT CONTRAST, LUMBAR SPINE WITH WITHOUT CONTRAST, THORACIC SPINE WITH WITHOUT CONTRAST;  Surgeon: Radiologist, Medication, MD;  Location: [facility] OR;  Service: Radiology;  Laterality: N/A;    No Known Allergies  Objective:  General: Well developed, nourished, in no acute distress, alert and oriented x3   Dermatology: Skin is warm, dry and supple bilateral.  Lateral border left great toe is tender with evidence of an ingrowing nail. Pain on palpation noted to the border of the nail fold. The remaining nails appear unremarkable at this time.   Vascular: DP and PT pulses palpable.  No clinical evidence of vascular compromise  Neruologic: Grossly intact via light touch bilateral.  Musculoskeletal: No pedal deformity noted  Assesement: #1 Paronychia with ingrowing nail lateral border left great toe  Plan of Care:  -Patient evaluated.  -Discussed treatment alternatives and plan of care. Explained nail avulsion procedure and post procedure course to patient. -Patient opted for permanent partial nail  avulsion of the ingrown portion of the nail.  -Prior to procedure, local anesthesia infiltration utilized using 3 ml of a 50:50 mixture of 2% plain lidocaine and 0.5% plain marcaine in a normal hallux block fashion and a betadine prep performed.  -Partial permanent nail avulsion with chemical matrixectomy performed using 3x30sec applications of phenol followed by alcohol flush.  -Light dressing applied.  Post care instructions provided -Return to clinic 3 weeks  Felecia Shelling, DPM [facility]  Dr. Felecia Shelling, DPM    [address] [place], [place] [address]                Office [phone]  Fax [phone]

## 2023-02-22 IMAGING — CT CT ANGIO CHEST
3 of 7 series · 18 of 46 positions shown · IV contrast (OMNIPAQUE)
Comparison: Chest radiograph August 03, 2020.

CLINICAL DATA: Tech cardia, elevated troponins, lower extremity
edema PE suspected.

EXAM:
CT ANGIOGRAPHY CHEST WITH CONTRAST
TECHNIQUE: Multidetector CT imaging of the chest was performed using the
standard protocol during bolus administration of intravenous
contrast. Multiplanar CT image reconstructions and MIPs were
obtained to evaluate the vascular anatomy.
CONTRAST:  100mL OMNIPAQUE IOHEXOL 350 MG/ML SOLN

[Series 5: thins · axial · 0.74mm/px · z∈[+1500,+1752]mm · 15 of 290 slices shown]
[im 19/290  lung]
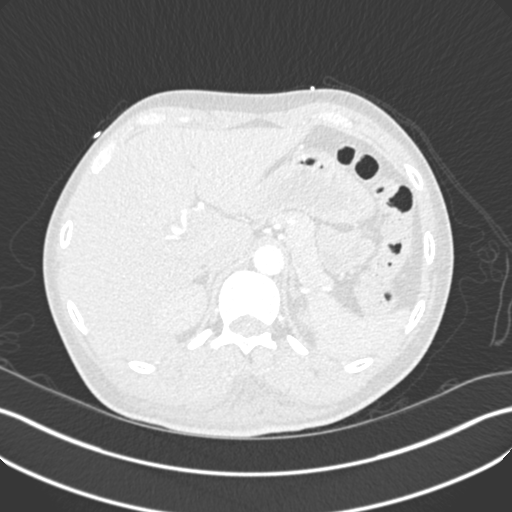
[im 37/290  soft-tissue]
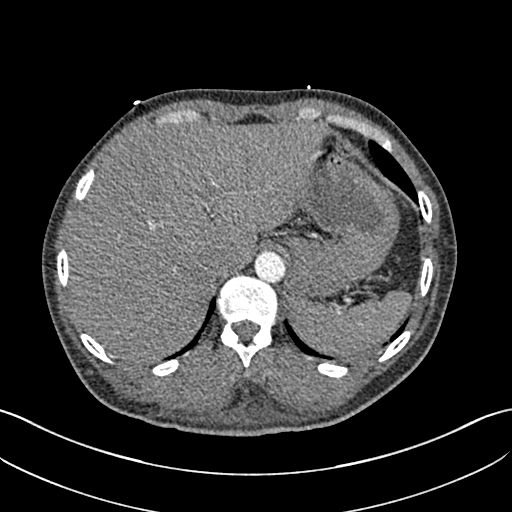
[im 55/290  lung]
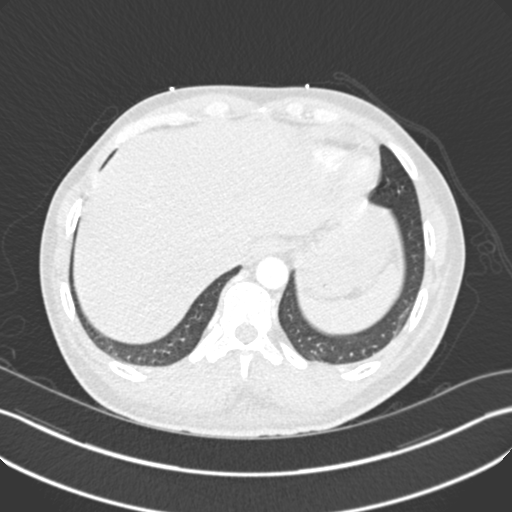
[im 73/290  soft-tissue]
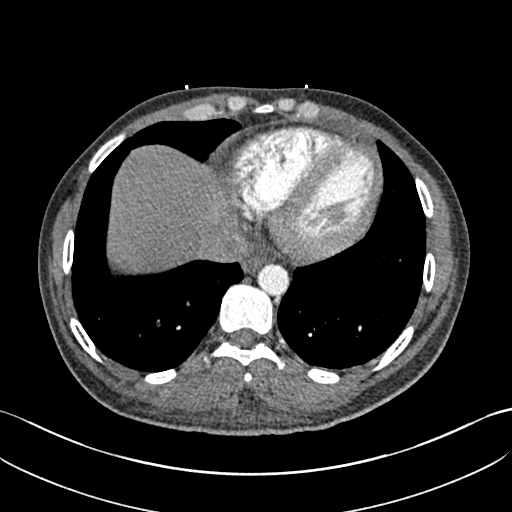
[im 91/290  lung]
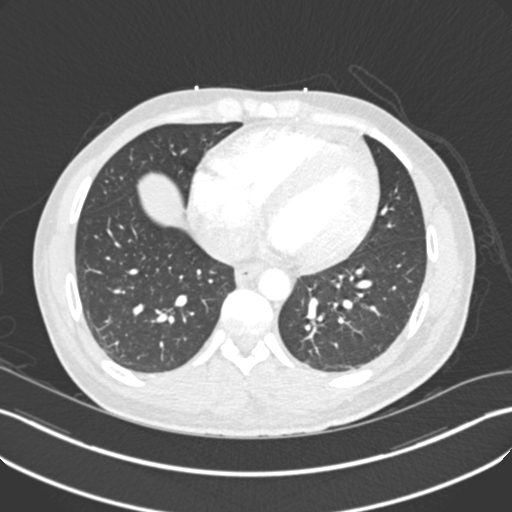
[im 109/290  soft-tissue]
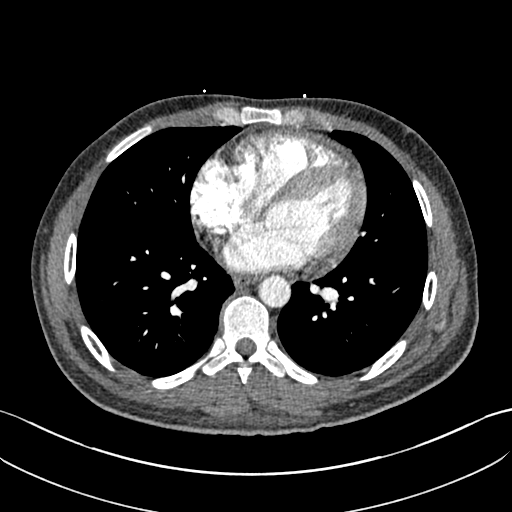
[im 127/290  lung]
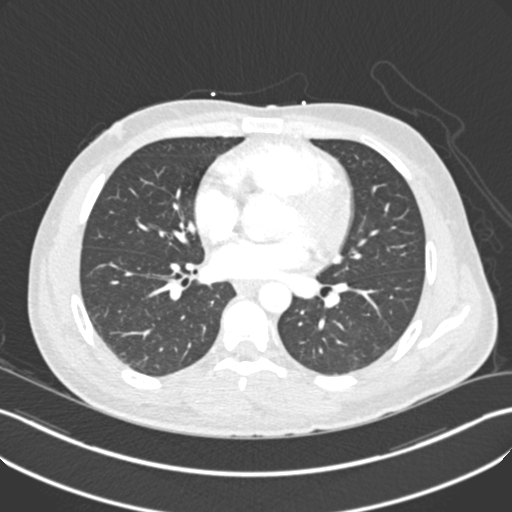
[im 145/290  soft-tissue]
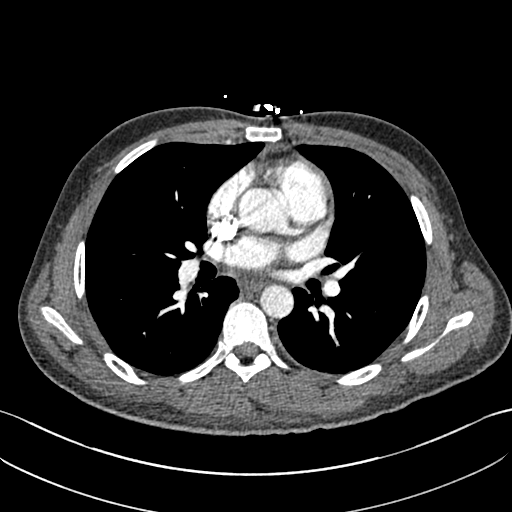
[im 163/290  lung]
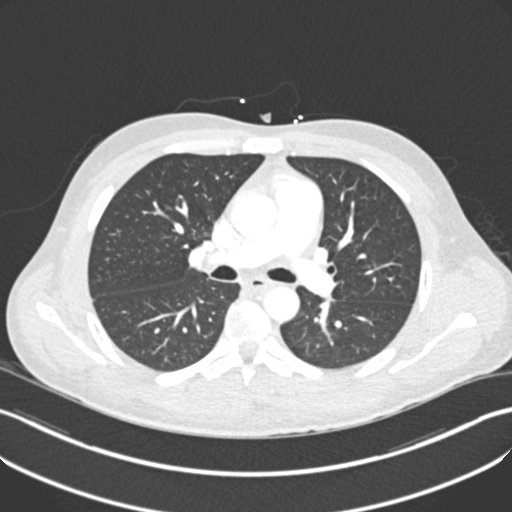
[im 181/290  soft-tissue]
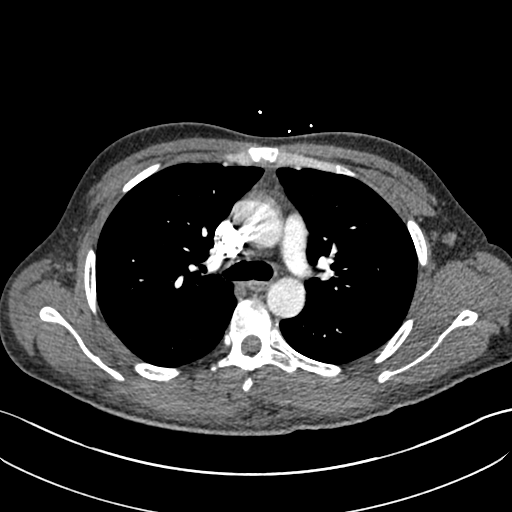
[im 199/290  lung]
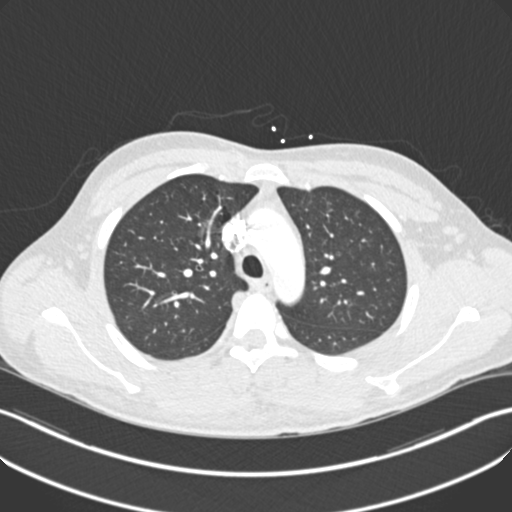
[im 217/290  soft-tissue]
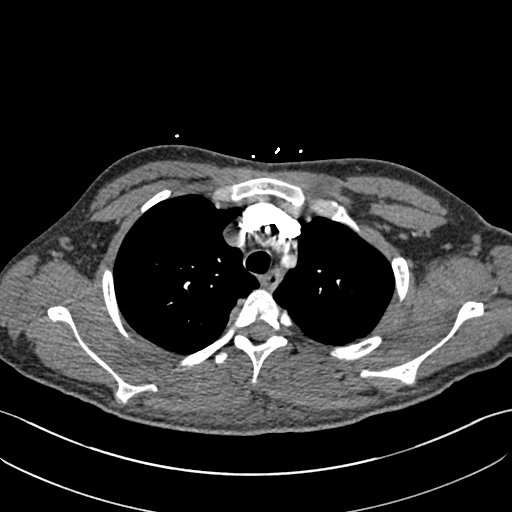
[im 235/290  lung]
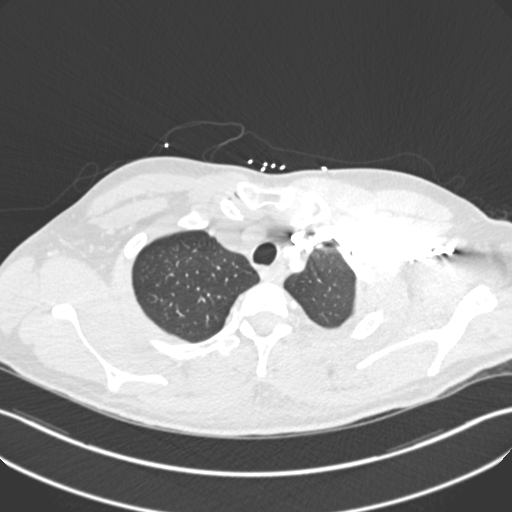
[im 253/290  soft-tissue]
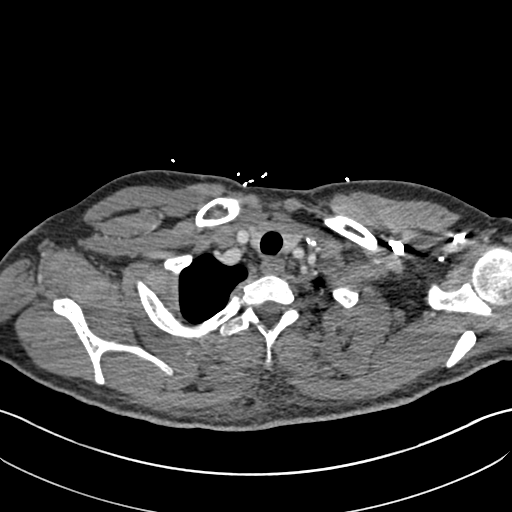
[im 271/290  lung]
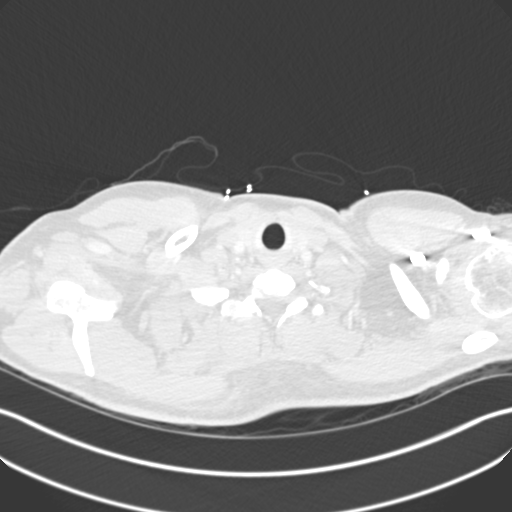

[Series 6: lung · axial · 0.74mm/px · 1 of 145 slices shown]
[im 19/145  soft-tissue]
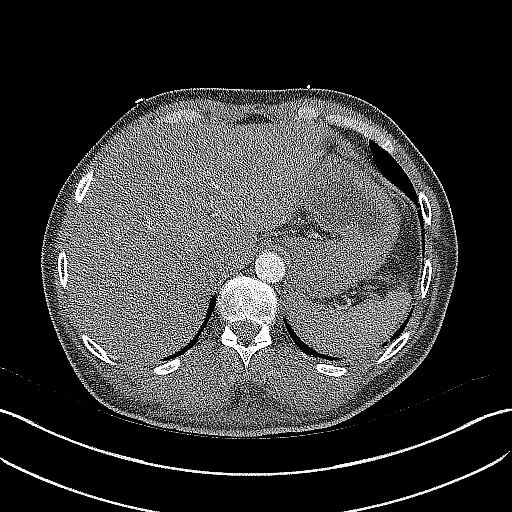

[Series 7: coronal mpr · coronal · 0.57mm/px · 2 of 87 slices shown]
[im 29/87  soft-tissue]
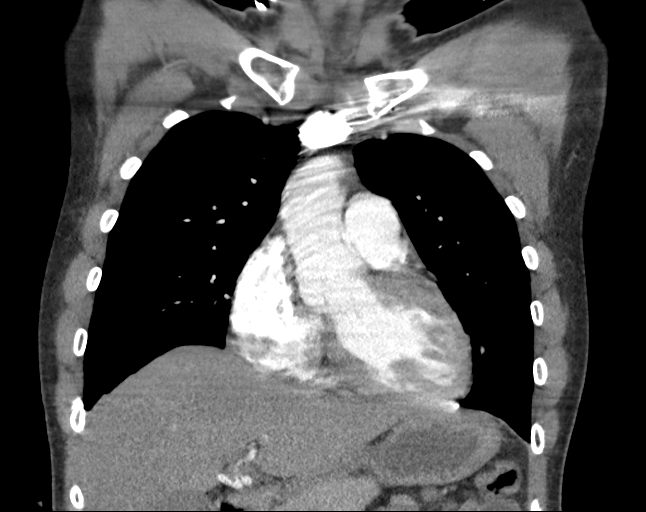
[im 58/87  soft-tissue]
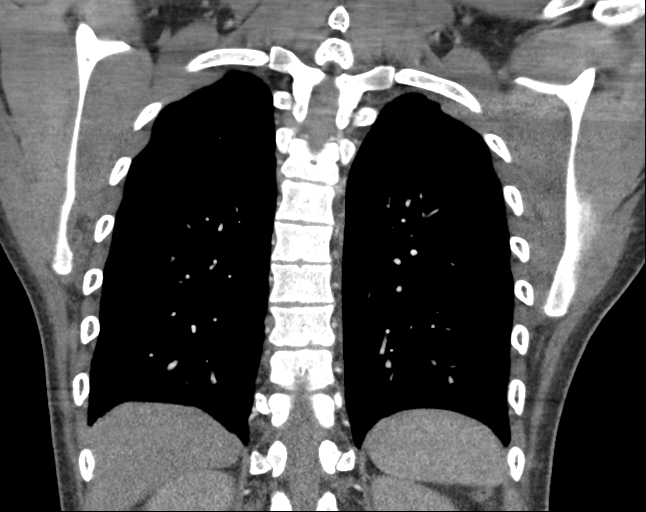

[18 of 46 positions shown; findings below may reference images not displayed]

FINDINGS: Cardiovascular: Satisfactory opacification of the pulmonary arteries
to the segmental level. No evidence of pulmonary embolism. Normal
heart size. No pericardial effusion.

Mediastinum/Nodes: No mediastinal, hilar or axillary
lymphadenopathy. Thyroid glands unremarkable. Esophagus and trachea
are unremarkable.

Lungs/Pleura: There are a few ground-glass nodular opacities in the
right upper lobe for instance on image 50/6 and 48/6. No lobar
consolidations. No pleural effusion. No pneumothorax.

Upper Abdomen: No acute abnormality.

Musculoskeletal: No chest wall abnormality. No acute or significant
osseous findings.

Review of the MIP images confirms the above findings.
IMPRESSION: 1. No evidence of acute pulmonary embolism.
2. Few ground-glass nodular opacities in the right upper lobe, which
may reflect an infectious/inflammatory process.

## 2023-03-10 ENCOUNTER — Ambulatory Visit (INDEPENDENT_AMBULATORY_CARE_PROVIDER_SITE_OTHER): Payer: Medicaid Other | Admitting: Podiatry

## 2023-03-10 DIAGNOSIS — Z91199 Patient's noncompliance with other medical treatment and regimen due to unspecified reason: Secondary | ICD-10-CM

## 2023-03-10 NOTE — Progress Notes (Signed)
No show

## 2023-04-11 ENCOUNTER — Other Ambulatory Visit: Payer: Self-pay | Admitting: Infectious Disease

## 2023-04-11 DIAGNOSIS — Z21 Asymptomatic human immunodeficiency virus [HIV] infection status: Secondary | ICD-10-CM

## 2023-04-14 ENCOUNTER — Telehealth: Payer: Self-pay

## 2023-04-14 NOTE — Telephone Encounter (Signed)
Patient needs appointment, last seen 04/04/22, Biktarvy last filled 02/03/23.  Called and LVM.   Sandie Ano, RN

## 2023-04-14 NOTE — Telephone Encounter (Signed)
Received refill request for patient's Biktarvy, he is overdue for appointment. Called to schedule, no answer. Left HIPAA compliant voicemail requesting callback.   Sandie Ano, RN

## 2023-04-16 NOTE — Telephone Encounter (Signed)
Second attempt to reach patient to schedule follow up. Will send mychart message. Juanita Laster, RMA

## 2023-04-16 NOTE — Telephone Encounter (Signed)
2nd attempt to reach pt. Mychart message sent

## 2023-04-17 NOTE — Telephone Encounter (Signed)
Medication last dispensed in August. Patient needs appointment.   Sandie Ano, RN

## 2023-05-30 ENCOUNTER — Ambulatory Visit: Payer: PRIVATE HEALTH INSURANCE | Admitting: Internal Medicine

## 2023-10-15 ENCOUNTER — Telehealth: Payer: Self-pay

## 2023-10-15 NOTE — Telephone Encounter (Signed)
 Patient considered out of care.  Last [facility] Visit: 04-04-22  Last HIV Viral Load:  HIV 1 RNA Quant  Date Value Ref Range Status  11/12/2022 <20 copies/mL Corrected    Comment:    (NOTE) HIV-1 RNA not detected The reportable range for this assay is 20 to 10,000,000 copies HIV-1 RNA/mL. Performed At: [facility] [address] [place], [place] [id number] Pearlean Botts MD EX:[id number]     Last CD4 Count:  CD4 T Cell Abs  Date Value Ref Range Status  04/04/2022 226 (L) 400 - 1,790 /uL Final    Medication Dispense History:   Dispensed Days Supply Quantity Provider Pharmacy  BIKTARVY  50/200/25MG  TABLETS 02/03/2023 30 30 each Ernie Heal, Jerelyn Money, MD [facility]...  BIKTARVY  50/200/25MG  TABLETS 12/24/2022 30 30 each Ernie Heal, Jerelyn Money, MD [facility]...  bictegravir-emtricitabine -tenofovir  AF (BIKTARVY ) 50-200-25 MG TABS tablet 11/12/2022 30 30 tablet Ernie Heal, Jerelyn Money, MD [facility] Trans[facility]...  BIKTARVY  50/200/25MG  TABLETS 07/26/2022 90 90 each Ernie Heal, Jerelyn Money, MD [facility]...    Interventions: called [name] to schedule follow up appointment, no answer. Left HIPAA compliant voicemail requesting callback. . Will attempt to reach via MyChart as well.   [name], BSN, RN

## 2023-10-21 ENCOUNTER — Emergency Department (HOSPITAL_COMMUNITY)

## 2023-10-21 ENCOUNTER — Other Ambulatory Visit: Payer: Self-pay

## 2023-10-21 ENCOUNTER — Encounter (HOSPITAL_COMMUNITY): Payer: Self-pay | Admitting: Emergency Medicine

## 2023-10-21 ENCOUNTER — Inpatient Hospital Stay (HOSPITAL_COMMUNITY)
Admission: EM | Admit: 2023-10-21 | Discharge: 2023-10-27 | DRG: 897 | Disposition: A | Discharge status: Home / Self Care | Attending: Internal Medicine | Admitting: Internal Medicine

## 2023-10-21 DIAGNOSIS — I10 Essential (primary) hypertension: Secondary | ICD-10-CM | POA: Diagnosis present

## 2023-10-21 DIAGNOSIS — E861 Hypovolemia: Secondary | ICD-10-CM | POA: Diagnosis present

## 2023-10-21 DIAGNOSIS — L8990 Pressure ulcer of unspecified site, unspecified stage: Secondary | ICD-10-CM | POA: Insufficient documentation

## 2023-10-21 DIAGNOSIS — D539 Nutritional anemia, unspecified: Secondary | ICD-10-CM | POA: Diagnosis present

## 2023-10-21 DIAGNOSIS — Z6827 Body mass index (BMI) 27.0-27.9, adult: Secondary | ICD-10-CM

## 2023-10-21 DIAGNOSIS — B20 Human immunodeficiency virus [HIV] disease: Secondary | ICD-10-CM | POA: Diagnosis present

## 2023-10-21 DIAGNOSIS — F17210 Nicotine dependence, cigarettes, uncomplicated: Secondary | ICD-10-CM | POA: Diagnosis present

## 2023-10-21 DIAGNOSIS — K529 Noninfective gastroenteritis and colitis, unspecified: Secondary | ICD-10-CM

## 2023-10-21 DIAGNOSIS — R112 Nausea with vomiting, unspecified: Secondary | ICD-10-CM

## 2023-10-21 DIAGNOSIS — K219 Gastro-esophageal reflux disease without esophagitis: Secondary | ICD-10-CM | POA: Diagnosis present

## 2023-10-21 DIAGNOSIS — F10139 Alcohol abuse with withdrawal, unspecified: Secondary | ICD-10-CM | POA: Diagnosis not present

## 2023-10-21 DIAGNOSIS — Z8349 Family history of other endocrine, nutritional and metabolic diseases: Secondary | ICD-10-CM

## 2023-10-21 DIAGNOSIS — Z833 Family history of diabetes mellitus: Secondary | ICD-10-CM

## 2023-10-21 DIAGNOSIS — D6959 Other secondary thrombocytopenia: Secondary | ICD-10-CM | POA: Diagnosis present

## 2023-10-21 DIAGNOSIS — E871 Hypo-osmolality and hyponatremia: Secondary | ICD-10-CM | POA: Diagnosis present

## 2023-10-21 DIAGNOSIS — Z8249 Family history of ischemic heart disease and other diseases of the circulatory system: Secondary | ICD-10-CM

## 2023-10-21 DIAGNOSIS — F17290 Nicotine dependence, other tobacco product, uncomplicated: Secondary | ICD-10-CM | POA: Diagnosis present

## 2023-10-21 DIAGNOSIS — F10939 Alcohol use, unspecified with withdrawal, unspecified: Secondary | ICD-10-CM | POA: Diagnosis present

## 2023-10-21 DIAGNOSIS — D696 Thrombocytopenia, unspecified: Secondary | ICD-10-CM | POA: Diagnosis not present

## 2023-10-21 DIAGNOSIS — F411 Generalized anxiety disorder: Secondary | ICD-10-CM | POA: Diagnosis present

## 2023-10-21 DIAGNOSIS — F10930 Alcohol use, unspecified with withdrawal, uncomplicated: Secondary | ICD-10-CM | POA: Diagnosis not present

## 2023-10-21 DIAGNOSIS — F10239 Alcohol dependence with withdrawal, unspecified: Principal | ICD-10-CM | POA: Diagnosis present

## 2023-10-21 DIAGNOSIS — B370 Candidal stomatitis: Secondary | ICD-10-CM | POA: Diagnosis present

## 2023-10-21 DIAGNOSIS — E876 Hypokalemia: Secondary | ICD-10-CM | POA: Diagnosis present

## 2023-10-21 DIAGNOSIS — E7220 Disorder of urea cycle metabolism, unspecified: Secondary | ICD-10-CM | POA: Diagnosis not present

## 2023-10-21 DIAGNOSIS — R627 Adult failure to thrive: Secondary | ICD-10-CM | POA: Diagnosis present

## 2023-10-21 DIAGNOSIS — L89312 Pressure ulcer of right buttock, stage 2: Secondary | ICD-10-CM | POA: Diagnosis present

## 2023-10-21 DIAGNOSIS — K6289 Other specified diseases of anus and rectum: Secondary | ICD-10-CM | POA: Diagnosis present

## 2023-10-21 DIAGNOSIS — K7010 Alcoholic hepatitis without ascites: Secondary | ICD-10-CM

## 2023-10-21 DIAGNOSIS — I4729 Other ventricular tachycardia: Secondary | ICD-10-CM | POA: Diagnosis not present

## 2023-10-21 DIAGNOSIS — Z21 Asymptomatic human immunodeficiency virus [HIV] infection status: Principal | ICD-10-CM

## 2023-10-21 DIAGNOSIS — Z79899 Other long term (current) drug therapy: Secondary | ICD-10-CM

## 2023-10-21 DIAGNOSIS — L299 Pruritus, unspecified: Secondary | ICD-10-CM | POA: Diagnosis present

## 2023-10-21 DIAGNOSIS — I472 Ventricular tachycardia, unspecified: Secondary | ICD-10-CM | POA: Diagnosis not present

## 2023-10-21 DIAGNOSIS — Z91148 Patient's other noncompliance with medication regimen for other reason: Secondary | ICD-10-CM

## 2023-10-21 LAB — CBC WITH DIFFERENTIAL/PLATELET
Abs Immature Granulocytes: 0.04 10*3/uL (ref 0.00–0.07)
Basophils Absolute: 0 10*3/uL (ref 0.0–0.1)
Basophils Relative: 0 %
Eosinophils Absolute: 0 10*3/uL (ref 0.0–0.5)
Eosinophils Relative: 0 %
HCT: 34.4 % — ABNORMAL LOW (ref 39.0–52.0)
Hemoglobin: 12.9 g/dL — ABNORMAL LOW (ref 13.0–17.0)
Immature Granulocytes: 0 %
Lymphocytes Relative: 26 %
Lymphs Abs: 2.5 10*3/uL (ref 0.7–4.0)
MCH: 26.8 pg (ref 26.0–34.0)
MCHC: 37.5 g/dL — ABNORMAL HIGH (ref 30.0–36.0)
MCV: 71.5 fL — ABNORMAL LOW (ref 80.0–100.0)
Monocytes Absolute: 0.9 10*3/uL (ref 0.1–1.0)
Monocytes Relative: 9 %
Neutro Abs: 6.3 10*3/uL (ref 1.7–7.7)
Neutrophils Relative %: 65 %
Platelets: 65 10*3/uL — ABNORMAL LOW (ref 150–400)
RBC: 4.81 MIL/uL (ref 4.22–5.81)
RDW: 13.2 % (ref 11.5–15.5)
WBC: 9.7 10*3/uL (ref 4.0–10.5)
nRBC: 0 % (ref 0.0–0.2)

## 2023-10-21 LAB — COMPREHENSIVE METABOLIC PANEL WITH GFR
ALT: 212 U/L — ABNORMAL HIGH (ref 0–44)
AST: 486 U/L — ABNORMAL HIGH (ref 15–41)
Albumin: 2.3 g/dL — ABNORMAL LOW (ref 3.5–5.0)
Alkaline Phosphatase: 184 U/L — ABNORMAL HIGH (ref 38–126)
Anion gap: 22 — ABNORMAL HIGH (ref 5–15)
BUN: 5 mg/dL — ABNORMAL LOW (ref 6–20)
CO2: 24 mmol/L (ref 22–32)
Calcium: 6.8 mg/dL — ABNORMAL LOW (ref 8.9–10.3)
Chloride: 79 mmol/L — ABNORMAL LOW (ref 98–111)
Creatinine, Ser: 0.89 mg/dL (ref 0.61–1.24)
GFR, Estimated: >60 mL/min (ref >60)
Glucose, Bld: 103 mg/dL — ABNORMAL HIGH (ref 70–99)
Potassium: 3.1 mmol/L — ABNORMAL LOW (ref 3.5–5.1)
Sodium: 125 mmol/L — ABNORMAL LOW (ref 135–145)
Total Bilirubin: 5.8 mg/dL — ABNORMAL HIGH (ref 0.0–1.2)
Total Protein: 6.7 g/dL (ref 6.5–8.1)

## 2023-10-21 LAB — CBC
HCT: 33.4 % — ABNORMAL LOW (ref 39.0–52.0)
Hemoglobin: 12.3 g/dL — ABNORMAL LOW (ref 13.0–17.0)
MCH: 26.3 pg (ref 26.0–34.0)
MCHC: 36.8 g/dL — ABNORMAL HIGH (ref 30.0–36.0)
MCV: 71.5 fL — ABNORMAL LOW (ref 80.0–100.0)
Platelets: 62 10*3/uL — ABNORMAL LOW (ref 150–400)
RBC: 4.67 MIL/uL (ref 4.22–5.81)
RDW: 13.6 % (ref 11.5–15.5)
WBC: 10.6 10*3/uL — ABNORMAL HIGH (ref 4.0–10.5)
nRBC: 0 % (ref 0.0–0.2)

## 2023-10-21 LAB — PHOSPHORUS: Phosphorus: 6 mg/dL — ABNORMAL HIGH (ref 2.5–4.6)

## 2023-10-21 LAB — CBG MONITORING, ED: Glucose-Capillary: 109 mg/dL — ABNORMAL HIGH (ref 70–99)

## 2023-10-21 LAB — CK: Total CK: 312 U/L (ref 49–397)

## 2023-10-21 LAB — CREATININE, SERUM
Creatinine, Ser: 0.78 mg/dL (ref 0.61–1.24)
GFR, Estimated: >60 mL/min (ref >60)

## 2023-10-21 LAB — MAGNESIUM: Magnesium: 0.7 mg/dL — CL (ref 1.7–2.4)

## 2023-10-21 MED ORDER — ONDANSETRON HCL 4 MG/2ML IJ SOLN
4.0000 mg | Freq: Once | INTRAMUSCULAR | Status: AC
  Administered 2023-10-21: 4 mg via INTRAVENOUS
  Filled 2023-10-21: qty 2

## 2023-10-21 MED ORDER — IOHEXOL 350 MG/ML SOLN
75.0000 mL | Freq: Once | INTRAVENOUS | Status: AC | PRN
  Administered 2023-10-21: 75 mL via INTRAVENOUS

## 2023-10-21 MED ORDER — BICTEGRAVIR-EMTRICITAB-TENOFOV 50-200-25 MG PO TABS
1.0000 | ORAL_TABLET | Freq: Every day | ORAL | Status: DC
  Administered 2023-10-21 – 2023-10-27 (×7): 1 via ORAL
  Filled 2023-10-21 (×7): qty 1

## 2023-10-21 MED ORDER — THIAMINE MONONITRATE 100 MG PO TABS
100.0000 mg | ORAL_TABLET | Freq: Every day | ORAL | Status: DC
  Administered 2023-10-21 – 2023-10-27 (×7): 100 mg via ORAL
  Filled 2023-10-21 (×7): qty 1

## 2023-10-21 MED ORDER — SODIUM CHLORIDE 0.9 % IV BOLUS
1000.0000 mL | Freq: Once | INTRAVENOUS | Status: AC
  Administered 2023-10-21: 1000 mL via INTRAVENOUS

## 2023-10-21 MED ORDER — MAGNESIUM SULFATE 2 GM/50ML IV SOLN
2.0000 g | Freq: Once | INTRAVENOUS | Status: AC
  Administered 2023-10-21: 2 g via INTRAVENOUS
  Filled 2023-10-21: qty 50

## 2023-10-21 MED ORDER — CARVEDILOL 3.125 MG PO TABS
6.2500 mg | ORAL_TABLET | Freq: Two times a day (BID) | ORAL | Status: DC
Start: 2023-10-21 — End: 2023-10-21

## 2023-10-21 MED ORDER — FAMOTIDINE 20 MG PO TABS
20.0000 mg | ORAL_TABLET | Freq: Two times a day (BID) | ORAL | Status: DC
  Administered 2023-10-21 – 2023-10-27 (×12): 20 mg via ORAL
  Filled 2023-10-21 (×12): qty 1

## 2023-10-21 MED ORDER — SODIUM CHLORIDE 0.9 % IV SOLN
2.0000 g | Freq: Once | INTRAVENOUS | Status: AC
  Administered 2023-10-21: 2 g via INTRAVENOUS
  Filled 2023-10-21: qty 20

## 2023-10-21 MED ORDER — LORAZEPAM 1 MG PO TABS
1.0000 mg | ORAL_TABLET | Freq: Once | ORAL | Status: AC
  Administered 2023-10-21: 1 mg via ORAL
  Filled 2023-10-21: qty 1

## 2023-10-21 MED ORDER — CARVEDILOL 6.25 MG PO TABS
6.2500 mg | ORAL_TABLET | Freq: Two times a day (BID) | ORAL | Status: DC
  Administered 2023-10-21 – 2023-10-27 (×11): 6.25 mg via ORAL
  Filled 2023-10-21: qty 2
  Filled 2023-10-21 (×11): qty 1

## 2023-10-21 MED ORDER — ADULT MULTIVITAMIN W/MINERALS CH
1.0000 | ORAL_TABLET | Freq: Every day | ORAL | Status: DC
  Administered 2023-10-21: 1 via ORAL
  Filled 2023-10-21: qty 1

## 2023-10-21 MED ORDER — FOLIC ACID 1 MG PO TABS
1.0000 mg | ORAL_TABLET | Freq: Every day | ORAL | Status: DC
  Administered 2023-10-21 – 2023-10-27 (×7): 1 mg via ORAL
  Filled 2023-10-21 (×7): qty 1

## 2023-10-21 MED ORDER — THIAMINE MONONITRATE 100 MG PO TABS: 100.0000 mg | ORAL_TABLET | Freq: Every day | ORAL | Status: DC

## 2023-10-21 MED ORDER — POTASSIUM CHLORIDE 20 MEQ PO PACK
40.0000 meq | PACK | Freq: Once | ORAL | Status: AC
  Administered 2023-10-21: 40 meq via ORAL
  Filled 2023-10-21: qty 2

## 2023-10-21 MED ORDER — FOLIC ACID 1 MG PO TABS: 1.0000 mg | ORAL_TABLET | Freq: Every day | ORAL | Status: DC

## 2023-10-21 MED ORDER — LORAZEPAM 2 MG/ML IJ SOLN
1.0000 mg | INTRAMUSCULAR | Status: AC | PRN
  Administered 2023-10-21 – 2023-10-22 (×3): 2 mg via INTRAVENOUS
  Administered 2023-10-22: 1 mg via INTRAVENOUS
  Filled 2023-10-21 (×4): qty 1

## 2023-10-21 MED ORDER — ADULT MULTIVITAMIN W/MINERALS CH: 1.0000 | ORAL_TABLET | Freq: Every day | ORAL | Status: DC

## 2023-10-21 MED ORDER — LORAZEPAM 1 MG PO TABS
1.0000 mg | ORAL_TABLET | ORAL | Status: AC | PRN
  Administered 2023-10-22 – 2023-10-23 (×2): 2 mg via ORAL
  Administered 2023-10-24: 1 mg via ORAL
  Filled 2023-10-21: qty 1
  Filled 2023-10-21 (×2): qty 2

## 2023-10-21 MED ORDER — ENOXAPARIN SODIUM 40 MG/0.4ML IJ SOSY
40.0000 mg | PREFILLED_SYRINGE | INTRAMUSCULAR | Status: DC
  Administered 2023-10-21: 40 mg via SUBCUTANEOUS
  Filled 2023-10-21: qty 0.4

## 2023-10-21 MED ORDER — METRONIDAZOLE 500 MG/100ML IV SOLN
500.0000 mg | Freq: Two times a day (BID) | INTRAVENOUS | Status: DC
  Administered 2023-10-21 – 2023-10-23 (×4): 500 mg via INTRAVENOUS
  Filled 2023-10-21 (×4): qty 100

## 2023-10-21 MED ORDER — ORAL CARE MOUTH RINSE: 15.0000 mL | OROMUCOSAL | Status: DC | PRN

## 2023-10-21 MED ORDER — THIAMINE HCL 100 MG/ML IJ SOLN
100.0000 mg | Freq: Every day | INTRAMUSCULAR | Status: DC
  Filled 2023-10-21: qty 2

## 2023-10-21 MED ORDER — AMLODIPINE BESYLATE 5 MG PO TABS
5.0000 mg | ORAL_TABLET | Freq: Every day | ORAL | Status: DC
  Filled 2023-10-21: qty 1

## 2023-10-21 NOTE — ED Triage Notes (Signed)
 Pt BIBA from home w/ c/o vomiting/shakiness/chills. Pt stated that he has not been feeling good for the past couple of days. EMS stated that he has been tachy in the 140s and has not been able to take daily medications. Pt appears jaundice but denies liver issues. States that he does drink and has been drinking this week. A&O x4

## 2023-10-21 NOTE — ED Notes (Signed)
 Spoke with Lavonia Powers, RN

## 2023-10-21 NOTE — Progress Notes (Signed)
 CSW added substance abuse resources to patient's AVS.  Edwin Dada, MSW, LCSW Transitions of Care  Clinical Social Worker II [phone]

## 2023-10-21 NOTE — H&P (Addendum)
 History and Physical    Patient: Harold Flores ZOX:[id number] DOB: 1991-01-14 DOA: 10/21/2023 DOS: the patient was seen and examined on 10/21/2023 PCP: Roslyn Coombe, MD  Patient coming from: Home  Chief Complaint:  Chief Complaint  Patient presents with   Emesis   HPI: Harold Flores is a 33 y.o. male with medical history significant of HTN, HIV, GERD, and GAD p/w FTT c/b n/v/d x1 week iso EtOH withdrawal.  Pt in his USOH until this past week when he began to have intractable n/v. Pt states that since October 2024 he has been drink a pint of vodka or some other liquor daily. He attributes life stressors to the reasons for his drinking. He has had intermittent vomiting since October, but his vomiting has persisted for the past week, and he reports being unable to eat food or take any medications during that time. 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  In the ED, the patient was noted to be visibly shaking, tachycardic and actively withdrawing from EtOH. He was admitted to medicine for ongoing care.  Review of Systems: As mentioned in the history of present illness. All other systems reviewed and are negative. Past Medical History:  Diagnosis Date   Anal fissure    Anal fistula    Anal ulcer    Asthma    Epistaxis    GAD (generalized anxiety disorder)    GERD (gastroesophageal reflux disease)    Hemorrhoid    HIV (human immunodeficiency virus infection) (HCC)    Hypertension    Syphilis    Thrush 03/25/2022   Varicella    Past Surgical History:  Procedure Laterality Date   EYE SURGERY  1994   blephroplasty right eye   RADIOLOGY WITH ANESTHESIA N/A 08/08/2020   Procedure: MRI WITH ANESTHESIA- BRAIN WITH AND WITHOUT CONTRAST,  CERVICAL SPINE WITH AND WITHOUT CONTRAST, LUMBAR SPINE WITH WITHOUT CONTRAST, THORACIC SPINE WITH WITHOUT CONTRAST;  Surgeon: Radiologist, Medication, MD;  Location: [facility] OR;  Service: Radiology;  Laterality: N/A;    Social History:  reports that he has been smoking e-cigarettes and cigarettes. He has a 2.5 pack-year smoking history. He has never used smokeless tobacco. He reports current alcohol  use of about 7.0 standard drinks of alcohol  per week. He reports current drug use. Drug: Marijuana.  No Known Allergies  Family History  Problem Relation Age of Onset   Diabetes Mother    Hyperlipidemia Mother    Diabetes Maternal Grandmother    Heart disease Paternal Grandfather    Cancer Neg Hx     Prior to Admission medications   Medication Sig Start Date End Date Taking? Authorizing Provider  aMILoride  (MIDAMOR ) 5 MG tablet Take 2 tablets (10 mg total) by mouth daily. Patient taking differently: Take 5 mg by mouth daily. 12/23/22  Yes Roslyn Coombe, MD  amLODipine  (NORVASC ) 5 MG tablet Take 1 tablet (5 mg total) by mouth daily. 12/23/22 12/23/23 Yes Roslyn Coombe, MD  carvedilol  (COREG ) 6.25 MG tablet TAKE 1 TABLET BY MOUTH 2 (TWO) TIMES DAILY. 12/23/22  Yes Roslyn Coombe, MD  famotidine  (PEPCID ) 20 MG tablet Take 1 tablet (20 mg total) by mouth 2 (two) times daily. 11/28/22  Yes Roslyn Coombe, MD  loperamide  (IMODIUM ) 2 MG capsule Take 1 capsule (2 mg total) by mouth as needed for diarrhea or loose stools. 03/30/22  Yes Audria Leather, MD  Multiple Vitamin (MULTIVITAMIN WITH MINERALS) TABS tablet Take 1  tablet by mouth daily. 03/30/22  Yes Audria Leather, MD    Physical Exam: Vitals:   10/21/23 1337 10/21/23 1338 10/21/23 1357 10/21/23 1445  BP: 111/73  111/73 122/85  Pulse:   (!) 147 (!) 139  Resp: (!) 25 (!) 30 (!) 30 17  Temp: 98.2 F (36.8 C)  98.2 F (36.8 C)   TempSrc: Oral     SpO2:   99% 100%   General: Alert, oriented x3, resting comfortably in no acute distress HEENT: EOMI, oropharynx clear, moist mucous membranes, hearing intact Neck: Trachea midline and no gross thyromegaly Respiratory: Lungs clear to auscultation bilaterally with normal respiratory effort; no w/r/r Cardiovascular:  Regular rate and rhythm w/o m/r/g Abdomen: Soft, nontender, nondistended. Positive bowel sounds MSK: No obvious joint deformities or swelling Skin: No obvious rashes or lesions Neurologic: Awake, alert, spontaneously moves all extremities, strength intact Psychiatric: Appropriate mood and affect, conversational and cooperative  Data Reviewed:  Lab Results  Component Value Date   WBC 9.7 10/21/2023   HGB 12.9 (L) 10/21/2023   HCT 34.4 (L) 10/21/2023   MCV 71.5 (L) 10/21/2023   PLT 65 (L) 10/21/2023   Lab Results  Component Value Date   GLUCOSE 103 (H) 10/21/2023   CALCIUM  6.8 (L) 10/21/2023   NA 125 (L) 10/21/2023   K 3.1 (L) 10/21/2023   CO2 24 10/21/2023   CL 79 (L) 10/21/2023   BUN 5 (L) 10/21/2023   CREATININE 0.89 10/21/2023   Lab Results  Component Value Date   ALT 212 (H) 10/21/2023   AST 486 (H) 10/21/2023   ALKPHOS 184 (H) 10/21/2023   BILITOT 5.8 (H) 10/21/2023   Lab Results  Component Value Date   INR 1.2 11/11/2022   INR 1.1 08/07/2020    Radiology: CT ABDOMEN PELVIS W CONTRAST Result Date: 10/21/2023 CLINICAL DATA:  n/v/elevated LFTs EXAM: CT ABDOMEN AND PELVIS WITH CONTRAST TECHNIQUE: Multidetector CT imaging of the abdomen and pelvis was performed using the standard protocol following bolus administration of intravenous contrast. RADIATION DOSE REDUCTION: This exam was performed according to the departmental dose-optimization program which includes automated exposure control, adjustment of the mA and/or kV according to patient size and/or use of iterative reconstruction technique. CONTRAST:  75mL OMNIPAQUE  IOHEXOL  350 MG/ML SOLN COMPARISON:  November 11, 2022 FINDINGS: Lower chest: No acute abnormality. Hepatobiliary: Profound hepatic steatosis. Cholelithiasis versus gallbladder wall calcifications of the fundus. No pericholecystic fluid. No intrahepatic or extrahepatic biliary ductal dilation. Pancreas: Unremarkable. No pancreatic ductal dilatation or surrounding  inflammatory changes. However inferior to the pancreas and distinct from the pancreas ,there is ill-defined soft tissue density along the SMA and SMV. This measures approximately 29 x 15 by 22 mm (series 7, image 71; series 3, image 42 this ( Spleen: Unremarkable Adrenals/Urinary Tract: Adrenal glands are unremarkable. Kidneys enhance symmetrically. No hydronephrosis or obstructing nephrolithiasis. Bladder is unremarkable. Stomach/Bowel: No evidence of bowel obstruction. Colon is diffusely decompressed. There is circumferential wall prominence of the rectum. Appendix is normal. Submucosal fat throughout the majority of the colon. Stomach is decompressed Vascular/Lymphatic: Age advanced atherosclerotic calcifications of the nonaneurysmal abdominal aorta. Reproductive: Prostate is present. Other: No free air or free fluid. Musculoskeletal: No acute or significant osseous findings. IMPRESSION: 1. Profound hepatic steatosis. Cholelithiasis versus gallbladder wall calcification without evidence of acute cholecystitis. 2. There is ill-defined soft tissue density along the SMA and SMV inferior to the pancreas. This could reflect sequela of prior pancreatitis, shotty lymphadenopathy or other nonspecific process such as fibromatosis. Recommend correlation  with laboratory values. Recommend repeat CT abdomen pelvis with contrast within 3-6 months to assess for stability. Alternatively, further characterization could also be performed with dedicated outpatient abdominal MRI. 3. There is circumferential wall prominence of the rectum. This could reflect a nonspecific infectious or inflammatory colitis. Recommend correlation with symptoms. 4. Age advanced atherosclerotic calcifications of the nonaneurysmal abdominal aorta. Aortic Atherosclerosis (ICD10-I70.0). Electronically Signed   By: Clancy Crimes M.D.   On: 10/21/2023 12:50   DG Chest Portable 1 View Result Date: 10/21/2023 CLINICAL DATA:  Tachycardia.  Shakiness and  chills. EXAM: PORTABLE CHEST 1 VIEW COMPARISON:  11/11/2022. FINDINGS: The heart size and mediastinal contours are within normal limits. Chronic eventration of the right hemidiaphragm. Both lungs are clear. No pleural effusion or pneumothorax. The visualized skeletal structures are unremarkable. IMPRESSION: No acute cardiopulmonary findings. Electronically Signed   By: Mannie Seek M.D.   On: 10/21/2023 12:21    Assessment and Plan: 2M h/o HTN, HIV, GERD, and GAD p/w FTT c/b n/v/d x1 week iso EtOH withdrawal.  FTT Intractable n/v/d EtOH withdrawal -Continue CIWA protocol w/ IV ativan  prn -Continue PO thiamine  and folate  Hyponatremia Hypokalemia Hypomagnesesmia Possible refeeding syndrome -Replete lytes prn  H/o HIV -PTA Biktarvy  -F/u CD4 and VL -OP ID f/u for ongoing HIV tx   Advance Care Planning:   Code Status: Full Code   Consults: N/A  Family Communication: N/A  Severity of Illness: The appropriate patient status for this patient is INPATIENT. Inpatient status is judged to be reasonable and necessary in order to provide the required intensity of service to ensure the patient's safety. The patient's presenting symptoms, physical exam findings, and initial radiographic and laboratory data in the context of their chronic comorbidities is felt to place them at high risk for further clinical deterioration. Furthermore, it is not anticipated that the patient will be medically stable for discharge from the hospital within 2 midnights of admission.   * I certify that at the point of admission it is my clinical judgment that the patient will require inpatient hospital care spanning beyond 2 midnights from the point of admission due to high intensity of service, high risk for further deterioration and high frequency of surveillance required.*   ------- I spent 55 minutes reviewing previous labs/notes, obtaining separate history at the bedside, counseling/discussing the treatment  plan outlined above, ordering medications/tests, and performing clinical documentation.  Author: Arne Langdon, MD 10/21/2023 2:49 PM  For on call review [email or web address].

## 2023-10-21 NOTE — Discharge Instructions (Signed)

## 2023-10-21 NOTE — ED Provider Notes (Addendum)
 [facility] AT [facility] Provider Note   CSN: [id number] Arrival date & time: 10/21/23  4098     History  Chief Complaint  Patient presents with   Emesis    [facility] Flores is a 33 y.o. male.  With a history of HIV, hypertension who presents to the ED for vomiting.  1 week of vomiting, diarrhea chills and shaking.  Has not taken his daily medications including carvedilol .  Drinks alcohol  frequently.  No chest pain or shortness of breath.  History of noncompliance with antiretroviral therapy   Emesis      Home Medications Prior to Admission medications   Medication Sig Start Date End Date Taking? Authorizing Provider  aMILoride  (MIDAMOR ) 5 MG tablet Take 2 tablets (10 mg total) by mouth daily. Patient taking differently: Take 5 mg by mouth daily. 12/23/22  Yes Roslyn Coombe, MD  amLODipine  (NORVASC ) 5 MG tablet Take 1 tablet (5 mg total) by mouth daily. 12/23/22 12/23/23 Yes Roslyn Coombe, MD  carvedilol  (COREG ) 6.25 MG tablet TAKE 1 TABLET BY MOUTH 2 (TWO) TIMES DAILY. 12/23/22  Yes Roslyn Coombe, MD  famotidine  (PEPCID ) 20 MG tablet Take 1 tablet (20 mg total) by mouth 2 (two) times daily. 11/28/22  Yes Roslyn Coombe, MD  loperamide  (IMODIUM ) 2 MG capsule Take 1 capsule (2 mg total) by mouth as needed for diarrhea or loose stools. 03/30/22  Yes Audria Leather, MD  Multiple Vitamin (MULTIVITAMIN WITH MINERALS) TABS tablet Take 1 tablet by mouth daily. 03/30/22  Yes Audria Leather, MD      Allergies    Patient has no known allergies.    Review of Systems   Review of Systems  Gastrointestinal:  Positive for vomiting.    Physical Exam Updated Vital Signs BP 112/79   Pulse 88   Temp 98.2 F (36.8 C)   Resp (!) 25   SpO2 100%  Physical Exam Vitals and nursing note reviewed.  HENT:     Head: Normocephalic and atraumatic.  Eyes:     Pupils: Pupils are equal, round, and reactive to light.  Cardiovascular:     Rate and Rhythm: Normal rate  and regular rhythm.  Pulmonary:     Effort: Pulmonary effort is normal.     Breath sounds: Normal breath sounds.  Abdominal:     Palpations: Abdomen is soft.     Tenderness: There is no abdominal tenderness.  Skin:    General: Skin is warm and dry.  Neurological:     Mental Status: He is alert.  Psychiatric:        Mood and Affect: Mood normal.     ED Results / Procedures / Treatments   Labs (all labs ordered are listed, but only abnormal results are displayed) Labs Reviewed  COMPREHENSIVE METABOLIC PANEL WITH GFR - Abnormal; Notable for the following components:      Result Value   Sodium 125 (*)    Potassium 3.1 (*)    Chloride 79 (*)    Glucose, Bld 103 (*)    BUN 5 (*)    Calcium  6.8 (*)    Albumin 2.3 (*)    AST 486 (*)    ALT 212 (*)    Alkaline Phosphatase 184 (*)    Total Bilirubin 5.8 (*)    Anion gap 22 (*)    All other components within normal limits  CBC WITH DIFFERENTIAL/PLATELET - Abnormal; Notable for the following components:   Hemoglobin 12.9 (*)  HCT 34.4 (*)    MCV 71.5 (*)    MCHC 37.5 (*)    Platelets 65 (*)    All other components within normal limits  MAGNESIUM  - Abnormal; Notable for the following components:   Magnesium  0.7 (*)    All other components within normal limits  CBG MONITORING, ED - Abnormal; Notable for the following components:   Glucose-Capillary 109 (*)    All other components within normal limits  CULTURE, BLOOD (ROUTINE X 2)  CULTURE, BLOOD (ROUTINE X 2)  CK  T-HELPER CELLS (CD4) COUNT (NOT AT [facility])  PHOSPHORUS  HIV-1 RNA QUANT-NO REFLEX-BLD  CBC  CREATININE, SERUM    EKG None  Radiology CT ABDOMEN PELVIS W CONTRAST Result Date: 10/21/2023 CLINICAL DATA:  n/v/elevated LFTs EXAM: CT ABDOMEN AND PELVIS WITH CONTRAST TECHNIQUE: Multidetector CT imaging of the abdomen and pelvis was performed using the standard protocol following bolus administration of intravenous contrast. RADIATION DOSE REDUCTION: This exam was  performed according to the departmental dose-optimization program which includes automated exposure control, adjustment of the mA and/or kV according to patient size and/or use of iterative reconstruction technique. CONTRAST:  75mL OMNIPAQUE  IOHEXOL  350 MG/ML SOLN COMPARISON:  November 11, 2022 FINDINGS: Lower chest: No acute abnormality. Hepatobiliary: Profound hepatic steatosis. Cholelithiasis versus gallbladder wall calcifications of the fundus. No pericholecystic fluid. No intrahepatic or extrahepatic biliary ductal dilation. Pancreas: Unremarkable. No pancreatic ductal dilatation or surrounding inflammatory changes. However inferior to the pancreas and distinct from the pancreas ,there is ill-defined soft tissue density along the SMA and SMV. This measures approximately 29 x 15 by 22 mm (series 7, image 71; series 3, image 42 this ( Spleen: Unremarkable Adrenals/Urinary Tract: Adrenal glands are unremarkable. Kidneys enhance symmetrically. No hydronephrosis or obstructing nephrolithiasis. Bladder is unremarkable. Stomach/Bowel: No evidence of bowel obstruction. Colon is diffusely decompressed. There is circumferential wall prominence of the rectum. Appendix is normal. Submucosal fat throughout the majority of the colon. Stomach is decompressed Vascular/Lymphatic: Age advanced atherosclerotic calcifications of the nonaneurysmal abdominal aorta. Reproductive: Prostate is present. Other: No free air or free fluid. Musculoskeletal: No acute or significant osseous findings. IMPRESSION: 1. Profound hepatic steatosis. Cholelithiasis versus gallbladder wall calcification without evidence of acute cholecystitis. 2. There is ill-defined soft tissue density along the SMA and SMV inferior to the pancreas. This could reflect sequela of prior pancreatitis, shotty lymphadenopathy or other nonspecific process such as fibromatosis. Recommend correlation with laboratory values. Recommend repeat CT abdomen pelvis with contrast within  3-6 months to assess for stability. Alternatively, further characterization could also be performed with dedicated outpatient abdominal MRI. 3. There is circumferential wall prominence of the rectum. This could reflect a nonspecific infectious or inflammatory colitis. Recommend correlation with symptoms. 4. Age advanced atherosclerotic calcifications of the nonaneurysmal abdominal aorta. Aortic Atherosclerosis (ICD10-I70.0). Electronically Signed   By: Clancy Crimes M.D.   On: 10/21/2023 12:50   DG Chest Portable 1 View Result Date: 10/21/2023 CLINICAL DATA:  Tachycardia.  Shakiness and chills. EXAM: PORTABLE CHEST 1 VIEW COMPARISON:  11/11/2022. FINDINGS: The heart size and mediastinal contours are within normal limits. Chronic eventration of the right hemidiaphragm. Both lungs are clear. No pleural effusion or pneumothorax. The visualized skeletal structures are unremarkable. IMPRESSION: No acute cardiopulmonary findings. Electronically Signed   By: Mannie Seek M.D.   On: 10/21/2023 12:21    Procedures Procedures    Medications Ordered in ED Medications  carvedilol  (COREG ) tablet 6.25 mg (6.25 mg Oral Given 10/21/23 1622)  metroNIDAZOLE  (FLAGYL ) IVPB 500  mg (0 mg Intravenous Stopped 10/21/23 1614)  LORazepam  (ATIVAN ) tablet 1-4 mg ( Oral See Alternative 10/21/23 1447)    Or  LORazepam  (ATIVAN ) injection 1-4 mg (2 mg Intravenous Given 10/21/23 1447)  thiamine  (VITAMIN B1) tablet 100 mg (100 mg Oral Given 10/21/23 1444)    Or  thiamine  (VITAMIN B1) injection 100 mg ( Intravenous See Alternative 10/21/23 1444)  folic acid  (FOLVITE ) tablet 1 mg (1 mg Oral Given 10/21/23 1619)  multivitamin with minerals tablet 1 tablet (1 tablet Oral Given 10/21/23 1444)  enoxaparin  (LOVENOX ) injection 40 mg (40 mg Subcutaneous Given 10/21/23 1619)  amLODipine  (NORVASC ) tablet 5 mg (has no administration in time range)  famotidine  (PEPCID ) tablet 20 mg (has no administration in time range)   bictegravir-emtricitabine -tenofovir  AF (BIKTARVY ) 50-200-25 MG per tablet 1 tablet (1 tablet Oral Given 10/21/23 1619)  sodium chloride  0.9 % bolus 1,000 mL (0 mLs Intravenous Stopped 10/21/23 1339)  ondansetron  (ZOFRAN ) injection 4 mg (4 mg Intravenous Given 10/21/23 1051)  potassium chloride  (KLOR-CON ) packet 40 mEq (40 mEq Oral Given 10/21/23 1212)  magnesium  sulfate IVPB 2 g 50 mL (0 g Intravenous Stopped 10/21/23 1338)  LORazepam  (ATIVAN ) tablet 1 mg (1 mg Oral Given 10/21/23 1212)  iohexol  (OMNIPAQUE ) 350 MG/ML injection 75 mL (75 mLs Intravenous Contrast Given 10/21/23 1231)  cefTRIAXone (ROCEPHIN) 2 g in sodium chloride  0.9 % 100 mL IVPB (0 g Intravenous Stopped 10/21/23 1501)  magnesium  sulfate IVPB 2 g 50 mL (2 g Intravenous New Bag/Given 10/21/23 1614)    ED Course/ Medical Decision Making/ A&P                                 Medical Decision Making 33 year old male with history as above presenting to the ED for nausea vomiting diarrhea.  Shaking chills at home.  No respiratory symptoms.  Benign physical exam.  Laboratory workup notable for multiple electrolyte derangements including hypokalemia hyponatremia hypomagnesemia.  Will provide repletion here.  CT abdomen pelvis shows findings concerning for potential colitis.  Will cover with ceftriaxone and Flagyl  and admit to medicine  Amount and/or Complexity of Data Reviewed Labs: ordered. Radiology: ordered.  Risk Prescription drug management. Decision regarding hospitalization.           Final Clinical Impression(s) / ED Diagnoses Final diagnoses:  HIV infection, unspecified symptom status (HCC)  Colitis  Nausea vomiting and diarrhea  Hypomagnesemia  Hypokalemia    Rx / DC Orders ED Discharge Orders     None         Sallyanne Creamer, DO 10/21/23 1714    Sallyanne Creamer, DO 10/21/23 1715

## 2023-10-21 NOTE — ED Notes (Signed)
 GOT PATIENT ON THE MONITOR DID EKG SHOWN TO ER PROVIDER PATIENT IS RESTING WITH CALL BELL IN REACH

## 2023-10-21 NOTE — ED Notes (Signed)
 Attmped to call the floor with no answer

## 2023-10-22 DIAGNOSIS — D696 Thrombocytopenia, unspecified: Secondary | ICD-10-CM | POA: Diagnosis not present

## 2023-10-22 DIAGNOSIS — B370 Candidal stomatitis: Secondary | ICD-10-CM | POA: Diagnosis not present

## 2023-10-22 DIAGNOSIS — F10930 Alcohol use, unspecified with withdrawal, uncomplicated: Secondary | ICD-10-CM | POA: Diagnosis not present

## 2023-10-22 DIAGNOSIS — B20 Human immunodeficiency virus [HIV] disease: Secondary | ICD-10-CM | POA: Diagnosis not present

## 2023-10-22 DIAGNOSIS — F10139 Alcohol abuse with withdrawal, unspecified: Secondary | ICD-10-CM

## 2023-10-22 LAB — T-HELPER CELLS (CD4) COUNT (NOT AT ARMC)
CD4 % Helper T Cell: 14 % — ABNORMAL LOW (ref 33–65)
CD4 T Cell Abs: 170 /uL — ABNORMAL LOW (ref 400–1790)

## 2023-10-22 LAB — CBC
HCT: 30.3 % — ABNORMAL LOW (ref 39.0–52.0)
Hemoglobin: 11.1 g/dL — ABNORMAL LOW (ref 13.0–17.0)
MCH: 26.6 pg (ref 26.0–34.0)
MCHC: 36.6 g/dL — ABNORMAL HIGH (ref 30.0–36.0)
MCV: 72.7 fL — ABNORMAL LOW (ref 80.0–100.0)
Platelets: 55 10*3/uL — ABNORMAL LOW (ref 150–400)
RBC: 4.17 MIL/uL — ABNORMAL LOW (ref 4.22–5.81)
RDW: 14.1 % (ref 11.5–15.5)
WBC: 7.3 10*3/uL (ref 4.0–10.5)
nRBC: 0 % (ref 0.0–0.2)

## 2023-10-22 LAB — BLOOD CULTURE ID PANEL (REFLEXED) - BCID2

## 2023-10-22 LAB — HEPATIC FUNCTION PANEL
ALT: 169 U/L — ABNORMAL HIGH (ref 0–44)
AST: 380 U/L — ABNORMAL HIGH (ref 15–41)
Albumin: 1.9 g/dL — ABNORMAL LOW (ref 3.5–5.0)
Alkaline Phosphatase: 178 U/L — ABNORMAL HIGH (ref 38–126)
Bilirubin, Direct: 3.3 mg/dL — ABNORMAL HIGH (ref 0.0–0.2)
Indirect Bilirubin: 2.2 mg/dL — ABNORMAL HIGH (ref 0.3–0.9)
Total Bilirubin: 5.5 mg/dL — ABNORMAL HIGH (ref 0.0–1.2)
Total Protein: 5.7 g/dL — ABNORMAL LOW (ref 6.5–8.1)

## 2023-10-22 LAB — COMPREHENSIVE METABOLIC PANEL WITH GFR
ALT: 180 U/L — ABNORMAL HIGH (ref 0–44)
AST: 437 U/L — ABNORMAL HIGH (ref 15–41)
Albumin: 2 g/dL — ABNORMAL LOW (ref 3.5–5.0)
Alkaline Phosphatase: 181 U/L — ABNORMAL HIGH (ref 38–126)
Anion gap: 20 — ABNORMAL HIGH (ref 5–15)
BUN: 6 mg/dL (ref 6–20)
CO2: 23 mmol/L (ref 22–32)
Calcium: 6.3 mg/dL — CL (ref 8.9–10.3)
Chloride: 85 mmol/L — ABNORMAL LOW (ref 98–111)
Creatinine, Ser: 1.16 mg/dL (ref 0.61–1.24)
GFR, Estimated: >60 mL/min (ref >60)
Glucose, Bld: 107 mg/dL — ABNORMAL HIGH (ref 70–99)
Potassium: 2.8 mmol/L — ABNORMAL LOW (ref 3.5–5.1)
Sodium: 128 mmol/L — ABNORMAL LOW (ref 135–145)
Total Bilirubin: 5.8 mg/dL — ABNORMAL HIGH (ref 0.0–1.2)
Total Protein: 6 g/dL — ABNORMAL LOW (ref 6.5–8.1)

## 2023-10-22 LAB — LIPASE, BLOOD
Lipase: 21 U/L (ref 11–51)
Lipase: 23 U/L (ref 11–51)

## 2023-10-22 LAB — MAGNESIUM
Magnesium: 1.4 mg/dL — ABNORMAL LOW (ref 1.7–2.4)
Magnesium: 2.7 mg/dL — ABNORMAL HIGH (ref 1.7–2.4)

## 2023-10-22 LAB — PROTIME-INR
INR: 1.3 — ABNORMAL HIGH (ref 0.8–1.2)
Prothrombin Time: 16 s — ABNORMAL HIGH (ref 11.4–15.2)

## 2023-10-22 LAB — HIV-1 RNA QUANT-NO REFLEX-BLD
HIV 1 RNA Quant: 61100 {copies}/mL
LOG10 HIV-1 RNA: 4.786 {Log_copies}/mL

## 2023-10-22 LAB — PHOSPHORUS: Phosphorus: 5.1 mg/dL — ABNORMAL HIGH (ref 2.5–4.6)

## 2023-10-22 LAB — AMMONIA: Ammonia: 45 umol/L — ABNORMAL HIGH (ref 9–35)

## 2023-10-22 MED ORDER — POTASSIUM CHLORIDE CRYS ER 20 MEQ PO TBCR
40.0000 meq | EXTENDED_RELEASE_TABLET | ORAL | Status: AC
  Administered 2023-10-22: 40 meq via ORAL
  Filled 2023-10-22: qty 2

## 2023-10-22 MED ORDER — MAGNESIUM SULFATE 2 GM/50ML IV SOLN
2.0000 g | Freq: Once | INTRAVENOUS | Status: AC
  Administered 2023-10-22: 2 g via INTRAVENOUS
  Filled 2023-10-22: qty 50

## 2023-10-22 MED ORDER — POTASSIUM CHLORIDE 10 MEQ/100ML IV SOLN: 10.0000 meq | INTRAVENOUS | Status: DC

## 2023-10-22 MED ORDER — CALCIUM GLUCONATE-NACL 1-0.675 GM/50ML-% IV SOLN
1.0000 g | Freq: Once | INTRAVENOUS | Status: AC
  Administered 2023-10-22: 1000 mg via INTRAVENOUS
  Filled 2023-10-22: qty 50

## 2023-10-22 MED ORDER — POTASSIUM CHLORIDE IN NACL 40-0.9 MEQ/L-% IV SOLN
INTRAVENOUS | Status: AC
  Filled 2023-10-22 (×4): qty 1000

## 2023-10-22 MED ORDER — CALCIUM GLUCONATE-NACL 2-0.675 GM/100ML-% IV SOLN
2.0000 g | Freq: Once | INTRAVENOUS | Status: AC
  Administered 2023-10-22: 2000 mg via INTRAVENOUS
  Filled 2023-10-22: qty 100

## 2023-10-22 MED ORDER — POTASSIUM CHLORIDE 10 MEQ/100ML IV SOLN
10.0000 meq | INTRAVENOUS | Status: AC
  Administered 2023-10-22 (×2): 10 meq via INTRAVENOUS
  Filled 2023-10-22 (×2): qty 100

## 2023-10-22 MED ORDER — SODIUM CHLORIDE 0.9 % IV SOLN
2.0000 g | INTRAVENOUS | Status: DC
  Administered 2023-10-22: 2 g via INTRAVENOUS
  Filled 2023-10-22: qty 20

## 2023-10-22 MED ORDER — POTASSIUM CHLORIDE 10 MEQ/100ML IV SOLN
10.0000 meq | INTRAVENOUS | Status: AC
  Administered 2023-10-22: 10 meq via INTRAVENOUS
  Filled 2023-10-22: qty 100

## 2023-10-22 MED ORDER — CHLORDIAZEPOXIDE HCL 5 MG PO CAPS
10.0000 mg | ORAL_CAPSULE | Freq: Four times a day (QID) | ORAL | Status: DC
  Administered 2023-10-22 – 2023-10-26 (×18): 10 mg via ORAL
  Filled 2023-10-22 (×18): qty 2

## 2023-10-22 MED ORDER — ADULT MULTIVITAMIN W/MINERALS CH
1.0000 | ORAL_TABLET | Freq: Every day | ORAL | Status: DC
  Administered 2023-10-23 – 2023-10-26 (×4): 1 via ORAL
  Filled 2023-10-22 (×4): qty 1

## 2023-10-22 NOTE — Progress Notes (Signed)
 PROGRESS NOTE    PHARES DIEMERT  ONG:[id number] DOB: 1990/11/09 DOA: 10/21/2023 PCP: Roslyn Coombe, MD    Chief Complaint  Patient presents with   Emesis    Brief Narrative:   Patient is a 33 y.o. male with history of HIV, HTN, alcohol  use-presented with fatigue/weakness nausea, vomiting, secondary to alcohol  abuse and withdrawals.  Assessment & Plan:   Principal Problem:   Alcohol  withdrawal (HCC)  Actable nausea, vomiting and diarrhea Failure to thrive  -Likely due to alcohol  abuse, and early withdrawals -Remains significant this morning, will keep on clear liquid diet -Will start on IV Protonix  40 mg twice daily. 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, ID consulted, resumed on Biktarvy   2/2 blood cultures growing gram-positive cocci 1/2 gram-positive rods -Unclear significance, will await for further ID recommendation  Proctitis - Evidence on imaging, will continue with IV Rocephin and Flagyl   Bilateral upper extremity erythema - Patient reports status post peeling at the salon recently, does not appear to be infected, will continue to monitor  Abnormal finding in pancreas on imaging - Further workup can be pursued as an outpatient    DVT prophylaxis: SCD Code Status: Full Family Communication: none at [place] Disposition:   Status is: Inpatient    Consultants:   ID   Subjective: Reports nausea, vomiting, no abdominal pain  Objective: Vitals:   10/22/23 0217 10/22/23 0545 10/22/23 0745 10/22/23 0749  BP: 101/64 102/84 109/79   Pulse: (!) 135  87 (!) 119  Resp: 20 20    Temp:  98.6 F (37 C) 98.3 F (36.8 C)   TempSrc:  Oral Oral   SpO2: 95% 100%    Weight:      Height:        Intake/Output Summary (Last 24 hours) at 10/22/2023 1403 Last data filed at 10/21/2023 2035 Gross per 24 hour  Intake 488.92 ml  Output --  Net 488.92 ml   Filed Weights   10/21/23 1942  Weight: 87.4 kg    Examination:  Awake Alert, Oriented X 3, No new F.N deficits, Normal affect Symmetrical Chest wall movement, Good air movement bilaterally, CTAB RRR,No Gallops,Rubs or new Murmurs, No Parasternal Heave +ve B.Sounds, Abd Soft, No tenderness, No rebound - guarding or rigidity. No Cyanosis, Clubbing or edema, bilateral upper extremity erythema      Data Reviewed: I have personally reviewed following labs and imaging studies  CBC: Recent Labs  Lab 10/21/23 1010 10/21/23 1926 10/22/23 0427  WBC 9.7 10.6* 7.3  NEUTROABS 6.3  --   --   HGB 12.9* 12.3* 11.1*  HCT 34.4* 33.4* 30.3*  MCV 71.5* 71.5* 72.7*  PLT 65* 62* 55*    Basic Metabolic Panel: Recent Labs  Lab 10/21/23 1010 10/21/23 1433 10/22/23 0427  NA 125*  --  128*  K 3.1*  --  2.8*  CL 79*  --  85*  CO2 24  --  23  GLUCOSE 103*  --  107*  BUN 5*  --  6  CREATININE 0.89 0.78 1.16  CALCIUM  6.8*  --  6.3*  MG 0.7*  --  1.4*  PHOS  --  6.0*  --     GFR: Estimated Creatinine Clearance: 94.4 mL/min (by C-G formula based on SCr of 1.16 mg/dL).  Liver Function Tests: Recent Labs  Lab 10/21/23 1010 10/22/23 0427  AST 486* 437*  ALT 212* 180*  ALKPHOS 184* 181*  BILITOT 5.8* 5.8*  PROT 6.7 6.0*  ALBUMIN 2.3* 2.0*    CBG: Recent Labs  Lab 10/21/23 1033  GLUCAP 109*     Recent Results (from the past 240 hours)  Culture, blood (routine x 2)     Status: None  (Preliminary result)   Collection Time: 10/21/23  1:30 PM   Specimen: BLOOD RIGHT ARM  Result Value Ref Range Status   Specimen Description BLOOD RIGHT ARM  Final   Special Requests   Final    BOTTLES DRAWN AEROBIC AND ANAEROBIC Blood Culture results may not be optimal due to an inadequate volume of blood received in culture bottles   Culture  Setup Time   Final    GRAM POSITIVE COCCI IN BOTH AEROBIC AND ANAEROBIC BOTTLES Organism ID to follow CRITICAL RESULT CALLED TO, READ BACK BY AND VERIFIED WITH: BAlease Amend PHARMD, AT 0827 10/22/23 D.VANHOOK GRAM POSITIVE RODS AEROBIC BOTTLE ONLY CRITICAL RESULT CALLED TO, READ BACK BY AND VERIFIED WITH: PHARMD BOBBY JO STONER ON 10/22/23 @ 1336 BY DRT Performed at [facility], [address], [place], [place] [address]    Culture GRAM POSITIVE COCCI  Final   Report Status PENDING  Incomplete  Blood Culture ID Panel (Reflexed)     Status: Abnormal   Collection Time: 10/21/23  1:30 PM  Result Value Ref Range Status   Enterococcus faecalis NOT DETECTED NOT DETECTED Final   Enterococcus Faecium NOT DETECTED NOT DETECTED Final   Listeria monocytogenes NOT DETECTED NOT DETECTED Final   Staphylococcus species DETECTED (A) NOT DETECTED Final    Comment: CRITICAL RESULT CALLED TO, READ BACK BY AND VERIFIED WITH: BAlease Amend PHARMD, AT 0827 10/22/23 D.VANHOOK    Staphylococcus aureus (BCID) NOT DETECTED NOT DETECTED Final   Staphylococcus epidermidis DETECTED (A) NOT DETECTED Final    Comment: CRITICAL RESULT CALLED TO, READ BACK BY AND VERIFIED WITH: BAlease Amend PHARMD, AT 0827 10/22/23 D.VANHOOK    Staphylococcus lugdunensis NOT DETECTED NOT DETECTED Final   Streptococcus species NOT DETECTED NOT DETECTED Final   Streptococcus agalactiae NOT DETECTED NOT DETECTED Final   Streptococcus pneumoniae NOT DETECTED NOT DETECTED Final   Streptococcus pyogenes NOT DETECTED NOT DETECTED Final   A.calcoaceticus-baumannii NOT DETECTED NOT DETECTED Final    Bacteroides fragilis NOT DETECTED NOT DETECTED Final   Enterobacterales NOT DETECTED NOT DETECTED Final   Enterobacter cloacae complex NOT DETECTED NOT DETECTED Final   Escherichia coli NOT DETECTED NOT DETECTED Final   Klebsiella aerogenes NOT DETECTED NOT DETECTED Final   Klebsiella oxytoca NOT DETECTED NOT DETECTED Final   Klebsiella pneumoniae NOT DETECTED NOT DETECTED Final   Proteus species NOT DETECTED NOT DETECTED Final   Salmonella species NOT DETECTED NOT DETECTED Final   Serratia marcescens NOT DETECTED NOT DETECTED Final   Haemophilus influenzae NOT DETECTED NOT DETECTED Final   Neisseria meningitidis NOT DETECTED NOT DETECTED Final   Pseudomonas aeruginosa NOT DETECTED NOT DETECTED Final   Stenotrophomonas maltophilia NOT DETECTED NOT DETECTED Final   Candida albicans NOT DETECTED NOT DETECTED Final   Candida auris NOT DETECTED NOT  DETECTED Final   Candida glabrata NOT DETECTED NOT DETECTED Final   Candida krusei NOT DETECTED NOT DETECTED Final   Candida parapsilosis NOT DETECTED NOT DETECTED Final   Candida tropicalis NOT DETECTED NOT DETECTED Final   Cryptococcus neoformans/gattii NOT DETECTED NOT DETECTED Final   Methicillin resistance mecA/C NOT DETECTED NOT DETECTED Final    Comment: Performed at [facility], [address], [place], [place] [address]  Culture, blood (routine x 2)     Status: None (Preliminary result)   Collection Time: 10/21/23  2:21 PM   Specimen: BLOOD LEFT ARM  Result Value Ref Range Status   Specimen Description BLOOD LEFT ARM  Final   Special Requests   Final    BOTTLES DRAWN AEROBIC AND ANAEROBIC Blood Culture adequate volume   Culture   Final    NO GROWTH < 24 HOURS Performed at [facility], [address] [place], [place] [address]    Report Status PENDING  Incomplete         Radiology Studies: CT ABDOMEN PELVIS W CONTRAST Result Date: 10/21/2023 CLINICAL DATA:  n/v/elevated LFTs EXAM: CT ABDOMEN AND PELVIS WITH  CONTRAST TECHNIQUE: Multidetector CT imaging of the abdomen and pelvis was performed using the standard protocol following bolus administration of intravenous contrast. RADIATION DOSE REDUCTION: This exam was performed according to the departmental dose-optimization program which includes automated exposure control, adjustment of the mA and/or kV according to patient size and/or use of iterative reconstruction technique. CONTRAST:  75mL OMNIPAQUE  IOHEXOL  350 MG/ML SOLN COMPARISON:  November 11, 2022 FINDINGS: Lower chest: No acute abnormality. Hepatobiliary: Profound hepatic steatosis. Cholelithiasis versus gallbladder wall calcifications of the fundus. No pericholecystic fluid. No intrahepatic or extrahepatic biliary ductal dilation. Pancreas: Unremarkable. No pancreatic ductal dilatation or surrounding inflammatory changes. However inferior to the pancreas and distinct from the pancreas ,there is ill-defined soft tissue density along the SMA and SMV. This measures approximately 29 x 15 by 22 mm (series 7, image 71; series 3, image 42 this ( Spleen: Unremarkable Adrenals/Urinary Tract: Adrenal glands are unremarkable. Kidneys enhance symmetrically. No hydronephrosis or obstructing nephrolithiasis. Bladder is unremarkable. Stomach/Bowel: No evidence of bowel obstruction. Colon is diffusely decompressed. There is circumferential wall prominence of the rectum. Appendix is normal. Submucosal fat throughout the majority of the colon. Stomach is decompressed Vascular/Lymphatic: Age advanced atherosclerotic calcifications of the nonaneurysmal abdominal aorta. Reproductive: Prostate is present. Other: No free air or free fluid. Musculoskeletal: No acute or significant osseous findings. IMPRESSION: 1. Profound hepatic steatosis. Cholelithiasis versus gallbladder wall calcification without evidence of acute cholecystitis. 2. There is ill-defined soft tissue density along the SMA and SMV inferior to the pancreas. This could  reflect sequela of prior pancreatitis, shotty lymphadenopathy or other nonspecific process such as fibromatosis. Recommend correlation with laboratory values. Recommend repeat CT abdomen pelvis with contrast within 3-6 months to assess for stability. Alternatively, further characterization could also be performed with dedicated outpatient abdominal MRI. 3. There is circumferential wall prominence of the rectum. This could reflect a nonspecific infectious or inflammatory colitis. Recommend correlation with symptoms. 4. Age advanced atherosclerotic calcifications of the nonaneurysmal abdominal aorta. Aortic Atherosclerosis (ICD10-I70.0). Electronically Signed   By: Clancy Crimes M.D.   On: 10/21/2023 12:50   DG Chest Portable 1 View Result Date: 10/21/2023 CLINICAL DATA:  Tachycardia.  Shakiness and chills. EXAM: PORTABLE CHEST 1 VIEW COMPARISON:  11/11/2022. FINDINGS: The heart size and mediastinal contours are within normal limits. Chronic eventration of the right hemidiaphragm. Both lungs  are clear. No pleural effusion or pneumothorax. The visualized skeletal structures are unremarkable. IMPRESSION: No acute cardiopulmonary findings. Electronically Signed   By: Mannie Seek M.D.   On: 10/21/2023 12:21        Scheduled Meds:  amLODipine   5 mg Oral Daily   bictegravir-emtricitabine -tenofovir  AF  1 tablet Oral Daily   carvedilol   6.25 mg Oral BID WC   chlordiazePOXIDE  10 mg Oral QID   famotidine   20 mg Oral BID   folic acid   1 mg Oral Daily   multivitamin with minerals  1 tablet Oral Q lunch   potassium chloride   40 mEq Oral Q4H   thiamine   100 mg Oral Daily   Or   thiamine   100 mg Intravenous Daily   Continuous Infusions:  0.9 % NaCl with KCl 40 mEq / L 100 mL/hr at 10/22/23 1610   calcium  gluconate 2,000 mg (10/22/23 1401)   cefTRIAXone (ROCEPHIN)  IV     metronidazole  500 mg (10/22/23 0104)   potassium chloride        LOS: 1 day       Seena Dadds, MD [facility]   To contact the attending provider between 7A-7P or the covering provider during after hours 7P-7A, please log into the web site [email or web address] and access using universal  password for that web site. If you do not have the password, please call the hospital operator.  10/22/2023, 2:03 PM

## 2023-10-22 NOTE — Consult Note (Signed)
 [facility]  Total days of antibiotics 2         Reason for Consult: untreated hiv disease, thrush    Referring Physician: [email or web address]  Principal Problem:   Alcohol  withdrawal (HCC)    HPI: Harold Flores is a 33 y.o. male with history of hiv disease, Guillain-barre, and etoh abuse, and was admitted in June 2024 - rhabdomyolysis and alcohol  hepatitis. He has been off of his ART, biktarvy  for the past year. He has noticed having thrush lately. He is admitted on 5/13 for 1 week hx of nausea and vomiting and concerning for alcohol  withdrawal. He reports drinking etoh daily. No hemoptysis, but vomiting -- billious, nonbloody emesis. He was found to have mildly elevated wbc of 10, but platelets are now 65. He has transaminitis of AST of 486/ ALT 212/Tbili 5.8   Past Medical History:  Diagnosis Date   Anal fissure    Anal fistula    Anal ulcer    Asthma    Epistaxis    GAD (generalized anxiety disorder)    GERD (gastroesophageal reflux disease)    Hemorrhoid    HIV (human immunodeficiency virus infection) (HCC)    Hypertension    Syphilis    Thrush 03/25/2022   Varicella     Allergies: No Known Allergies  Current antibiotics:   MEDICATIONS:  amLODipine   5 mg Oral Daily   bictegravir-emtricitabine -tenofovir  AF  1 tablet Oral Daily   carvedilol   6.25 mg Oral BID WC   chlordiazePOXIDE  10 mg Oral QID   famotidine   20 mg Oral BID   folic acid   1 mg Oral Daily   multivitamin with minerals  1 tablet Oral Q lunch   thiamine   100 mg Oral Daily   Or   thiamine   100 mg Intravenous Daily    Social History   Tobacco Use   Smoking status: Every Day    Current packs/day: 0.50    Average packs/day: 0.5 packs/day for 5.0 years (2.5 ttl pk-yrs)    Types: E-cigarettes, Cigarettes   Smokeless tobacco: Never  Substance Use Topics   Alcohol  use: Yes    Alcohol /week: 7.0 standard drinks of alcohol     Types: 7 Cans of beer per week    Comment: daily  drinker, bootleggers (beer) x 6-10/ day   Drug use: Yes    Types: Marijuana    Comment: occ    Family History  Problem Relation Age of Onset   Diabetes Mother    Hyperlipidemia Mother    Diabetes Maternal Grandmother    Heart disease Paternal Grandfather    Cancer Neg Hx     Review of Systems -  12 point ros is negative except what is mentioned above  OBJECTIVE: Temp:  [98.3 F (36.8 C)-99.4 F (37.4 C)] 98.3 F (36.8 C) (05/14 0745) Pulse Rate:  [87-143] 119 (05/14 0749) Resp:  [19-26] 20 (05/14 0545) BP: (92-112)/(64-84) 109/79 (05/14 0745) SpO2:  [95 %-100 %] 100 % (05/14 0545) Weight:  [87.4 kg] 87.4 kg (05/13 1942) Physical Exam  Constitutional: He is oriented to person, place, and time. He appears well-developed and well-nourished. No distress.  HENT:  Mouth/Throat: Oropharynx is clear and moist. +Thrush Cardiovascular: Normal rate, regular rhythm and normal heart sounds. Exam reveals no gallop and no friction rub.  No murmur heard.  Pulmonary/Chest: Effort normal and breath sounds normal. No respiratory distress. He has no wheezes.  Abdominal: Soft. Bowel sounds are normal. He exhibits no  distension. There is no tenderness.  Lymphadenopathy:  He has no cervical adenopathy.  Neurological: He is alert and oriented to person, place, and time.  Skin: Skin is warm and dry. No rash noted. No erythema.  Psychiatric: He has a normal mood and affect. His behavior is normal.    LABS: Results for orders placed or performed during the hospital encounter of 10/21/23 (from the past 48 hours)  Comprehensive metabolic panel     Status: Abnormal   Collection Time: 10/21/23 10:10 AM  Result Value Ref Range   Sodium 125 (L) 135 - 145 mmol/L   Potassium 3.1 (L) 3.5 - 5.1 mmol/L   Chloride 79 (L) 98 - 111 mmol/L   CO2 24 22 - 32 mmol/L   Glucose, Bld 103 (H) 70 - 99 mg/dL    Comment: Glucose reference range applies only to samples taken after fasting for at least 8 hours.    BUN 5 (L) 6 - 20 mg/dL   Creatinine, Ser 4.69 0.61 - 1.24 mg/dL   Calcium  6.8 (L) 8.9 - 10.3 mg/dL   Total Protein 6.7 6.5 - 8.1 g/dL   Albumin 2.3 (L) 3.5 - 5.0 g/dL   AST 629 (H) 15 - 41 U/L   ALT 212 (H) 0 - 44 U/L   Alkaline Phosphatase 184 (H) 38 - 126 U/L   Total Bilirubin 5.8 (H) 0.0 - 1.2 mg/dL   GFR, Estimated >52 >84 mL/min    Comment: (NOTE) Calculated using the CKD-EPI Creatinine Equation (2021)    Anion gap 22 (H) 5 - 15    Comment: ELECTROLYTES REPEATED TO VERIFY Performed at [facility], [address] [place], [place] [address]   CBC with Differential     Status: Abnormal   Collection Time: 10/21/23 10:10 AM  Result Value Ref Range   WBC 9.7 4.0 - 10.5 K/uL   RBC 4.81 4.22 - 5.81 MIL/uL   Hemoglobin 12.9 (L) 13.0 - 17.0 g/dL   HCT 01.0 (L) 27.2 - 53.6 %   MCV 71.5 (L) 80.0 - 100.0 fL   MCH 26.8 26.0 - 34.0 pg   MCHC 37.5 (H) 30.0 - 36.0 g/dL   RDW 64.4 03.4 - 74.2 %   Platelets 65 (L) 150 - 400 K/uL    Comment: Immature Platelet Fraction may be clinically indicated, consider ordering this additional test [id number] REPEATED TO VERIFY PLATELET COUNT CONFIRMED BY SMEAR    nRBC 0.0 0.0 - 0.2 %   Neutrophils Relative % 65 %   Neutro Abs 6.3 1.7 - 7.7 K/uL   Lymphocytes Relative 26 %   Lymphs Abs 2.5 0.7 - 4.0 K/uL   Monocytes Relative 9 %   Monocytes Absolute 0.9 0.1 - 1.0 K/uL   Eosinophils Relative 0 %   Eosinophils Absolute 0.0 0.0 - 0.5 K/uL   Basophils Relative 0 %   Basophils Absolute 0.0 0.0 - 0.1 K/uL   RBC Morphology TARGET CELLS    Immature Granulocytes 0 %   Abs Immature Granulocytes 0.04 0.00 - 0.07 K/uL    Comment: Performed at [facility], [address], [place], [place] [address]  Magnesium      Status: Abnormal   Collection Time: 10/21/23 10:10 AM  Result Value Ref Range   Magnesium  0.7 (LL) 1.7 - 2.4 mg/dL    Comment: CRITICAL RESULT CALLED TO, READ BACK BY AND VERIFIED WITH Thurmon Florida, RN @ 1204 10/21/23 BY  SEKDAHL Performed at [facility], [address] [place], [place] [address]   CBG monitoring, ED     Status: Abnormal   Collection Time: 10/21/23 10:33 AM  Result Value Ref Range   Glucose-Capillary 109 (H) 70 - 99 mg/dL    Comment: Glucose reference range applies only to samples taken after fasting for at least 8 hours.  Culture, blood (routine x 2)     Status: None (Preliminary result)   Collection Time: 10/21/23  1:30 PM   Specimen: BLOOD RIGHT ARM  Result Value Ref Range   Specimen Description BLOOD RIGHT ARM    Special Requests      BOTTLES DRAWN AEROBIC AND ANAEROBIC Blood Culture results may not be optimal due to an inadequate volume of blood received in culture bottles   Culture  Setup Time      GRAM POSITIVE COCCI IN BOTH AEROBIC AND ANAEROBIC BOTTLES Organism ID to follow CRITICAL RESULT CALLED TO, READ BACK BY AND VERIFIED WITH: BAlease Amend PHARMD, AT 0827 10/22/23 D.VANHOOK GRAM POSITIVE RODS AEROBIC BOTTLE ONLY CRITICAL RESULT CALLED TO, READ BACK BY AND VERIFIED WITH: PHARMD BOBBY JO STONER ON 10/22/23 @ 1336 BY DRT Performed at [facility], [address], [place], [place] [address]    Culture GRAM POSITIVE COCCI    Report Status PENDING   Blood Culture ID Panel (Reflexed)     Status: Abnormal   Collection Time: 10/21/23  1:30 PM  Result Value Ref Range   Enterococcus faecalis NOT DETECTED NOT DETECTED   Enterococcus Faecium NOT DETECTED NOT DETECTED   Listeria monocytogenes NOT DETECTED NOT DETECTED   Staphylococcus species DETECTED (A) NOT DETECTED    Comment: CRITICAL RESULT CALLED TO, READ BACK BY AND VERIFIED WITH: BAlease Amend PHARMD, AT 0827 10/22/23 D.VANHOOK    Staphylococcus aureus (BCID) NOT DETECTED NOT DETECTED   Staphylococcus epidermidis DETECTED (A) NOT DETECTED    Comment: CRITICAL RESULT CALLED TO, READ BACK BY AND VERIFIED WITH: BAlease Amend PHARMD, AT 0827 10/22/23 D.VANHOOK    Staphylococcus lugdunensis NOT DETECTED NOT DETECTED    Streptococcus species NOT DETECTED NOT DETECTED   Streptococcus agalactiae NOT DETECTED NOT DETECTED   Streptococcus pneumoniae NOT DETECTED NOT DETECTED   Streptococcus pyogenes NOT DETECTED NOT DETECTED   A.calcoaceticus-baumannii NOT DETECTED NOT DETECTED   Bacteroides fragilis NOT DETECTED NOT DETECTED   Enterobacterales NOT DETECTED NOT DETECTED   Enterobacter cloacae complex NOT DETECTED NOT DETECTED   Escherichia coli NOT DETECTED NOT DETECTED   Klebsiella aerogenes NOT DETECTED NOT DETECTED   Klebsiella oxytoca NOT DETECTED NOT DETECTED   Klebsiella pneumoniae NOT DETECTED NOT DETECTED   Proteus species NOT DETECTED NOT DETECTED   Salmonella species NOT DETECTED NOT DETECTED   Serratia marcescens NOT DETECTED NOT DETECTED   Haemophilus influenzae NOT DETECTED NOT DETECTED   Neisseria meningitidis NOT DETECTED NOT DETECTED   Pseudomonas aeruginosa NOT DETECTED NOT DETECTED   Stenotrophomonas maltophilia NOT DETECTED NOT DETECTED   Candida albicans NOT DETECTED NOT DETECTED   Candida auris NOT DETECTED NOT DETECTED   Candida glabrata NOT DETECTED NOT DETECTED   Candida krusei NOT DETECTED NOT DETECTED   Candida parapsilosis NOT DETECTED NOT DETECTED   Candida tropicalis NOT DETECTED NOT DETECTED   Cryptococcus neoformans/gattii NOT DETECTED NOT DETECTED   Methicillin resistance mecA/C NOT DETECTED NOT DETECTED    Comment: Performed at [facility], [address] [place], [place] [address]  T-helper cells (CD4) count (not at [facility])     Status: Abnormal  Collection Time: 10/21/23  2:21 PM  Result Value Ref Range   CD4 T Cell Abs 170 (L) 400 - 1,790 /uL   CD4 % Helper T Cell 14 (L) 33 - 65 %    Comment: Performed at [facility], [address] [place], [place] [address]  CK     Status: None   Collection Time: 10/21/23  2:21 PM  Result Value Ref Range   Total CK 312 49 - 397 U/L    Comment: Performed at [facility], [address]  [place], [place] [address]  Culture, blood (routine x 2)     Status: None (Preliminary result)   Collection Time: 10/21/23  2:21 PM   Specimen: BLOOD LEFT ARM  Result Value Ref Range   Specimen Description BLOOD LEFT ARM    Special Requests      BOTTLES DRAWN AEROBIC AND ANAEROBIC Blood Culture adequate volume   Culture      NO GROWTH < 24 HOURS Performed at [facility], [address], [place], [place] [address]    Report Status PENDING   Phosphorus     Status: Abnormal   Collection Time: 10/21/23  2:33 PM  Result Value Ref Range   Phosphorus 6.0 (H) 2.5 - 4.6 mg/dL    Comment: Performed at [facility], [address], [place], [place] [address]  Creatinine, serum     Status: None   Collection Time: 10/21/23  2:33 PM  Result Value Ref Range   Creatinine, Ser 0.78 0.61 - 1.24 mg/dL   GFR, Estimated >65 >78 mL/min    Comment: (NOTE) Calculated using the CKD-EPI Creatinine Equation (2021) Performed at [facility], [address], [place], [place] [address]   CBC     Status: Abnormal   Collection Time: 10/21/23  7:26 PM  Result Value Ref Range   WBC 10.6 (H) 4.0 - 10.5 K/uL   RBC 4.67 4.22 - 5.81 MIL/uL   Hemoglobin 12.3 (L) 13.0 - 17.0 g/dL   HCT 95.2 (L) 84.1 - 32.4 %   MCV 71.5 (L) 80.0 - 100.0 fL   MCH 26.3 26.0 - 34.0 pg   MCHC 36.8 (H) 30.0 - 36.0 g/dL   RDW 40.1 02.7 - 25.3 %   Platelets 62 (L) 150 - 400 K/uL    Comment: Immature Platelet Fraction may be clinically indicated, consider ordering this additional test [id number] CONSISTENT WITH PREVIOUS RESULT REPEATED TO VERIFY    nRBC 0.0 0.0 - 0.2 %    Comment: Performed at [facility], [address] [place], [place] [address]  HIV-1 RNA quant-no reflex-bld     Status: None   Collection Time: 10/21/23  7:26 PM  Result Value Ref Range   HIV 1 RNA Quant 61,100 copies/mL    Comment: (NOTE) The reportable range for this assay is 20 to 10,000,000 copies HIV-1 RNA/mL.    LOG10 HIV-1 RNA  4.786 log10copy/mL    Comment: (NOTE) Performed At: [facility] [address] [place], [place] [id number] Pearlean Botts MD IR:[id number]   Magnesium      Status: Abnormal   Collection Time: 10/22/23  4:27 AM  Result Value Ref Range   Magnesium  1.4 (L) 1.7 - 2.4 mg/dL    Comment: Performed at [facility], [address], [place], [place] [address]  CBC     Status: Abnormal   Collection Time: 10/22/23  4:27 AM  Result Value Ref  Range   WBC 7.3 4.0 - 10.5 K/uL   RBC 4.17 (L) 4.22 - 5.81 MIL/uL   Hemoglobin 11.1 (L) 13.0 - 17.0 g/dL   HCT 40.9 (L) 81.1 - 91.4 %   MCV 72.7 (L) 80.0 - 100.0 fL   MCH 26.6 26.0 - 34.0 pg   MCHC 36.6 (H) 30.0 - 36.0 g/dL   RDW 78.2 95.6 - 21.3 %   Platelets 55 (L) 150 - 400 K/uL    Comment: Immature Platelet Fraction may be clinically indicated, consider ordering this additional test [id number] CONSISTENT WITH PREVIOUS RESULT REPEATED TO VERIFY    nRBC 0.0 0.0 - 0.2 %    Comment: Performed at [facility], [address] [place], [place] [address]  Comprehensive metabolic panel     Status: Abnormal   Collection Time: 10/22/23  4:27 AM  Result Value Ref Range   Sodium 128 (L) 135 - 145 mmol/L   Potassium 2.8 (L) 3.5 - 5.1 mmol/L   Chloride 85 (L) 98 - 111 mmol/L   CO2 23 22 - 32 mmol/L   Glucose, Bld 107 (H) 70 - 99 mg/dL    Comment: Glucose reference range applies only to samples taken after fasting for at least 8 hours.   BUN 6 6 - 20 mg/dL   Creatinine, Ser 2.84 0.61 - 1.24 mg/dL   Calcium  6.3 (LL) 8.9 - 10.3 mg/dL    Comment: CRITICAL RESULT CALLED TO, READ BACK BY AND VERIFIED WITH M. YORK RN @0600  10/22/2023 S. BYRD   Total Protein 6.0 (L) 6.5 - 8.1 g/dL   Albumin 2.0 (L) 3.5 - 5.0 g/dL   AST 132 (H) 15 - 41 U/L   ALT 180 (H) 0 - 44 U/L   Alkaline Phosphatase 181 (H) 38 - 126 U/L   Total Bilirubin 5.8 (H) 0.0 - 1.2 mg/dL   GFR, Estimated >44 >01 mL/min    Comment: (NOTE) Calculated using the CKD-EPI Creatinine  Equation (2021)    Anion gap 20 (H) 5 - 15    Comment: Performed at [facility], [address] [place], [place] [address]  Lipase, blood     Status: None   Collection Time: 10/22/23  8:51 AM  Result Value Ref Range   Lipase 21 11 - 51 U/L    Comment: Performed at [facility], [address], [place], [place] [address]  Hepatic function panel     Status: Abnormal   Collection Time: 10/22/23  3:08 PM  Result Value Ref Range   Total Protein 5.7 (L) 6.5 - 8.1 g/dL   Albumin 1.9 (L) 3.5 - 5.0 g/dL   AST 034 (H) 15 - 41 U/L   ALT 169 (H) 0 - 44 U/L   Alkaline Phosphatase 178 (H) 38 - 126 U/L   Total Bilirubin 5.5 (H) 0.0 - 1.2 mg/dL   Bilirubin, Direct 3.3 (H) 0.0 - 0.2 mg/dL   Indirect Bilirubin 2.2 (H) 0.3 - 0.9 mg/dL    Comment: Performed at [facility], [address], [place], [place] [address]  Magnesium      Status: Abnormal   Collection Time: 10/22/23  3:08 PM  Result Value Ref Range   Magnesium  2.7 (H) 1.7 - 2.4 mg/dL    Comment: Performed at [facility], [address], [place], [place] [address]  Phosphorus     Status: Abnormal   Collection Time: 10/22/23  3:08 PM  Result Value Ref Range  Phosphorus 5.1 (H) 2.5 - 4.6 mg/dL    Comment: Performed at [facility], [address], [place], [place] [address]  Lipase, blood     Status: None   Collection Time: 10/22/23  3:08 PM  Result Value Ref Range   Lipase 23 11 - 51 U/L    Comment: Performed at [facility], [address] [place], [place] [address]  Ammonia     Status: Abnormal   Collection Time: 10/22/23  3:08 PM  Result Value Ref Range   Ammonia 45 (H) 9 - 35 umol/L    Comment: Performed at [facility], [address] [place], [place] [address]  Protime-INR     Status: Abnormal   Collection Time: 10/22/23  3:08 PM  Result Value Ref Range   Prothrombin Time 16.0 (H) 11.4 - 15.2 seconds   INR 1.3 (H) 0.8 - 1.2    Comment: (NOTE) INR goal varies based on device and  disease states. Performed at [facility], [address] [place], [place] [address]     MICRO: Blood cx 1 of 4 bottles staph epi IMAGING: CT ABDOMEN PELVIS W CONTRAST Result Date: 10/21/2023 CLINICAL DATA:  n/v/elevated LFTs EXAM: CT ABDOMEN AND PELVIS WITH CONTRAST TECHNIQUE: Multidetector CT imaging of the abdomen and pelvis was performed using the standard protocol following bolus administration of intravenous contrast. RADIATION DOSE REDUCTION: This exam was performed according to the departmental dose-optimization program which includes automated exposure control, adjustment of the mA and/or kV according to patient size and/or use of iterative reconstruction technique. CONTRAST:  75mL OMNIPAQUE  IOHEXOL  350 MG/ML SOLN COMPARISON:  November 11, 2022 FINDINGS: Lower chest: No acute abnormality. Hepatobiliary: Profound hepatic steatosis. Cholelithiasis versus gallbladder wall calcifications of the fundus. No pericholecystic fluid. No intrahepatic or extrahepatic biliary ductal dilation. Pancreas: Unremarkable. No pancreatic ductal dilatation or surrounding inflammatory changes. However inferior to the pancreas and distinct from the pancreas ,there is ill-defined soft tissue density along the SMA and SMV. This measures approximately 29 x 15 by 22 mm (series 7, image 71; series 3, image 42 this ( Spleen: Unremarkable Adrenals/Urinary Tract: Adrenal glands are unremarkable. Kidneys enhance symmetrically. No hydronephrosis or obstructing nephrolithiasis. Bladder is unremarkable. Stomach/Bowel: No evidence of bowel obstruction. Colon is diffusely decompressed. There is circumferential wall prominence of the rectum. Appendix is normal. Submucosal fat throughout the majority of the colon. Stomach is decompressed Vascular/Lymphatic: Age advanced atherosclerotic calcifications of the nonaneurysmal abdominal aorta. Reproductive: Prostate is present. Other: No free air or free fluid. Musculoskeletal: No acute or  significant osseous findings. IMPRESSION: 1. Profound hepatic steatosis. Cholelithiasis versus gallbladder wall calcification without evidence of acute cholecystitis. 2. There is ill-defined soft tissue density along the SMA and SMV inferior to the pancreas. This could reflect sequela of prior pancreatitis, shotty lymphadenopathy or other nonspecific process such as fibromatosis. Recommend correlation with laboratory values. Recommend repeat CT abdomen pelvis with contrast within 3-6 months to assess for stability. Alternatively, further characterization could also be performed with dedicated outpatient abdominal MRI. 3. There is circumferential wall prominence of the rectum. This could reflect a nonspecific infectious or inflammatory colitis. Recommend correlation with symptoms. 4. Age advanced atherosclerotic calcifications of the nonaneurysmal abdominal aorta. Aortic Atherosclerosis (ICD10-I70.0). Electronically Signed   By: Clancy Crimes M.D.   On: 10/21/2023 12:50   DG Chest Portable 1 View Result Date: 10/21/2023 CLINICAL DATA:  Tachycardia.  Shakiness and chills. EXAM: PORTABLE CHEST 1 VIEW COMPARISON:  11/11/2022. FINDINGS: The  heart size and mediastinal contours are within normal limits. Chronic eventration of the right hemidiaphragm. Both lungs are clear. No pleural effusion or pneumothorax. The visualized skeletal structures are unremarkable. IMPRESSION: No acute cardiopulmonary findings. Electronically Signed   By: Mannie Seek M.D.   On: 10/21/2023 12:21     Assessment/Plan:  33yo M with poorly controlled hiv disease, with thrush on admission but presents with alcohol  withdrawal and concern for alcoholic hepatitis with transaminitis, but also thrombocytopenia as reflection of liver disease.  imaging of abdomen shows prior pancreatitis, and inflammatory/infectious colitis at rectum. - for the time being, restart biktarvy  - check cd 4 count and hiv viral load - will start on nystatin  swish and swallow to treat thrush. Avoided fluconazole  given his transaminitis - check ammonia since slightly disoriented on discussion with patient - once more stable, recommend for gi to see to do EGD concern for nausea and vomiting and alcoholic related gastritis - transaminitis = follow LFTS to see if improving - will check rpr, and check for gc/chlamydia to see if needs treatment.  Gerold Kos Levern Reader MD MPH [facility] [phone]

## 2023-10-22 NOTE — Plan of Care (Signed)

## 2023-10-22 NOTE — Progress Notes (Addendum)
 PHARMACY - PHYSICIAN COMMUNICATION CRITICAL VALUE ALERT - BLOOD CULTURE IDENTIFICATION (BCID)  Harold Flores is an 33 y.o. male who presented to [facility] on 10/21/2023 with a chief complaint of vomiting/shaking/chills in the setting of EtOH withdrawal.   Assessment: BCID positive for staphylococcus epidermidis (no resistance detected) with 2/4 bottles (anaerobic bottle only) with gram positive rods   Name of physician (or Provider) Contacted: Dr. Osborne Blazer (via secure chat)  Current antibiotics: IV ceftriaxone and IV metronidazole    Changes to prescribed antibiotics recommended: Patient is currently on IV antibiotics empirically (concern for possible intraabdominal infection) therefore no changes recommended as staph epi is likely contaminant.    Results for orders placed or performed during the hospital encounter of 10/21/23  Blood Culture ID Panel (Reflexed) (Collected: 10/21/2023  1:30 PM)  Result Value Ref Range   Enterococcus faecalis NOT DETECTED NOT DETECTED   Enterococcus Faecium NOT DETECTED NOT DETECTED   Listeria monocytogenes NOT DETECTED NOT DETECTED   Staphylococcus species DETECTED (A) NOT DETECTED   Staphylococcus aureus (BCID) NOT DETECTED NOT DETECTED   Staphylococcus epidermidis DETECTED (A) NOT DETECTED   Staphylococcus lugdunensis NOT DETECTED NOT DETECTED   Streptococcus species NOT DETECTED NOT DETECTED   Streptococcus agalactiae NOT DETECTED NOT DETECTED   Streptococcus pneumoniae NOT DETECTED NOT DETECTED   Streptococcus pyogenes NOT DETECTED NOT DETECTED   A.calcoaceticus-baumannii NOT DETECTED NOT DETECTED   Bacteroides fragilis NOT DETECTED NOT DETECTED   Enterobacterales NOT DETECTED NOT DETECTED   Enterobacter cloacae complex NOT DETECTED NOT DETECTED   Escherichia coli NOT DETECTED NOT DETECTED   Klebsiella aerogenes NOT DETECTED NOT DETECTED   Klebsiella oxytoca NOT DETECTED NOT DETECTED   Klebsiella pneumoniae NOT DETECTED NOT DETECTED    Proteus species NOT DETECTED NOT DETECTED   Salmonella species NOT DETECTED NOT DETECTED   Serratia marcescens NOT DETECTED NOT DETECTED   Haemophilus influenzae NOT DETECTED NOT DETECTED   Neisseria meningitidis NOT DETECTED NOT DETECTED   Pseudomonas aeruginosa NOT DETECTED NOT DETECTED   Stenotrophomonas maltophilia NOT DETECTED NOT DETECTED   Candida albicans NOT DETECTED NOT DETECTED   Candida auris NOT DETECTED NOT DETECTED   Candida glabrata NOT DETECTED NOT DETECTED   Candida krusei NOT DETECTED NOT DETECTED   Candida parapsilosis NOT DETECTED NOT DETECTED   Candida tropicalis NOT DETECTED NOT DETECTED   Cryptococcus neoformans/gattii NOT DETECTED NOT DETECTED   Methicillin resistance mecA/C NOT DETECTED NOT DETECTED    Monica Ann 10/22/2023  8:50 AM

## 2023-10-23 DIAGNOSIS — K529 Noninfective gastroenteritis and colitis, unspecified: Secondary | ICD-10-CM

## 2023-10-23 DIAGNOSIS — F10139 Alcohol abuse with withdrawal, unspecified: Secondary | ICD-10-CM | POA: Diagnosis not present

## 2023-10-23 DIAGNOSIS — K7010 Alcoholic hepatitis without ascites: Secondary | ICD-10-CM | POA: Diagnosis not present

## 2023-10-23 DIAGNOSIS — B20 Human immunodeficiency virus [HIV] disease: Secondary | ICD-10-CM | POA: Diagnosis not present

## 2023-10-23 DIAGNOSIS — L8990 Pressure ulcer of unspecified site, unspecified stage: Secondary | ICD-10-CM | POA: Insufficient documentation

## 2023-10-23 DIAGNOSIS — K6289 Other specified diseases of anus and rectum: Secondary | ICD-10-CM

## 2023-10-23 DIAGNOSIS — F10930 Alcohol use, unspecified with withdrawal, uncomplicated: Secondary | ICD-10-CM | POA: Diagnosis not present

## 2023-10-23 DIAGNOSIS — R112 Nausea with vomiting, unspecified: Secondary | ICD-10-CM

## 2023-10-23 DIAGNOSIS — B370 Candidal stomatitis: Secondary | ICD-10-CM | POA: Diagnosis not present

## 2023-10-23 LAB — CBC
HCT: 30.1 % — ABNORMAL LOW (ref 39.0–52.0)
Hemoglobin: 10.8 g/dL — ABNORMAL LOW (ref 13.0–17.0)
MCH: 26.9 pg (ref 26.0–34.0)
MCHC: 35.9 g/dL (ref 30.0–36.0)
MCV: 74.9 fL — ABNORMAL LOW (ref 80.0–100.0)
Platelets: 70 10*3/uL — ABNORMAL LOW (ref 150–400)
RBC: 4.02 MIL/uL — ABNORMAL LOW (ref 4.22–5.81)
RDW: 15 % (ref 11.5–15.5)
WBC: 5.5 10*3/uL (ref 4.0–10.5)
nRBC: 0 % (ref 0.0–0.2)

## 2023-10-23 LAB — BASIC METABOLIC PANEL WITH GFR
Anion gap: 11 (ref 5–15)
BUN: 5 mg/dL — ABNORMAL LOW (ref 6–20)
CO2: 26 mmol/L (ref 22–32)
Calcium: 7.2 mg/dL — ABNORMAL LOW (ref 8.9–10.3)
Chloride: 93 mmol/L — ABNORMAL LOW (ref 98–111)
Creatinine, Ser: 0.97 mg/dL (ref 0.61–1.24)
GFR, Estimated: >60 mL/min (ref >60)
Glucose, Bld: 86 mg/dL (ref 70–99)
Potassium: 3.4 mmol/L — ABNORMAL LOW (ref 3.5–5.1)
Sodium: 130 mmol/L — ABNORMAL LOW (ref 135–145)

## 2023-10-23 LAB — HEPATIC FUNCTION PANEL
ALT: 166 U/L — ABNORMAL HIGH (ref 0–44)
AST: 331 U/L — ABNORMAL HIGH (ref 15–41)
Albumin: 2 g/dL — ABNORMAL LOW (ref 3.5–5.0)
Alkaline Phosphatase: 205 U/L — ABNORMAL HIGH (ref 38–126)
Bilirubin, Direct: 3.7 mg/dL — ABNORMAL HIGH (ref 0.0–0.2)
Indirect Bilirubin: 2.6 mg/dL — ABNORMAL HIGH (ref 0.3–0.9)
Total Bilirubin: 6.3 mg/dL — ABNORMAL HIGH (ref 0.0–1.2)
Total Protein: 6.3 g/dL — ABNORMAL LOW (ref 6.5–8.1)

## 2023-10-23 LAB — AMMONIA: Ammonia: 43 umol/L — ABNORMAL HIGH (ref 9–35)

## 2023-10-23 LAB — MAGNESIUM: Magnesium: 1.9 mg/dL (ref 1.7–2.4)

## 2023-10-23 MED ORDER — PANTOPRAZOLE SODIUM 40 MG IV SOLR
40.0000 mg | Freq: Two times a day (BID) | INTRAVENOUS | Status: DC
  Administered 2023-10-23 – 2023-10-26 (×7): 40 mg via INTRAVENOUS
  Filled 2023-10-23 (×7): qty 10

## 2023-10-23 MED ORDER — ONDANSETRON HCL 4 MG/2ML IJ SOLN
4.0000 mg | Freq: Four times a day (QID) | INTRAMUSCULAR | Status: DC | PRN
  Administered 2023-10-23: 4 mg via INTRAVENOUS
  Filled 2023-10-23: qty 2

## 2023-10-23 MED ORDER — MAGNESIUM SULFATE IN D5W 1-5 GM/100ML-% IV SOLN
1.0000 g | Freq: Once | INTRAVENOUS | Status: AC
  Administered 2023-10-23: 1 g via INTRAVENOUS
  Filled 2023-10-23: qty 100

## 2023-10-23 MED ORDER — DOXYCYCLINE HYCLATE 100 MG PO TABS
100.0000 mg | ORAL_TABLET | Freq: Two times a day (BID) | ORAL | Status: DC
  Administered 2023-10-23 – 2023-10-27 (×8): 100 mg via ORAL
  Filled 2023-10-23 (×8): qty 1

## 2023-10-23 MED ORDER — LACTULOSE 10 GM/15ML PO SOLN
20.0000 g | Freq: Two times a day (BID) | ORAL | Status: DC
  Administered 2023-10-23 – 2023-10-25 (×5): 20 g via ORAL
  Filled 2023-10-23 (×5): qty 30

## 2023-10-23 MED ORDER — POTASSIUM CHLORIDE CRYS ER 20 MEQ PO TBCR
40.0000 meq | EXTENDED_RELEASE_TABLET | Freq: Four times a day (QID) | ORAL | Status: AC
  Administered 2023-10-23 (×2): 40 meq via ORAL
  Filled 2023-10-23 (×2): qty 2

## 2023-10-23 MED ORDER — NYSTATIN 100000 UNIT/ML MT SUSP
5.0000 mL | Freq: Four times a day (QID) | OROMUCOSAL | Status: DC
  Administered 2023-10-23 – 2023-10-27 (×16): 500000 [IU] via ORAL
  Filled 2023-10-23 (×16): qty 5

## 2023-10-23 MED ORDER — MAGIC MOUTHWASH
5.0000 mL | Freq: Four times a day (QID) | ORAL | Status: DC
  Administered 2023-10-23 – 2023-10-27 (×17): 5 mL via ORAL
  Filled 2023-10-23 (×18): qty 5

## 2023-10-23 MED ORDER — ALBUMIN HUMAN 25 % IV SOLN
25.0000 g | Freq: Four times a day (QID) | INTRAVENOUS | Status: AC
  Administered 2023-10-23: 12.5 g via INTRAVENOUS
  Administered 2023-10-23 – 2023-10-24 (×3): 25 g via INTRAVENOUS
  Filled 2023-10-23 (×4): qty 100

## 2023-10-23 NOTE — Progress Notes (Addendum)
 PROGRESS NOTE    DRELYN ADDY  RJJ:[id number] DOB: [birth year] DOA: 10/21/2023 PCP: Roslyn Coombe, MD    Chief Complaint  Patient presents with   Emesis    Brief Narrative:   Patient is a 33 y.o. male with history of HIV, HTN, alcohol  use-presented with fatigue/weakness nausea, vomiting, secondary to alcohol  abuse and withdrawals.  Assessment & Plan:   Principal Problem:   Alcohol  withdrawal (HCC)  Actable nausea, vomiting and diarrhea Failure to thrive  -Likely due to alcohol  abuse, and early withdrawals - Nausea has improved, advance to regular diet, but he did develop again some nausea and vomiting in the afternoon  on IV Protonix  40 mg twice daily. - Monitor closely for refeeding syndrome - Lipase within normal limit  Alcohol  dependence with withdrawals -Continue with CIWA protocol, thiamine  and folic acid  -scheduled Librium  Hyponatremia -Hypovolemic, improvingwith IV fluids  Severe hypokalemia -Continue to replace  Hypomagnesemia Hypocalcemia -Replaced, monitor closely  Hyperphosphatemia - Monitor phosphorus closely as high risk for refeeding syndrome  Transaminitis Hyperilirubinemia Thrombocytopenia Hyperammonemia - This is all due to heavy alcohol  abuse, continue with supportive therapy -LFTs trending down but alk phos and total bili trending up, but no evidence of biliary duct dilation, or right upper quadrant tenderness, continue to monitor closely -Started on low-dose lactulose, follow ammonia level intermittently -Will check hepatitis panel   HIV/AIDS Management per ID Does appear he is with poor compliance,  resumed on Biktarvy  HIV quantitative 61,000 copies, D40 170  2/4 blood cultures growing gram-positive cocci 1/4 gram-positive rods -Unclear significance, culture growing Staphylococcus Dermidex, most likely contaminant, will repeat blood cultures today  Proctitis - Evidence on imaging, initially on IV Rocephin and Flagyl , currently  on doxycycline .  Bilateral upper extremity erythema - Patient reports status post peeling at the salon recently, does not appear to be infected, will continue to monitor  Abnormal finding in pancreas on imaging - Further workup can be pursued as an outpatient  NSVT - Noted overnight NSVT, symptomatic, correct electrolyte derangement, continue with beta-blockers, will obtain the echo    DVT prophylaxis: SCD Code Status: Full Family Communication: none at ebdside Disposition:   Status is: Inpatient    Consultants:  ID   Subjective: No nausea, no vomiting, good clear liquid intake yesterday, advance to regular diet, had nausea and small amount of vomiting after that. Objective: Vitals:   10/23/23 0817 10/23/23 0820 10/23/23 0900 10/23/23 1238  BP:  (!) 87/56 98/61 103/74  Pulse:      Resp:  19 18 19   Temp: 98 F (36.7 C) 98 F (36.7 C) 98 F (36.7 C) (!) 97.5 F (36.4 C)  TempSrc: Oral Oral Oral Oral  SpO2:  100% 100%   Weight:      Height:        Intake/Output Summary (Last 24 hours) at 10/23/2023 1428 Last data filed at 10/23/2023 1314 Gross per 24 hour  Intake 1220 ml  Output --  Net 1220 ml   Filed Weights   10/21/23 1942  Weight: 87.4 kg    Examination:  Awake Alert, Oriented X 3, No new F.N deficits, Normal affect, jaundiced Symmetrical Chest wall movement, Good air movement bilaterally, CTAB RRR,No Gallops,Rubs or new Murmurs, No Parasternal Heave +ve B.Sounds, Abd Soft, No tenderness, No rebound - guarding or rigidity. No Cyanosis, Clubbing or edema, No new Rash or bruise        Data Reviewed: I have personally reviewed following labs and imaging studies  CBC: Recent Labs  Lab 10/21/23 1010 10/21/23 1926 10/22/23 0427 10/23/23 0903  WBC 9.7 10.6* 7.3 5.5  NEUTROABS 6.3  --   --   --   HGB 12.9* 12.3* 11.1* 10.8*  HCT 34.4* 33.4* 30.3* 30.1*  MCV 71.5* 71.5* 72.7* 74.9*  PLT 65* 62* 55* 70*    Basic Metabolic Panel: Recent Labs   Lab 10/21/23 1010 10/21/23 1433 10/22/23 0427 10/22/23 1508 10/23/23 0432  NA 125*  --  128*  --  130*  K 3.1*  --  2.8*  --  3.4*  CL 79*  --  85*  --  93*  CO2 24  --  23  --  26  GLUCOSE 103*  --  107*  --  86  BUN 5*  --  6  --  5*  CREATININE 0.89 0.78 1.16  --  0.97  CALCIUM  6.8*  --  6.3*  --  7.2*  MG 0.7*  --  1.4* 2.7* 1.9  PHOS  --  6.0*  --  5.1*  --     GFR: Estimated Creatinine Clearance: 112.9 mL/min (by C-G formula based on SCr of 0.97 mg/dL).  Liver Function Tests: Recent Labs  Lab 10/21/23 1010 10/22/23 0427 10/22/23 1508 10/23/23 0903  AST 486* 437* 380* 331*  ALT 212* 180* 169* 166*  ALKPHOS 184* 181* 178* 205*  BILITOT 5.8* 5.8* 5.5* 6.3*  PROT 6.7 6.0* 5.7* 6.3*  ALBUMIN 2.3* 2.0* 1.9* 2.0*    CBG: Recent Labs  Lab 10/21/23 1033  GLUCAP 109*     Recent Results (from the past 240 hours)  Culture, blood (routine x 2)     Status: None (Preliminary result)   Collection Time: 10/21/23  1:30 PM   Specimen: BLOOD RIGHT ARM  Result Value Ref Range Status   Specimen Description BLOOD RIGHT ARM  Final   Special Requests   Final    BOTTLES DRAWN AEROBIC AND ANAEROBIC Blood Culture results may not be optimal due to an inadequate volume of blood received in culture bottles   Culture  Setup Time   Final    GRAM POSITIVE COCCI IN BOTH AEROBIC AND ANAEROBIC BOTTLES CRITICAL RESULT CALLED TO, READ BACK BY AND VERIFIED WITHNorberta Beans PHARMD, AT 0827 10/22/23 D.VANHOOK GRAM POSITIVE RODS AEROBIC BOTTLE ONLY CRITICAL RESULT CALLED TO, READ BACK BY AND VERIFIED WITH: PHARMD BOBBY JO STONER ON 10/22/23 @ 1336 BY DRT    Culture   Final    CULTURE REINCUBATED FOR BETTER GROWTH STAPHYLOCOCCUS EPIDERMIDIS THE SIGNIFICANCE OF ISOLATING THIS ORGANISM FROM A SINGLE SET OF BLOOD CULTURES WHEN MULTIPLE SETS ARE DRAWN IS UNCERTAIN. PLEASE NOTIFY THE MICROBIOLOGY DEPARTMENT WITHIN ONE WEEK IF SPECIATION AND SENSITIVITIES ARE REQUIRED. Performed at [facility], [address], [place], [place] [address]    Report Status PENDING  Incomplete  Blood Culture ID Panel (Reflexed)     Status: Abnormal   Collection Time: 10/21/23  1:30 PM  Result Value Ref Range Status   Enterococcus faecalis NOT DETECTED NOT DETECTED Final   Enterococcus Faecium NOT DETECTED NOT DETECTED Final   Listeria monocytogenes NOT DETECTED NOT DETECTED Final   Staphylococcus species DETECTED (A) NOT DETECTED Final    Comment: CRITICAL RESULT CALLED TO, READ BACK BY AND VERIFIED WITH: BAlease Amend PHARMD, AT 0827 10/22/23 D.VANHOOK    Staphylococcus aureus (BCID) NOT DETECTED NOT DETECTED Final   Staphylococcus epidermidis DETECTED (A) NOT DETECTED Final    Comment: CRITICAL RESULT CALLED TO, READ BACK  BY AND VERIFIED WITHNorberta Beans PHARMD, AT 0827 10/22/23 D.VANHOOK    Staphylococcus lugdunensis NOT DETECTED NOT DETECTED Final   Streptococcus species NOT DETECTED NOT DETECTED Final   Streptococcus agalactiae NOT DETECTED NOT DETECTED Final   Streptococcus pneumoniae NOT DETECTED NOT DETECTED Final   Streptococcus pyogenes NOT DETECTED NOT DETECTED Final   A.calcoaceticus-baumannii NOT DETECTED NOT DETECTED Final   Bacteroides fragilis NOT DETECTED NOT DETECTED Final   Enterobacterales NOT DETECTED NOT DETECTED Final   Enterobacter cloacae complex NOT DETECTED NOT DETECTED Final   Escherichia coli NOT DETECTED NOT DETECTED Final   Klebsiella aerogenes NOT DETECTED NOT DETECTED Final   Klebsiella oxytoca NOT DETECTED NOT DETECTED Final   Klebsiella pneumoniae NOT DETECTED NOT DETECTED Final   Proteus species NOT DETECTED NOT DETECTED Final   Salmonella species NOT DETECTED NOT DETECTED Final   Serratia marcescens NOT DETECTED NOT DETECTED Final   Haemophilus influenzae NOT DETECTED NOT DETECTED Final   Neisseria meningitidis NOT DETECTED NOT DETECTED Final   Pseudomonas aeruginosa NOT DETECTED NOT DETECTED Final   Stenotrophomonas maltophilia NOT DETECTED NOT  DETECTED Final   Candida albicans NOT DETECTED NOT DETECTED Final   Candida auris NOT DETECTED NOT DETECTED Final   Candida glabrata NOT DETECTED NOT DETECTED Final   Candida krusei NOT DETECTED NOT DETECTED Final   Candida parapsilosis NOT DETECTED NOT DETECTED Final   Candida tropicalis NOT DETECTED NOT DETECTED Final   Cryptococcus neoformans/gattii NOT DETECTED NOT DETECTED Final   Methicillin resistance mecA/C NOT DETECTED NOT DETECTED Final    Comment: Performed at [facility], [address], [place], [place] [address]  Culture, blood (routine x 2)     Status: None (Preliminary result)   Collection Time: 10/21/23  2:21 PM   Specimen: BLOOD LEFT ARM  Result Value Ref Range Status   Specimen Description BLOOD LEFT ARM  Final   Special Requests   Final    BOTTLES DRAWN AEROBIC AND ANAEROBIC Blood Culture adequate volume   Culture   Final    NO GROWTH 2 DAYS Performed at [facility], [address] [place], [place] [address]    Report Status PENDING  Incomplete         Radiology Studies: No results found.       Scheduled Meds:  bictegravir-emtricitabine -tenofovir  AF  1 tablet Oral Daily   carvedilol   6.25 mg Oral BID WC   chlordiazePOXIDE  10 mg Oral QID   doxycycline   100 mg Oral BID WC   famotidine   20 mg Oral BID   folic acid   1 mg Oral Daily   magic mouthwash  5 mL Oral QID   multivitamin with minerals  1 tablet Oral Q lunch   nystatin  5 mL Oral QID   pantoprazole  (PROTONIX ) IV  40 mg Intravenous Q12H   thiamine   100 mg Oral Daily   Or   thiamine   100 mg Intravenous Daily   Continuous Infusions:     LOS: 2 days       Seena Dadds, MD [facility]   To contact the attending provider between 7A-7P or the covering provider during after hours 7P-7A, please log into the web site [email or web address] and access using universal [place] password for that web site. If you do not have the password, please call the hospital  operator.  10/23/2023, 2:28 PM

## 2023-10-23 NOTE — Plan of Care (Signed)

## 2023-10-23 NOTE — Progress Notes (Signed)
 [facility] night cross cover note:   I was notified by the patient's RN nonsustained V. tach, asymptomatic, vital signs otherwise stable.  I added BMP and magnesium  level to the morning labs.     Camelia Cavalier, DO Hospitalist

## 2023-10-23 NOTE — Progress Notes (Signed)
 [facility]    Date of Admission:  10/21/2023     ID: Harold Flores is a 33 y.o. male with HIV disease Principal Problem:   Alcohol  withdrawal (HCC)    Subjective: Less nausea and vomiting and diarrhea is much improved. Has pruritic rash to arms /torso that he states has been there for length of time  Medications:   bictegravir-emtricitabine -tenofovir  AF  1 tablet Oral Daily   carvedilol   6.25 mg Oral BID WC   chlordiazePOXIDE  10 mg Oral QID   doxycycline   100 mg Oral BID WC   famotidine   20 mg Oral BID   folic acid   1 mg Oral Daily   magic mouthwash  5 mL Oral QID   multivitamin with minerals  1 tablet Oral Q lunch   nystatin  5 mL Oral QID   pantoprazole  (PROTONIX ) IV  40 mg Intravenous Q12H   thiamine   100 mg Oral Daily   Or   thiamine   100 mg Intravenous Daily    Objective: Vital signs in last 24 hours: Temp:  [97.5 F (36.4 C)-98.5 F (36.9 C)] 97.5 F (36.4 C) (05/15 1238) Pulse Rate:  [99-104] 99 (05/15 0000) Resp:  [18-21] 19 (05/15 1238) BP: (87-111)/(56-84) 103/74 (05/15 1238) SpO2:  [100 %] 100 % (05/15 0900)  Physical Exam  Constitutional: He is oriented to person, place, and time. He appears well-developed and well-nourished. No distress.  HENT: +scleral icterus Mouth/Throat: Oropharynx is clear and moist. No oropharyngeal exudate.  Cardiovascular: Normal rate, regular rhythm and normal heart sounds. Exam reveals no gallop and no friction rub.  No murmur heard.  Pulmonary/Chest: Effort normal and breath sounds normal. No respiratory distress. He has no wheezes.  Abdominal: Soft. Bowel sounds are normal. He exhibits no distension. There is no tenderness.  Neurological: He is alert and oriented to person, place, and time.  Skin: Skin is warm and dry. Hyperpigmented rash to arms. No erythema.  Psychiatric: He has a normal mood and affect. His behavior is normal.    Lab Results Recent Labs    10/22/23 0427  10/23/23 0432 10/23/23 0903  WBC 7.3  --  5.5  HGB 11.1*  --  10.8*  HCT 30.3*  --  30.1*  NA 128* 130*  --   K 2.8* 3.4*  --   CL 85* 93*  --   CO2 23 26  --   BUN 6 5*  --   CREATININE 1.16 0.97  --    Liver Panel Recent Labs    10/22/23 1508 10/23/23 0903  PROT 5.7* 6.3*  ALBUMIN 1.9* 2.0*  AST 380* 331*  ALT 169* 166*  ALKPHOS 178* 205*  BILITOT 5.5* 6.3*  BILIDIR 3.3* 3.7*  IBILI 2.2* 2.6*   Sedimentation Rate No results for input(s): "ESRSEDRATE" in the last 72 hours. C-Reactive Protein No results for input(s): "CRP" in the last 72 hours.  Microbiology: reviewed Studies/Results: No results found.   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 Tbili also slowly trending downward  Colitis/inflammation about recturm = concern for sti related infection. Will change to doxycycline  empirically to take with meals to cut down on nausea side effect  Sti risk = will check for rpr, gc/chlam  Etoh abuse = continue on ciwa and watch for further withdrawal signs/sx  [facility] Pager: [phone]  10/23/2023, 2:06 PM

## 2023-10-23 NOTE — TOC CM/SW Note (Signed)
 Transition of Care [facility]) - Inpatient Brief Assessment   Patient Details  Name: Harold Flores MRN: [id number] Date of Birth: [birth year]  [facility] CM/SW Contact:    Jannice Mends, LCSW Phone Number: 10/23/2023, 3:41 PM   Clinical Narrative: Patient admitted from home with alcohol  withdrawal. Resources have been provided. No current TOC needs identified at this time.    Transition of Care Asessment: Insurance and Status: Insurance coverage has been reviewed Patient has primary care physician: Yes Home environment has been reviewed: From home Prior level of function:: Independent Prior/Current Home Services: No current home services Social Drivers of Health Review: SDOH reviewed no interventions necessary Readmission risk has been reviewed: Yes Transition of care needs: no transition of care needs at this time

## 2023-10-24 ENCOUNTER — Inpatient Hospital Stay (HOSPITAL_COMMUNITY)

## 2023-10-24 DIAGNOSIS — F10930 Alcohol use, unspecified with withdrawal, uncomplicated: Secondary | ICD-10-CM | POA: Diagnosis not present

## 2023-10-24 DIAGNOSIS — I4729 Other ventricular tachycardia: Secondary | ICD-10-CM | POA: Diagnosis not present

## 2023-10-24 LAB — ECHOCARDIOGRAM COMPLETE
AR max vel: 3.31 cm2
AV Area VTI: 3.31 cm2
AV Area mean vel: 2.99 cm2
AV Mean grad: 3 mmHg
AV Peak grad: 5.4 mmHg
Ao pk vel: 1.16 m/s
Calc EF: 61.5 %
Height: 70 in
S' Lateral: 3.3 cm
Single Plane A2C EF: 59.7 %
Single Plane A4C EF: 64.9 %
Weight: 3082.91 [oz_av]

## 2023-10-24 LAB — COMPREHENSIVE METABOLIC PANEL WITH GFR
ALT: 128 U/L — ABNORMAL HIGH (ref 0–44)
AST: 232 U/L — ABNORMAL HIGH (ref 15–41)
Albumin: 2.4 g/dL — ABNORMAL LOW (ref 3.5–5.0)
Alkaline Phosphatase: 190 U/L — ABNORMAL HIGH (ref 38–126)
Anion gap: 12 (ref 5–15)
BUN: <5 mg/dL — ABNORMAL LOW (ref 6–20)
CO2: 24 mmol/L (ref 22–32)
Calcium: 7.9 mg/dL — ABNORMAL LOW (ref 8.9–10.3)
Chloride: 98 mmol/L (ref 98–111)
Creatinine, Ser: 0.91 mg/dL (ref 0.61–1.24)
GFR, Estimated: >60 mL/min (ref >60)
Glucose, Bld: 94 mg/dL (ref 70–99)
Potassium: 3.4 mmol/L — ABNORMAL LOW (ref 3.5–5.1)
Sodium: 134 mmol/L — ABNORMAL LOW (ref 135–145)
Total Bilirubin: 5.7 mg/dL — ABNORMAL HIGH (ref 0.0–1.2)
Total Protein: 5.8 g/dL — ABNORMAL LOW (ref 6.5–8.1)

## 2023-10-24 LAB — MAGNESIUM: Magnesium: 1.5 mg/dL — ABNORMAL LOW (ref 1.7–2.4)

## 2023-10-24 LAB — CBC
HCT: 26.9 % — ABNORMAL LOW (ref 39.0–52.0)
Hemoglobin: 9.3 g/dL — ABNORMAL LOW (ref 13.0–17.0)
MCH: 26.3 pg (ref 26.0–34.0)
MCHC: 34.6 g/dL (ref 30.0–36.0)
MCV: 76.2 fL — ABNORMAL LOW (ref 80.0–100.0)
Platelets: 82 10*3/uL — ABNORMAL LOW (ref 150–400)
RBC: 3.53 MIL/uL — ABNORMAL LOW (ref 4.22–5.81)
RDW: 15.6 % — ABNORMAL HIGH (ref 11.5–15.5)
WBC: 5.1 10*3/uL (ref 4.0–10.5)
nRBC: 0 % (ref 0.0–0.2)

## 2023-10-24 LAB — PHOSPHORUS: Phosphorus: 1.8 mg/dL — ABNORMAL LOW (ref 2.5–4.6)

## 2023-10-24 LAB — AMMONIA: Ammonia: 49 umol/L — ABNORMAL HIGH (ref 9–35)

## 2023-10-24 LAB — RPR: RPR Ser Ql: NONREACTIVE

## 2023-10-24 LAB — CULTURE, BLOOD (ROUTINE X 2)

## 2023-10-24 LAB — HEPATITIS PANEL, ACUTE
HCV Ab: NONREACTIVE
Hep A IgM: NONREACTIVE
Hep B C IgM: NONREACTIVE
Hepatitis B Surface Ag: NONREACTIVE

## 2023-10-24 MED ORDER — POTASSIUM CHLORIDE CRYS ER 10 MEQ PO TBCR
30.0000 meq | EXTENDED_RELEASE_TABLET | Freq: Four times a day (QID) | ORAL | Status: AC
  Administered 2023-10-24 (×2): 30 meq via ORAL
  Filled 2023-10-24 (×2): qty 3

## 2023-10-24 MED ORDER — MAGNESIUM SULFATE 4 GM/100ML IV SOLN
4.0000 g | Freq: Once | INTRAVENOUS | Status: AC
  Administered 2023-10-24: 4 g via INTRAVENOUS
  Filled 2023-10-24: qty 100

## 2023-10-24 MED ORDER — K PHOS MONO-SOD PHOS DI & MONO 155-852-130 MG PO TABS
500.0000 mg | ORAL_TABLET | Freq: Four times a day (QID) | ORAL | Status: AC
  Administered 2023-10-24 (×4): 500 mg via ORAL
  Filled 2023-10-24 (×4): qty 2

## 2023-10-24 MED ORDER — SULFAMETHOXAZOLE-TRIMETHOPRIM 800-160 MG PO TABS
1.0000 | ORAL_TABLET | Freq: Every day | ORAL | Status: DC
  Administered 2023-10-24 – 2023-10-27 (×4): 1 via ORAL
  Filled 2023-10-24 (×4): qty 1

## 2023-10-24 NOTE — Progress Notes (Signed)
 PROGRESS NOTE    Harold Flores  WUJ:[id number] DOB: [birth year] DOA: 10/21/2023 PCP: Roslyn Coombe, MD    Chief Complaint  Patient presents with   Emesis    Brief Narrative:   Patient is a 33 y.o. male with history of HIV, HTN, alcohol  use-presented with fatigue/weakness nausea, vomiting, secondary to alcohol  abuse and withdrawals.  Assessment & Plan:   Principal Problem:   Alcohol  withdrawal (HCC) Active Problems:   Colitis   Nausea vomiting and diarrhea   Hyperbilirubinemia   Alcoholic hepatitis without ascites   Pressure injury of skin  Actable nausea, vomiting and diarrhea Failure to thrive  -Likely due to alcohol  abuse, and early withdrawals - Nausea has improved, advance to regular diet, but he did develop again some nausea and vomiting in the afternoon  on IV Protonix  40 mg twice daily. - Monitor closely for refeeding syndrome - Lipase within normal limit  Alcohol  dependence with withdrawals -Continue with CIWA protocol, thiamine  and folic acid  -scheduled Librium  Hyponatremia -Hypovolemic, improving with IV fluids  Severe hypokalemia Hypophosphatemia Hypomagnesemia - Remains low this morning, continue with aggressive replacement.  Was (he presented with hyperphosphatemia currently hypophosphatemia, continue to monitor closely for refeeding syndrome)  Hypocalcemia -Replaced, monitor closely  Hyperphosphatemia - Monitor phosphorus closely as high risk for refeeding syndrome  Transaminitis Hyperilirubinemia Thrombocytopenia Hyperammonemia - This is all due to heavy alcohol  abuse, continue with supportive therapy -LFTs trending down but alk phos and total bili trending up, but no evidence of biliary duct dilation, or right upper quadrant tenderness, continue to monitor closely -Started on low-dose lactulose, will increase given ammonia level trending up) follow ammonia level intermittently - Negative hepatitis panel   HIV/AIDS Management per  ID Does appear he is with poor compliance,  resumed on Biktarvy  HIV quantitative 61,000 copies, CD40 170 Noted on Bactrim  for prophylaxis  Oral thrush -Continue with Magic mouthwash  2/4 blood cultures growing gram-positive cocci 1/4 gram-positive rods -Unclear significance, culture growing Staphylococcus Dermidex, most likely contaminant,  Proctitis - Evidence on imaging, initially on IV Rocephin and Flagyl , currently on doxycycline .  Follow-up on STI including chlamydia and gonorrhea  Bilateral upper extremity erythema - Patient reports status post peeling at the salon recently, does not appear to be infected, will continue to monitor - This has resolved  Abnormal finding in pancreas on imaging - Further workup can be pursued as an outpatient  NSVT - Noted overnight NSVT, symptomatic, correct electrolyte derangement, continue with beta-blockers, 2D echo with a preserved EF  Macrocytic anemia -Likely due to alcohol  abuse - Check B12 and folic acid   DVT prophylaxis: SCD given thrombocytopenia, encouraged to ambulate. Code Status: Full Family Communication: none at ebdside Disposition:   Status is: Inpatient    Consultants:  ID   Subjective: No nausea, no vomiting, reports diarrhea most likely due to lactulose Objective: Vitals:   10/23/23 2350 10/24/23 0341 10/24/23 0738 10/24/23 1130  BP: (!) 90/43 101/65 116/77 103/73  Pulse:      Resp: 17 20 14 19   Temp: 98.8 F (37.1 C) 98.7 F (37.1 C) 98 F (36.7 C) 98.4 F (36.9 C)  TempSrc: Oral Oral Oral Oral  SpO2:      Weight:      Height:        Intake/Output Summary (Last 24 hours) at 10/24/2023 1511 Last data filed at 10/24/2023 1048 Gross per 24 hour  Intake 1020 ml  Output 100 ml  Net 920 ml   Filed Weights   10/21/23  1942  Weight: 87.4 kg    Examination:  Awake Alert, Oriented X 3, No new F.N deficits, Normal affect, jaundiced Symmetrical Chest wall movement, Good air movement bilaterally,  CTAB RRR,No Gallops,Rubs or new Murmurs, No Parasternal Heave +ve B.Sounds, Abd Soft, No tenderness, No rebound - guarding or rigidity. No Cyanosis, Clubbing or edema, No new Rash or bruise        Data Reviewed: I have personally reviewed following labs and imaging studies  CBC: Recent Labs  Lab 10/21/23 1010 10/21/23 1926 10/22/23 0427 10/23/23 0903 10/24/23 0331  WBC 9.7 10.6* 7.3 5.5 5.1  NEUTROABS 6.3  --   --   --   --   HGB 12.9* 12.3* 11.1* 10.8* 9.3*  HCT 34.4* 33.4* 30.3* 30.1* 26.9*  MCV 71.5* 71.5* 72.7* 74.9* 76.2*  PLT 65* 62* 55* 70* 82*    Basic Metabolic Panel: Recent Labs  Lab 10/21/23 1010 10/21/23 1433 10/22/23 0427 10/22/23 1508 10/23/23 0432 10/24/23 0331  NA 125*  --  128*  --  130* 134*  K 3.1*  --  2.8*  --  3.4* 3.4*  CL 79*  --  85*  --  93* 98  CO2 24  --  23  --  26 24  GLUCOSE 103*  --  107*  --  86 94  BUN 5*  --  6  --  5* <5*  CREATININE 0.89 0.78 1.16  --  0.97 0.91  CALCIUM  6.8*  --  6.3*  --  7.2* 7.9*  MG 0.7*  --  1.4* 2.7* 1.9 1.5*  PHOS  --  6.0*  --  5.1*  --  1.8*    GFR: Estimated Creatinine Clearance: 120.3 mL/min (by C-G formula based on SCr of 0.91 mg/dL).  Liver Function Tests: Recent Labs  Lab 10/21/23 1010 10/22/23 0427 10/22/23 1508 10/23/23 0903 10/24/23 0331  AST 486* 437* 380* 331* 232*  ALT 212* 180* 169* 166* 128*  ALKPHOS 184* 181* 178* 205* 190*  BILITOT 5.8* 5.8* 5.5* 6.3* 5.7*  PROT 6.7 6.0* 5.7* 6.3* 5.8*  ALBUMIN 2.3* 2.0* 1.9* 2.0* 2.4*    CBG: Recent Labs  Lab 10/21/23 1033  GLUCAP 109*     Recent Results (from the past 240 hours)  Culture, blood (routine x 2)     Status: Abnormal   Collection Time: 10/21/23  1:30 PM   Specimen: BLOOD RIGHT ARM  Result Value Ref Range Status   Specimen Description BLOOD RIGHT ARM  Final   Special Requests   Final    BOTTLES DRAWN AEROBIC AND ANAEROBIC Blood Culture results may not be optimal due to an inadequate volume of blood received in  culture bottles   Culture  Setup Time   Final    GRAM POSITIVE COCCI IN BOTH AEROBIC AND ANAEROBIC BOTTLES CRITICAL RESULT CALLED TO, READ BACK BY AND VERIFIED WITHNorberta Beans PHARMD, AT 0827 10/22/23 D.VANHOOK GRAM POSITIVE RODS AEROBIC BOTTLE ONLY CRITICAL RESULT CALLED TO, READ BACK BY AND VERIFIED WITH: PHARMD BOBBY JO STONER ON 10/22/23 @ 1336 BY DRT    Culture (A)  Final    COAGULASE NEGATIVE STAPHYLOCOCCUS STAPHYLOCOCCUS EPIDERMIDIS THE SIGNIFICANCE OF ISOLATING THIS ORGANISM FROM A SINGLE SET OF BLOOD CULTURES WHEN MULTIPLE SETS ARE DRAWN IS UNCERTAIN. PLEASE NOTIFY THE MICROBIOLOGY DEPARTMENT WITHIN ONE WEEK IF SPECIATION AND SENSITIVITIES ARE REQUIRED. Performed at [facility], [address] [place], [place] [address]    Report Status 10/24/2023 FINAL  Final  Blood  Culture ID Panel (Reflexed)     Status: Abnormal   Collection Time: 10/21/23  1:30 PM  Result Value Ref Range Status   Enterococcus faecalis NOT DETECTED NOT DETECTED Final   Enterococcus Faecium NOT DETECTED NOT DETECTED Final   Listeria monocytogenes NOT DETECTED NOT DETECTED Final   Staphylococcus species DETECTED (A) NOT DETECTED Final    Comment: CRITICAL RESULT CALLED TO, READ BACK BY AND VERIFIED WITH: BAlease Amend PHARMD, AT 0827 10/22/23 D.VANHOOK    Staphylococcus aureus (BCID) NOT DETECTED NOT DETECTED Final   Staphylococcus epidermidis DETECTED (A) NOT DETECTED Final    Comment: CRITICAL RESULT CALLED TO, READ BACK BY AND VERIFIED WITH: BAlease Amend PHARMD, AT 0827 10/22/23 D.VANHOOK    Staphylococcus lugdunensis NOT DETECTED NOT DETECTED Final   Streptococcus species NOT DETECTED NOT DETECTED Final   Streptococcus agalactiae NOT DETECTED NOT DETECTED Final   Streptococcus pneumoniae NOT DETECTED NOT DETECTED Final   Streptococcus pyogenes NOT DETECTED NOT DETECTED Final   A.calcoaceticus-baumannii NOT DETECTED NOT DETECTED Final   Bacteroides fragilis NOT DETECTED NOT DETECTED Final    Enterobacterales NOT DETECTED NOT DETECTED Final   Enterobacter cloacae complex NOT DETECTED NOT DETECTED Final   Escherichia coli NOT DETECTED NOT DETECTED Final   Klebsiella aerogenes NOT DETECTED NOT DETECTED Final   Klebsiella oxytoca NOT DETECTED NOT DETECTED Final   Klebsiella pneumoniae NOT DETECTED NOT DETECTED Final   Proteus species NOT DETECTED NOT DETECTED Final   Salmonella species NOT DETECTED NOT DETECTED Final   Serratia marcescens NOT DETECTED NOT DETECTED Final   Haemophilus influenzae NOT DETECTED NOT DETECTED Final   Neisseria meningitidis NOT DETECTED NOT DETECTED Final   Pseudomonas aeruginosa NOT DETECTED NOT DETECTED Final   Stenotrophomonas maltophilia NOT DETECTED NOT DETECTED Final   Candida albicans NOT DETECTED NOT DETECTED Final   Candida auris NOT DETECTED NOT DETECTED Final   Candida glabrata NOT DETECTED NOT DETECTED Final   Candida krusei NOT DETECTED NOT DETECTED Final   Candida parapsilosis NOT DETECTED NOT DETECTED Final   Candida tropicalis NOT DETECTED NOT DETECTED Final   Cryptococcus neoformans/gattii NOT DETECTED NOT DETECTED Final   Methicillin resistance mecA/C NOT DETECTED NOT DETECTED Final    Comment: Performed at [facility], [address], [place], [place] [address]  Culture, blood (routine x 2)     Status: None (Preliminary result)   Collection Time: 10/21/23  2:21 PM   Specimen: BLOOD LEFT ARM  Result Value Ref Range Status   Specimen Description BLOOD LEFT ARM  Final   Special Requests   Final    BOTTLES DRAWN AEROBIC AND ANAEROBIC Blood Culture adequate volume   Culture   Final    NO GROWTH 3 DAYS Performed at [facility], [address] [place], [place] [address]    Report Status PENDING  Incomplete  Culture, blood (single) w Reflex to ID Panel     Status: None (Preliminary result)   Collection Time: 10/23/23  9:03 AM   Specimen: BLOOD LEFT ARM  Result Value Ref Range Status   Specimen Description BLOOD LEFT  ARM  Final   Special Requests   Final    BOTTLES DRAWN AEROBIC AND ANAEROBIC Blood Culture adequate volume   Culture   Final    NO GROWTH 1 DAY Performed at [facility], [address], [place], [place] [address]    Report Status PENDING  Incomplete         Radiology Studies: ECHOCARDIOGRAM  COMPLETE Result Date: 10/24/2023    ECHOCARDIOGRAM REPORT   Patient Name:   Harold Flores Date of Exam: 10/24/2023 Medical Rec #:  [id number]          Height:       70.0 in Accession #:    [id number]         Weight:       192.7 lb Date of Birth:  [birth year]           BSA:          2.055 m Patient Age:    33 years           BP:           101/65 mmHg Patient Gender: M                  HR:           90 bpm. Exam Location:  Inpatient Procedure: 2D Echo, Cardiac Doppler and Color Doppler (Both Spectral and Color            Flow Doppler were utilized during procedure). Indications:    I47.2 Ventricular tachycardia  History:        Patient has prior history of Echocardiogram examinations, most                 recent 08/04/2020.  Sonographer:    Andrena Bang Referring Phys: Epifanio Haste IMPRESSIONS  1. Left ventricular ejection fraction, by estimation, is 55 to 60%. The left ventricle has normal function. The left ventricle has no regional wall motion abnormalities. Left ventricular diastolic parameters were normal.  2. Right ventricular systolic function is normal. The right ventricular size is normal. Tricuspid regurgitation signal is inadequate for assessing PA pressure.  3. The mitral valve is normal in structure. Trivial mitral valve regurgitation. No evidence of mitral stenosis.  4. The aortic valve is tricuspid. Aortic valve regurgitation is not visualized. No aortic stenosis is present.  5. The inferior vena cava is normal in size with greater than 50% respiratory variability, suggesting right atrial pressure of 3 mmHg. FINDINGS  Left Ventricle: Left ventricular ejection fraction, by estimation, is 55  to 60%. The left ventricle has normal function. The left ventricle has no regional wall motion abnormalities. The left ventricular internal cavity size was normal in size. There is  no left ventricular hypertrophy. Left ventricular diastolic parameters were normal. Right Ventricle: The right ventricular size is normal. No increase in right ventricular wall thickness. Right ventricular systolic function is normal. Tricuspid regurgitation signal is inadequate for assessing PA pressure. Left Atrium: Left atrial size was normal in size. Right Atrium: Right atrial size was normal in size. Pericardium: There is no evidence of pericardial effusion. Mitral Valve: The mitral valve is normal in structure. Trivial mitral valve regurgitation. No evidence of mitral valve stenosis. Tricuspid Valve: The tricuspid valve is normal in structure. Tricuspid valve regurgitation is not demonstrated. Aortic Valve: The aortic valve is tricuspid. Aortic valve regurgitation is not visualized. No aortic stenosis is present. Aortic valve mean gradient measures 3.0 mmHg. Aortic valve peak gradient measures 5.4 mmHg. Aortic valve area, by VTI measures 3.31 cm. Pulmonic Valve: The pulmonic valve was normal in structure. Pulmonic valve regurgitation is trivial. Aorta: The aortic root is normal in size and structure. Venous: The inferior vena cava is normal in size with greater than 50% respiratory variability, suggesting right atrial pressure of 3 mmHg. IAS/Shunts: No atrial level shunt detected by color flow Doppler.  LEFT VENTRICLE PLAX 2D LVIDd:         4.50 cm      Diastology LVIDs:         3.30 cm      LV e' medial:  9.14 cm/s LV PW:         1.10 cm      LV e' lateral: 15.00 cm/s LV IVS:        0.90 cm LVOT diam:     2.20 cm LV SV:         74 LV SV Index:   36 LVOT Area:     3.80 cm  LV Volumes (MOD) LV vol d, MOD A2C: 113.0 ml LV vol d, MOD A4C: 135.0 ml LV vol s, MOD A2C: 45.5 ml LV vol s, MOD A4C: 47.4 ml LV SV MOD A2C:     67.5 ml LV SV  MOD A4C:     135.0 ml LV SV MOD BP:      76.0 ml RIGHT VENTRICLE RV S prime:     12.60 cm/s TAPSE (M-mode): 1.5 cm LEFT ATRIUM           Index LA Vol (A2C): 39.8 ml 19.37 ml/m LA Vol (A4C): 29.6 ml 14.41 ml/m  AORTIC VALVE AV Area (Vmax):    3.31 cm AV Area (Vmean):   2.99 cm AV Area (VTI):     3.31 cm AV Vmax:           116.00 cm/s AV Vmean:          77.400 cm/s AV VTI:            0.223 m AV Peak Grad:      5.4 mmHg AV Mean Grad:      3.0 mmHg LVOT Vmax:         101.00 cm/s LVOT Vmean:        60.900 cm/s LVOT VTI:          0.194 m LVOT/AV VTI ratio: 0.87  AORTA Ao Asc diam: 3.20 cm  SHUNTS Systemic VTI:  0.19 m Systemic Diam: 2.20 cm Dalton McleanMD Electronically signed by Archer Bear Signature Date/Time: 10/24/2023/12:24:05 PM    Final          Scheduled Meds:  bictegravir-emtricitabine -tenofovir  AF  1 tablet Oral Daily   carvedilol   6.25 mg Oral BID WC   chlordiazePOXIDE  10 mg Oral QID   doxycycline   100 mg Oral BID WC   famotidine   20 mg Oral BID   folic acid   1 mg Oral Daily   lactulose  20 g Oral BID   magic mouthwash  5 mL Oral QID   multivitamin with minerals  1 tablet Oral Q lunch   nystatin  5 mL Oral QID   pantoprazole  (PROTONIX ) IV  40 mg Intravenous Q12H   phosphorus  500 mg Oral QID   sulfamethoxazole -trimethoprim   1 tablet Oral Daily   thiamine   100 mg Oral Daily   Or   thiamine   100 mg Intravenous Daily   Continuous Infusions:     LOS: 3 days       Seena Dadds, MD [facility]   To contact the attending provider between 7A-7P or the covering provider during after hours 7P-7A, please log into the web site [email or web address] and access using universal [place] password for that web site. If you do not have the password, please call the hospital operator.  10/24/2023, 3:11 PM

## 2023-10-25 DIAGNOSIS — F10930 Alcohol use, unspecified with withdrawal, uncomplicated: Secondary | ICD-10-CM | POA: Diagnosis not present

## 2023-10-25 LAB — COMPREHENSIVE METABOLIC PANEL WITH GFR
ALT: 113 U/L — ABNORMAL HIGH (ref 0–44)
AST: 197 U/L — ABNORMAL HIGH (ref 15–41)
Albumin: 2.8 g/dL — ABNORMAL LOW (ref 3.5–5.0)
Alkaline Phosphatase: 210 U/L — ABNORMAL HIGH (ref 38–126)
Anion gap: 13 (ref 5–15)
BUN: <5 mg/dL — ABNORMAL LOW (ref 6–20)
CO2: 23 mmol/L (ref 22–32)
Calcium: 8.1 mg/dL — ABNORMAL LOW (ref 8.9–10.3)
Chloride: 102 mmol/L (ref 98–111)
Creatinine, Ser: 0.93 mg/dL (ref 0.61–1.24)
GFR, Estimated: >60 mL/min (ref >60)
Glucose, Bld: 112 mg/dL — ABNORMAL HIGH (ref 70–99)
Potassium: 3.1 mmol/L — ABNORMAL LOW (ref 3.5–5.1)
Sodium: 138 mmol/L (ref 135–145)
Total Bilirubin: 4.9 mg/dL — ABNORMAL HIGH (ref 0.0–1.2)
Total Protein: 5.9 g/dL — ABNORMAL LOW (ref 6.5–8.1)

## 2023-10-25 LAB — CBC
HCT: 27.5 % — ABNORMAL LOW (ref 39.0–52.0)
Hemoglobin: 9.6 g/dL — ABNORMAL LOW (ref 13.0–17.0)
MCH: 27.1 pg (ref 26.0–34.0)
MCHC: 34.9 g/dL (ref 30.0–36.0)
MCV: 77.7 fL — ABNORMAL LOW (ref 80.0–100.0)
Platelets: 129 10*3/uL — ABNORMAL LOW (ref 150–400)
RBC: 3.54 MIL/uL — ABNORMAL LOW (ref 4.22–5.81)
RDW: 15.9 % — ABNORMAL HIGH (ref 11.5–15.5)
WBC: 5.7 10*3/uL (ref 4.0–10.5)
nRBC: 0.5 % — ABNORMAL HIGH (ref 0.0–0.2)

## 2023-10-25 LAB — FOLATE: Folate: 6 ng/mL (ref >5.9)

## 2023-10-25 LAB — PHOSPHORUS: Phosphorus: 1.7 mg/dL — ABNORMAL LOW (ref 2.5–4.6)

## 2023-10-25 LAB — VITAMIN B12: Vitamin B-12: 2382 pg/mL — ABNORMAL HIGH (ref 180–914)

## 2023-10-25 LAB — MAGNESIUM: Magnesium: 1.4 mg/dL — ABNORMAL LOW (ref 1.7–2.4)

## 2023-10-25 MED ORDER — POTASSIUM CHLORIDE CRYS ER 20 MEQ PO TBCR
40.0000 meq | EXTENDED_RELEASE_TABLET | Freq: Four times a day (QID) | ORAL | Status: AC
  Administered 2023-10-25 (×2): 40 meq via ORAL
  Filled 2023-10-25 (×2): qty 2

## 2023-10-25 MED ORDER — MAGNESIUM SULFATE 4 GM/100ML IV SOLN
4.0000 g | Freq: Once | INTRAVENOUS | Status: AC
  Administered 2023-10-25: 4 g via INTRAVENOUS
  Filled 2023-10-25: qty 100

## 2023-10-25 MED ORDER — K PHOS MONO-SOD PHOS DI & MONO 155-852-130 MG PO TABS
500.0000 mg | ORAL_TABLET | Freq: Four times a day (QID) | ORAL | Status: AC
  Administered 2023-10-25 (×4): 500 mg via ORAL
  Filled 2023-10-25 (×3): qty 2

## 2023-10-25 MED ORDER — SODIUM PHOSPHATES 45 MMOLE/15ML IV SOLN
15.0000 mmol | Freq: Once | INTRAVENOUS | Status: AC
  Administered 2023-10-25: 15 mmol via INTRAVENOUS
  Filled 2023-10-25: qty 5

## 2023-10-25 MED ORDER — LACTULOSE 10 GM/15ML PO SOLN
10.0000 g | Freq: Two times a day (BID) | ORAL | Status: DC
  Administered 2023-10-25 – 2023-10-27 (×4): 10 g via ORAL
  Filled 2023-10-25 (×4): qty 30

## 2023-10-25 NOTE — Progress Notes (Signed)
 PROGRESS NOTE    Harold Flores  WJX:[id number] DOB: 1991/05/10 DOA: 10/21/2023 PCP: Roslyn Coombe, MD    Chief Complaint  Patient presents with   Emesis    Brief Narrative:   Patient is a 33 y.o. male with history of HIV, HTN, alcohol  use-presented with fatigue/weakness nausea, vomiting, secondary to alcohol  abuse and withdrawals.  Assessment & Plan:   Principal Problem:   Alcohol  withdrawal (HCC) Active Problems:   Colitis   Nausea vomiting and diarrhea   Hyperbilirubinemia   Alcoholic hepatitis without ascites   Pressure injury of skin  Intractable nausea, vomiting and diarrhea Failure to thrive  -Likely due to alcohol  abuse, and early withdrawals. - much improved, advanced to regular diet. but he did develop again some nausea and vomiting in the afternoon - on IV Protonix  40 mg twice daily. - Monitor closely for refeeding syndrome - Lipase within normal limit  Alcohol  dependence with withdrawals -Continue with CIWA protocol, thiamine  and folic acid  -scheduled Librium , will start to taper  Hyponatremia -Hypovolemic, improving with IV fluids.  Severe hypokalemia Hypophosphatemia Hypomagnesemia - Remains significantly low,  despite aggressive replacement.  (he presented with hyperphosphatemia currently hypophosphatemia, continue to monitor closely for refeeding syndrome), will replace today, recheck in am. - Monitor phosphorus closely as high risk for refeeding syndrome  Hypocalcemia -Replaced, monitor closely  Transaminitis Hyperilirubinemia Thrombocytopenia Hyperammonemia - This is all due to heavy alcohol  abuse, continue with supportive therapy -LFTs trending down  - Negative hepatitis panel - Multiple BMs yesterday, will decrease lactulose  level   HIV/AIDS Management per ID Does appear he is with poor compliance,  resumed on Biktarvy  HIV quantitative 61,000 copies, CD40 170 Noted on Bactrim  for prophylaxis  Oral thrush -Continue with Magic  mouthwash  2/4 blood cultures growing gram-positive cocci 1/4 gram-positive rods -Unclear significance, culture growing Staphylococcus Dermidex, most likely contaminant,  Proctitis - Evidence on imaging, initially on IV Rocephin  and Flagyl , currently on doxycycline .  Follow-up on STI including chlamydia and gonorrhea  Bilateral upper extremity erythema - Patient reports status post peeling at the salon recently, does not appear to be infected, will continue to monitor - This has resolved  Abnormal finding in pancreas on imaging - Further workup can be pursued as an outpatient  NSVT - Noted overnight NSVT, symptomatic, correct electrolyte derangement, continue with beta-blockers, 2D echo with a preserved EF  Macrocytic anemia -Likely due to alcohol  abuse - Check B12 and folic acid   DVT prophylaxis: SCD given thrombocytopenia, encouraged to ambulate. Code Status: Full Family Communication: none at ebdside Disposition:   Status is: Inpatient    Consultants:  ID   Subjective: No nausea, no vomiting, multiple BMs yesterday, will decrease lactulose  Objective: Vitals:   10/25/23 0030 10/25/23 0540 10/25/23 0838 10/25/23 1100  BP: (!) 96/53  (!) 125/95 110/70  Pulse: (!) 101 (!) 104  97  Resp: 20  16 (!) 26  Temp: 98.8 F (37.1 C) 99.1 F (37.3 C)  97.8 F (36.6 C)  TempSrc: Oral Oral  Oral  SpO2: 100%   100%  Weight:      Height:        Intake/Output Summary (Last 24 hours) at 10/25/2023 1338 Last data filed at 10/24/2023 1613 Gross per 24 hour  Intake 480 ml  Output --  Net 480 ml   Filed Weights   10/21/23 1942  Weight: 87.4 kg    Examination:  Awake Alert, Oriented X 3, jaundiced Symmetrical Chest wall movement, Good air movement bilaterally, CTAB RRR,No  Gallops,Rubs or new Murmurs, No Parasternal Heave +ve B.Sounds, Abd Soft, No tenderness, No rebound - guarding or rigidity. No Cyanosis, Clubbing or edema, No new Rash or bruise        Data  Reviewed: I have personally reviewed following labs and imaging studies  CBC: Recent Labs  Lab 10/21/23 1010 10/21/23 1926 10/22/23 0427 10/23/23 0903 10/24/23 0331 10/25/23 0320  WBC 9.7 10.6* 7.3 5.5 5.1 5.7  NEUTROABS 6.3  --   --   --   --   --   HGB 12.9* 12.3* 11.1* 10.8* 9.3* 9.6*  HCT 34.4* 33.4* 30.3* 30.1* 26.9* 27.5*  MCV 71.5* 71.5* 72.7* 74.9* 76.2* 77.7*  PLT 65* 62* 55* 70* 82* 129*    Basic Metabolic Panel: Recent Labs  Lab 10/21/23 1010 10/21/23 1433 10/22/23 0427 10/22/23 1508 10/23/23 0432 10/24/23 0331 10/25/23 0320  NA 125*  --  128*  --  130* 134* 138  K 3.1*  --  2.8*  --  3.4* 3.4* 3.1*  CL 79*  --  85*  --  93* 98 102  CO2 24  --  23  --  26 24 23   GLUCOSE 103*  --  107*  --  86 94 112*  BUN 5*  --  6  --  5* <5* <5*  CREATININE 0.89 0.78 1.16  --  0.97 0.91 0.93  CALCIUM  6.8*  --  6.3*  --  7.2* 7.9* 8.1*  MG 0.7*  --  1.4* 2.7* 1.9 1.5* 1.4*  PHOS  --  6.0*  --  5.1*  --  1.8* 1.7*    GFR: Estimated Creatinine Clearance: 117.7 mL/min (by C-G formula based on SCr of 0.93 mg/dL).  Liver Function Tests: Recent Labs  Lab 10/22/23 0427 10/22/23 1508 10/23/23 0903 10/24/23 0331 10/25/23 0320  AST 437* 380* 331* 232* 197*  ALT 180* 169* 166* 128* 113*  ALKPHOS 181* 178* 205* 190* 210*  BILITOT 5.8* 5.5* 6.3* 5.7* 4.9*  PROT 6.0* 5.7* 6.3* 5.8* 5.9*  ALBUMIN  2.0* 1.9* 2.0* 2.4* 2.8*    CBG: Recent Labs  Lab 10/21/23 1033  GLUCAP 109*     Recent Results (from the past 240 hours)  Culture, blood (routine x 2)     Status: Abnormal   Collection Time: 10/21/23  1:30 PM   Specimen: BLOOD RIGHT ARM  Result Value Ref Range Status   Specimen Description BLOOD RIGHT ARM  Final   Special Requests   Final    BOTTLES DRAWN AEROBIC AND ANAEROBIC Blood Culture results may not be optimal due to an inadequate volume of blood received in culture bottles   Culture  Setup Time   Final    GRAM POSITIVE COCCI IN BOTH AEROBIC AND ANAEROBIC  BOTTLES CRITICAL RESULT CALLED TO, READ BACK BY AND VERIFIED WITHNorberta Beans PHARMD, AT 0827 10/22/23 D.VANHOOK GRAM POSITIVE RODS AEROBIC BOTTLE ONLY CRITICAL RESULT CALLED TO, READ BACK BY AND VERIFIED WITH: PHARMD BOBBY JO STONER ON 10/22/23 @ 1336 BY DRT    Culture (A)  Final    COAGULASE NEGATIVE STAPHYLOCOCCUS STAPHYLOCOCCUS EPIDERMIDIS THE SIGNIFICANCE OF ISOLATING THIS ORGANISM FROM A SINGLE SET OF BLOOD CULTURES WHEN MULTIPLE SETS ARE DRAWN IS UNCERTAIN. PLEASE NOTIFY THE MICROBIOLOGY DEPARTMENT WITHIN ONE WEEK IF SPECIATION AND SENSITIVITIES ARE REQUIRED. Performed at [facility], [address], [place], [place] [address]    Report Status 10/24/2023 FINAL  Final  Blood Culture ID Panel (Reflexed)     Status: Abnormal  Collection Time: 10/21/23  1:30 PM  Result Value Ref Range Status   Enterococcus faecalis NOT DETECTED NOT DETECTED Final   Enterococcus Faecium NOT DETECTED NOT DETECTED Final   Listeria monocytogenes NOT DETECTED NOT DETECTED Final   Staphylococcus species DETECTED (A) NOT DETECTED Final    Comment: CRITICAL RESULT CALLED TO, READ BACK BY AND VERIFIED WITH: BAlease Amend PHARMD, AT 0827 10/22/23 D.VANHOOK    Staphylococcus aureus (BCID) NOT DETECTED NOT DETECTED Final   Staphylococcus epidermidis DETECTED (A) NOT DETECTED Final    Comment: CRITICAL RESULT CALLED TO, READ BACK BY AND VERIFIED WITH: BAlease Amend PHARMD, AT 0827 10/22/23 D.VANHOOK    Staphylococcus lugdunensis NOT DETECTED NOT DETECTED Final   Streptococcus species NOT DETECTED NOT DETECTED Final   Streptococcus agalactiae NOT DETECTED NOT DETECTED Final   Streptococcus pneumoniae NOT DETECTED NOT DETECTED Final   Streptococcus pyogenes NOT DETECTED NOT DETECTED Final   A.calcoaceticus-baumannii NOT DETECTED NOT DETECTED Final   Bacteroides fragilis NOT DETECTED NOT DETECTED Final   Enterobacterales NOT DETECTED NOT DETECTED Final   Enterobacter cloacae complex NOT DETECTED NOT DETECTED Final    Escherichia coli NOT DETECTED NOT DETECTED Final   Klebsiella aerogenes NOT DETECTED NOT DETECTED Final   Klebsiella oxytoca NOT DETECTED NOT DETECTED Final   Klebsiella pneumoniae NOT DETECTED NOT DETECTED Final   Proteus species NOT DETECTED NOT DETECTED Final   Salmonella species NOT DETECTED NOT DETECTED Final   Serratia marcescens NOT DETECTED NOT DETECTED Final   Haemophilus influenzae NOT DETECTED NOT DETECTED Final   Neisseria meningitidis NOT DETECTED NOT DETECTED Final   Pseudomonas aeruginosa NOT DETECTED NOT DETECTED Final   Stenotrophomonas maltophilia NOT DETECTED NOT DETECTED Final   Candida albicans NOT DETECTED NOT DETECTED Final   Candida auris NOT DETECTED NOT DETECTED Final   Candida glabrata NOT DETECTED NOT DETECTED Final   Candida krusei NOT DETECTED NOT DETECTED Final   Candida parapsilosis NOT DETECTED NOT DETECTED Final   Candida tropicalis NOT DETECTED NOT DETECTED Final   Cryptococcus neoformans/gattii NOT DETECTED NOT DETECTED Final   Methicillin resistance mecA/C NOT DETECTED NOT DETECTED Final    Comment: Performed at [facility], [address] [place], [place] [address]  Culture, blood (routine x 2)     Status: None (Preliminary result)   Collection Time: 10/21/23  2:21 PM   Specimen: BLOOD LEFT ARM  Result Value Ref Range Status   Specimen Description BLOOD LEFT ARM  Final   Special Requests   Final    BOTTLES DRAWN AEROBIC AND ANAEROBIC Blood Culture adequate volume   Culture   Final    NO GROWTH 4 DAYS Performed at [facility], [address], [place], [place] [address]    Report Status PENDING  Incomplete  Culture, blood (single) w Reflex to ID Panel     Status: None (Preliminary result)   Collection Time: 10/23/23  9:03 AM   Specimen: BLOOD LEFT ARM  Result Value Ref Range Status   Specimen Description BLOOD LEFT ARM  Final   Special Requests   Final    BOTTLES DRAWN AEROBIC AND ANAEROBIC Blood Culture adequate volume    Culture   Final    NO GROWTH 2 DAYS Performed at [facility], [address] [place], [place] [address]    Report Status PENDING  Incomplete         Radiology Studies: ECHOCARDIOGRAM COMPLETE Result Date: 10/24/2023    ECHOCARDIOGRAM REPORT   Patient  Name:   Harold Flores Date of Exam: 10/24/2023 Medical Rec #:  [id number]          Height:       70.0 in Accession #:    [id number]         Weight:       192.7 lb Date of Birth:  12-02-1990           BSA:          2.055 m Patient Age:    33 years           BP:           101/65 mmHg Patient Gender: M                  HR:           90 bpm. Exam Location:  Inpatient Procedure: 2D Echo, Cardiac Doppler and Color Doppler (Both Spectral and Color            Flow Doppler were utilized during procedure). Indications:    I47.2 Ventricular tachycardia  History:        Patient has prior history of Echocardiogram examinations, most                 recent 08/04/2020.  Sonographer:    Andrena Bang Referring Phys: Epifanio Haste IMPRESSIONS  1. Left ventricular ejection fraction, by estimation, is 55 to 60%. The left ventricle has normal function. The left ventricle has no regional wall motion abnormalities. Left ventricular diastolic parameters were normal.  2. Right ventricular systolic function is normal. The right ventricular size is normal. Tricuspid regurgitation signal is inadequate for assessing PA pressure.  3. The mitral valve is normal in structure. Trivial mitral valve regurgitation. No evidence of mitral stenosis.  4. The aortic valve is tricuspid. Aortic valve regurgitation is not visualized. No aortic stenosis is present.  5. The inferior vena cava is normal in size with greater than 50% respiratory variability, suggesting right atrial pressure of 3 mmHg. FINDINGS  Left Ventricle: Left ventricular ejection fraction, by estimation, is 55 to 60%. The left ventricle has normal function. The left ventricle has no regional wall motion abnormalities.  The left ventricular internal cavity size was normal in size. There is  no left ventricular hypertrophy. Left ventricular diastolic parameters were normal. Right Ventricle: The right ventricular size is normal. No increase in right ventricular wall thickness. Right ventricular systolic function is normal. Tricuspid regurgitation signal is inadequate for assessing PA pressure. Left Atrium: Left atrial size was normal in size. Right Atrium: Right atrial size was normal in size. Pericardium: There is no evidence of pericardial effusion. Mitral Valve: The mitral valve is normal in structure. Trivial mitral valve regurgitation. No evidence of mitral valve stenosis. Tricuspid Valve: The tricuspid valve is normal in structure. Tricuspid valve regurgitation is not demonstrated. Aortic Valve: The aortic valve is tricuspid. Aortic valve regurgitation is not visualized. No aortic stenosis is present. Aortic valve mean gradient measures 3.0 mmHg. Aortic valve peak gradient measures 5.4 mmHg. Aortic valve area, by VTI measures 3.31 cm. Pulmonic Valve: The pulmonic valve was normal in structure. Pulmonic valve regurgitation is trivial. Aorta: The aortic root is normal in size and structure. Venous: The inferior vena cava is normal in size with greater than 50% respiratory variability, suggesting right atrial pressure of 3 mmHg. IAS/Shunts: No atrial level shunt detected by color flow Doppler.  LEFT VENTRICLE PLAX 2D LVIDd:  4.50 cm      Diastology LVIDs:         3.30 cm      LV e' medial:  9.14 cm/s LV PW:         1.10 cm      LV e' lateral: 15.00 cm/s LV IVS:        0.90 cm LVOT diam:     2.20 cm LV SV:         74 LV SV Index:   36 LVOT Area:     3.80 cm  LV Volumes (MOD) LV vol d, MOD A2C: 113.0 ml LV vol d, MOD A4C: 135.0 ml LV vol s, MOD A2C: 45.5 ml LV vol s, MOD A4C: 47.4 ml LV SV MOD A2C:     67.5 ml LV SV MOD A4C:     135.0 ml LV SV MOD BP:      76.0 ml RIGHT VENTRICLE RV S prime:     12.60 cm/s TAPSE (M-mode):  1.5 cm LEFT ATRIUM           Index LA Vol (A2C): 39.8 ml 19.37 ml/m LA Vol (A4C): 29.6 ml 14.41 ml/m  AORTIC VALVE AV Area (Vmax):    3.31 cm AV Area (Vmean):   2.99 cm AV Area (VTI):     3.31 cm AV Vmax:           116.00 cm/s AV Vmean:          77.400 cm/s AV VTI:            0.223 m AV Peak Grad:      5.4 mmHg AV Mean Grad:      3.0 mmHg LVOT Vmax:         101.00 cm/s LVOT Vmean:        60.900 cm/s LVOT VTI:          0.194 m LVOT/AV VTI ratio: 0.87  AORTA Ao Asc diam: 3.20 cm  SHUNTS Systemic VTI:  0.19 m Systemic Diam: 2.20 cm Harold McleanMD Electronically signed by Archer Bear Signature Date/Time: 10/24/2023/12:24:05 PM    Final          Scheduled Meds:  bictegravir-emtricitabine -tenofovir  AF  1 tablet Oral Daily   carvedilol   6.25 mg Oral BID WC   chlordiazePOXIDE   10 mg Oral QID   doxycycline   100 mg Oral BID WC   famotidine   20 mg Oral BID   folic acid   1 mg Oral Daily   lactulose   20 g Oral BID   magic mouthwash  5 mL Oral QID   multivitamin with minerals  1 tablet Oral Q lunch   nystatin   5 mL Oral QID   pantoprazole  (PROTONIX ) IV  40 mg Intravenous Q12H   phosphorus  500 mg Oral QID   potassium chloride   40 mEq Oral Q6H   sulfamethoxazole -trimethoprim   1 tablet Oral Daily   thiamine   100 mg Oral Daily   Or   thiamine   100 mg Intravenous Daily   Continuous Infusions:  sodium PHOSPHATE  IVPB (in mmol) 15 mmol (10/25/23 0823)      LOS: 4 days       Harold Dadds, MD [facility]   To contact the attending provider between 7A-7P or the covering provider during after hours 7P-7A, please log into the web site [email or web address] and access using universal [place] password for that web site. If you do not have the password, please call the hospital operator.  10/25/2023, 1:38 PM

## 2023-10-26 DIAGNOSIS — F10930 Alcohol use, unspecified with withdrawal, uncomplicated: Secondary | ICD-10-CM | POA: Diagnosis not present

## 2023-10-26 LAB — CBC
HCT: 30.2 % — ABNORMAL LOW (ref 39.0–52.0)
Hemoglobin: 10.1 g/dL — ABNORMAL LOW (ref 13.0–17.0)
MCH: 26.6 pg (ref 26.0–34.0)
MCHC: 33.4 g/dL (ref 30.0–36.0)
MCV: 79.5 fL — ABNORMAL LOW (ref 80.0–100.0)
Platelets: 172 10*3/uL (ref 150–400)
RBC: 3.8 MIL/uL — ABNORMAL LOW (ref 4.22–5.81)
RDW: 17.3 % — ABNORMAL HIGH (ref 11.5–15.5)
WBC: 7.1 10*3/uL (ref 4.0–10.5)
nRBC: 2 % — ABNORMAL HIGH (ref 0.0–0.2)

## 2023-10-26 LAB — PHOSPHORUS: Phosphorus: 4.1 mg/dL (ref 2.5–4.6)

## 2023-10-26 LAB — COMPREHENSIVE METABOLIC PANEL WITH GFR
ALT: 112 U/L — ABNORMAL HIGH (ref 0–44)
AST: 177 U/L — ABNORMAL HIGH (ref 15–41)
Albumin: 2.7 g/dL — ABNORMAL LOW (ref 3.5–5.0)
Alkaline Phosphatase: 259 U/L — ABNORMAL HIGH (ref 38–126)
Anion gap: 12 (ref 5–15)
BUN: <5 mg/dL — ABNORMAL LOW (ref 6–20)
CO2: 23 mmol/L (ref 22–32)
Calcium: 8.1 mg/dL — ABNORMAL LOW (ref 8.9–10.3)
Chloride: 104 mmol/L (ref 98–111)
Creatinine, Ser: 0.88 mg/dL (ref 0.61–1.24)
GFR, Estimated: >60 mL/min (ref >60)
Glucose, Bld: 114 mg/dL — ABNORMAL HIGH (ref 70–99)
Potassium: 3.5 mmol/L (ref 3.5–5.1)
Sodium: 139 mmol/L (ref 135–145)
Total Bilirubin: 5 mg/dL — ABNORMAL HIGH (ref 0.0–1.2)
Total Protein: 6.3 g/dL — ABNORMAL LOW (ref 6.5–8.1)

## 2023-10-26 LAB — CULTURE, BLOOD (ROUTINE X 2)
Culture: NO GROWTH
Special Requests: ADEQUATE

## 2023-10-26 LAB — MAGNESIUM: Magnesium: 1.5 mg/dL — ABNORMAL LOW (ref 1.7–2.4)

## 2023-10-26 MED ORDER — POTASSIUM CHLORIDE CRYS ER 20 MEQ PO TBCR
40.0000 meq | EXTENDED_RELEASE_TABLET | Freq: Once | ORAL | Status: AC
  Administered 2023-10-26: 40 meq via ORAL
  Filled 2023-10-26: qty 2

## 2023-10-26 MED ORDER — POTASSIUM CHLORIDE CRYS ER 20 MEQ PO TBCR: 40.0000 meq | EXTENDED_RELEASE_TABLET | Freq: Once | ORAL | Status: DC

## 2023-10-26 MED ORDER — CHLORDIAZEPOXIDE HCL 5 MG PO CAPS
5.0000 mg | ORAL_CAPSULE | Freq: Three times a day (TID) | ORAL | Status: DC
  Administered 2023-10-26 – 2023-10-27 (×2): 5 mg via ORAL
  Filled 2023-10-26 (×4): qty 1

## 2023-10-26 MED ORDER — MAGNESIUM SULFATE 4 GM/100ML IV SOLN
4.0000 g | Freq: Once | INTRAVENOUS | Status: AC
  Administered 2023-10-26: 4 g via INTRAVENOUS
  Filled 2023-10-26: qty 100

## 2023-10-26 MED ORDER — MAGNESIUM SULFATE IN D5W 1-5 GM/100ML-% IV SOLN
1.0000 g | Freq: Once | INTRAVENOUS | Status: AC
  Administered 2023-10-26: 1 g via INTRAVENOUS
  Filled 2023-10-26: qty 100

## 2023-10-26 MED ORDER — PANTOPRAZOLE SODIUM 40 MG PO TBEC
40.0000 mg | DELAYED_RELEASE_TABLET | Freq: Every day | ORAL | Status: DC
  Administered 2023-10-27: 40 mg via ORAL
  Filled 2023-10-26: qty 1

## 2023-10-26 MED ORDER — POTASSIUM CHLORIDE CRYS ER 20 MEQ PO TBCR
20.0000 meq | EXTENDED_RELEASE_TABLET | Freq: Once | ORAL | Status: AC
  Administered 2023-10-26: 20 meq via ORAL
  Filled 2023-10-26: qty 1

## 2023-10-26 NOTE — Plan of Care (Signed)
  Problem: Education: Goal: Knowledge of General Education information will improve Description: Including pain rating scale, medication(s)/side effects and non-pharmacologic comfort measures Outcome: Progressing   Problem: Clinical Measurements: Goal: Will remain free from infection Outcome: Progressing Goal: Diagnostic test results will improve Outcome: Progressing Goal: Cardiovascular complication will be avoided Outcome: Progressing   Problem: Nutrition: Goal: Adequate nutrition will be maintained Outcome: Progressing   Problem: Coping: Goal: Level of anxiety will decrease Outcome: Progressing   Problem: Safety: Goal: Ability to remain free from injury will improve Outcome: Progressing

## 2023-10-26 NOTE — Plan of Care (Signed)

## 2023-10-26 NOTE — Progress Notes (Signed)
 PROGRESS NOTE    Harold Flores  VWU:[id number] DOB: 03/20/1991 DOA: 10/21/2023 PCP: Roslyn Coombe, MD    Chief Complaint  Patient presents with   Emesis    Brief Narrative:   Patient is a 33 y.o. male with history of HIV, HTN, alcohol  use-presented with fatigue/weakness nausea, vomiting, secondary to alcohol  abuse and withdrawals.  Assessment & Plan:   Principal Problem:   Alcohol  withdrawal (HCC) Active Problems:   Colitis   Nausea vomiting and diarrhea   Hyperbilirubinemia   Alcoholic hepatitis without ascites   Pressure injury of skin  Intractable nausea, vomiting and diarrhea Failure to thrive  -Likely due to alcohol  abuse, and early withdrawals. - much improved, advanced to regular diet. but he did develop again some nausea and vomiting in the afternoon - on IV Protonix  40 mg twice daily.  Will switch to p.o. - Monitor closely for refeeding syndrome - Lipase within normal limit  Alcohol  dependence with withdrawals -Continue with CIWA protocol, thiamine  and folic acid  -scheduled Librium , will start to taper,   Hyponatremia -Hypovolemic, improving with IV fluids.  Severe hypokalemia Hypophosphatemia Hypomagnesemia - Remains significantly low,  despite aggressive replacement.  (he presented with hyperphosphatemia currently hypophosphatemia, continue to monitor closely for refeeding syndrome), will replace today, recheck in am. - Monitor phosphorus closely as high risk for refeeding syndrome - Magnesium  significantly low today, will give supplementation, as well as supplement potassium of 3.5.  Hypocalcemia -Replaced, monitor closely  Transaminitis Hyperilirubinemia Thrombocytopenia Hyperammonemia - This is all due to heavy alcohol  abuse, continue with supportive therapy -LFTs trending down  - Negative hepatitis panel - Crease lactulose  level given multiple BMs   HIV/AIDS Management per ID Does appear he is with poor compliance,  resumed on  Biktarvy  HIV quantitative 61,000 copies, CD40 170 Noted on Bactrim  for prophylaxis  Oral thrush -Continue with Magic mouthwash  2/4 blood cultures growing gram-positive cocci 1/4 gram-positive rods -Unclear significance, culture growing Staphylococcus Dermidex, most likely contaminant,  Proctitis - Evidence on imaging, initially on IV Rocephin  and Flagyl , currently on doxycycline .  Follow-up on STI including chlamydia and gonorrhea  Bilateral upper extremity erythema - Patient reports status post peeling at the salon recently, does not appear to be infected, will continue to monitor - This has resolved  Abnormal finding in pancreas on imaging - Further workup can be pursued as an outpatient  NSVT - Noted overnight NSVT, symptomatic, correct electrolyte derangement, continue with beta-blockers, 2D echo with a preserved EF  Macrocytic anemia -Likely due to alcohol  abuse - Check B12 and folic acid   DVT prophylaxis: SCD given thrombocytopenia, encouraged to ambulate. Code Status: Full Family Communication: none at ebdside Disposition:   Status is: Inpatient    Consultants:  ID   Subjective: No nausea, no vomiting, diarrhea has improved Objective: Vitals:   10/25/23 2326 10/26/23 0348 10/26/23 0743 10/26/23 1218  BP: (!) 134/96 109/69 115/85 110/79  Pulse:   (!) 101   Resp: 16 19 20 18   Temp: 98.2 F (36.8 C) 98.7 F (37.1 C) 98.5 F (36.9 C) 98.5 F (36.9 C)  TempSrc: Oral Oral Oral Oral  SpO2:   98% 98%  Weight:      Height:        Intake/Output Summary (Last 24 hours) at 10/26/2023 1418 Last data filed at 10/25/2023 1638 Gross per 24 hour  Intake 158.96 ml  Output --  Net 158.96 ml   Filed Weights   10/21/23 1942  Weight: 87.4 kg    Examination:  Awake Alert, Oriented X 3, No new F.N deficits, Normal affect RRR,No Gallops,Rubs or new Murmurs, No Parasternal Heave +ve B.Sounds, Abd Soft, No tenderness.        Data Reviewed: I have  personally reviewed following labs and imaging studies  CBC: Recent Labs  Lab 10/21/23 1010 10/21/23 1926 10/22/23 0427 10/23/23 0903 10/24/23 0331 10/25/23 0320 10/26/23 0736  WBC 9.7   < > 7.3 5.5 5.1 5.7 7.1  NEUTROABS 6.3  --   --   --   --   --   --   HGB 12.9*   < > 11.1* 10.8* 9.3* 9.6* 10.1*  HCT 34.4*   < > 30.3* 30.1* 26.9* 27.5* 30.2*  MCV 71.5*   < > 72.7* 74.9* 76.2* 77.7* 79.5*  PLT 65*   < > 55* 70* 82* 129* 172   < > = values in this interval not displayed.    Basic Metabolic Panel: Recent Labs  Lab 10/21/23 1433 10/22/23 0427 10/22/23 1508 10/23/23 0432 10/24/23 0331 10/25/23 0320 10/26/23 0736  NA  --  128*  --  130* 134* 138 139  K  --  2.8*  --  3.4* 3.4* 3.1* 3.5  CL  --  85*  --  93* 98 102 104  CO2  --  23  --  26 24 23 23   GLUCOSE  --  107*  --  86 94 112* 114*  BUN  --  6  --  5* <5* <5* <5*  CREATININE 0.78 1.16  --  0.97 0.91 0.93 0.88  CALCIUM   --  6.3*  --  7.2* 7.9* 8.1* 8.1*  MG  --  1.4* 2.7* 1.9 1.5* 1.4* 1.5*  PHOS 6.0*  --  5.1*  --  1.8* 1.7* 4.1    GFR: Estimated Creatinine Clearance: 124.4 mL/min (by C-G formula based on SCr of 0.88 mg/dL).  Liver Function Tests: Recent Labs  Lab 10/22/23 1508 10/23/23 0903 10/24/23 0331 10/25/23 0320 10/26/23 0736  AST 380* 331* 232* 197* 177*  ALT 169* 166* 128* 113* 112*  ALKPHOS 178* 205* 190* 210* 259*  BILITOT 5.5* 6.3* 5.7* 4.9* 5.0*  PROT 5.7* 6.3* 5.8* 5.9* 6.3*  ALBUMIN  1.9* 2.0* 2.4* 2.8* 2.7*    CBG: Recent Labs  Lab 10/21/23 1033  GLUCAP 109*     Recent Results (from the past 240 hours)  Culture, blood (routine x 2)     Status: Abnormal   Collection Time: 10/21/23  1:30 PM   Specimen: BLOOD RIGHT ARM  Result Value Ref Range Status   Specimen Description BLOOD RIGHT ARM  Final   Special Requests   Final    BOTTLES DRAWN AEROBIC AND ANAEROBIC Blood Culture results may not be optimal due to an inadequate volume of blood received in culture bottles   Culture   Setup Time   Final    GRAM POSITIVE COCCI IN BOTH AEROBIC AND ANAEROBIC BOTTLES CRITICAL RESULT CALLED TO, READ BACK BY AND VERIFIED WITHNorberta Beans PHARMD, AT 0827 10/22/23 D.VANHOOK GRAM POSITIVE RODS AEROBIC BOTTLE ONLY CRITICAL RESULT CALLED TO, READ BACK BY AND VERIFIED WITH: PHARMD BOBBY JO STONER ON 10/22/23 @ 1336 BY DRT    Culture (A)  Final    COAGULASE NEGATIVE STAPHYLOCOCCUS STAPHYLOCOCCUS EPIDERMIDIS THE SIGNIFICANCE OF ISOLATING THIS ORGANISM FROM A SINGLE SET OF BLOOD CULTURES WHEN MULTIPLE SETS ARE DRAWN IS UNCERTAIN. PLEASE NOTIFY THE MICROBIOLOGY DEPARTMENT WITHIN ONE WEEK IF SPECIATION AND SENSITIVITIES ARE REQUIRED. Performed at [facility], [address] [place], [place] [address]    Report Status 10/24/2023 FINAL  Final  Blood Culture ID Panel (Reflexed)     Status: Abnormal   Collection Time: 10/21/23  1:30 PM  Result Value Ref Range Status   Enterococcus faecalis NOT DETECTED NOT DETECTED Final   Enterococcus Faecium NOT DETECTED NOT DETECTED Final   Listeria monocytogenes NOT DETECTED NOT DETECTED Final   Staphylococcus species DETECTED (A) NOT DETECTED Final    Comment: CRITICAL RESULT CALLED TO, READ BACK BY AND VERIFIED WITH: BAlease Amend PHARMD, AT 0827 10/22/23 D.VANHOOK    Staphylococcus aureus (BCID) NOT DETECTED NOT DETECTED Final   Staphylococcus epidermidis DETECTED (A) NOT DETECTED Final    Comment: CRITICAL RESULT CALLED TO, READ BACK BY AND VERIFIED WITH: BAlease Amend PHARMD, AT 0827 10/22/23 D.VANHOOK    Staphylococcus lugdunensis NOT DETECTED NOT DETECTED Final   Streptococcus species NOT DETECTED NOT DETECTED Final   Streptococcus agalactiae NOT DETECTED NOT DETECTED Final   Streptococcus pneumoniae NOT DETECTED NOT DETECTED Final   Streptococcus pyogenes NOT DETECTED NOT DETECTED Final   A.calcoaceticus-baumannii NOT DETECTED NOT DETECTED Final   Bacteroides fragilis NOT DETECTED NOT DETECTED Final   Enterobacterales NOT DETECTED NOT  DETECTED Final   Enterobacter cloacae complex NOT DETECTED NOT DETECTED Final   Escherichia coli NOT DETECTED NOT DETECTED Final   Klebsiella aerogenes NOT DETECTED NOT DETECTED Final   Klebsiella oxytoca NOT DETECTED NOT DETECTED Final   Klebsiella pneumoniae NOT DETECTED NOT DETECTED Final   Proteus species NOT DETECTED NOT DETECTED Final   Salmonella species NOT DETECTED NOT DETECTED Final   Serratia marcescens NOT DETECTED NOT DETECTED Final   Haemophilus influenzae NOT DETECTED NOT DETECTED Final   Neisseria meningitidis NOT DETECTED NOT DETECTED Final   Pseudomonas aeruginosa NOT DETECTED NOT DETECTED Final   Stenotrophomonas maltophilia NOT DETECTED NOT DETECTED Final   Candida albicans NOT DETECTED NOT DETECTED Final   Candida auris NOT DETECTED NOT DETECTED Final   Candida glabrata NOT DETECTED NOT DETECTED Final   Candida krusei NOT DETECTED NOT DETECTED Final   Candida parapsilosis NOT DETECTED NOT DETECTED Final   Candida tropicalis NOT DETECTED NOT DETECTED Final   Cryptococcus neoformans/gattii NOT DETECTED NOT DETECTED Final   Methicillin resistance mecA/C NOT DETECTED NOT DETECTED Final    Comment: Performed at [facility], [address], [place], [place] [address]  Culture, blood (routine x 2)     Status: None   Collection Time: 10/21/23  2:21 PM   Specimen: BLOOD LEFT ARM  Result Value Ref Range Status   Specimen Description BLOOD LEFT ARM  Final   Special Requests   Final    BOTTLES DRAWN AEROBIC AND ANAEROBIC Blood Culture adequate volume   Culture   Final    NO GROWTH 5 DAYS Performed at [facility], [address], [place], [place] [address]    Report Status 10/26/2023 FINAL  Final  Culture, blood (single) w Reflex to ID Panel     Status: None (Preliminary result)   Collection Time: 10/23/23  9:03 AM   Specimen: BLOOD LEFT ARM  Result Value Ref Range Status   Specimen Description BLOOD LEFT ARM  Final   Special Requests   Final    BOTTLES  DRAWN AEROBIC AND ANAEROBIC Blood Culture adequate volume   Culture   Final    NO GROWTH 3 DAYS Performed at [facility], [address], [place], [place] [address]  Report Status PENDING  Incomplete         Radiology Studies: No results found.        Scheduled Meds:  bictegravir-emtricitabine -tenofovir  AF  1 tablet Oral Daily   carvedilol   6.25 mg Oral BID WC   chlordiazePOXIDE   10 mg Oral QID   doxycycline   100 mg Oral BID WC   famotidine   20 mg Oral BID   folic acid   1 mg Oral Daily   lactulose   10 g Oral BID   magic mouthwash  5 mL Oral QID   multivitamin with minerals  1 tablet Oral Q lunch   nystatin   5 mL Oral QID   pantoprazole  (PROTONIX ) IV  40 mg Intravenous Q12H   potassium chloride   20 mEq Oral Once   potassium chloride   40 mEq Oral Once   sulfamethoxazole -trimethoprim   1 tablet Oral Daily   thiamine   100 mg Oral Daily   Or   thiamine   100 mg Intravenous Daily   Continuous Infusions:  magnesium  sulfate bolus IVPB     magnesium  sulfate bolus IVPB        LOS: 5 days       Seena Dadds, MD [facility]   To contact the attending provider between 7A-7P or the covering provider during after hours 7P-7A, please log into the web site [email or web address] and access using universal [place] password for that web site. If you do not have the password, please call the hospital operator.  10/26/2023, 2:18 PM

## 2023-10-27 ENCOUNTER — Other Ambulatory Visit (HOSPITAL_COMMUNITY): Payer: Self-pay

## 2023-10-27 ENCOUNTER — Telehealth (HOSPITAL_COMMUNITY): Payer: Self-pay | Admitting: Pharmacy Technician

## 2023-10-27 DIAGNOSIS — F10930 Alcohol use, unspecified with withdrawal, uncomplicated: Secondary | ICD-10-CM | POA: Diagnosis not present

## 2023-10-27 LAB — COMPREHENSIVE METABOLIC PANEL WITH GFR
ALT: 93 U/L — ABNORMAL HIGH (ref 0–44)
AST: 150 U/L — ABNORMAL HIGH (ref 15–41)
Albumin: 2.5 g/dL — ABNORMAL LOW (ref 3.5–5.0)
Alkaline Phosphatase: 265 U/L — ABNORMAL HIGH (ref 38–126)
Anion gap: 7 (ref 5–15)
BUN: <5 mg/dL — ABNORMAL LOW (ref 6–20)
CO2: 25 mmol/L (ref 22–32)
Calcium: 8.5 mg/dL — ABNORMAL LOW (ref 8.9–10.3)
Chloride: 105 mmol/L (ref 98–111)
Creatinine, Ser: 0.89 mg/dL (ref 0.61–1.24)
GFR, Estimated: >60 mL/min (ref >60)
Glucose, Bld: 127 mg/dL — ABNORMAL HIGH (ref 70–99)
Potassium: 4 mmol/L (ref 3.5–5.1)
Sodium: 137 mmol/L (ref 135–145)
Total Bilirubin: 4.1 mg/dL — ABNORMAL HIGH (ref 0.0–1.2)
Total Protein: 6.1 g/dL — ABNORMAL LOW (ref 6.5–8.1)

## 2023-10-27 LAB — CBC
HCT: 29.2 % — ABNORMAL LOW (ref 39.0–52.0)
Hemoglobin: 9.8 g/dL — ABNORMAL LOW (ref 13.0–17.0)
MCH: 27 pg (ref 26.0–34.0)
MCHC: 33.6 g/dL (ref 30.0–36.0)
MCV: 80.4 fL (ref 80.0–100.0)
Platelets: 230 10*3/uL (ref 150–400)
RBC: 3.63 MIL/uL — ABNORMAL LOW (ref 4.22–5.81)
RDW: 18.2 % — ABNORMAL HIGH (ref 11.5–15.5)
WBC: 8.8 10*3/uL (ref 4.0–10.5)
nRBC: 1.6 % — ABNORMAL HIGH (ref 0.0–0.2)

## 2023-10-27 LAB — PHOSPHORUS: Phosphorus: 3.1 mg/dL (ref 2.5–4.6)

## 2023-10-27 LAB — GC/CHLAMYDIA PROBE AMP (~~LOC~~) NOT AT ARMC
Chlamydia: NEGATIVE
Chlamydia: NEGATIVE
Comment: NEGATIVE
Comment: NEGATIVE
Comment: NORMAL
Comment: NORMAL
Neisseria Gonorrhea: NEGATIVE
Neisseria Gonorrhea: NEGATIVE

## 2023-10-27 LAB — MAGNESIUM: Magnesium: 1.8 mg/dL (ref 1.7–2.4)

## 2023-10-27 MED ORDER — CARVEDILOL 6.25 MG PO TABS
ORAL_TABLET | ORAL | 0 refills | Status: DC
  Filled 2023-10-27: qty 60, 30d supply, fill #0

## 2023-10-27 MED ORDER — LACTULOSE 10 GM/15ML PO SOLN
10.0000 g | Freq: Two times a day (BID) | ORAL | 0 refills | Status: DC
  Filled 2023-10-27: qty 473, 16d supply, fill #0

## 2023-10-27 MED ORDER — NYSTATIN 100000 UNIT/ML MT SUSP
5.0000 mL | Freq: Four times a day (QID) | OROMUCOSAL | 0 refills | Status: AC
  Filled 2023-10-27: qty 200, 10d supply, fill #0

## 2023-10-27 MED ORDER — BICTEGRAVIR-EMTRICITAB-TENOFOV 50-200-25 MG PO TABS
1.0000 | ORAL_TABLET | Freq: Every day | ORAL | 0 refills | Status: DC
  Filled 2023-10-27: qty 30, 30d supply, fill #0

## 2023-10-27 MED ORDER — DOXYCYCLINE HYCLATE 100 MG PO TABS
100.0000 mg | ORAL_TABLET | Freq: Two times a day (BID) | ORAL | 0 refills | Status: AC
  Filled 2023-10-27: qty 36, 18d supply, fill #0

## 2023-10-27 MED ORDER — SULFAMETHOXAZOLE-TRIMETHOPRIM 800-160 MG PO TABS
1.0000 | ORAL_TABLET | Freq: Every day | ORAL | 0 refills | Status: DC
  Filled 2023-10-27: qty 30, 30d supply, fill #0

## 2023-10-27 MED ORDER — THIAMINE HCL 100 MG PO TABS
100.0000 mg | ORAL_TABLET | Freq: Every day | ORAL | 0 refills | Status: DC
Start: 2023-10-28 — End: 2023-11-07
  Filled 2023-10-27: qty 30, 30d supply, fill #0

## 2023-10-27 MED ORDER — POTASSIUM CHLORIDE CRYS ER 20 MEQ PO TBCR
20.0000 meq | EXTENDED_RELEASE_TABLET | Freq: Four times a day (QID) | ORAL | Status: DC
  Administered 2023-10-27: 20 meq via ORAL
  Filled 2023-10-27: qty 1

## 2023-10-27 MED ORDER — MAGNESIUM SULFATE 2 GM/50ML IV SOLN
2.0000 g | Freq: Once | INTRAVENOUS | Status: AC
  Administered 2023-10-27: 2 g via INTRAVENOUS
  Filled 2023-10-27: qty 50

## 2023-10-27 MED ORDER — CHLORDIAZEPOXIDE HCL 5 MG PO CAPS
ORAL_CAPSULE | ORAL | 0 refills | Status: AC
  Filled 2023-10-27: qty 15, 8d supply, fill #0

## 2023-10-27 NOTE — Discharge Summary (Signed)
 Physician Discharge Summary  Harold Flores BJY:[id number] DOB: February 06, 1991 DOA: 10/21/2023  PCP: Roslyn Coombe, MD  Admit date: 10/21/2023 Discharge date: 10/27/2023  Admitted From: (Home) Disposition:  (Home )  Recommendations for Outpatient Follow-up:  Follow up with PCP in 1-2 weeks Please obtain BMP/CBC in one week Patient to follow-up with his prescheduled appointment with ID clinic Continue counseling about alcohol  abstinence and medication compliance Repeat CT abdomen pelvis with IV contrast to follow-up on normal findings in pancreas   Brief/Interim Summary:  Patient is a 33 y.o. male with history of HIV, HTN, alcohol  use-presented with fatigue/weakness nausea, vomiting, secondary to alcohol  abuse and withdrawals.    Intractable nausea, vomiting and diarrhea Failure to thrive  -Likely due to alcohol  abuse, and early withdrawals. - much improved, advanced to regular diet.  He has been tolerating over the last few days . - Discharged on p.o. Protonix  - Lipase within normal limit   Alcohol  dependence with withdrawals - On CIWA protocol during hospital stay, thiamine , folic acid , as well as started on Librium , as dose has been tapered gradually, he will be discharged on low-dose Librium  taper over next few days.     Hyponatremia - Euvolemic hyponatremia, improved with IV fluids   Severe hypokalemia Hypophosphatemia Hypomagnesemia - With significant electrolyte derangement, required aggressive replacement during hospital stay  Hypocalcemia -Replaced   Transaminitis/liver injury due to alcohol  abuse Hyperilirubinemia Thrombocytopenia Hyperammonemia -This is all due to heavy alcohol  abuse, LFTs has been trending down  - Negative hepatitis panel - Started on lactulose    HIV/AIDS Management per ID Does appear he is with poor compliance,  resumed on Biktarvy , as well as started on Bactrim  for prophylaxis HIV quantitative 61,000 copies, CD40 170 He has been provide  at time of discharge, as well ID appointment been arranged, he is aware of it   Oral thrush -Continue with Magic mouthwash   2/4 blood cultures growing gram-positive cocci 1/4 gram-positive rods -Unclear significance, culture growing Staphylococcus Dermidex, contaminant, no indication to treat  Proctitis - Evidence on imaging, initially on IV Rocephin  and Flagyl , for STI, chlamydia and gonorrhea were sent , results still pending at time of discharge though, he is discharged doxycycline .     Bilateral upper extremity erythema - Patient reports status post peeling at the salon recently, does not appear to be infected, will continue to monitor - This has resolved   Abnormal finding in pancreas on imaging - Further workup can be pursued as an outpatient   NSVT - Noted overnight NSVT, symptomatic, correct electrolyte derangement, continue with beta-blockers, 2D echo with a preserved EF   Macrocytic anemia -Likely due to alcohol  abuse    Pressure ulcer Pressure Injury 10/23/23 Buttocks Right Stage 2 -  Partial thickness loss of dermis presenting as a shallow open injury with a red, pink wound bed without slough. (Active)  10/23/23 1000  Location: Buttocks  Location Orientation: Right  Staging: Stage 2 -  Partial thickness loss of dermis presenting as a shallow open injury with a red, pink wound bed without slough.  Wound Description (Comments):   Present on Admission: Yes     Discharge Diagnoses:  Principal Problem:   Alcohol  withdrawal (HCC) Active Problems:   Colitis   Nausea vomiting and diarrhea   Hyperbilirubinemia   Alcoholic hepatitis without ascites   Pressure injury of skin    Discharge Instructions  Discharge Instructions     Diet - low sodium heart healthy   Complete by: As directed  Discharge instructions   Complete by: As directed    Follow with Primary MD Roslyn Coombe, MD in 7 days   Get CBC, CMP, checked  by Primary MD next visit.       Disposition Home    Diet: Regular Diet  On your next visit with your primary care physician please Get Medicines reviewed and adjusted.   Please request your Prim.MD to go over all Hospital Tests and Procedure/Radiological results at the follow up, please get all Hospital records sent to your Prim MD by signing hospital release before you go home.   If you experience worsening of your admission symptoms, develop shortness of breath, life threatening emergency, suicidal or homicidal thoughts you must seek medical attention immediately by calling 911 or calling your MD immediately  if symptoms less severe.  You Must read complete instructions/literature along with all the possible adverse reactions/side effects for all the Medicines you take and that have been prescribed to you. Take any new Medicines after you have completely understood and accpet all the possible adverse reactions/side effects.   Do not drive, operating heavy machinery, perform activities at heights, swimming or participation in water activities or provide baby sitting services if your were admitted for syncope or siezures until you have seen by Primary MD or a Neurologist and advised to do so again.  Do not drive when taking Pain medications.    Do not take more than prescribed Pain, Sleep and Anxiety Medications  Special Instructions: If you have smoked or chewed Tobacco  in the last 2 yrs please stop smoking, stop any regular Alcohol   and or any Recreational drug use.  Wear Seat belts while driving.   Please note  You were cared for by a hospitalist during your hospital stay. If you have any questions about your discharge medications or the care you received while you were in the hospital after you are discharged, you can call the unit and asked to speak with the hospitalist on call if the hospitalist that took care of you is not available. Once you are discharged, your primary care physician will handle any  further medical issues. Please note that NO REFILLS for any discharge medications will be authorized once you are discharged, as it is imperative that you return to your primary care physician (or establish a relationship with a primary care physician if you do not have one) for your aftercare needs so that they can reassess your need for medications and monitor your lab values.   Increase activity slowly   Complete by: As directed    No wound care   Complete by: As directed       Allergies as of 10/27/2023   No Known Allergies      Medication List     STOP taking these medications    aMILoride  5 MG tablet Commonly known as: MIDAMOR    amLODipine  5 MG tablet Commonly known as: NORVASC    loperamide  2 MG capsule Commonly known as: IMODIUM        TAKE these medications    bictegravir-emtricitabine -tenofovir  AF 50-200-25 MG Tabs tablet Commonly known as: BIKTARVY  Take 1 tablet by mouth daily.   carvedilol  6.25 MG tablet Commonly known as: COREG  TAKE 1 TABLET BY MOUTH 2 (TWO) TIMES DAILY.   chlordiazePOXIDE  5 MG capsule Commonly known as: LIBRIUM  Please take 5 mg oral 3 times daily for 2 days, then 5 mg oral twice daily for 3 days, then 5 mg oral daily for 3 days  then stop.   doxycycline  100 MG tablet Commonly known as: VIBRA -TABS Take 1 tablet (100 mg total) by mouth 2 (two) times daily with a meal for 18 days.   famotidine  20 MG tablet Commonly known as: PEPCID  Take 1 tablet (20 mg total) by mouth 2 (two) times daily.   lactulose  10 GM/15ML solution Commonly known as: CHRONULAC  Take 15 mLs (10 g total) by mouth 2 (two) times daily. Please hold if more than 2-3 loose BMs per day   multivitamin with minerals Tabs tablet Take 1 tablet by mouth daily.   nystatin  100000 UNIT/ML suspension Commonly known as: MYCOSTATIN  Take 5 mLs (500,000 Units total) by mouth 4 (four) times daily for 10 days.   sulfamethoxazole -trimethoprim  800-160 MG tablet Commonly known as:  BACTRIM  DS Take 1 tablet by mouth daily.   thiamine  100 MG tablet Commonly known as: Vitamin B-1 Take 1 tablet (100 mg total) by mouth daily. Start taking on: Oct 28, 2023        Follow-up Information     [facility] - [facility] Dept Of [place]. [address] [facility] Follow up on 11/20/2023.   Specialty: Infectious Diseases Why: Hospital Discharge Follow Up 3:00 pm appointment with Marlan Silva, NP.  Please call if you need transportation assistance 48h before appointment. Contact information: [address] [place], [address] [place]   [address] [phone]               No Known Allergies  Consultations: Infectious disease   Procedures/Studies: ECHOCARDIOGRAM COMPLETE Result Date: 10/24/2023    ECHOCARDIOGRAM REPORT   Patient Name:   Ruthellen Cowden Date of Exam: 10/24/2023 Medical Rec #:  [id number]          Height:       70.0 in Accession #:    [id number]         Weight:       192.7 lb Date of Birth:  Jul 16, 1990           BSA:          2.055 m Patient Age:    33 years           BP:           101/65 mmHg Patient Gender: M                  HR:           90 bpm. Exam Location:  Inpatient Procedure: 2D Echo, Cardiac Doppler and Color Doppler (Both Spectral and Color            Flow Doppler were utilized during procedure). Indications:    I47.2 Ventricular tachycardia  History:        Patient has prior history of Echocardiogram examinations, most                 recent 08/04/2020.  Sonographer:    Andrena Bang Referring Phys: Epifanio Haste IMPRESSIONS  1. Left ventricular ejection fraction, by estimation, is 55 to 60%. The left ventricle has normal function. The left ventricle has no regional wall motion abnormalities. Left ventricular diastolic parameters were normal.  2. Right ventricular systolic function is normal. The right ventricular size is normal. Tricuspid regurgitation signal is inadequate for assessing PA pressure.  3. The mitral valve is  normal in structure. Trivial mitral valve regurgitation. No evidence of mitral stenosis.  4. The aortic valve is tricuspid. Aortic valve regurgitation is not visualized. No aortic  stenosis is present.  5. The inferior vena cava is normal in size with greater than 50% respiratory variability, suggesting right atrial pressure of 3 mmHg. FINDINGS  Left Ventricle: Left ventricular ejection fraction, by estimation, is 55 to 60%. The left ventricle has normal function. The left ventricle has no regional wall motion abnormalities. The left ventricular internal cavity size was normal in size. There is  no left ventricular hypertrophy. Left ventricular diastolic parameters were normal. Right Ventricle: The right ventricular size is normal. No increase in right ventricular wall thickness. Right ventricular systolic function is normal. Tricuspid regurgitation signal is inadequate for assessing PA pressure. Left Atrium: Left atrial size was normal in size. Right Atrium: Right atrial size was normal in size. Pericardium: There is no evidence of pericardial effusion. Mitral Valve: The mitral valve is normal in structure. Trivial mitral valve regurgitation. No evidence of mitral valve stenosis. Tricuspid Valve: The tricuspid valve is normal in structure. Tricuspid valve regurgitation is not demonstrated. Aortic Valve: The aortic valve is tricuspid. Aortic valve regurgitation is not visualized. No aortic stenosis is present. Aortic valve mean gradient measures 3.0 mmHg. Aortic valve peak gradient measures 5.4 mmHg. Aortic valve area, by VTI measures 3.31 cm. Pulmonic Valve: The pulmonic valve was normal in structure. Pulmonic valve regurgitation is trivial. Aorta: The aortic root is normal in size and structure. Venous: The inferior vena cava is normal in size with greater than 50% respiratory variability, suggesting right atrial pressure of 3 mmHg. IAS/Shunts: No atrial level shunt detected by color flow Doppler.  LEFT VENTRICLE  PLAX 2D LVIDd:         4.50 cm      Diastology LVIDs:         3.30 cm      LV e' medial:  9.14 cm/s LV PW:         1.10 cm      LV e' lateral: 15.00 cm/s LV IVS:        0.90 cm LVOT diam:     2.20 cm LV SV:         74 LV SV Index:   36 LVOT Area:     3.80 cm  LV Volumes (MOD) LV vol d, MOD A2C: 113.0 ml LV vol d, MOD A4C: 135.0 ml LV vol s, MOD A2C: 45.5 ml LV vol s, MOD A4C: 47.4 ml LV SV MOD A2C:     67.5 ml LV SV MOD A4C:     135.0 ml LV SV MOD BP:      76.0 ml RIGHT VENTRICLE RV S prime:     12.60 cm/s TAPSE (M-mode): 1.5 cm LEFT ATRIUM           Index LA Vol (A2C): 39.8 ml 19.37 ml/m LA Vol (A4C): 29.6 ml 14.41 ml/m  AORTIC VALVE AV Area (Vmax):    3.31 cm AV Area (Vmean):   2.99 cm AV Area (VTI):     3.31 cm AV Vmax:           116.00 cm/s AV Vmean:          77.400 cm/s AV VTI:            0.223 m AV Peak Grad:      5.4 mmHg AV Mean Grad:      3.0 mmHg LVOT Vmax:         101.00 cm/s LVOT Vmean:        60.900 cm/s LVOT VTI:  0.194 m LVOT/AV VTI ratio: 0.87  AORTA Ao Asc diam: 3.20 cm  SHUNTS Systemic VTI:  0.19 m Systemic Diam: 2.20 cm Dalton McleanMD Electronically signed by Archer Bear Signature Date/Time: 10/24/2023/12:24:05 PM    Final    CT ABDOMEN PELVIS W CONTRAST Result Date: 10/21/2023 CLINICAL DATA:  n/v/elevated LFTs EXAM: CT ABDOMEN AND PELVIS WITH CONTRAST TECHNIQUE: Multidetector CT imaging of the abdomen and pelvis was performed using the standard protocol following bolus administration of intravenous contrast. RADIATION DOSE REDUCTION: This exam was performed according to the departmental dose-optimization program which includes automated exposure control, adjustment of the mA and/or kV according to patient size and/or use of iterative reconstruction technique. CONTRAST:  75mL OMNIPAQUE  IOHEXOL  350 MG/ML SOLN COMPARISON:  November 11, 2022 FINDINGS: Lower chest: No acute abnormality. Hepatobiliary: Profound hepatic steatosis. Cholelithiasis versus gallbladder wall calcifications of  the fundus. No pericholecystic fluid. No intrahepatic or extrahepatic biliary ductal dilation. Pancreas: Unremarkable. No pancreatic ductal dilatation or surrounding inflammatory changes. However inferior to the pancreas and distinct from the pancreas ,there is ill-defined soft tissue density along the SMA and SMV. This measures approximately 29 x 15 by 22 mm (series 7, image 71; series 3, image 42 this ( Spleen: Unremarkable Adrenals/Urinary Tract: Adrenal glands are unremarkable. Kidneys enhance symmetrically. No hydronephrosis or obstructing nephrolithiasis. Bladder is unremarkable. Stomach/Bowel: No evidence of bowel obstruction. Colon is diffusely decompressed. There is circumferential wall prominence of the rectum. Appendix is normal. Submucosal fat throughout the majority of the colon. Stomach is decompressed Vascular/Lymphatic: Age advanced atherosclerotic calcifications of the nonaneurysmal abdominal aorta. Reproductive: Prostate is present. Other: No free air or free fluid. Musculoskeletal: No acute or significant osseous findings. IMPRESSION: 1. Profound hepatic steatosis. Cholelithiasis versus gallbladder wall calcification without evidence of acute cholecystitis. 2. There is ill-defined soft tissue density along the SMA and SMV inferior to the pancreas. This could reflect sequela of prior pancreatitis, shotty lymphadenopathy or other nonspecific process such as fibromatosis. Recommend correlation with laboratory values. Recommend repeat CT abdomen pelvis with contrast within 3-6 months to assess for stability. Alternatively, further characterization could also be performed with dedicated outpatient abdominal MRI. 3. There is circumferential wall prominence of the rectum. This could reflect a nonspecific infectious or inflammatory colitis. Recommend correlation with symptoms. 4. Age advanced atherosclerotic calcifications of the nonaneurysmal abdominal aorta. Aortic Atherosclerosis (ICD10-I70.0).  Electronically Signed   By: Clancy Crimes M.D.   On: 10/21/2023 12:50   DG Chest Portable 1 View Result Date: 10/21/2023 CLINICAL DATA:  Tachycardia.  Shakiness and chills. EXAM: PORTABLE CHEST 1 VIEW COMPARISON:  11/11/2022. FINDINGS: The heart size and mediastinal contours are within normal limits. Chronic eventration of the right hemidiaphragm. Both lungs are clear. No pleural effusion or pneumothorax. The visualized skeletal structures are unremarkable. IMPRESSION: No acute cardiopulmonary findings. Electronically Signed   By: Mannie Seek M.D.   On: 10/21/2023 12:21   (Echo, Carotid, EGD, Colonoscopy, ERCP)    Subjective:  No significant events overnight, eager to go home today. Discharge Exam: Vitals:   10/26/23 2325 10/27/23 0352  BP: 106/68 114/81  Pulse:    Resp: (!) 21 20  Temp: 98.5 F (36.9 C) 98.5 F (36.9 C)  SpO2:     Vitals:   10/26/23 1704 10/26/23 1931 10/26/23 2325 10/27/23 0352  BP: 115/78 105/79 106/68 114/81  Pulse:  88    Resp: 18 18 (!) 21 20  Temp: (!) 97.3 F (36.3 C) 97.8 F (36.6 C) 98.5 F (36.9 C) 98.5 F (36.9  C)  TempSrc: Oral Oral Oral Oral  SpO2: 97% 97%    Weight:      Height:        General: Pt is alert, awake, not in acute distress Cardiovascular: RRR, S1/S2 +, no rubs, no gallops Respiratory: CTA bilaterally, no wheezing, no rhonchi Abdominal: Soft, NT, ND, bowel sounds + Extremities: no edema, no cyanosis    The results of significant diagnostics from this hospitalization (including imaging, microbiology, ancillary and laboratory) are listed below for reference.     Microbiology: Recent Results (from the past 240 hours)  Culture, blood (routine x 2)     Status: Abnormal   Collection Time: 10/21/23  1:30 PM   Specimen: BLOOD RIGHT ARM  Result Value Ref Range Status   Specimen Description BLOOD RIGHT ARM  Final   Special Requests   Final    BOTTLES DRAWN AEROBIC AND ANAEROBIC Blood Culture results may not be  optimal due to an inadequate volume of blood received in culture bottles   Culture  Setup Time   Final    GRAM POSITIVE COCCI IN BOTH AEROBIC AND ANAEROBIC BOTTLES CRITICAL RESULT CALLED TO, READ BACK BY AND VERIFIED WITHNorberta Beans PHARMD, AT 0827 10/22/23 D.VANHOOK GRAM POSITIVE RODS AEROBIC BOTTLE ONLY CRITICAL RESULT CALLED TO, READ BACK BY AND VERIFIED WITH: PHARMD BOBBY JO STONER ON 10/22/23 @ 1336 BY DRT    Culture (A)  Final    COAGULASE NEGATIVE STAPHYLOCOCCUS STAPHYLOCOCCUS EPIDERMIDIS THE SIGNIFICANCE OF ISOLATING THIS ORGANISM FROM A SINGLE SET OF BLOOD CULTURES WHEN MULTIPLE SETS ARE DRAWN IS UNCERTAIN. PLEASE NOTIFY THE MICROBIOLOGY DEPARTMENT WITHIN ONE WEEK IF SPECIATION AND SENSITIVITIES ARE REQUIRED. Performed at [facility], [address], [place], [place] [address]    Report Status 10/24/2023 FINAL  Final  Blood Culture ID Panel (Reflexed)     Status: Abnormal   Collection Time: 10/21/23  1:30 PM  Result Value Ref Range Status   Enterococcus faecalis NOT DETECTED NOT DETECTED Final   Enterococcus Faecium NOT DETECTED NOT DETECTED Final   Listeria monocytogenes NOT DETECTED NOT DETECTED Final   Staphylococcus species DETECTED (A) NOT DETECTED Final    Comment: CRITICAL RESULT CALLED TO, READ BACK BY AND VERIFIED WITH: BAlease Amend PHARMD, AT 0827 10/22/23 D.VANHOOK    Staphylococcus aureus (BCID) NOT DETECTED NOT DETECTED Final   Staphylococcus epidermidis DETECTED (A) NOT DETECTED Final    Comment: CRITICAL RESULT CALLED TO, READ BACK BY AND VERIFIED WITH: BAlease Amend PHARMD, AT 0827 10/22/23 D.VANHOOK    Staphylococcus lugdunensis NOT DETECTED NOT DETECTED Final   Streptococcus species NOT DETECTED NOT DETECTED Final   Streptococcus agalactiae NOT DETECTED NOT DETECTED Final   Streptococcus pneumoniae NOT DETECTED NOT DETECTED Final   Streptococcus pyogenes NOT DETECTED NOT DETECTED Final   A.calcoaceticus-baumannii NOT DETECTED NOT DETECTED Final    Bacteroides fragilis NOT DETECTED NOT DETECTED Final   Enterobacterales NOT DETECTED NOT DETECTED Final   Enterobacter cloacae complex NOT DETECTED NOT DETECTED Final   Escherichia coli NOT DETECTED NOT DETECTED Final   Klebsiella aerogenes NOT DETECTED NOT DETECTED Final   Klebsiella oxytoca NOT DETECTED NOT DETECTED Final   Klebsiella pneumoniae NOT DETECTED NOT DETECTED Final   Proteus species NOT DETECTED NOT DETECTED Final   Salmonella species NOT DETECTED NOT DETECTED Final   Serratia marcescens NOT DETECTED NOT DETECTED Final   Haemophilus influenzae NOT DETECTED NOT DETECTED Final   Neisseria meningitidis NOT DETECTED NOT DETECTED Final   Pseudomonas aeruginosa NOT  DETECTED NOT DETECTED Final   Stenotrophomonas maltophilia NOT DETECTED NOT DETECTED Final   Candida albicans NOT DETECTED NOT DETECTED Final   Candida auris NOT DETECTED NOT DETECTED Final   Candida glabrata NOT DETECTED NOT DETECTED Final   Candida krusei NOT DETECTED NOT DETECTED Final   Candida parapsilosis NOT DETECTED NOT DETECTED Final   Candida tropicalis NOT DETECTED NOT DETECTED Final   Cryptococcus neoformans/gattii NOT DETECTED NOT DETECTED Final   Methicillin resistance mecA/C NOT DETECTED NOT DETECTED Final    Comment: Performed at [facility], [address], [place], [place] [address]  Culture, blood (routine x 2)     Status: None   Collection Time: 10/21/23  2:21 PM   Specimen: BLOOD LEFT ARM  Result Value Ref Range Status   Specimen Description BLOOD LEFT ARM  Final   Special Requests   Final    BOTTLES DRAWN AEROBIC AND ANAEROBIC Blood Culture adequate volume   Culture   Final    NO GROWTH 5 DAYS Performed at [facility], [address], [place], [place] [address]    Report Status 10/26/2023 FINAL  Final  Culture, blood (single) w Reflex to ID Panel     Status: None (Preliminary result)   Collection Time: 10/23/23  9:03 AM   Specimen: BLOOD LEFT ARM  Result Value Ref Range  Status   Specimen Description BLOOD LEFT ARM  Final   Special Requests   Final    BOTTLES DRAWN AEROBIC AND ANAEROBIC Blood Culture adequate volume   Culture   Final    NO GROWTH 4 DAYS Performed at [facility], [address], [place], [place] [address]    Report Status PENDING  Incomplete     Labs: BNP (last 3 results) No results for input(s): "BNP" in the last 8760 hours. Basic Metabolic Panel: Recent Labs  Lab 10/22/23 1508 10/23/23 0432 10/24/23 0331 10/25/23 0320 10/26/23 0736 10/27/23 0349  NA  --  130* 134* 138 139 137  K  --  3.4* 3.4* 3.1* 3.5 4.0  CL  --  93* 98 102 104 105  CO2  --  26 24 23 23 25   GLUCOSE  --  86 94 112* 114* 127*  BUN  --  5* <5* <5* <5* <5*  CREATININE  --  0.97 0.91 0.93 0.88 0.89  CALCIUM   --  7.2* 7.9* 8.1* 8.1* 8.5*  MG 2.7* 1.9 1.5* 1.4* 1.5* 1.8  PHOS 5.1*  --  1.8* 1.7* 4.1 3.1   Liver Function Tests: Recent Labs  Lab 10/23/23 0903 10/24/23 0331 10/25/23 0320 10/26/23 0736 10/27/23 0349  AST 331* 232* 197* 177* 150*  ALT 166* 128* 113* 112* 93*  ALKPHOS 205* 190* 210* 259* 265*  BILITOT 6.3* 5.7* 4.9* 5.0* 4.1*  PROT 6.3* 5.8* 5.9* 6.3* 6.1*  ALBUMIN  2.0* 2.4* 2.8* 2.7* 2.5*   Recent Labs  Lab 10/22/23 0851 10/22/23 1508  LIPASE 21 23   Recent Labs  Lab 10/22/23 1508 10/23/23 0903 10/24/23 0331  AMMONIA 45* 43* 49*   CBC: Recent Labs  Lab 10/21/23 1010 10/21/23 1926 10/23/23 0903 10/24/23 0331 10/25/23 0320 10/26/23 0736 10/27/23 0349  WBC 9.7   < > 5.5 5.1 5.7 7.1 8.8  NEUTROABS 6.3  --   --   --   --   --   --   HGB 12.9*   < > 10.8* 9.3* 9.6* 10.1* 9.8*  HCT 34.4*   < > 30.1*  26.9* 27.5* 30.2* 29.2*  MCV 71.5*   < > 74.9* 76.2* 77.7* 79.5* 80.4  PLT 65*   < > 70* 82* 129* 172 230   < > = values in this interval not displayed.   Cardiac Enzymes: Recent Labs  Lab 10/21/23 1421  CKTOTAL 312   BNP: Invalid input(s): "POCBNP" CBG: Recent Labs  Lab 10/21/23 1033  GLUCAP 109*    D-Dimer No results for input(s): "DDIMER" in the last 72 hours. Hgb A1c No results for input(s): "HGBA1C" in the last 72 hours. Lipid Profile No results for input(s): "CHOL", "HDL", "LDLCALC", "TRIG", "CHOLHDL", "LDLDIRECT" in the last 72 hours. Thyroid  function studies No results for input(s): "TSH", "T4TOTAL", "T3FREE", "THYROIDAB" in the last 72 hours.  Invalid input(s): "FREET3" Anemia work up Recent Labs    10/25/23 0320  VITAMINB12 2,382*  FOLATE 6.0   Urinalysis    Component Value Date/Time   COLORURINE YELLOW 11/11/2022 1206   APPEARANCEUR CLEAR 11/11/2022 1206   LABSPEC 1.006 11/11/2022 1206   PHURINE 7.0 11/11/2022 1206   GLUCOSEU NEGATIVE 11/11/2022 1206   GLUCOSEU NEGATIVE 11/26/2021 1040   HGBUR MODERATE (A) 11/11/2022 1206   BILIRUBINUR NEGATIVE 11/11/2022 1206   KETONESUR NEGATIVE 11/11/2022 1206   PROTEINUR 30 (A) 11/11/2022 1206   UROBILINOGEN 2.0 (A) 11/26/2021 1040   NITRITE NEGATIVE 11/11/2022 1206   LEUKOCYTESUR NEGATIVE 11/11/2022 1206   Sepsis Labs Recent Labs  Lab 10/24/23 0331 10/25/23 0320 10/26/23 0736 10/27/23 0349  WBC 5.1 5.7 7.1 8.8   Microbiology Recent Results (from the past 240 hours)  Culture, blood (routine x 2)     Status: Abnormal   Collection Time: 10/21/23  1:30 PM   Specimen: BLOOD RIGHT ARM  Result Value Ref Range Status   Specimen Description BLOOD RIGHT ARM  Final   Special Requests   Final    BOTTLES DRAWN AEROBIC AND ANAEROBIC Blood Culture results may not be optimal due to an inadequate volume of blood received in culture bottles   Culture  Setup Time   Final    GRAM POSITIVE COCCI IN BOTH AEROBIC AND ANAEROBIC BOTTLES CRITICAL RESULT CALLED TO, READ BACK BY AND VERIFIED WITHNorberta Beans PHARMD, AT 0827 10/22/23 D.VANHOOK GRAM POSITIVE RODS AEROBIC BOTTLE ONLY CRITICAL RESULT CALLED TO, READ BACK BY AND VERIFIED WITH: PHARMD BOBBY JO STONER ON 10/22/23 @ 1336 BY DRT    Culture (A)  Final    COAGULASE NEGATIVE  STAPHYLOCOCCUS STAPHYLOCOCCUS EPIDERMIDIS THE SIGNIFICANCE OF ISOLATING THIS ORGANISM FROM A SINGLE SET OF BLOOD CULTURES WHEN MULTIPLE SETS ARE DRAWN IS UNCERTAIN. PLEASE NOTIFY THE MICROBIOLOGY DEPARTMENT WITHIN ONE WEEK IF SPECIATION AND SENSITIVITIES ARE REQUIRED. Performed at [facility], [address] [place], [place] [address]    Report Status 10/24/2023 FINAL  Final  Blood Culture ID Panel (Reflexed)     Status: Abnormal   Collection Time: 10/21/23  1:30 PM  Result Value Ref Range Status   Enterococcus faecalis NOT DETECTED NOT DETECTED Final   Enterococcus Faecium NOT DETECTED NOT DETECTED Final   Listeria monocytogenes NOT DETECTED NOT DETECTED Final   Staphylococcus species DETECTED (A) NOT DETECTED Final    Comment: CRITICAL RESULT CALLED TO, READ BACK BY AND VERIFIED WITH: BAlease Amend PHARMD, AT 0827 10/22/23 D.VANHOOK    Staphylococcus aureus (BCID) NOT DETECTED NOT DETECTED Final   Staphylococcus epidermidis DETECTED (A) NOT DETECTED Final    Comment: CRITICAL RESULT CALLED TO, READ BACK BY AND VERIFIED WITH: B. STONER PHARMD, AT  1610 10/22/23 D.VANHOOK    Staphylococcus lugdunensis NOT DETECTED NOT DETECTED Final   Streptococcus species NOT DETECTED NOT DETECTED Final   Streptococcus agalactiae NOT DETECTED NOT DETECTED Final   Streptococcus pneumoniae NOT DETECTED NOT DETECTED Final   Streptococcus pyogenes NOT DETECTED NOT DETECTED Final   A.calcoaceticus-baumannii NOT DETECTED NOT DETECTED Final   Bacteroides fragilis NOT DETECTED NOT DETECTED Final   Enterobacterales NOT DETECTED NOT DETECTED Final   Enterobacter cloacae complex NOT DETECTED NOT DETECTED Final   Escherichia coli NOT DETECTED NOT DETECTED Final   Klebsiella aerogenes NOT DETECTED NOT DETECTED Final   Klebsiella oxytoca NOT DETECTED NOT DETECTED Final   Klebsiella pneumoniae NOT DETECTED NOT DETECTED Final   Proteus species NOT DETECTED NOT DETECTED Final   Salmonella species NOT DETECTED NOT  DETECTED Final   Serratia marcescens NOT DETECTED NOT DETECTED Final   Haemophilus influenzae NOT DETECTED NOT DETECTED Final   Neisseria meningitidis NOT DETECTED NOT DETECTED Final   Pseudomonas aeruginosa NOT DETECTED NOT DETECTED Final   Stenotrophomonas maltophilia NOT DETECTED NOT DETECTED Final   Candida albicans NOT DETECTED NOT DETECTED Final   Candida auris NOT DETECTED NOT DETECTED Final   Candida glabrata NOT DETECTED NOT DETECTED Final   Candida krusei NOT DETECTED NOT DETECTED Final   Candida parapsilosis NOT DETECTED NOT DETECTED Final   Candida tropicalis NOT DETECTED NOT DETECTED Final   Cryptococcus neoformans/gattii NOT DETECTED NOT DETECTED Final   Methicillin resistance mecA/C NOT DETECTED NOT DETECTED Final    Comment: Performed at [facility], [address] [place], [place] [address]  Culture, blood (routine x 2)     Status: None   Collection Time: 10/21/23  2:21 PM   Specimen: BLOOD LEFT ARM  Result Value Ref Range Status   Specimen Description BLOOD LEFT ARM  Final   Special Requests   Final    BOTTLES DRAWN AEROBIC AND ANAEROBIC Blood Culture adequate volume   Culture   Final    NO GROWTH 5 DAYS Performed at [facility], [address], [place], [place] [address]    Report Status 10/26/2023 FINAL  Final  Culture, blood (single) w Reflex to ID Panel     Status: None (Preliminary result)   Collection Time: 10/23/23  9:03 AM   Specimen: BLOOD LEFT ARM  Result Value Ref Range Status   Specimen Description BLOOD LEFT ARM  Final   Special Requests   Final    BOTTLES DRAWN AEROBIC AND ANAEROBIC Blood Culture adequate volume   Culture   Final    NO GROWTH 4 DAYS Performed at [facility], [address], [place], [place] [address]    Report Status PENDING  Incomplete     Time coordinating discharge: Over 30 minutes  SIGNED:   Seena Dadds, MD  [facility] 10/27/2023, 9:59 AM Pager   If 7PM-7AM, please contact  night-coverage [email or web address] Password [email or web address]

## 2023-10-27 NOTE — Progress Notes (Addendum)
 DISCHARGE NOTE HOME Harold Flores to be discharged Home per MD order. Discussed prescriptions and follow up appointments with the patient. Prescriptions given to patient; medication list explained in detail. Patient verbalized understanding.  Skin clean, dry and intact without evidence of skin break down, no evidence of skin tears noted. IV catheter discontinued intact. Site without signs and symptoms of complications. Dressing and pressure applied. Pt denies pain at the site currently. No complaints noted.  See LDA for wound discharging with  Patient free of other lines, drains, and wounds.   An After Visit Summary (AVS) was printed and given to the patient. Patient to discharge lounge to wait for mediations.  Tonda Francisco, RN

## 2023-10-27 NOTE — Plan of Care (Signed)
   Problem: Education: Goal: Knowledge of General Education information will improve Description Including pain rating scale, medication(s)/side effects and non-pharmacologic comfort measures Outcome: Progressing   Problem: Clinical Measurements: Goal: Will remain free from infection Outcome: Progressing Goal: Diagnostic test results will improve Outcome: Progressing Goal: Cardiovascular complication will be avoided Outcome: Progressing   Problem: Activity: Goal: Risk for activity intolerance will decrease Outcome: Progressing   Problem: Coping: Goal: Level of anxiety will decrease Outcome: Progressing

## 2023-10-27 NOTE — Progress Notes (Signed)
 Brief ID Note:   Discharge planning in progress. DW Dr. Osborne Blazer -   Will send 30d supply for Biktarvy  1 tab daily, Bactrim  1 DS tab daily, Nystatin  swish to complete 10d and Doxy 100  mg BID to complete 21 days for concern over STI related proctocolitis symptoms (also received a few days of Ceftriaxone  INP).  Avoided diflucan  with elevated LFTs.   FU with Greg Calone scheduled 11/20/23 @ 3:00 pm   Will need to FU on ETOH dependence and treatment options outpatient.   ID will sign off - please call back with any questions/concerns or if we can be of further assistance.    Gibson Kurtz, MSN, NP-C [facility] for Infectious Disease [facility]  [email or web address] Pager: [phone] Office: [phone] [facility] Main Line: [phone] *Secure Chat Communication Welcome

## 2023-10-27 NOTE — Telephone Encounter (Signed)
 Patient Product/process development scientist completed.    The patient is insured through [facility].     Ran test claim for Biktarvy  and the current 30 day co-pay is $0.00.   This test claim was processed through [facility]- copay amounts may vary at other pharmacies due to pharmacy/plan contracts, or as the patient moves through the different stages of their insurance plan.     Morgan Arab, CPHT Pharmacy Technician III Certified Patient Advocate [facility] Patient Advocate Team Direct Number: [phone]  Fax: [phone]

## 2023-10-28 ENCOUNTER — Telehealth: Payer: Self-pay

## 2023-10-28 LAB — CULTURE, BLOOD (SINGLE)
Culture: NO GROWTH
Special Requests: ADEQUATE

## 2023-10-28 NOTE — Transitions of Care (Post Inpatient/ED Visit) (Signed)
 10/28/2023  Name: Harold Flores MRN: [id number] DOB: 1990-10-31  Today's TOC FU Call Status: Today's TOC FU Call Status:: Successful TOC FU Call Completed TOC FU Call Complete Date: 10/28/23 Patient's Name and Date of Birth confirmed.  Transition Care Management Follow-up Telephone Call Date of Discharge: 10/27/23 Discharge Facility: [facility]) Type of Discharge: Inpatient Admission Primary Inpatient Discharge Diagnosis:: alcohol  use How have you been since you were released from the hospital?: Better Any questions or concerns?: No  Items Reviewed: Did you receive and understand the discharge instructions provided?: Yes Medications obtained,verified, and reconciled?: Yes (Medications Reviewed) Any new allergies since your discharge?: No Dietary orders reviewed?: NA Do you have support at home?: No  Medications Reviewed Today: Medications Reviewed Today     Reviewed by Darrall Ellison, LPN (Licensed Practical Nurse) on 10/28/23 at 1116  Med List Status: <None>   Medication Order Taking? Sig Documenting Provider Last Dose Status Informant  bictegravir-emtricitabine -tenofovir  AF (BIKTARVY ) 50-200-25 MG TABS tablet [id number]  Take 1 tablet by mouth daily. Orson Blalock, NP  Active   carvedilol  (COREG ) 6.25 MG tablet [id number]  TAKE 1 TABLET BY MOUTH 2 (TWO) TIMES DAILY. Elgergawy, Ardia Kraft, MD  Active   chlordiazePOXIDE  (LIBRIUM ) 5 MG capsule [id number]  1 capsule (5 mg total) 3 (three) times daily for 2 days, THEN 1 capsule (5 mg total) 2 (two) times daily for 3 days, THEN 1 capsule (5 mg total) daily for 3 days, THEN STOP. Elgergawy, Ardia Kraft, MD  Active   doxycycline  (VIBRA -TABS) 100 MG tablet [id number]  Take 1 tablet (100 mg total) by mouth 2 (two) times daily with a meal for 18 days. Orson Blalock, NP  Active   famotidine  (PEPCID ) 20 MG tablet [id number] No Take 1 tablet (20 mg total) by mouth 2 (two) times daily. Roslyn Coombe, MD 10/21/2023 Morning Active Self,  Pharmacy Records  lactulose  (CHRONULAC ) 10 GM/15ML solution [id number]  Take 15 mLs (10 g total) by mouth 2 (two) times daily. Please hold if more than 2-3 loose BMs per day Elgergawy, Ardia Kraft, MD  Active   Multiple Vitamin (MULTIVITAMIN WITH MINERALS) TABS tablet [id number] No Take 1 tablet by mouth daily. Audria Leather, MD Past Week Active Self, Pharmacy Records  nystatin  (MYCOSTATIN ) 100000 UNIT/ML suspension [id number]  Take 5 mLs (500,000 Units total) by mouth 4 (four) times daily for 10 days. Orson Blalock, NP  Active   sulfamethoxazole -trimethoprim  (BACTRIM  DS) 800-160 MG tablet [id number]  Take 1 tablet by mouth daily. Orson Blalock, NP  Active   thiamine  (VITAMIN B1) 100 MG tablet [id number]  Take 1 tablet (100 mg total) by mouth daily. Elgergawy, Ardia Kraft, MD  Active             Home Care and Equipment/Supplies: Were Home Health Services Ordered?: NA Any new equipment or medical supplies ordered?: NA  Functional Questionnaire: Do you need assistance with bathing/showering or dressing?: No Do you need assistance with meal preparation?: No Do you need assistance with eating?: No Do you have difficulty maintaining continence: No Do you need assistance with getting out of bed/getting out of a chair/moving?: No Do you have difficulty managing or taking your medications?: No  Follow up appointments reviewed: PCP Follow-up appointment confirmed?: Yes Date of PCP follow-up appointment?: 11/07/23 Follow-up Provider: [facility] Follow-up appointment confirmed?: Yes Date of Specialist follow-up appointment?: 11/20/23 Follow-Up Specialty Provider:: ID Do you need transportation to your follow-up appointment?: No Do you understand care options  if your condition(s) worsen?: Yes-patient verbalized understanding    SIGNATURE Darrall Ellison, LPN [facility] Nurse Health Advisor Direct Dial [phone]

## 2023-11-04 ENCOUNTER — Telehealth: Payer: Self-pay | Admitting: Internal Medicine

## 2023-11-04 DIAGNOSIS — I10 Essential (primary) hypertension: Secondary | ICD-10-CM

## 2023-11-04 NOTE — Telephone Encounter (Signed)
 Ok labs are ordered to be done at his convenience

## 2023-11-04 NOTE — Telephone Encounter (Signed)
 Message has been sent to Pt via Mychart.

## 2023-11-04 NOTE — Telephone Encounter (Signed)
 Copied from CRM [id number]. Topic: Clinical - Request for Lab/Test Order >> Nov 04, 2023  8:11 AM Yolande Hench C wrote: Reason for CRM: Patient has requested labs for BMP/CBC blood test; please follow up with patient when lab appt can be scheduled; patient prefers to be messaged via MyChart but patients [phone] also leave detailed voicemail)

## 2023-11-07 ENCOUNTER — Ambulatory Visit: Admitting: Internal Medicine

## 2023-11-07 ENCOUNTER — Other Ambulatory Visit: Payer: Self-pay | Admitting: Internal Medicine

## 2023-11-07 ENCOUNTER — Ambulatory Visit: Payer: Self-pay | Admitting: Internal Medicine

## 2023-11-07 ENCOUNTER — Encounter: Payer: Self-pay | Admitting: Internal Medicine

## 2023-11-07 VITALS — BP 140/82 | HR 80 | Temp 97.7°F | Ht 70.0 in | Wt 200.0 lb

## 2023-11-07 DIAGNOSIS — F32A Depression, unspecified: Secondary | ICD-10-CM

## 2023-11-07 DIAGNOSIS — F1010 Alcohol abuse, uncomplicated: Secondary | ICD-10-CM

## 2023-11-07 DIAGNOSIS — E559 Vitamin D deficiency, unspecified: Secondary | ICD-10-CM | POA: Diagnosis not present

## 2023-11-07 DIAGNOSIS — R739 Hyperglycemia, unspecified: Secondary | ICD-10-CM

## 2023-11-07 DIAGNOSIS — D539 Nutritional anemia, unspecified: Secondary | ICD-10-CM

## 2023-11-07 DIAGNOSIS — R7989 Other specified abnormal findings of blood chemistry: Secondary | ICD-10-CM

## 2023-11-07 DIAGNOSIS — I10 Essential (primary) hypertension: Secondary | ICD-10-CM | POA: Diagnosis not present

## 2023-11-07 DIAGNOSIS — D509 Iron deficiency anemia, unspecified: Secondary | ICD-10-CM

## 2023-11-07 DIAGNOSIS — F419 Anxiety disorder, unspecified: Secondary | ICD-10-CM

## 2023-11-07 DIAGNOSIS — R1900 Intra-abdominal and pelvic swelling, mass and lump, unspecified site: Secondary | ICD-10-CM

## 2023-11-07 DIAGNOSIS — E7800 Pure hypercholesterolemia, unspecified: Secondary | ICD-10-CM | POA: Diagnosis not present

## 2023-11-07 DIAGNOSIS — F17200 Nicotine dependence, unspecified, uncomplicated: Secondary | ICD-10-CM

## 2023-11-07 LAB — CBC WITH DIFFERENTIAL/PLATELET
Basophils Absolute: 0.1 10*3/uL (ref 0.0–0.1)
Basophils Relative: 1.2 % (ref 0.0–3.0)
Eosinophils Absolute: 0.1 10*3/uL (ref 0.0–0.7)
Eosinophils Relative: 0.8 % (ref 0.0–5.0)
HCT: 32.9 % — ABNORMAL LOW (ref 39.0–52.0)
Hemoglobin: 10.4 g/dL — ABNORMAL LOW (ref 13.0–17.0)
Lymphocytes Relative: 34.8 % (ref 12.0–46.0)
Lymphs Abs: 2.7 10*3/uL (ref 0.7–4.0)
MCHC: 31.8 g/dL (ref 30.0–36.0)
MCV: 85.6 fl (ref 78.0–100.0)
Monocytes Absolute: 1.1 10*3/uL — ABNORMAL HIGH (ref 0.1–1.0)
Monocytes Relative: 13.6 % — ABNORMAL HIGH (ref 3.0–12.0)
Neutro Abs: 3.8 10*3/uL (ref 1.4–7.7)
Neutrophils Relative %: 49.6 % (ref 43.0–77.0)
Platelets: 442 10*3/uL — ABNORMAL HIGH (ref 150.0–400.0)
RBC: 3.84 Mil/uL — ABNORMAL LOW (ref 4.22–5.81)
RDW: 19.8 % — ABNORMAL HIGH (ref 11.5–15.5)
WBC: 7.8 10*3/uL (ref 4.0–10.5)

## 2023-11-07 LAB — BASIC METABOLIC PANEL WITH GFR
BUN: 6 mg/dL (ref 6–23)
CO2: 26 meq/L (ref 19–32)
Calcium: 9.2 mg/dL (ref 8.4–10.5)
Chloride: 107 meq/L (ref 96–112)
Creatinine, Ser: 0.68 mg/dL (ref 0.40–1.50)
GFR: 123.17 mL/min (ref >60.00)
Glucose, Bld: 93 mg/dL (ref 70–99)
Potassium: 3.9 meq/L (ref 3.5–5.1)
Sodium: 141 meq/L (ref 135–145)

## 2023-11-07 LAB — IBC PANEL
Iron: 76 ug/dL (ref 42–165)
Saturation Ratios: 30.7 % (ref 20.0–50.0)
TIBC: 247.8 ug/dL — ABNORMAL LOW (ref 250.0–450.0)
Transferrin: 177 mg/dL — ABNORMAL LOW (ref 212.0–360.0)

## 2023-11-07 LAB — URINALYSIS, ROUTINE W REFLEX MICROSCOPIC
Hgb urine dipstick: NEGATIVE
Ketones, ur: NEGATIVE
Leukocytes,Ua: NEGATIVE
Nitrite: NEGATIVE
RBC / HPF: NONE SEEN (ref 0–?)
Specific Gravity, Urine: >=1.03 — AB (ref 1.000–1.030)
Total Protein, Urine: NEGATIVE
Urine Glucose: NEGATIVE
Urobilinogen, UA: 0.2 (ref 0.0–1.0)
pH: 6 (ref 5.0–8.0)

## 2023-11-07 LAB — VITAMIN B12: Vitamin B-12: 1022 pg/mL — ABNORMAL HIGH (ref 211–911)

## 2023-11-07 LAB — HEPATIC FUNCTION PANEL
ALT: 59 U/L — ABNORMAL HIGH (ref 0–53)
AST: 131 U/L — ABNORMAL HIGH (ref 0–37)
Albumin: 3.7 g/dL (ref 3.5–5.2)
Alkaline Phosphatase: 144 U/L — ABNORMAL HIGH (ref 39–117)
Bilirubin, Direct: 0.9 mg/dL — ABNORMAL HIGH (ref 0.0–0.3)
Total Bilirubin: 2 mg/dL — ABNORMAL HIGH (ref 0.2–1.2)
Total Protein: 7.4 g/dL (ref 6.0–8.3)

## 2023-11-07 LAB — LIPID PANEL
Cholesterol: 204 mg/dL — ABNORMAL HIGH (ref 0–200)
HDL: 23.9 mg/dL — ABNORMAL LOW (ref >39.00)
LDL Cholesterol: 154 mg/dL — ABNORMAL HIGH (ref 0–99)
NonHDL: 180.38
Total CHOL/HDL Ratio: 9
Triglycerides: 131 mg/dL (ref 0.0–149.0)
VLDL: 26.2 mg/dL (ref 0.0–40.0)

## 2023-11-07 LAB — AMMONIA: Ammonia: 34 umol/L (ref 11–35)

## 2023-11-07 LAB — MAGNESIUM: Magnesium: 1.5 mg/dL (ref 1.5–2.5)

## 2023-11-07 LAB — HEMOGLOBIN A1C: Hgb A1c MFr Bld: 5.1 % (ref 4.6–6.5)

## 2023-11-07 LAB — FERRITIN: Ferritin: 678.2 ng/mL — ABNORMAL HIGH (ref 22.0–322.0)

## 2023-11-07 LAB — TSH: TSH: 5 u[IU]/mL (ref 0.35–5.50)

## 2023-11-07 MED ORDER — BUPROPION HCL ER (XL) 150 MG PO TB24: 150.0000 mg | ORAL_TABLET | Freq: Every day | ORAL | 3 refills | Status: AC

## 2023-11-07 MED ORDER — THIAMINE HCL 100 MG PO TABS: 100.0000 mg | ORAL_TABLET | Freq: Every day | ORAL | 3 refills | Status: AC

## 2023-11-07 MED ORDER — FAMOTIDINE 20 MG PO TABS: 20.0000 mg | ORAL_TABLET | Freq: Two times a day (BID) | ORAL | 3 refills | Status: AC

## 2023-11-07 MED ORDER — CARVEDILOL 12.5 MG PO TABS: 12.5000 mg | ORAL_TABLET | Freq: Two times a day (BID) | ORAL | 3 refills | Status: AC

## 2023-11-07 NOTE — Progress Notes (Signed)
 Patient ID: Harold Flores, male   DOB: 1990/12/10, 33 y.o.   MRN: [id number]        Chief Complaint: follow up post hospn may 13  - 19 2025 with ETOH withdrawal,       HPI:  Harold Flores is a 33 y.o. male here with above, On CIWA protocol during hospital stay, thiamine , folic acid , as well as started on Librium , as dose has been tapered gradually, he will be discharged on low-dose Librium  taper over next few days.  Multiple electrolytes required aggressive replacement, and LFT's improving at dc.   Protonix  stated, and Lactulose  started with elevated ammonia.  May have had recent poor Biktarvy  compliance, started Bactrim  for prophylaxis.  Pt has appt with ID early November 20 2023  ,  D/c with empiric doxycycline  for proctitis after rocephin  and flagyl .  .  Also with stage 2 pressure injury right buttocks. Had abnormal appearance of Pancreas on imaging - likely needs f/u imaging.  Pt denies chest pain, increased sob or doe, wheezing, orthopnea, PND, increased LE swelling, palpitations, dizziness or syncope.   Pt denies polydipsia, polyuria, or new focal neuro s/s.          Wt Readings from Last 3 Encounters:  11/07/23 200 lb (90.7 kg)  10/21/23 192 lb 10.9 oz (87.4 kg)  12/23/22 206 lb (93.4 kg)   BP Readings from Last 3 Encounters:  11/07/23 (!) 140/82  10/27/23 110/62  12/23/22 118/78         Past Medical History:  Diagnosis Date   Anal fissure    Anal fistula    Anal ulcer    Asthma    Epistaxis    GAD (generalized anxiety disorder)    GERD (gastroesophageal reflux disease)    Hemorrhoid    HIV (human immunodeficiency virus infection) (HCC)    Hypertension    Syphilis    Thrush 03/25/2022   Varicella    Past Surgical History:  Procedure Laterality Date   EYE SURGERY  1994   blephroplasty right eye   RADIOLOGY WITH ANESTHESIA N/A 08/08/2020   Procedure: MRI WITH ANESTHESIA- BRAIN WITH AND WITHOUT CONTRAST,  CERVICAL SPINE WITH AND WITHOUT CONTRAST, LUMBAR SPINE WITH WITHOUT  CONTRAST, THORACIC SPINE WITH WITHOUT CONTRAST;  Surgeon: Radiologist, Medication, MD;  Location: [facility] OR;  Service: Radiology;  Laterality: N/A;    reports that he has been smoking e-cigarettes and cigarettes. He has a 2.5 pack-year smoking history. He has never used smokeless tobacco. He reports current alcohol  use of about 7.0 standard drinks of alcohol  per week. He reports current drug use. Drug: Marijuana. family history includes Diabetes in his maternal grandmother and mother; Heart disease in his paternal grandfather; Hyperlipidemia in his mother. No Known Allergies Current Outpatient Medications on File Prior to Visit  Medication Sig Dispense Refill   bictegravir-emtricitabine -tenofovir  AF (BIKTARVY ) 50-200-25 MG TABS tablet Take 1 tablet by mouth daily. 30 tablet 0   doxycycline  (VIBRA -TABS) 100 MG tablet Take 1 tablet (100 mg total) by mouth 2 (two) times daily with a meal for 18 days. 36 tablet 0   lactulose  (CHRONULAC ) 10 GM/15ML solution Take 15 mLs (10 g total) by mouth 2 (two) times daily. Please hold if more than 2-3 loose BMs per day 473 mL 0   Multiple Vitamin (MULTIVITAMIN WITH MINERALS) TABS tablet Take 1 tablet by mouth daily.     sulfamethoxazole -trimethoprim  (BACTRIM  DS) 800-160 MG tablet Take 1 tablet by mouth daily. 30 tablet 0  No current facility-administered medications on file prior to visit.        ROS:  All others reviewed and negative.  Objective        PE:  BP (!) 140/82 (BP Location: Right Arm, Patient Position: Sitting, Cuff Size: Normal)   Pulse 80   Temp 97.7 F (36.5 C) (Oral)   Ht 5\' 10"  (1.778 m)   Wt 200 lb (90.7 kg)   SpO2 98%   BMI 28.70 kg/m                 Constitutional: Pt appears in NAD               HENT: Head: NCAT.                Right Ear: External ear normal.                 Left Ear: External ear normal.                Eyes: . Pupils are equal, round, and reactive to light. Conjunctivae and EOM are normal               Nose:  without d/c or deformity               Neck: Neck supple. Gross normal ROM               Cardiovascular: Normal rate and regular rhythm.                 Pulmonary/Chest: Effort normal and breath sounds without rales or wheezing.                Abd:  Soft, NT, ND, + BS, no organomegaly               Neurological: Pt is alert. At baseline orientation, motor grossly intact               Skin: Skin is warm. No rashes, no other new lesions, LE edema - none               Psychiatric: Pt behavior is normal without agitation   Micro: none  Cardiac tracings I have personally interpreted today:  none  Pertinent Radiological findings (summarize): none   Lab Results  Component Value Date   WBC 7.8 11/07/2023   HGB 10.4 (L) 11/07/2023   HCT 32.9 (L) 11/07/2023   PLT 442.0 (H) 11/07/2023   GLUCOSE 93 11/07/2023   CHOL 204 (H) 11/07/2023   TRIG 131.0 11/07/2023   HDL 23.90 (L) 11/07/2023   LDLCALC 154 (H) 11/07/2023   ALT 59 (H) 11/07/2023   AST 131 (H) 11/07/2023   NA 141 11/07/2023   K 3.9 11/07/2023   CL 107 11/07/2023   CREATININE 0.68 11/07/2023   BUN 6 11/07/2023   CO2 26 11/07/2023   TSH 5.00 11/07/2023   INR 1.3 (H) 10/22/2023   HGBA1C 5.1 11/07/2023   Assessment/Plan:  Harold Flores is a 33 y.o. Black or African American [2] male with  has a past medical history of Anal fissure, Anal fistula, Anal ulcer, Asthma, Epistaxis, GAD (generalized anxiety disorder), GERD (gastroesophageal reflux disease), Hemorrhoid, HIV (human immunodeficiency virus infection) (HCC), Hypertension, Syphilis, Thrush (03/25/2022), and Varicella.  ETOH abuse Pt has been abstinent post d/c, and encouragd to continue this  Essential hypertension, benign BP Readings from Last 3 Encounters:  11/07/23 (!) 140/82  10/27/23 110/62  12/23/22 118/78  uncontrolled, pt for increased coreg  12.5 bid   Vitamin D  deficiency disease Last vitamin D  Lab Results  Component Value Date   VD25OH 23.06 (L)  11/26/2021   Low, to start oral replacement   Hypomagnesemia Also for magnesium  check  Anemia Lab Results  Component Value Date   WBC 7.8 11/07/2023   HGB 10.4 (L) 11/07/2023   HCT 32.9 (L) 11/07/2023   MCV 85.6 11/07/2023   PLT 442.0 (H) 11/07/2023   Mild persisttent, iron ok,  to f/u any worsening symptoms or concerns  Intra-abdominal and pelvic swelling, mass and lump, unspecified site Also for f/u MRI abdomen  Smoker Pt counsled to quit, pt not ready  Depression Mild to mod, for start wellbutrin  xl 150 mg every day, refer cousenling,  to f/u any worsening symptoms or concerns  Followup: Return in about 4 months (around 03/09/2024).  Rosalia Colonel, MD 11/09/2023 12:00 PM [facility]

## 2023-11-07 NOTE — Patient Instructions (Addendum)
 Ok to increase the coreg  to 12.5 mg twice per day  Please take all new medication as prescribed - the Wellbutrin xl 150 mg per day  Please continue all other medications as before, and refills have been done except for the biktarvy  and bactrim  to be addressed per Infectious Disease  Please stop smoking, and no further alcohol   Please have the pharmacy call with any other refills you may need.  Please continue your efforts at being more active, low cholesterol diet, and weight control.  You are otherwise up to date with prevention measures today.  Please keep your appointments with your specialists as you may have planned  You will be contacted regarding the referral for: Counseling, and the MRI abdomen for the spot near the pancreas  Please go to the LAB at the blood drawing area for the tests to be done  You will be contacted by phone if any changes need to be made immediately.  Otherwise, you will receive a letter about your results with an explanation, but please check with MyChart first.  Please make an Appointment to return in 4 months, or sooner if needed

## 2023-11-07 NOTE — Progress Notes (Signed)
 The test results show that your current treatment is OK, as the tests are stable.  Please continue the same plan.  There is no other need for change of treatment or further evaluation based on these results, at this time.  thanks

## 2023-11-09 ENCOUNTER — Encounter: Payer: Self-pay | Admitting: Internal Medicine

## 2023-11-09 DIAGNOSIS — R1900 Intra-abdominal and pelvic swelling, mass and lump, unspecified site: Secondary | ICD-10-CM | POA: Insufficient documentation

## 2023-11-09 DIAGNOSIS — F32A Depression, unspecified: Secondary | ICD-10-CM | POA: Insufficient documentation

## 2023-11-09 NOTE — Assessment & Plan Note (Signed)
 Mild to mod, for start wellbutrin  xl 150 mg every day, refer cousenling,  to f/u any worsening symptoms or concerns

## 2023-11-09 NOTE — Assessment & Plan Note (Signed)
 BP Readings from Last 3 Encounters:  11/07/23 (!) 140/82  10/27/23 110/62  12/23/22 118/78   uncontrolled, pt for increased coreg  12.5 bid

## 2023-11-09 NOTE — Assessment & Plan Note (Signed)
 Also for f/u MRI abdomen

## 2023-11-09 NOTE — Assessment & Plan Note (Signed)
 Pt has been abstinent post d/c, and encouragd to continue this

## 2023-11-09 NOTE — Assessment & Plan Note (Signed)
 Pt counsled to quit, pt not ready

## 2023-11-09 NOTE — Assessment & Plan Note (Signed)
 Lab Results  Component Value Date   WBC 7.8 11/07/2023   HGB 10.4 (L) 11/07/2023   HCT 32.9 (L) 11/07/2023   MCV 85.6 11/07/2023   PLT 442.0 (H) 11/07/2023   Mild persisttent, iron ok,  to f/u any worsening symptoms or concerns

## 2023-11-09 NOTE — Assessment & Plan Note (Signed)
Last vitamin D Lab Results  Component Value Date   VD25OH 23.06 (L) 11/26/2021   Low, to start oral replacement

## 2023-11-09 NOTE — Assessment & Plan Note (Signed)
 Also for magnesium  check

## 2023-11-19 ENCOUNTER — Telehealth: Payer: Self-pay

## 2023-11-19 DIAGNOSIS — B20 Human immunodeficiency virus [HIV] disease: Secondary | ICD-10-CM

## 2023-11-19 MED ORDER — SULFAMETHOXAZOLE-TRIMETHOPRIM 800-160 MG PO TABS: 1.0000 | ORAL_TABLET | Freq: Every day | ORAL | 0 refills | Status: DC

## 2023-11-19 MED ORDER — BICTEGRAVIR-EMTRICITAB-TENOFOV 50-200-25 MG PO TABS: 1.0000 | ORAL_TABLET | Freq: Every day | ORAL | 0 refills | Status: DC

## 2023-11-19 NOTE — Telephone Encounter (Signed)
 Notified by front desk that patient thought his appointment was today. He took the wrong day off work and will need to reschedule the appointment for tomorrow.  Will send in one refill of Biktarvy  and Bactrim . He reported to front desk that he is about to run out of the supply that was prescribed 5/19 at the hospital.   Arlon Bergamo, BSN, RN

## 2023-11-20 ENCOUNTER — Ambulatory Visit: Admitting: Family

## 2023-11-27 ENCOUNTER — Other Ambulatory Visit (HOSPITAL_COMMUNITY): Payer: Self-pay

## 2023-11-27 MED ORDER — SULFAMETHOXAZOLE-TRIMETHOPRIM 800-160 MG PO TABS: 1.0000 | ORAL_TABLET | Freq: Every day | ORAL | 0 refills | Status: DC

## 2023-11-27 MED ORDER — BICTEGRAVIR-EMTRICITAB-TENOFOV 50-200-25 MG PO TABS: 1.0000 | ORAL_TABLET | Freq: Every day | ORAL | 0 refills | Status: DC

## 2023-11-27 NOTE — Telephone Encounter (Signed)
 Patient left voicemail stating he called [facility] pharmacy in [place] and was told they did not have refills on file. Would like medication sent to local pharmacy. Refills sent to preferred pharmacy. Will call back if he has any trouble filling medication. Julien Odor, RMA

## 2023-11-27 NOTE — Addendum Note (Signed)
 Addended by: Linus Weckerly M on: 11/27/2023 10:07 AM   Modules accepted: Orders

## 2023-12-02 ENCOUNTER — Other Ambulatory Visit (HOSPITAL_COMMUNITY)
Admission: RE | Admit: 2023-12-02 | Discharge: 2023-12-02 | Disposition: A | Discharge status: Home / Self Care | Source: Ambulatory Visit | Attending: Internal Medicine | Admitting: Internal Medicine

## 2023-12-02 ENCOUNTER — Ambulatory Visit: Admitting: Internal Medicine

## 2023-12-02 ENCOUNTER — Encounter: Payer: Self-pay | Admitting: Internal Medicine

## 2023-12-02 ENCOUNTER — Other Ambulatory Visit: Payer: Self-pay

## 2023-12-02 VITALS — BP 135/88 | HR 85 | Temp 97.5°F | Ht 70.0 in | Wt 206.0 lb

## 2023-12-02 DIAGNOSIS — Z113 Encounter for screening for infections with a predominantly sexual mode of transmission: Secondary | ICD-10-CM | POA: Insufficient documentation

## 2023-12-02 DIAGNOSIS — Z1159 Encounter for screening for other viral diseases: Secondary | ICD-10-CM

## 2023-12-02 DIAGNOSIS — Z111 Encounter for screening for respiratory tuberculosis: Secondary | ICD-10-CM

## 2023-12-02 DIAGNOSIS — B20 Human immunodeficiency virus [HIV] disease: Secondary | ICD-10-CM

## 2023-12-02 MED ORDER — SULFAMETHOXAZOLE-TRIMETHOPRIM 400-80 MG PO TABS
1.0000 | ORAL_TABLET | Freq: Every day | ORAL | 11 refills | Status: AC
Start: 2023-12-02 — End: 2024-11-26

## 2023-12-02 MED ORDER — BICTEGRAVIR-EMTRICITAB-TENOFOV 50-200-25 MG PO TABS: 1.0000 | ORAL_TABLET | Freq: Every day | ORAL | 11 refills | Status: AC

## 2023-12-02 NOTE — Progress Notes (Signed)
 Subjective:  Chief complaint: followup for HIV, AIDS, alcoholism severe hypokalemia   Patient ID: Harold Flores, male    DOB: 08-Aug-1990, 33 y.o.   MRN: [id number]  HPI  Harold Flores is 33 year old Black man with HIV/AIDS, hx of Guillain Barre syndrome, alcoholism whom I saw  on 03/25/2022 in context of recurrent heavy drinking, not eating, not taking his medications. His mother had brought him in and was very concerned that his labs would be off. Indeed where he was severely hypokalemic and anemic.  I had to have him come back to the emergency department for admission where he had his potassium and magnesium  repleted received blood transfusions.  He has been started back on his Biktarvy  as well as Bactrim  for PCP prophylaxis and his antihypertensives.  He is missing his amlodipine  however and was hypertensive in the clinic today.  He claims he has been sober since prior to his admission.   He feels confident he will succeed in his sobriety though he is not in a formal support program. He is living with his mother and grandmother.   ------------ 12/02/23 id clinic f/u Patient normally sees my partner dr Fleeta Rothman This is more of a hospital f/u Mid 10/2023 admission to [place] for alcohol  withdrawal/hepatitis, found to have also thrush and proctocolitis Sent out on 21 days doxycycline  for proctocolitis sx and refill a month of biktarvy  and bactrim  prophylaxis Was off biktarvy  for the year prior Lab Results  Component Value Date   HIV1RNAQUANT 61,100 10/21/2023   Lab Results  Component Value Date   CD4TCELL 14 (L) 10/21/2023   CD4TABS 170 (L) 10/21/2023   Last sexually active 3 months prior -- does want std screening  Hx rectal fistula baseline no pain or discharge   Only 1 missed dose biktarvy  the past month  Other medical hx -- gbs  ?medicaid  Past Medical History:  Diagnosis Date   Anal fissure    Anal fistula    Anal ulcer    Asthma    Epistaxis    GAD  (generalized anxiety disorder)    GERD (gastroesophageal reflux disease)    Hemorrhoid    HIV (human immunodeficiency virus infection) (HCC)    Hypertension    Syphilis    Thrush 03/25/2022   Varicella     Past Surgical History:  Procedure Laterality Date   EYE SURGERY  1994   blephroplasty right eye   RADIOLOGY WITH ANESTHESIA N/A 08/08/2020   Procedure: MRI WITH ANESTHESIA- BRAIN WITH AND WITHOUT CONTRAST,  CERVICAL SPINE WITH AND WITHOUT CONTRAST, LUMBAR SPINE WITH WITHOUT CONTRAST, THORACIC SPINE WITH WITHOUT CONTRAST;  Surgeon: Radiologist, Medication, MD;  Location: [facility] OR;  Service: Radiology;  Laterality: N/A;    Family History  Problem Relation Age of Onset   Diabetes Mother    Hyperlipidemia Mother    Diabetes Maternal Grandmother    Heart disease Paternal Grandfather    Cancer Neg Hx       Social History   Socioeconomic History   Marital status: Single    Spouse name: Not on file   Number of children: 0   Years of education: 13   Highest education level: Not on file  Occupational History   Occupation: [employer]  Tobacco Use   Smoking status: Every Day    Current packs/day: 0.50    Average packs/day: 0.5 packs/day for 5.0 years (2.5 ttl pk-yrs)    Types: E-cigarettes, Cigarettes   Smokeless tobacco: Never  Substance  and Sexual Activity   Alcohol  use: Yes    Alcohol /week: 7.0 standard drinks of alcohol     Types: 7 Cans of beer per week    Comment: daily drinker, bootleggers (beer) x 6-10/ day   Drug use: Yes    Types: Marijuana    Comment: occ   Sexual activity: Yes    Partners: Male    Birth control/protection: Condom    Comment: DECLINED CONDOMS  Other Topics Concern   Not on file  Social History Narrative   Lives at home   A student   architectural games on computer   Fun: Music   Denies religious beliefs that would effect health care.    Social Drivers of Corporate investment banker Strain: Not on file  Food Insecurity: No Food Insecurity  (10/23/2023)   Hunger Vital Sign    Worried About Running Out of Food in the Last Year: Never true    Ran Out of Food in the Last Year: Never true  Transportation Needs: No Transportation Needs (10/23/2023)   PRAPARE - Administrator, Civil Service (Medical): No    Lack of Transportation (Non-Medical): No  Physical Activity: Not on file  Stress: Not on file  Social Connections: Not on file    No Known Allergies   Current Outpatient Medications:    bictegravir-emtricitabine -tenofovir  AF (BIKTARVY ) 50-200-25 MG TABS tablet, Take 1 tablet by mouth daily., Disp: 30 tablet, Rfl: 0   buPROPion  (WELLBUTRIN  XL) 150 MG 24 hr tablet, Take 1 tablet (150 mg total) by mouth daily., Disp: 90 tablet, Rfl: 3   carvedilol  (COREG ) 12.5 MG tablet, Take 1 tablet (12.5 mg total) by mouth 2 (two) times daily with a meal., Disp: 180 tablet, Rfl: 3   famotidine  (PEPCID ) 20 MG tablet, Take 1 tablet (20 mg total) by mouth 2 (two) times daily., Disp: 180 tablet, Rfl: 3   lactulose  (CHRONULAC ) 10 GM/15ML solution, Take 15 mLs (10 g total) by mouth 2 (two) times daily. Please hold if more than 2-3 loose BMs per day, Disp: 473 mL, Rfl: 0   Multiple Vitamin (MULTIVITAMIN WITH MINERALS) TABS tablet, Take 1 tablet by mouth daily., Disp: , Rfl:    sulfamethoxazole -trimethoprim  (BACTRIM  DS) 800-160 MG tablet, Take 1 tablet by mouth daily., Disp: 30 tablet, Rfl: 0   thiamine  (VITAMIN B1) 100 MG tablet, Take 1 tablet (100 mg total) by mouth daily., Disp: 90 tablet, Rfl: 3   ROS: All other ros negative     Objective:   Physical Exam Constitutional:      Appearance: He is well-developed.  HENT:     Head: Normocephalic and atraumatic.   Eyes:     Conjunctiva/sclera: Conjunctivae normal.    Cardiovascular:     Rate and Rhythm: Normal rate and regular rhythm.  Pulmonary:     Effort: Pulmonary effort is normal. No respiratory distress.     Breath sounds: No wheezing.  Abdominal:     General: There is  no distension.     Palpations: Abdomen is soft.   Musculoskeletal:        General: No tenderness. Normal range of motion.     Cervical back: Normal range of motion and neck supple.   Skin:    General: Skin is warm and dry.     Coloration: Skin is not pale.     Findings: No erythema or rash.   Neurological:     General: No focal deficit present.     Mental  Status: He is alert and oriented to person, place, and time.   Psychiatric:        Mood and Affect: Mood normal.        Behavior: Behavior normal.        Thought Content: Thought content normal.        Judgment: Judgment normal.           Assessment & Plan:   Depression and alcoholism:  I have given him resources for mental health, substance abuse in our community and at our clinic  He still has the paperwork and certainly will reach out.  In the interim I will schedule him for follow-up in a month time with me.  HIV AIDS  I will add order HIV viral load w reflex to genotype,  CD4 count CBC with differential CMP, RPR GC and chlamydia and I will continue  Gabrien A Glandon's Biktarvy  and Bactrim   Hypokalemia: Resolved we will recheck CMP today.  Severe anemia likely anemia of chronic disease: Recheck CBC today  Vaccine counseling: recommended COVID 19 and flu shot.   -------------- 12/02/23 id clinic assessment  #hiv [facility] Off meds 2024-mid 2025; reengaged 10/2023 admitted to [place] for etoh hepatitis and found to have thrush/proctocolitis  1 missed dose biktarvy  medicaid  -discussed u=u -encourage compliance -continue current HIV medication -labs today -f/u in 3 months   #hx anal fistula Per epic chart review -- was seeing dr Bernarda Ned at some point 11/2023 no complaint   #proctocolitis Finished the 3 weeks doxy No swab testing done during admission Assymptomatic today 11/2023  #thrush Not present today 11/2023  #hcm -tb Testing ordered 11/2023 -hepatitis Prior hep b sAb  positive/passive immune; repeat titer today -std Triple screen/rpr -cancer screening If rectal issue remains will send screen for anal cancer

## 2023-12-02 NOTE — Patient Instructions (Signed)
 Continue biktarvy , and bactrim    Renewal placed   See us  in 3 months with dr Fleeta Rothman   If rectal symptoms remain an issue/worse, please let us  know sooner  thanks

## 2023-12-02 NOTE — Addendum Note (Signed)
 Addended byBETHA OVERTON FAITH T on: 12/02/2023 09:56 AM   Modules accepted: Orders

## 2023-12-03 LAB — URINE CYTOLOGY ANCILLARY ONLY
Chlamydia: NEGATIVE
Comment: NEGATIVE
Comment: NEGATIVE
Comment: NORMAL
Neisseria Gonorrhea: NEGATIVE
Trichomonas: NEGATIVE

## 2023-12-03 LAB — CYTOLOGY, (ORAL, ANAL, URETHRAL) ANCILLARY ONLY
Chlamydia: NEGATIVE
Chlamydia: NEGATIVE
Comment: NEGATIVE
Comment: NEGATIVE
Comment: NORMAL
Comment: NORMAL
Neisseria Gonorrhea: NEGATIVE
Neisseria Gonorrhea: NEGATIVE

## 2023-12-04 LAB — HIV-1 RNA QUANT-NO REFLEX-BLD: HIV-1 RNA Quant, Log: <1.3 {Log_copies}/mL — AB

## 2023-12-04 LAB — QUANTIFERON-TB GOLD PLUS: QuantiFERON-TB Gold Plus: NEGATIVE

## 2023-12-04 LAB — RPR TITER: RPR Titer: 1:1 {titer} — ABNORMAL HIGH

## 2023-12-05 ENCOUNTER — Ambulatory Visit: Payer: Self-pay | Admitting: Internal Medicine

## 2023-12-05 LAB — COMPLETE METABOLIC PANEL WITHOUT GFR
AG Ratio: 1.3 (calc) (ref 1.0–2.5)
ALT: 32 U/L (ref 9–46)
AST: 32 U/L (ref 10–40)
Albumin: 4.1 g/dL (ref 3.6–5.1)
Alkaline phosphatase (APISO): 105 U/L (ref 36–130)
BUN/Creatinine Ratio: 8 (calc) (ref 6–22)
BUN: 6 mg/dL — ABNORMAL LOW (ref 7–25)
CO2: 25 mmol/L (ref 20–32)
Calcium: 9.4 mg/dL (ref 8.6–10.3)
Chloride: 103 mmol/L (ref 98–110)
Creat: 0.8 mg/dL (ref 0.60–1.26)
Globulin: 3.1 g/dL (ref 1.9–3.7)
Glucose, Bld: 92 mg/dL (ref 65–99)
Potassium: 4 mmol/L (ref 3.5–5.3)
Sodium: 138 mmol/L (ref 135–146)
Total Bilirubin: 1 mg/dL (ref 0.2–1.2)
Total Protein: 7.2 g/dL (ref 6.1–8.1)

## 2023-12-05 LAB — CBC
HCT: 36.8 % — ABNORMAL LOW (ref 38.5–50.0)
Hemoglobin: 11.4 g/dL — ABNORMAL LOW (ref 13.2–17.1)
MCH: 27.5 pg (ref 27.0–33.0)
MCHC: 31 g/dL — ABNORMAL LOW (ref 32.0–36.0)
MCV: 88.7 fL (ref 80.0–100.0)
MPV: 9.7 fL (ref 7.5–12.5)
Platelets: 404 10*3/uL — ABNORMAL HIGH (ref 140–400)
RBC: 4.15 10*6/uL — ABNORMAL LOW (ref 4.20–5.80)
RDW: 15.7 % — ABNORMAL HIGH (ref 11.0–15.0)
WBC: 6.4 10*3/uL (ref 3.8–10.8)

## 2023-12-05 LAB — QUANTIFERON-TB GOLD PLUS
Mitogen-NIL: 6.2 [IU]/mL
NIL: 0.03 [IU]/mL
TB1-NIL: 0.01 [IU]/mL
TB2-NIL: 0.01 [IU]/mL

## 2023-12-05 LAB — HIV-1 RNA QUANT-NO REFLEX-BLD: HIV 1 RNA Quant: <20 {copies}/mL — AB

## 2023-12-05 LAB — T PALLIDUM AB: T Pallidum Abs: POSITIVE — AB

## 2023-12-05 LAB — RPR: RPR Ser Ql: REACTIVE — AB

## 2023-12-05 LAB — HEPATITIS B SURFACE ANTIBODY, QUANTITATIVE: Hep B S AB Quant (Post): 71 m[IU]/mL (ref >=10)

## 2023-12-11 ENCOUNTER — Encounter: Payer: Self-pay | Admitting: Internal Medicine

## 2023-12-14 ENCOUNTER — Ambulatory Visit
Admission: RE | Admit: 2023-12-14 | Discharge: 2023-12-14 | Disposition: A | Discharge status: Home / Self Care | Payer: PRIVATE HEALTH INSURANCE | Source: Ambulatory Visit | Attending: Internal Medicine | Admitting: Internal Medicine

## 2023-12-14 DIAGNOSIS — R1900 Intra-abdominal and pelvic swelling, mass and lump, unspecified site: Secondary | ICD-10-CM

## 2023-12-14 MED ORDER — GADOPICLENOL 0.5 MMOL/ML IV SOLN
9.3000 mL | Freq: Once | INTRAVENOUS | Status: AC | PRN
  Administered 2023-12-14: 9.3 mL via INTRAVENOUS

## 2023-12-26 ENCOUNTER — Other Ambulatory Visit: Payer: Self-pay | Admitting: Infectious Diseases

## 2024-01-06 NOTE — Telephone Encounter (Signed)
 Error

## 2024-02-26 ENCOUNTER — Ambulatory Visit: Payer: PRIVATE HEALTH INSURANCE | Admitting: Internal Medicine

## 2024-03-26 ENCOUNTER — Other Ambulatory Visit: Payer: Self-pay

## 2024-03-26 ENCOUNTER — Ambulatory Visit: Admitting: Internal Medicine

## 2024-03-26 ENCOUNTER — Encounter: Payer: Self-pay | Admitting: Internal Medicine

## 2024-03-26 VITALS — BP 137/96 | HR 94 | Temp 97.7°F | Ht 70.0 in | Wt 224.0 lb

## 2024-03-26 DIAGNOSIS — Z79899 Other long term (current) drug therapy: Secondary | ICD-10-CM | POA: Diagnosis not present

## 2024-03-26 DIAGNOSIS — Z23 Encounter for immunization: Secondary | ICD-10-CM | POA: Diagnosis not present

## 2024-03-26 DIAGNOSIS — B20 Human immunodeficiency virus [HIV] disease: Secondary | ICD-10-CM

## 2024-03-26 DIAGNOSIS — Z113 Encounter for screening for infections with a predominantly sexual mode of transmission: Secondary | ICD-10-CM | POA: Diagnosis not present

## 2024-03-26 DIAGNOSIS — Z8613 Personal history of malaria: Secondary | ICD-10-CM | POA: Diagnosis not present

## 2024-03-26 NOTE — Progress Notes (Signed)
 Subjective:  Chief complaint: followup for HIV, AIDS, alcoholism severe hypokalemia   Patient ID: Harold Flores, male    DOB: 11-02-1990, 33 y.o.   MRN: [id number]  HPI  Harold Flores is 33 year old Black man with HIV/AIDS, hx of Guillain Barre syndrome, alcoholism whom I saw  on 03/25/2022 in context of recurrent heavy drinking, not eating, not taking his medications. His mother had brought him in and was very concerned that his labs would be off. Indeed where he was severely hypokalemic and anemic.  I had to have him come back to the emergency department for admission where he had his potassium and magnesium  repleted received blood transfusions.  He has been started back on his Biktarvy  as well as Bactrim  for PCP prophylaxis and his antihypertensives.  He is missing his amlodipine  however and was hypertensive in the clinic today.  He claims he has been sober since prior to his admission.   He feels confident he will succeed in his sobriety though he is not in a formal support program. He is living with his mother and grandmother.   ------------ 12/02/23 id clinic f/u Patient normally sees my partner dr Fleeta Rothman This is more of a hospital f/u Mid 10/2023 admission to [place] for alcohol  withdrawal/hepatitis, found to have also thrush and proctocolitis Sent out on 21 days doxycycline  for proctocolitis sx and refill a month of biktarvy  and bactrim  prophylaxis Was off biktarvy  for the year prior  Last sexually active 3 months prior -- does want std screening  Hx rectal fistula baseline no pain or discharge   Only 1 missed dose biktarvy  the past month  Other medical hx -- gbs  ?medicaid   ---------------- 03/26/24 id clinic f/u On biktarvy  still; 2 missed doses the last 4 months No health concern Blood pressure is high Social --  Work at home, 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 since that date has been abstinent from alcohol  Still vaping nicotine  but no smoking No substance  otherwise Sexually not really active; last sexual encounter a week ago He last saw dr Bernarda Ned for anal fistula about 9-10 years ago (2016 -- planned surgery for fistula but had healed itself since then) - no tenesmus, pain, discharge  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; prior to that was late latent in 2014    Hx guillan barre: Feet with neuropathy still    Past Medical History:  Diagnosis Date   Anal fissure    Anal fistula    Anal ulcer    Asthma    Epistaxis    GAD (generalized anxiety disorder)    GERD (gastroesophageal reflux disease)    Hemorrhoid    HIV (human immunodeficiency virus infection) (HCC)    Hypertension    Syphilis    Thrush 03/25/2022   Varicella     Past Surgical History:  Procedure Laterality Date   EYE SURGERY  1994   blephroplasty right eye   RADIOLOGY WITH ANESTHESIA N/A 08/08/2020   Procedure: MRI WITH ANESTHESIA- BRAIN WITH AND WITHOUT CONTRAST,  CERVICAL SPINE WITH AND WITHOUT CONTRAST, LUMBAR SPINE WITH WITHOUT CONTRAST, THORACIC SPINE WITH WITHOUT CONTRAST;  Surgeon: Radiologist, Medication, MD;  Location: [facility] OR;  Service: Radiology;  Laterality: N/A;    Family History  Problem Relation Age of Onset   Diabetes Mother    Hyperlipidemia Mother    Diabetes Maternal Grandmother    Heart disease Paternal Grandfather    Cancer Neg Hx  Social History   Socioeconomic History   Marital status: Single    Spouse name: Not on file   Number of children: 0   Years of education: 13   Highest education level: Not on file  Occupational History   Occupation: [employer]  Tobacco Use   Smoking status: Every Day    Current packs/day: 0.50    Average packs/day: 0.5 packs/day for 5.0 years (2.5 ttl pk-yrs)    Types: E-cigarettes, Cigarettes   Smokeless tobacco: Never  Substance and Sexual Activity   Alcohol  use: Yes    Alcohol /week: 7.0 standard drinks of alcohol     Types: 7 Cans of beer per week    Comment: daily  drinker, bootleggers (beer) x 6-10/ day   Drug use: Yes    Types: Marijuana    Comment: occ   Sexual activity: Yes    Partners: Male    Birth control/protection: Condom    Comment: DECLINED CONDOMS  Other Topics Concern   Not on file  Social History Narrative   Lives at home   A student   architectural games on computer   Fun: Music   Denies religious beliefs that would effect health care.    Social Drivers of Corporate investment banker Strain: Not on file  Food Insecurity: No Food Insecurity (10/23/2023)   Hunger Vital Sign    Worried About Running Out of Food in the Last Year: Never true    Ran Out of Food in the Last Year: Never true  Transportation Needs: No Transportation Needs (10/23/2023)   PRAPARE - Administrator, Civil Service (Medical): No    Lack of Transportation (Non-Medical): No  Physical Activity: Not on file  Stress: Not on file  Social Connections: Not on file    No Known Allergies   Current Outpatient Medications:    bictegravir-emtricitabine -tenofovir  AF (BIKTARVY ) 50-200-25 MG TABS tablet, Take 1 tablet by mouth daily., Disp: 30 tablet, Rfl: 11   buPROPion  (WELLBUTRIN  XL) 150 MG 24 hr tablet, Take 1 tablet (150 mg total) by mouth daily., Disp: 90 tablet, Rfl: 3   carvedilol  (COREG ) 12.5 MG tablet, Take 1 tablet (12.5 mg total) by mouth 2 (two) times daily with a meal., Disp: 180 tablet, Rfl: 3   famotidine  (PEPCID ) 20 MG tablet, Take 1 tablet (20 mg total) by mouth 2 (two) times daily., Disp: 180 tablet, Rfl: 3   lactulose  (CHRONULAC ) 10 GM/15ML solution, Take 15 mLs (10 g total) by mouth 2 (two) times daily. Please hold if more than 2-3 loose BMs per day, Disp: 473 mL, Rfl: 0   Multiple Vitamin (MULTIVITAMIN WITH MINERALS) TABS tablet, Take 1 tablet by mouth daily., Disp: , Rfl:    sulfamethoxazole -trimethoprim  (BACTRIM ) 400-80 MG tablet, Take 1 tablet by mouth daily., Disp: 30 tablet, Rfl: 11   thiamine  (VITAMIN B1) 100 MG tablet, Take 1  tablet (100 mg total) by mouth daily., Disp: 90 tablet, Rfl: 3   ROS: All other ros negative     Objective:   Physical Exam Constitutional:      Appearance: He is well-developed.  HENT:     Head: Normocephalic and atraumatic.  Eyes:     Conjunctiva/sclera: Conjunctivae normal.  Cardiovascular:     Rate and Rhythm: Normal rate and regular rhythm.  Pulmonary:     Effort: Pulmonary effort is normal. No respiratory distress.     Breath sounds: No wheezing.  Abdominal:     General: There is no distension.  Palpations: Abdomen is soft.  Musculoskeletal:        General: No tenderness. Normal range of motion.     Cervical back: Normal range of motion and neck supple.  Skin:    General: Skin is warm and dry.     Coloration: Skin is not pale.     Findings: No erythema or rash.  Neurological:     General: No focal deficit present.     Mental Status: He is alert and oriented to person, place, and time.  Psychiatric:        Mood and Affect: Mood normal.        Behavior: Behavior normal.        Thought Content: Thought content normal.        Judgment: Judgment normal.     Labs: Lab Results  Component Value Date   WBC 6.4 12/02/2023   HGB 11.4 (L) 12/02/2023   HCT 36.8 (L) 12/02/2023   MCV 88.7 12/02/2023   PLT 404 (H) 12/02/2023   Last metabolic panel Lab Results  Component Value Date   GLUCOSE 92 12/02/2023   NA 138 12/02/2023   K 4.0 12/02/2023   CL 103 12/02/2023   CO2 25 12/02/2023   BUN 6 (L) 12/02/2023   CREATININE 0.80 12/02/2023   GFR 123.17 11/07/2023   CALCIUM  9.4 12/02/2023   PHOS 3.1 10/27/2023   PROT 7.2 12/02/2023   ALBUMIN  3.7 11/07/2023   BILITOT 1.0 12/02/2023   ALKPHOS 144 (H) 11/07/2023   AST 32 12/02/2023   ALT 32 12/02/2023   ANIONGAP 7 10/27/2023   Hiv: Lab Results  Component Value Date   HIV1RNAQUANT <20 DETECTED (A) 12/02/2023   Lab Results  Component Value Date   CD4TCELL 14 (L) 10/21/2023   CD4TABS 170 (L) 10/21/2023          Assessment & Plan:   Problem List Items Addressed This Visit   None Visit Diagnoses       Human immunodeficiency virus (HIV) disease (HCC)    -  Primary   Relevant Orders   HIV-1 RNA quant-no reflex-bld   COMPLETE METABOLIC PANEL WITHOUT GFR   CBC with Differential/Platelet   T-helper cells (CD4) count (not at [facility])   RPR   C. trachomatis/N. gonorrhoeae RNA   CT/NG RNA, TMA Rectal   GC/CT Probe, Amp (Throat)     Polypharmacy         Screening for venereal disease (VD)       Relevant Orders   RPR   C. trachomatis/N. gonorrhoeae RNA   CT/NG RNA, TMA Rectal   GC/CT Probe, Amp (Throat)     Encounter for immunization       Relevant Orders   Flu vaccine trivalent PF, 6mos and older(Flulaval,Afluria,Fluarix,Fluzone) (Completed)   Pfizer Comirnaty Covid-19 Vaccine 108yrs & older (Completed)       #hiv Dx'ed 2016 Msm Cd4 nadir 90s in 2023 Hx OI none Genotype -- 2023 no PI or nrti/nnrti resistance mutation Therapy: Started on triumeq  then transitioned to biktarvy  Off meds 2024-mid 2025; reengaged 10/2023 admitted to [place] for etoh hepatitis and found to have thrush/proctocolitis   03/26/24 only 2 missed doses last 4 months. Asked about weight gain but agreeable to trying to incorporate modified acitvities/exercise. If no change in the next 6 months we could consider trying something else. Several options discussed, potentially dovato   -discussed u=u -encourage compliance -continue current HIV medication biktarvy  -labs today -f/u in 6 months   #hx anal fistula  2016 Per epic chart review -- was seeing dr Bernarda Ned at some point but self resolved 03/26/24 no perirectal sx No need for anal pap at this time   #hx syphilis Rpr serofast titer 1:1 to 1:2 since 2018 Sexualy active as of 03/2024  Rpr titer and triple gc/chlam screen today 03/26/2024   #hcm -tb Quantiferon gold negative 07/2020 -hepatitis Prior hep b sAb positive/passive immune;  repeat titer 71 on 11/2023 -std screening 03/26/24 rpr titer repeat and triple screen -cancer screening If rectal issue remains will send screen for anal cancer          Constance ONEIDA Passer, MD [facility] [phone]  pager   [phone] cell 03/26/2024, 10:07 AM

## 2024-03-26 NOTE — Patient Instructions (Signed)
 Let's revisit in 6 months

## 2024-03-27 LAB — C. TRACHOMATIS/N. GONORRHOEAE RNA
C. trachomatis RNA, TMA: NOT DETECTED
N. gonorrhoeae RNA, TMA: NOT DETECTED

## 2024-03-30 LAB — CBC WITH DIFFERENTIAL/PLATELET
Absolute Lymphocytes: 1829 {cells}/uL (ref 850–3900)
Absolute Monocytes: 741 {cells}/uL (ref 200–950)
Basophils Absolute: 48 {cells}/uL (ref 0–200)
Basophils Relative: 0.7 %
Eosinophils Absolute: 218 {cells}/uL (ref 15–500)
Eosinophils Relative: 3.2 %
HCT: 43.4 % (ref 38.5–50.0)
Hemoglobin: 14 g/dL (ref 13.2–17.1)
MCH: 24.8 pg — ABNORMAL LOW (ref 27.0–33.0)
MCHC: 32.3 g/dL (ref 32.0–36.0)
MCV: 77 fL — ABNORMAL LOW (ref 80.0–100.0)
MPV: 10.8 fL (ref 7.5–12.5)
Monocytes Relative: 10.9 %
Neutro Abs: 3964 {cells}/uL (ref 1500–7800)
Neutrophils Relative %: 58.3 %
Platelets: 403 Thousand/uL — ABNORMAL HIGH (ref 140–400)
RBC: 5.64 Million/uL (ref 4.20–5.80)
RDW: 13.9 % (ref 11.0–15.0)
Total Lymphocyte: 26.9 %
WBC: 6.8 Thousand/uL (ref 3.8–10.8)

## 2024-03-30 LAB — RPR TITER: RPR Titer: 1:1 {titer} — ABNORMAL HIGH

## 2024-03-30 LAB — T PALLIDUM AB: T Pallidum Abs: POSITIVE — AB

## 2024-03-30 LAB — COMPLETE METABOLIC PANEL WITHOUT GFR
AG Ratio: 1.3 (calc) (ref 1.0–2.5)
ALT: 30 U/L (ref 9–46)
AST: 24 U/L (ref 10–40)
Albumin: 4.3 g/dL (ref 3.6–5.1)
Alkaline phosphatase (APISO): 136 U/L — ABNORMAL HIGH (ref 36–130)
BUN: 11 mg/dL (ref 7–25)
CO2: 27 mmol/L (ref 20–32)
Calcium: 9.9 mg/dL (ref 8.6–10.3)
Chloride: 101 mmol/L (ref 98–110)
Creat: 0.86 mg/dL (ref 0.60–1.26)
Globulin: 3.3 g/dL (ref 1.9–3.7)
Glucose, Bld: 104 mg/dL — ABNORMAL HIGH (ref 65–99)
Potassium: 4.8 mmol/L (ref 3.5–5.3)
Sodium: 136 mmol/L (ref 135–146)
Total Bilirubin: 0.7 mg/dL (ref 0.2–1.2)
Total Protein: 7.6 g/dL (ref 6.1–8.1)

## 2024-03-30 LAB — T-HELPER CELLS (CD4) COUNT (NOT AT ARMC)
Absolute CD4: 394 {cells}/uL — ABNORMAL LOW (ref 490–1740)
CD4 T Helper %: 21 % — ABNORMAL LOW (ref 30–61)
Total lymphocyte count: 1834 {cells}/uL (ref 850–3900)

## 2024-03-30 LAB — HIV-1 RNA QUANT-NO REFLEX-BLD
HIV 1 RNA Quant: 34 {copies}/mL — ABNORMAL HIGH
HIV-1 RNA Quant, Log: 1.53 {Log_copies}/mL — ABNORMAL HIGH

## 2024-03-30 LAB — CT/NG RNA, TMA RECTAL

## 2024-03-30 LAB — RPR: RPR Ser Ql: REACTIVE — AB

## 2024-03-30 LAB — GC/CHLAMYDIA PROBE, AMP (THROAT)
Chlamydia trachomatis RNA: NOT DETECTED
Neisseria gonorrhoeae RNA: NOT DETECTED

## 2024-09-24 ENCOUNTER — Ambulatory Visit: Payer: PRIVATE HEALTH INSURANCE | Admitting: Internal Medicine
